# Patient Record
Sex: Female | Born: 1955 | Race: White | Hispanic: No | Smoking: Former smoker
Health system: Southern US, Community
[De-identification: ages and names within clinical notes are randomized; demographics above are authoritative.]

## PROBLEM LIST (undated history)

## (undated) DIAGNOSIS — Z972 Presence of dental prosthetic device (complete) (partial): Secondary | ICD-10-CM

## (undated) DIAGNOSIS — B1710 Acute hepatitis C without hepatic coma: Secondary | ICD-10-CM

## (undated) DIAGNOSIS — L570 Actinic keratosis: Secondary | ICD-10-CM

## (undated) DIAGNOSIS — F329 Major depressive disorder, single episode, unspecified: Secondary | ICD-10-CM

## (undated) DIAGNOSIS — Z8614 Personal history of Methicillin resistant Staphylococcus aureus infection: Secondary | ICD-10-CM

## (undated) DIAGNOSIS — G4700 Insomnia, unspecified: Secondary | ICD-10-CM

## (undated) DIAGNOSIS — F17210 Nicotine dependence, cigarettes, uncomplicated: Secondary | ICD-10-CM

## (undated) DIAGNOSIS — M810 Age-related osteoporosis without current pathological fracture: Secondary | ICD-10-CM

## (undated) DIAGNOSIS — I5189 Other ill-defined heart diseases: Secondary | ICD-10-CM

## (undated) DIAGNOSIS — M1990 Unspecified osteoarthritis, unspecified site: Secondary | ICD-10-CM

## (undated) DIAGNOSIS — Z9889 Other specified postprocedural states: Secondary | ICD-10-CM

## (undated) DIAGNOSIS — G5602 Carpal tunnel syndrome, left upper limb: Secondary | ICD-10-CM

## (undated) DIAGNOSIS — I4510 Unspecified right bundle-branch block: Secondary | ICD-10-CM

## (undated) DIAGNOSIS — R112 Nausea with vomiting, unspecified: Secondary | ICD-10-CM

## (undated) DIAGNOSIS — R413 Other amnesia: Secondary | ICD-10-CM

## (undated) DIAGNOSIS — R251 Tremor, unspecified: Secondary | ICD-10-CM

## (undated) DIAGNOSIS — M1711 Unilateral primary osteoarthritis, right knee: Secondary | ICD-10-CM

## (undated) DIAGNOSIS — E039 Hypothyroidism, unspecified: Secondary | ICD-10-CM

## (undated) DIAGNOSIS — K219 Gastro-esophageal reflux disease without esophagitis: Secondary | ICD-10-CM

## (undated) DIAGNOSIS — F32A Depression, unspecified: Secondary | ICD-10-CM

## (undated) DIAGNOSIS — R0600 Dyspnea, unspecified: Secondary | ICD-10-CM

## (undated) HISTORY — DX: Gastro-esophageal reflux disease without esophagitis: K21.9

## (undated) HISTORY — DX: Hypothyroidism, unspecified: E03.9

## (undated) HISTORY — DX: Insomnia, unspecified: G47.00

## (undated) HISTORY — DX: Depression, unspecified: F32.A

## (undated) HISTORY — DX: Actinic keratosis: L57.0

## (undated) HISTORY — PX: HAND SURGERY: SHX662

## (undated) HISTORY — PX: OTHER SURGICAL HISTORY: SHX169

## (undated) HISTORY — DX: Tremor, unspecified: R25.1

## (undated) HISTORY — PX: TOTAL KNEE ARTHROPLASTY: SHX125

## (undated) HISTORY — DX: Other amnesia: R41.3

## (undated) HISTORY — DX: Age-related osteoporosis without current pathological fracture: M81.0

## (undated) HISTORY — PX: ANKLE SURGERY: SHX546

## (undated) HISTORY — PX: TONSILLECTOMY: SUR1361

## (undated) HISTORY — DX: Major depressive disorder, single episode, unspecified: F32.9

## (undated) HISTORY — DX: Acute hepatitis C without hepatic coma: B17.10

---

## 1983-08-16 DIAGNOSIS — B1920 Unspecified viral hepatitis C without hepatic coma: Secondary | ICD-10-CM

## 1983-08-16 HISTORY — DX: Unspecified viral hepatitis C without hepatic coma: B19.20

## 2004-07-30 ENCOUNTER — Emergency Department: Payer: Self-pay | Admitting: Emergency Medicine

## 2004-08-12 ENCOUNTER — Ambulatory Visit: Payer: Self-pay | Admitting: Gastroenterology

## 2005-01-27 ENCOUNTER — Ambulatory Visit: Payer: Self-pay | Admitting: Unknown Physician Specialty

## 2005-03-02 ENCOUNTER — Encounter: Payer: Self-pay | Admitting: Unknown Physician Specialty

## 2005-03-15 ENCOUNTER — Encounter: Payer: Self-pay | Admitting: Unknown Physician Specialty

## 2005-04-15 ENCOUNTER — Encounter: Payer: Self-pay | Admitting: Unknown Physician Specialty

## 2006-09-15 ENCOUNTER — Emergency Department: Payer: Self-pay | Admitting: Internal Medicine

## 2007-03-19 ENCOUNTER — Ambulatory Visit: Payer: Self-pay | Admitting: Physician Assistant

## 2009-04-07 ENCOUNTER — Ambulatory Visit: Payer: Self-pay | Admitting: Adult Health

## 2010-05-13 ENCOUNTER — Ambulatory Visit: Payer: Self-pay

## 2010-05-26 ENCOUNTER — Ambulatory Visit: Payer: Self-pay

## 2010-07-12 ENCOUNTER — Emergency Department: Payer: Self-pay | Admitting: Emergency Medicine

## 2011-10-16 ENCOUNTER — Emergency Department: Payer: Self-pay | Admitting: Unknown Physician Specialty

## 2011-10-25 ENCOUNTER — Ambulatory Visit: Payer: Self-pay | Admitting: Orthopedic Surgery

## 2011-10-25 LAB — URINALYSIS, COMPLETE
Glucose,UR: NEGATIVE mg/dL (ref 0–75)
Nitrite: NEGATIVE
Protein: NEGATIVE
Specific Gravity: 1.004 (ref 1.003–1.030)

## 2011-10-25 LAB — CBC
MCH: 31.2 pg (ref 26.0–34.0)
MCV: 93 fL (ref 80–100)
Platelet: 222 10*3/uL (ref 150–440)
RBC: 3.75 10*6/uL — ABNORMAL LOW (ref 3.80–5.20)
RDW: 12.5 % (ref 11.5–14.5)
WBC: 7 10*3/uL (ref 3.6–11.0)

## 2011-10-25 LAB — BASIC METABOLIC PANEL
Anion Gap: 7 (ref 7–16)
BUN: 10 mg/dL (ref 7–18)
Calcium, Total: 8.9 mg/dL (ref 8.5–10.1)
Co2: 31 mmol/L (ref 21–32)
EGFR (African American): >60
Potassium: 4.2 mmol/L (ref 3.5–5.1)
Sodium: 139 mmol/L (ref 136–145)

## 2011-10-25 LAB — PROTIME-INR
INR: 0.9
Prothrombin Time: 12.6 secs (ref 11.5–14.7)

## 2011-10-25 LAB — SEDIMENTATION RATE: Erythrocyte Sed Rate: 21 mm/hr (ref 0–30)

## 2011-10-25 LAB — APTT: Activated PTT: 30.7 secs (ref 23.6–35.9)

## 2011-10-26 ENCOUNTER — Ambulatory Visit: Payer: Self-pay | Admitting: Orthopedic Surgery

## 2011-10-28 ENCOUNTER — Observation Stay: Payer: Self-pay | Admitting: Orthopedic Surgery

## 2011-12-18 ENCOUNTER — Emergency Department: Payer: Self-pay | Admitting: Emergency Medicine

## 2012-01-03 ENCOUNTER — Ambulatory Visit: Payer: Self-pay | Admitting: Family Medicine

## 2012-01-10 ENCOUNTER — Encounter: Payer: Self-pay | Admitting: Cardiothoracic Surgery

## 2012-01-10 ENCOUNTER — Encounter: Payer: Self-pay | Admitting: Nurse Practitioner

## 2012-01-28 ENCOUNTER — Emergency Department: Payer: Self-pay | Admitting: Internal Medicine

## 2012-01-28 ENCOUNTER — Emergency Department: Payer: Self-pay | Admitting: Unknown Physician Specialty

## 2012-01-29 LAB — COMPREHENSIVE METABOLIC PANEL
Alkaline Phosphatase: 111 U/L (ref 50–136)
BUN: 16 mg/dL (ref 7–18)
Bilirubin,Total: 0.2 mg/dL (ref 0.2–1.0)
Chloride: 105 mmol/L (ref 98–107)
Co2: 28 mmol/L (ref 21–32)
Creatinine: 0.7 mg/dL (ref 0.60–1.30)
EGFR (African American): >60
Glucose: 101 mg/dL — ABNORMAL HIGH (ref 65–99)
SGPT (ALT): 21 U/L

## 2012-01-29 LAB — CBC WITH DIFFERENTIAL/PLATELET
Basophil #: 0 10*3/uL (ref 0.0–0.1)
Basophil %: 0.3 %
Eosinophil %: 1.9 %
HCT: 35.6 % (ref 35.0–47.0)
HGB: 11.7 g/dL — ABNORMAL LOW (ref 12.0–16.0)
Lymphocyte #: 2.2 10*3/uL (ref 1.0–3.6)
Monocyte #: 0.6 x10 3/mm (ref 0.2–0.9)
Monocyte %: 9 %
Neutrophil %: 57.4 %
RBC: 3.85 10*6/uL (ref 3.80–5.20)
RDW: 13.5 % (ref 11.5–14.5)
WBC: 7 10*3/uL (ref 3.6–11.0)

## 2012-01-31 ENCOUNTER — Ambulatory Visit: Payer: Self-pay | Admitting: Orthopedic Surgery

## 2012-01-31 LAB — URINALYSIS, COMPLETE
Bilirubin,UR: NEGATIVE
Blood: NEGATIVE
Glucose,UR: NEGATIVE mg/dL (ref 0–75)
Leukocyte Esterase: NEGATIVE
Ph: 7 (ref 4.5–8.0)
RBC,UR: <1 /HPF (ref 0–5)
Specific Gravity: 1.019 (ref 1.003–1.030)
Squamous Epithelial: 4

## 2012-01-31 LAB — CBC
HCT: 34.8 % — ABNORMAL LOW (ref 35.0–47.0)
MCH: 30.2 pg (ref 26.0–34.0)
WBC: 4.1 10*3/uL (ref 3.6–11.0)

## 2012-01-31 LAB — BASIC METABOLIC PANEL
Anion Gap: 6 — ABNORMAL LOW (ref 7–16)
Calcium, Total: 9 mg/dL (ref 8.5–10.1)
Chloride: 102 mmol/L (ref 98–107)
Co2: 29 mmol/L (ref 21–32)
EGFR (Non-African Amer.): >60
Glucose: 85 mg/dL (ref 65–99)

## 2012-01-31 LAB — PROTIME-INR: Prothrombin Time: 13 secs (ref 11.5–14.7)

## 2012-02-01 ENCOUNTER — Ambulatory Visit: Payer: Self-pay | Admitting: Orthopedic Surgery

## 2012-02-02 LAB — WOUND CULTURE

## 2012-02-03 LAB — CULTURE, BLOOD (SINGLE)

## 2012-02-17 ENCOUNTER — Inpatient Hospital Stay: Payer: Self-pay | Admitting: Orthopedic Surgery

## 2012-02-17 LAB — CBC WITH DIFFERENTIAL/PLATELET
Basophil #: 0 10*3/uL (ref 0.0–0.1)
Eosinophil #: 0.1 10*3/uL (ref 0.0–0.7)
HCT: 32.1 % — ABNORMAL LOW (ref 35.0–47.0)
Lymphocyte #: 2.8 10*3/uL (ref 1.0–3.6)
MCH: 29.9 pg (ref 26.0–34.0)
MCV: 90 fL (ref 80–100)
Monocyte #: 0.5 x10 3/mm (ref 0.2–0.9)
Monocyte %: 8.1 %
Neutrophil #: 3.1 10*3/uL (ref 1.4–6.5)
Neutrophil %: 47.4 %
Platelet: 302 10*3/uL (ref 150–440)
RDW: 13.7 % (ref 11.5–14.5)
WBC: 6.6 10*3/uL (ref 3.6–11.0)

## 2012-02-17 LAB — SEDIMENTATION RATE: Erythrocyte Sed Rate: 85 mm/hr — ABNORMAL HIGH (ref 0–30)

## 2012-02-17 LAB — BASIC METABOLIC PANEL
BUN: 13 mg/dL (ref 7–18)
Chloride: 106 mmol/L (ref 98–107)
EGFR (African American): >60
EGFR (Non-African Amer.): >60
Glucose: 91 mg/dL (ref 65–99)
Osmolality: 277 (ref 275–301)

## 2012-02-19 LAB — CBC WITH DIFFERENTIAL/PLATELET
Basophil %: 0.6 %
Eosinophil %: 5.1 %
HCT: 33.2 % — ABNORMAL LOW (ref 35.0–47.0)
HGB: 10.9 g/dL — ABNORMAL LOW (ref 12.0–16.0)
Lymphocyte #: 2.3 10*3/uL (ref 1.0–3.6)
MCH: 29.7 pg (ref 26.0–34.0)
MCV: 90 fL (ref 80–100)
Monocyte #: 0.5 x10 3/mm (ref 0.2–0.9)
Monocyte %: 9.7 %
Neutrophil #: 1.9 10*3/uL (ref 1.4–6.5)
Platelet: 271 10*3/uL (ref 150–440)
RBC: 3.67 10*6/uL — ABNORMAL LOW (ref 3.80–5.20)
RDW: 13.6 % (ref 11.5–14.5)
WBC: 4.9 10*3/uL (ref 3.6–11.0)

## 2012-02-19 LAB — VANCOMYCIN, TROUGH: Vancomycin, Trough: 24 ug/mL (ref 10–20)

## 2012-03-15 ENCOUNTER — Other Ambulatory Visit: Payer: Self-pay | Admitting: Orthopedic Surgery

## 2012-03-15 LAB — SEDIMENTATION RATE: Erythrocyte Sed Rate: 41 mm/hr — ABNORMAL HIGH (ref 0–30)

## 2012-03-15 LAB — CBC WITH DIFFERENTIAL/PLATELET
Basophil #: 0 10*3/uL (ref 0.0–0.1)
Eosinophil %: 4.2 %
HGB: 11.7 g/dL — ABNORMAL LOW (ref 12.0–16.0)
Lymphocyte #: 2.1 10*3/uL (ref 1.0–3.6)
MCH: 30.3 pg (ref 26.0–34.0)
Monocyte #: 0.6 x10 3/mm (ref 0.2–0.9)
Monocyte %: 9.3 %
Neutrophil %: 53.6 %
Platelet: 257 10*3/uL (ref 150–440)
RBC: 3.86 10*6/uL (ref 3.80–5.20)
RDW: 14.4 % (ref 11.5–14.5)
WBC: 6.5 10*3/uL (ref 3.6–11.0)

## 2012-05-01 ENCOUNTER — Ambulatory Visit: Payer: Self-pay | Admitting: Orthopedic Surgery

## 2012-05-01 LAB — CBC
HCT: 37.5 % (ref 35.0–47.0)
HGB: 12.7 g/dL (ref 12.0–16.0)
MCH: 30.5 pg (ref 26.0–34.0)
MCHC: 33.8 g/dL (ref 32.0–36.0)
Platelet: 266 10*3/uL (ref 150–440)
WBC: 6.4 10*3/uL (ref 3.6–11.0)

## 2012-05-01 LAB — BASIC METABOLIC PANEL
Anion Gap: 7 (ref 7–16)
BUN: 15 mg/dL (ref 7–18)
Calcium, Total: 8.9 mg/dL (ref 8.5–10.1)
Chloride: 106 mmol/L (ref 98–107)
Glucose: 108 mg/dL — ABNORMAL HIGH (ref 65–99)
Osmolality: 277 (ref 275–301)
Potassium: 4.2 mmol/L (ref 3.5–5.1)
Sodium: 138 mmol/L (ref 136–145)

## 2012-05-01 LAB — APTT: Activated PTT: 32.5 secs (ref 23.6–35.9)

## 2012-05-01 LAB — PROTIME-INR: Prothrombin Time: 13.1 secs (ref 11.5–14.7)

## 2012-05-01 LAB — SEDIMENTATION RATE: Erythrocyte Sed Rate: 36 mm/hr — ABNORMAL HIGH (ref 0–30)

## 2012-05-04 ENCOUNTER — Inpatient Hospital Stay: Payer: Self-pay | Admitting: Orthopedic Surgery

## 2012-05-05 LAB — SEDIMENTATION RATE: Erythrocyte Sed Rate: 31 mm/hr — ABNORMAL HIGH (ref 0–30)

## 2012-05-07 LAB — CREATININE, SERUM
EGFR (African American): >60
EGFR (Non-African Amer.): >60

## 2012-05-08 LAB — CBC WITH DIFFERENTIAL/PLATELET
Basophil %: 0.6 %
Eosinophil #: 0.5 10*3/uL (ref 0.0–0.7)
Eosinophil %: 9.7 %
HCT: 30.9 % — ABNORMAL LOW (ref 35.0–47.0)
HGB: 10.6 g/dL — ABNORMAL LOW (ref 12.0–16.0)
Lymphocyte %: 41 %
MCHC: 34.2 g/dL (ref 32.0–36.0)
MCV: 90 fL (ref 80–100)
Neutrophil #: 2.2 10*3/uL (ref 1.4–6.5)
Neutrophil %: 39.7 %
RBC: 3.45 10*6/uL — ABNORMAL LOW (ref 3.80–5.20)

## 2012-05-08 LAB — WOUND CULTURE

## 2012-05-28 ENCOUNTER — Ambulatory Visit: Payer: Self-pay | Admitting: Specialist

## 2012-06-12 ENCOUNTER — Other Ambulatory Visit: Payer: Self-pay | Admitting: Orthopedic Surgery

## 2012-06-21 ENCOUNTER — Encounter: Payer: Self-pay | Admitting: Nurse Practitioner

## 2012-06-21 ENCOUNTER — Encounter: Payer: Self-pay | Admitting: Cardiothoracic Surgery

## 2012-07-15 ENCOUNTER — Encounter: Payer: Self-pay | Admitting: Nurse Practitioner

## 2012-07-15 ENCOUNTER — Encounter: Payer: Self-pay | Admitting: Cardiothoracic Surgery

## 2012-07-27 ENCOUNTER — Ambulatory Visit: Payer: Self-pay

## 2012-08-15 ENCOUNTER — Encounter: Payer: Self-pay | Admitting: Cardiothoracic Surgery

## 2012-08-15 ENCOUNTER — Encounter: Payer: Self-pay | Admitting: Nurse Practitioner

## 2013-01-09 IMAGING — NM NUCLEAR MEDICINE THREE PHASE BONE SCAN
1 of 2 series · 2 of 14 positions shown, 3 images · non-contrast
Comparison: none

REASON FOR EXAM: part 2
COMMENTS:

[Series 3: ac bone 5.0 b08s · axial · 0.98mm/px · z∈[+202,+572]mm · 2 of 75 slices shown, 3 images]
[im 1/75  soft-tissue]
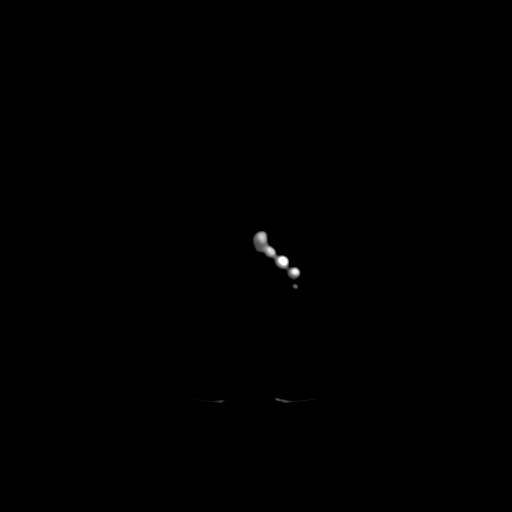
[im 1/75  bone]
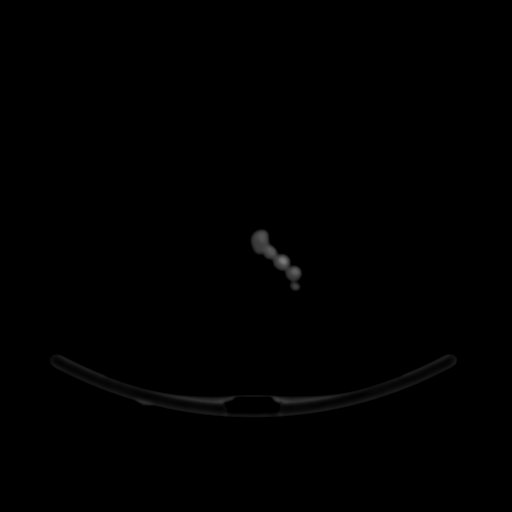
[im 75/75  bone]
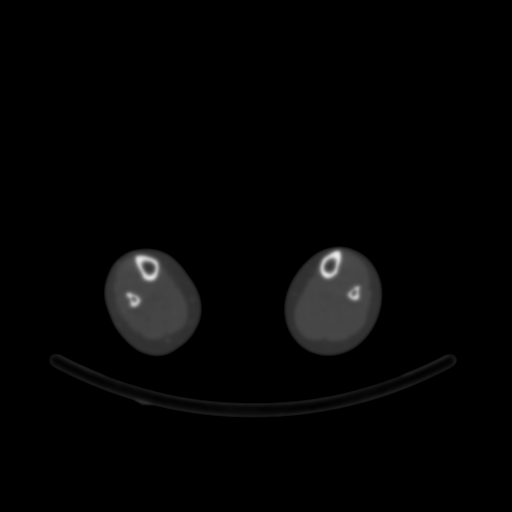

[2 of 14 positions shown; findings below may reference images not displayed]

PROCEDURE:     NM  - NM BONE IMAGING 3 PHASE STUDY  - February 18, 2012  [DATE]

RESULT:     Clinical History: Right ankle infection

Procedure: Following the intravenous administration of 21.607 mCi of Tc 99m
MDP, dynamic flow, blood pool, and three hour delayed images of bilateral
feet were obtained.
FINDINGS: There is increased radiotracer uptake in the soft tissues and
bone during the flow phase within the right ankle. There is increased
osseous radiotracer uptake within the distal right fibula and right tibia
during the flow phase. On the delayed images there is intense radiotracer
uptake focally within the distal fibular diaphysis which is the site of a
known fracture. There is increased rate tracer uptake within the distal
tibia which is the site of known fracture. There is orthopedic hardware
present along the lateral aspect of the distal fibula and the medial
malleolus.
IMPRESSION: There is increased radiotracer uptake within the soft tissues of the right
ankle as can be seen with cellulitis. Correlate with clinical exam.

There is increased radiotracer uptake focally within the distal fibular
diaphysis and within the distal tibia and medial malleolus which are sites
of fractures status post internal fixation. The increased radiotracer uptake
may be secondary to the fracture itself, but superimposed infection cannot
be entirely excluded. If there is further clinical concern a tagged white
blood cell study is recommended.

[REDACTED]

## 2013-07-08 ENCOUNTER — Ambulatory Visit: Payer: Self-pay | Admitting: Family Medicine

## 2013-07-25 ENCOUNTER — Encounter: Payer: Self-pay | Admitting: Podiatry

## 2013-07-29 ENCOUNTER — Ambulatory Visit (INDEPENDENT_AMBULATORY_CARE_PROVIDER_SITE_OTHER): Payer: BC Managed Care – PPO

## 2013-07-29 ENCOUNTER — Encounter: Payer: Self-pay | Admitting: Podiatry

## 2013-07-29 ENCOUNTER — Ambulatory Visit (INDEPENDENT_AMBULATORY_CARE_PROVIDER_SITE_OTHER): Payer: BC Managed Care – PPO | Admitting: Podiatry

## 2013-07-29 VITALS — BP 112/70 | HR 106 | Resp 16 | Ht 66.0 in | Wt 175.0 lb

## 2013-07-29 DIAGNOSIS — M79672 Pain in left foot: Secondary | ICD-10-CM

## 2013-07-29 DIAGNOSIS — M7750 Other enthesopathy of unspecified foot: Secondary | ICD-10-CM

## 2013-07-29 DIAGNOSIS — M79609 Pain in unspecified limb: Secondary | ICD-10-CM

## 2013-07-29 DIAGNOSIS — M109 Gout, unspecified: Secondary | ICD-10-CM

## 2013-07-29 MED ORDER — METHYLPREDNISOLONE (PAK) 4 MG PO TABS: ORAL_TABLET | ORAL | Status: DC

## 2013-07-29 NOTE — Progress Notes (Signed)
   Subjective:    Patient ID: [name] [name], female    DOB: 1956/07/02, 57 y.o.   MRN: [id number]  HPI Comments:  N PAIN/SWELLING L LEFT FOOT -ARCH/ANKLE INTO LEG D 3 WEEKS O SUDDEN C SAME A AFTER SITTING T HAD BLOOD WORK FOR GOUT-NEGATIVE, X-RAYS, CORTISONE INJ-DR MILES   Foot Pain      Review of Systems  Constitutional: Negative.   HENT:       SINUS PROBLEMS  Eyes: Positive for visual disturbance.  Cardiovascular: Negative.   Gastrointestinal: Negative.   Endocrine: Negative.   Genitourinary: Positive for urgency.  Musculoskeletal:       JOINT PAIN  Skin: Negative.   Allergic/Immunologic: Negative.   Neurological: Negative.   Hematological: Negative.   Psychiatric/Behavioral: Negative.        Objective:   Physical Exam: I have reviewed her past medical history medications allergies surgical history social history family history and review of systems. Vital signs are stable she is alert and oriented x3. Pulses are strongly palpable bilateral. Neurologic sensorium is intact per Semmes-Weinstein monofilament. Deep tendon reflexes are brisk and equal bilateral. Muscle strength + over 5 dorsiflexors plantar flexors inverters everters all intrinsic musculature is intact. Orthopedic evaluation demonstrates all joints distal to the ankle a full range of motion without crepitus she has mild pes planus left foot with overlying edema no erythema cellulitis drainage or odor she has pain on palpation with a boggy edema overlying the sinus tarsi and lateral ankle tenderness is also noted here as well as the medial ankle along the posterior tibial tendon. Radiographically I am unable to visualize any fractures at this point. No osteopenia is present and the fracture would not be out of the question. The foot is warm and swollen to touch not consistent with simple fracture particularly this far out. Cutaneous evaluation demonstrates supple well hydrated cutis.        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  Plan: I started her on prednisone today. I put her in a compression anklet and a Cam Walker boot which alleviate her symptomatology immediately.

## 2013-08-01 ENCOUNTER — Telehealth: Payer: Self-pay | Admitting: *Deleted

## 2013-08-01 NOTE — Telephone Encounter (Signed)
Pt called wanting to know if she should stop taking aspirin due to her gout. Per dr. Al Corpus yes stop taking aspirin. L/m on pts v/mail stating this.

## 2013-08-05 ENCOUNTER — Telehealth: Payer: Self-pay | Admitting: *Deleted

## 2013-08-05 NOTE — Telephone Encounter (Signed)
Pt called said her foot was still swollen and was hurting wanted to know if she needed to cont. Wearing the boot. L/m stating to wear boot and to call to make appt if still in pain.

## 2013-08-12 ENCOUNTER — Encounter: Payer: Self-pay | Admitting: Podiatry

## 2013-08-12 ENCOUNTER — Ambulatory Visit (INDEPENDENT_AMBULATORY_CARE_PROVIDER_SITE_OTHER): Payer: BC Managed Care – PPO | Admitting: Podiatry

## 2013-08-12 VITALS — BP 111/73 | HR 108 | Resp 16 | Ht 66.0 in | Wt 170.0 lb

## 2013-08-12 DIAGNOSIS — M7750 Other enthesopathy of unspecified foot: Secondary | ICD-10-CM

## 2013-08-12 DIAGNOSIS — M778 Other enthesopathies, not elsewhere classified: Secondary | ICD-10-CM

## 2013-08-12 NOTE — Progress Notes (Signed)
She presents today states that her foot is doing about the same really hasn't gotten any better. She's been wearing her compression anklet took her anti-inflammatories and has been wearing her Cam Walker. She denies fever chills nausea vomiting muscle aches or pains.  Objective: Vital signs are stable she is alert and oriented x3 no erythema does mild edema no cellulitis drainage or odor she does have pain on palpation of the sinus tarsi and on range of motion of the subtalar joint particularly odor sharp inversion.  Assessment: Subtalar joint capsulitis left foot.  Plan: Continue all conservative therapies. We did inject the subtalar joint today with Kenalog and local anesthetic followup with her in one month at which time if she is no better an MRI will be indicated.

## 2013-09-02 ENCOUNTER — Ambulatory Visit (INDEPENDENT_AMBULATORY_CARE_PROVIDER_SITE_OTHER): Payer: No Typology Code available for payment source | Admitting: Podiatry

## 2013-09-02 ENCOUNTER — Encounter: Payer: Self-pay | Admitting: Podiatry

## 2013-09-02 VITALS — BP 136/89 | HR 84 | Resp 18

## 2013-09-02 DIAGNOSIS — M779 Enthesopathy, unspecified: Principal | ICD-10-CM

## 2013-09-02 DIAGNOSIS — M778 Other enthesopathies, not elsewhere classified: Secondary | ICD-10-CM

## 2013-09-02 DIAGNOSIS — M7750 Other enthesopathy of unspecified foot: Secondary | ICD-10-CM

## 2013-09-02 DIAGNOSIS — M109 Gout, unspecified: Secondary | ICD-10-CM

## 2013-09-02 MED ORDER — METHYLPREDNISOLONE (PAK) 4 MG PO TABS: ORAL_TABLET | ORAL | Status: DC

## 2013-09-02 NOTE — Progress Notes (Signed)
Two days of relief after the cortisone injection than right back to ground zero.  Objective: Vital signs are stable she is alert and oriented x3. Pain on palpation to the extensor digitorum brevis muscle belly which is warm on palpation.  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  Plan: Discussed etiology pathology conservative surgical therapies at this point we're going to have blood work drawn looking for an elevated uric acid and I injected her periarticularly about the fourth fifth metatarsocuboid articulation. And wrote a prescription for a Medrol Dosepak.

## 2013-09-03 ENCOUNTER — Telehealth: Payer: Self-pay | Admitting: *Deleted

## 2013-09-03 LAB — SEDIMENTATION RATE: Sed Rate: 2 mm/hr (ref 0–40)

## 2013-09-03 LAB — URIC ACID: Uric Acid: 2.7 mg/dL (ref 2.5–7.1)

## 2013-09-03 LAB — ANA: Anti Nuclear Antibody(ANA): NEGATIVE

## 2013-09-03 LAB — C-REACTIVE PROTEIN: CRP: 0.7 mg/L (ref 0.0–4.9)

## 2013-09-03 LAB — RHEUMATOID FACTOR: Rhuematoid fact SerPl-aCnc: 9.4 IU/mL (ref 0.0–13.9)

## 2013-09-03 NOTE — Telephone Encounter (Signed)
Left message about her bloodwork being normal to call with any questions

## 2013-09-09 ENCOUNTER — Telehealth: Payer: Self-pay | Admitting: *Deleted

## 2013-09-09 ENCOUNTER — Other Ambulatory Visit: Payer: Self-pay | Admitting: Podiatry

## 2013-09-09 DIAGNOSIS — M109 Gout, unspecified: Secondary | ICD-10-CM

## 2013-09-09 MED ORDER — INDOMETHACIN 50 MG PO CAPS: 50.0000 mg | ORAL_CAPSULE | Freq: Two times a day (BID) | ORAL | Status: DC

## 2013-09-09 NOTE — Telephone Encounter (Signed)
Pt called stating she has gout in both feet/toes. Wants a rx for allopurinol or something.

## 2013-09-09 NOTE — Telephone Encounter (Signed)
Sakara, Lehtinen Female [birth year] [id number] [place] [id number] [phone] (Home)              Message Received: Today     [place]plan[place], DPM Dennie Fetters            I have ordered indocin and is at the pharm. If the pain just started with in the last couple of days then I suggest blood work for uric acid. Last uric acid was low thus not gout. If we can catch it high then we could start allopurinol.      Per dr Milinda Pointer send pt out for uric acid. Indomethacin was sent over to pharmacy

## 2013-09-10 LAB — URIC ACID: URIC ACID: 2.8 mg/dL (ref 2.5–7.1)

## 2013-09-11 ENCOUNTER — Telehealth: Payer: Self-pay | Admitting: *Deleted

## 2013-09-11 NOTE — Telephone Encounter (Signed)
Pt called wanting to know if she could start taking her mobic and aspirin again. Per dr Milinda Pointer yes can start back taking those rx. Also i asked pt to call me back regarding appt with dr Precious Reel to let me know a good day/time to set up her appt.

## 2013-09-11 NOTE — Telephone Encounter (Signed)
Message copied by Laury Axon on Wed Lue 28, 2015 11:39 AM ------      Message from: Tyson Dense T      Created: Tue Kentrell 27, 2015  1:18 PM       Inform [name] that this is obviously not gout with a uric acid reading of 2.8.  I think she needs to see a rheumatologist.  See about sending her to Dr. Maylon Peppers. ------

## 2013-09-11 NOTE — Telephone Encounter (Signed)
Called and left message asking pt to call me back regarding uric acid. i stated labs came back and no gout, dr Milinda Pointer wants to send her to rheumatology to see dr Aletta Edouard. Asked if she would call me back to let me know a good day and time for her so i can set up appt.

## 2013-09-16 ENCOUNTER — Ambulatory Visit: Payer: No Typology Code available for payment source | Admitting: Podiatry

## 2013-09-30 ENCOUNTER — Ambulatory Visit: Payer: No Typology Code available for payment source | Admitting: Podiatry

## 2013-11-12 ENCOUNTER — Emergency Department (HOSPITAL_COMMUNITY)
Admission: EM | Admit: 2013-11-12 | Discharge: 2013-11-12 | Disposition: A | Discharge status: Home / Self Care | Payer: No Typology Code available for payment source | Attending: Emergency Medicine | Admitting: Emergency Medicine

## 2013-11-12 ENCOUNTER — Encounter (HOSPITAL_COMMUNITY): Payer: Self-pay | Admitting: Emergency Medicine

## 2013-11-12 DIAGNOSIS — R5383 Other fatigue: Secondary | ICD-10-CM

## 2013-11-12 DIAGNOSIS — B1920 Unspecified viral hepatitis C without hepatic coma: Secondary | ICD-10-CM | POA: Insufficient documentation

## 2013-11-12 DIAGNOSIS — K219 Gastro-esophageal reflux disease without esophagitis: Secondary | ICD-10-CM | POA: Insufficient documentation

## 2013-11-12 DIAGNOSIS — F329 Major depressive disorder, single episode, unspecified: Secondary | ICD-10-CM | POA: Insufficient documentation

## 2013-11-12 DIAGNOSIS — R5381 Other malaise: Secondary | ICD-10-CM | POA: Insufficient documentation

## 2013-11-12 DIAGNOSIS — Z79899 Other long term (current) drug therapy: Secondary | ICD-10-CM | POA: Insufficient documentation

## 2013-11-12 DIAGNOSIS — R002 Palpitations: Secondary | ICD-10-CM | POA: Insufficient documentation

## 2013-11-12 DIAGNOSIS — R000 Tachycardia, unspecified: Secondary | ICD-10-CM | POA: Insufficient documentation

## 2013-11-12 DIAGNOSIS — F3289 Other specified depressive episodes: Secondary | ICD-10-CM | POA: Insufficient documentation

## 2013-11-12 DIAGNOSIS — Z87891 Personal history of nicotine dependence: Secondary | ICD-10-CM | POA: Insufficient documentation

## 2013-11-12 DIAGNOSIS — R112 Nausea with vomiting, unspecified: Secondary | ICD-10-CM | POA: Insufficient documentation

## 2013-11-12 DIAGNOSIS — R42 Dizziness and giddiness: Secondary | ICD-10-CM | POA: Insufficient documentation

## 2013-11-12 LAB — I-STAT CHEM 8, ED
BUN: 10 mg/dL (ref 6–23)
CALCIUM ION: 1.19 mmol/L (ref 1.12–1.23)
Chloride: 103 mEq/L (ref 96–112)
Creatinine, Ser: 0.9 mg/dL (ref 0.50–1.10)
Glucose, Bld: 105 mg/dL — ABNORMAL HIGH (ref 70–99)
HEMATOCRIT: 41 % (ref 36.0–46.0)
Hemoglobin: 13.9 g/dL (ref 12.0–15.0)
POTASSIUM: 4.4 meq/L (ref 3.7–5.3)
Sodium: 143 mEq/L (ref 137–147)
TCO2: 27 mmol/L (ref 0–100)

## 2013-11-12 MED ORDER — ACETAMINOPHEN 325 MG PO TABS
650.0000 mg | ORAL_TABLET | Freq: Once | ORAL | Status: AC
  Administered 2013-11-12: 650 mg via ORAL
  Filled 2013-11-12: qty 2

## 2013-11-12 NOTE — ED Notes (Signed)
Per [facility] pt lives at home but called 911 from home. Pt called 911 due to "fast heart rate." Pt stated to EMS she never drinks anything but coffee, pt denies any pain. Pt reports feeling nauseated, vomited x1, and dizziness, pt had a HR of 129, BP 134/90, no change with OVS. 20 G LAC, pt received 250 CC NS administered.

## 2013-11-12 NOTE — ED Notes (Signed)
Pt st's she started feeling nauseated while at work.  She then checked her B/P which was 145/83.  Pt st's she felt like her heart was racing but did not have any chest pain.  At this time pt st's she just feels tired and slightly nauseated.  Pt alert and oriented x 3, skin warm and dry color appropriate.  Pt also c/o lower back pain, denies injury.

## 2013-11-13 NOTE — ED Provider Notes (Signed)
I saw and evaluated the patient, reviewed the resident's note and I agree with the findings and plan.   EKG Interpretation   Date/Time:  Tuesday November 12 2013 17:23:31 EDT Ventricular Rate:  103 PR Interval:  143 QRS Duration: 84 QT Interval:  348 QTC Calculation: 455 R Axis:   51 Text Interpretation:  Sinus tachycardia Probable left atrial enlargement  Low voltage, precordial leads Probable anteroseptal infarct, old No old  tracing to compare Confirmed by Seana Underwood  MD, [place]uto [place] ([id number]) on 11/12/2013  7:43:53 PM       Palpitations likely from her stimulant use. No significant abnormality on labs. HR improved here. D/C.  Ephraim Hamburger, MD 11/13/13 1420

## 2013-11-13 NOTE — ED Provider Notes (Signed)
CSN: [id number]     Arrival date & time 11/12/13  1703 History   First MD Initiated Contact with Patient 11/12/13 1707     Chief Complaint  Patient presents with  . Palpitations    Patient is a 58 y.o. female presenting with palpitations.  Palpitations Palpitations quality:  Fast Onset quality:  Sudden Timing:  Constant Progression:  Resolved (resolved spontaneously while in ambulance on way to hospital) Chronicity:  New Context: caffeine (she reports that she drinks at least 1 pot of coffee daily.  she had about 4-5 cups of coffee this morning just before onset of her symptoms.)   Context: not bronchodilators, not exercise, not illicit drugs and not nicotine   Relieved by:  Nothing Exacerbated by: nothing. Ineffective treatments:  None tried Associated symptoms: dizziness ("lightheaded"), malaise/fatigue, nausea and vomiting (X1, non-bloody, non-bilious)   Associated symptoms: no back pain, no chest pain, no cough, no diaphoresis, no leg pain, no lower extremity edema, no near-syncope, no numbness, no orthopnea, no PND, no shortness of breath and no syncope   Nausea:    Severity:  Mild   Progression:  Resolved Risk factors: hx of thyroid disease (she last had her thyroid function tests checked by her PCP 3 months ago and was told they were normal.  she has been taking her levothyroxine as prescribed.) and hyperthyroidism (remotely, now hypothyroid.)   Risk factors: no diabetes mellitus, no heart disease, no hx of atrial fibrillation, no hx of DVT and no hx of PE   Risk factors comment:  She has never had SVT or other arrhythmia.  she has never had palpitations, syncope, CHF, MI, or other cardiac disease.   Past Medical History  Diagnosis Date  . Depression   . Insomnia   . Acute hepatitis C without mention of hepatic coma   . GERD (gastroesophageal reflux disease)    Past Surgical History  Procedure Laterality Date  . Ankle surgery Right   . Bladder tact    . Tonsillectomy     . Hand surgery Right    No family history on file. History  Substance Use Topics  . Smoking status: Former Research scientist (life sciences)  . Smokeless tobacco: Never Used  . Alcohol Use: No   OB History   Grav Para Term Preterm Abortions TAB SAB Ect Mult Living                 Review of Systems  Constitutional: Positive for malaise/fatigue and fatigue. Negative for fever, chills and diaphoresis.  HENT: Negative for congestion and rhinorrhea.   Respiratory: Negative for cough, shortness of breath and wheezing.   Cardiovascular: Positive for palpitations. Negative for chest pain, orthopnea, leg swelling, syncope, PND and near-syncope.  Gastrointestinal: Positive for nausea and vomiting (X1, non-bloody, non-bilious). Negative for abdominal pain and diarrhea.  Genitourinary: Negative for dysuria, urgency, frequency, flank pain, vaginal bleeding, vaginal discharge and difficulty urinating.  Musculoskeletal: Negative for back pain, neck pain and neck stiffness.  Skin: Negative for rash.  Neurological: Positive for dizziness ("lightheaded"). Negative for weakness, numbness and headaches.  All other systems reviewed and are negative.      Allergies  Sonata  Home Medications   Current Outpatient Rx  Name  Route  Sig  Dispense  Refill  . amitriptyline (ELAVIL) 50 MG tablet   Oral   Take 50 mg by mouth. 3 by mouth nightly at bedtime         . buPROPion (WELLBUTRIN SR) 150 MG 12 hr tablet  Oral   Take 150 mg by mouth 2 (two) times daily.          . calcium carbonate (OS-CAL) 600 MG TABS tablet   Oral   Take 600 mg by mouth 2 (two) times daily with a meal.         . docusate sodium (COLACE) 100 MG capsule   Oral   Take 100 mg by mouth 2 (two) times daily. 1 by mouth TID         . ibandronate (BONIVA) 150 MG tablet   Oral   Take 150 mg by mouth every 30 (thirty) days. Take in the morning with a full glass of water, on an empty stomach, and do not take anything else by mouth or lie down  for the next 30 min.         Marland Kitchen levothyroxine (SYNTHROID, LEVOTHROID) 50 MCG tablet   Oral   Take 50 mcg by mouth daily before breakfast.         . Multiple Vitamins-Minerals (MULTIVITAL-M) TABS   Oral   Take by mouth once.         Marland Kitchen omeprazole (PRILOSEC) 20 MG capsule   Oral   Take 20 mg by mouth daily.         Marland Kitchen tretinoin (RETIN-A) 0.05 % cream   Topical   Apply 0.05 % topically at bedtime.         Marland Kitchen venlafaxine (EFFEXOR) 75 MG tablet   Oral   Take 75 mg by mouth 3 (three) times daily with meals.          BP 131/91  Pulse 106  Temp(Src) 98.2 F (36.8 C) (Oral)  Resp 24  SpO2 97% Physical Exam  Nursing note and vitals reviewed. Constitutional: She is oriented to person, place, and time. She appears well-developed and well-nourished. No distress.  Well appearing.  No stigmata of Graves disease.  HENT:  Head: Normocephalic and atraumatic.  Mouth/Throat: Oropharynx is clear and moist.  Eyes: Conjunctivae and EOM are normal. Pupils are equal, round, and reactive to light. No scleral icterus.  No exophthalmos  Neck: Normal range of motion. Neck supple. No thyromegaly present.  Cardiovascular: Regular rhythm, normal heart sounds and intact distal pulses.  Exam reveals no gallop and no friction rub.   No murmur heard. Borderline tachycardia, 90s to low 100s.  Pulmonary/Chest: Effort normal and breath sounds normal. No respiratory distress. She has no wheezes. She has no rales.  Abdominal: Soft. Bowel sounds are normal. She exhibits no distension. There is no tenderness. There is no rebound and no guarding.  Musculoskeletal: She exhibits no edema.  Neurological: She is alert and oriented to person, place, and time. She has normal reflexes. No cranial nerve deficit. She exhibits normal muscle tone. Coordination normal.  Skin: Skin is warm and dry. No rash noted. She is not diaphoretic.    ED Course  Procedures (including critical care time) Labs Review Labs  Reviewed  I-STAT CHEM 8, ED - Abnormal; Notable for the following:    Glucose, Bld 105 (*)    All other components within normal limits   Imaging Review No results found.   EKG Interpretation   Date/Time:  Tuesday November 12 2013 17:23:31 EDT Ventricular Rate:  103 PR Interval:  143 QRS Duration: 84 QT Interval:  348 QTC Calculation: 455 R Axis:   51 Text Interpretation:  Sinus tachycardia Probable left atrial enlargement  Low voltage, precordial leads Probable anteroseptal infarct, old No old  tracing to compare Confirmed by GOLDSTON  MD, [place] [phone]) on 11/12/2013  7:43:53 PM      MDM   58 year old female with no cardiac history presents complaining of palpitations. Started several hours go home. She states that her heart started to race very fast. She felt a little bit nauseated. She had one episode of nonbloody nonbilious emesis. She did not have any chest pain. She denied any syncope. She took her pulse at home and found it to be in the 140s. She called EMS. EMS found her heart rate to be in the 120s with sinus tachycardia.  Patient has never had similar symptoms before.  In the emergency department, she is afebrile, well-appearing, has mild tachycardia to 100 at presentation with improvement to the 90s with no intervention. She had no arrhythmia on cardiac monitoring. She was monitored in the ED for a period of time with no further symptoms. On further history she tells me that she drinks a pot of coffee every day. She had 4 cups of coffee prior to her symptoms today. She denies nicotine use. Denies any illicit drug use or stimulant use other than the caffeine. She's never had a failed. She's never had SVT. She has no history of congestive heart failure. She's never had syncope. She has had no chest pain with this event today. She has a normal cardiopulmonary exam. No peripheral edema. She has a history of thyroid disorder, had her third function test checked 3 months ago and they  were normal.  EKG here demonstrates sinus tachycardia, normal PR, QRS, QTC. No WPW. No bruit. No ischemic changes.  Chem-8 demonstrates normal creatinine, normal electrolytes, normal hemoglobin.  The patient is stable for discharge. I think that her palpitations are most likely related to a very large amount of caffeine consumption. I advised her to minimize caffeine consumption. She is to follow up with her primary care physician to discuss further. She's given emergency Department return precautions.  Final diagnoses:  Palpitations       Wendall Papa, MD 11/13/13 1251

## 2014-01-27 ENCOUNTER — Ambulatory Visit (INDEPENDENT_AMBULATORY_CARE_PROVIDER_SITE_OTHER): Payer: No Typology Code available for payment source | Admitting: Podiatry

## 2014-01-27 ENCOUNTER — Encounter: Payer: Self-pay | Admitting: Podiatry

## 2014-01-27 DIAGNOSIS — M79609 Pain in unspecified limb: Secondary | ICD-10-CM

## 2014-01-27 DIAGNOSIS — M779 Enthesopathy, unspecified: Principal | ICD-10-CM

## 2014-01-27 DIAGNOSIS — L608 Other nail disorders: Secondary | ICD-10-CM

## 2014-01-27 DIAGNOSIS — M778 Other enthesopathies, not elsewhere classified: Secondary | ICD-10-CM

## 2014-01-27 DIAGNOSIS — M7750 Other enthesopathy of unspecified foot: Secondary | ICD-10-CM

## 2014-01-27 DIAGNOSIS — M79672 Pain in left foot: Secondary | ICD-10-CM

## 2014-01-27 NOTE — Progress Notes (Signed)
She presents today for followup of pain to the dorsal lateral aspect of the left foot. She states it primarily bothers her as she walks downstairs. Going upstairs really doesn't bother her that much a general walking she does have pain and she points to the dorsal lateral aspect of the left foot. The last time she was in I injected the left foot which did not alleviate any of her symptoms. She's also complaining of thick nails and nail discoloration.  Objective: Pulses are strongly palpable left foot. She has pain on palpation overlying the fourth fifth metatarsocuboid articulation. Her nails are thick yellow dystrophic.  Assessment: Nail dystrophy care rule out onychomycosis. Chronic intractable pain fifth metatarsal fourth metatarsal articulation left foot.  Plan: Samples of the nails were taken today and were sent for review. We are also going to obtain an MRI of her left foot suspicious of a rupture ligament left fourth fifth met articulation of the cuboid.

## 2014-01-28 ENCOUNTER — Telehealth: Payer: Self-pay | Admitting: *Deleted

## 2014-01-28 NOTE — Telephone Encounter (Signed)
Called and spoke with pt letting her know MRI at [facility] is 6.25.15 arrival time 1:45 for a 2:15 mri. Pt understood.

## 2014-01-30 ENCOUNTER — Encounter: Payer: Self-pay | Admitting: *Deleted

## 2014-01-30 NOTE — Progress Notes (Signed)
Josem Kaufmann #[id number] for mri scheduled 6.25.15 good thru 7.25.15.

## 2014-02-06 ENCOUNTER — Ambulatory Visit: Payer: Self-pay | Admitting: Podiatry

## 2014-02-07 ENCOUNTER — Telehealth: Payer: Self-pay | Admitting: *Deleted

## 2014-02-07 NOTE — Telephone Encounter (Signed)
Called to let pt know dr Milinda Pointer wanted to send her mri out for a re-read. Will call when get results. Pt understood.

## 2014-02-11 ENCOUNTER — Other Ambulatory Visit: Payer: Self-pay | Admitting: *Deleted

## 2014-02-11 MED ORDER — NUVAIL EX SOLN: CUTANEOUS | Status: DC

## 2014-02-11 NOTE — Telephone Encounter (Signed)
Spoke with pt letting her know pathology report was negative for fungus. Per dr Milinda Pointer can remove toenail or try nuvail. Per pt she wants to try nuvail. [facility]

## 2014-02-12 ENCOUNTER — Encounter: Payer: Self-pay | Admitting: Podiatry

## 2014-02-14 ENCOUNTER — Telehealth: Payer: Self-pay | Admitting: *Deleted

## 2014-02-14 NOTE — Telephone Encounter (Signed)
Patient called wanting to know her mri results  i explained to patient that the results were not yet back will call her as soon as they come in

## 2014-02-24 ENCOUNTER — Ambulatory Visit: Payer: No Typology Code available for payment source | Admitting: Podiatry

## 2014-02-24 ENCOUNTER — Telehealth: Payer: Self-pay | Admitting: Podiatry

## 2014-02-24 ENCOUNTER — Encounter: Payer: Self-pay | Admitting: Podiatry

## 2014-02-24 NOTE — Telephone Encounter (Signed)
OPENED IN ERROR

## 2014-02-26 ENCOUNTER — Ambulatory Visit (INDEPENDENT_AMBULATORY_CARE_PROVIDER_SITE_OTHER): Payer: No Typology Code available for payment source | Admitting: Podiatry

## 2014-02-26 ENCOUNTER — Encounter: Payer: Self-pay | Admitting: Podiatry

## 2014-02-26 VITALS — BP 116/73 | HR 86 | Resp 12

## 2014-02-26 DIAGNOSIS — M779 Enthesopathy, unspecified: Principal | ICD-10-CM

## 2014-02-26 DIAGNOSIS — M778 Other enthesopathies, not elsewhere classified: Secondary | ICD-10-CM

## 2014-02-26 DIAGNOSIS — M129 Arthropathy, unspecified: Secondary | ICD-10-CM

## 2014-02-26 DIAGNOSIS — M7750 Other enthesopathy of unspecified foot: Secondary | ICD-10-CM

## 2014-02-26 DIAGNOSIS — M1990 Unspecified osteoarthritis, unspecified site: Secondary | ICD-10-CM

## 2014-02-26 MED ORDER — CELECOXIB 200 MG PO CAPS: 200.0000 mg | ORAL_CAPSULE | Freq: Every day | ORAL | Status: DC

## 2014-02-26 NOTE — Progress Notes (Signed)
She presents today for followup of her MRI left foot. She states that the toes still sore.  Objective: Vital signs are stable she is alert and oriented x3 pulses are palpable bilateral. 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  Assessment: Capsulitis/osteoarthritis left foot.  Plan: Started her on Celebrex 200 mg 1 by mouth daily. She was also scanned for a set of orthotics.

## 2014-03-07 ENCOUNTER — Encounter: Payer: Self-pay | Admitting: *Deleted

## 2014-03-07 NOTE — Progress Notes (Signed)
Sent pt post card letting her know orthotics are here and left voicemail on phone.

## 2014-03-12 ENCOUNTER — Encounter: Payer: Self-pay | Admitting: *Deleted

## 2014-03-12 ENCOUNTER — Ambulatory Visit (INDEPENDENT_AMBULATORY_CARE_PROVIDER_SITE_OTHER): Payer: No Typology Code available for payment source | Admitting: *Deleted

## 2014-03-12 VITALS — BP 111/73 | HR 96 | Resp 16

## 2014-03-12 DIAGNOSIS — M779 Enthesopathy, unspecified: Principal | ICD-10-CM

## 2014-03-12 DIAGNOSIS — M7750 Other enthesopathy of unspecified foot: Secondary | ICD-10-CM

## 2014-03-12 DIAGNOSIS — M778 Other enthesopathies, not elsewhere classified: Secondary | ICD-10-CM

## 2014-03-12 NOTE — Patient Instructions (Signed)

## 2014-03-12 NOTE — Progress Notes (Signed)
Pt presents today to pick up orthotics. Went over wearing instructions with pt.

## 2014-04-14 ENCOUNTER — Ambulatory Visit: Payer: No Typology Code available for payment source | Admitting: Podiatry

## 2014-04-30 ENCOUNTER — Ambulatory Visit (INDEPENDENT_AMBULATORY_CARE_PROVIDER_SITE_OTHER): Payer: No Typology Code available for payment source | Admitting: Podiatry

## 2014-04-30 VITALS — BP 113/72 | HR 88 | Resp 16

## 2014-04-30 DIAGNOSIS — B351 Tinea unguium: Secondary | ICD-10-CM

## 2014-04-30 DIAGNOSIS — M129 Arthropathy, unspecified: Secondary | ICD-10-CM

## 2014-04-30 DIAGNOSIS — M1990 Unspecified osteoarthritis, unspecified site: Secondary | ICD-10-CM

## 2014-04-30 MED ORDER — TAVABOROLE 5 % EX SOLN: CUTANEOUS | Status: DC

## 2014-04-30 NOTE — Progress Notes (Signed)
She presents today for followup of her osteoarthritis of the use of her orthotics she states is doing better with the orthotics and the arthritis has improved that she has good days and bad days still. She also states that she would like to see about getting a topical antifungal to apply to her toenails.  Objective: Vital signs are stable she is alert and oriented x3. No change in physical findings from previous exam. Onychodystrophy and onychomycosis is present.  Assessment: Onychomycosis and osteoarthritis.  Plan: Started her on topical antifungal and she will continue the use of orthotics.

## 2014-10-09 ENCOUNTER — Ambulatory Visit: Payer: Self-pay | Admitting: Psychiatry

## 2014-11-19 ENCOUNTER — Ambulatory Visit
Admit: 2014-11-19 | Disposition: A | Discharge status: Home / Self Care | Payer: Self-pay | Attending: Psychiatry | Admitting: Psychiatry

## 2014-12-02 NOTE — Op Note (Signed)
PATIENT NAME:  [name] [name], [name] [name] MR#:  [id number] DATE OF BIRTH:  05/26/1956  DATE OF PROCEDURE:  05/28/2012  PREOPERATIVE DIAGNOSES:  1. Need for long-term intravenous therapy.  2. Nonfunctional and partially removed left arm PICC line.   POSTOPERATIVE DIAGNOSES:  1. Need for long-term intravenous therapy.  2. Nonfunctional and partially removed left arm PICC line.   PROCEDURE: Removal and replacement through same venous access in the left arm peripherally inserted central venous catheter using fluoroscopic guidance.   SURGEON: Leotis Pain, MD   ANESTHESIA: Local.   ESTIMATED BLOOD LOSS: Minimal.   CONTRAST USED: None.  FLUOROSCOPY TIME: One minute.   INDICATION FOR PROCEDURE: This individual had a PICC line placed previously. This has been pulled well out and is no longer in an acceptable location. She comes in for replacement of this.   DESCRIPTION OF PROCEDURE: The patient was brought to the vascular and interventional radiology suite. The left upper extremity was sterilely prepped and draped as was the existing PICC line. An 0.018 wire was used to rewire the existing PICC line and that PICC line was removed. A new PICC line was placed over the wire, parked in the superior vena cava, and the wire was removed. The catheter withdrew blood well and flushed easily with sterile saline.      This is a single lumen PICC line. It was secured to the skin at 43 cm.   ____________________________ Algernon Huxley, MD jsd:bjt D: 05/28/2012 15:18:44 ET T: 05/28/2012 15:35:08 ET JOB#: [id number]  cc: Algernon Huxley, MD, <Dictator> Algernon Huxley MD ELECTRONICALLY SIGNED 06/04/2012 13:09

## 2014-12-02 NOTE — Consult Note (Signed)
Impression: 59yo WF w/ h/o right ankle fracture, s/p plate and screw placement complicated by loosening and subsequent removal of two screws who was admitted with possible hardware infection.  A prior CT showed a possible fluid collection.  She was treated with a round of doxycycline after her last discharge.  She has continued to have inflammation around the site.  She has had all the hardware removed and the fracture has healed. I would favor treating her as though the hardware was infected to minimize the chance of developing osteomyelitis which may compromise the recently healed fracture. Continue vanco.  Will await the intraop cultures.  Prior culture in 01/2012 grew Methacillin Sensitive Staph aureus, but does not appear to be a surgical specimen. Will plan on having a PICC line placed on Monday.  Will decide on antibiotics depending on the culture results. 5) Will check ESR and CRP in am.  Electronic Signatures: Lacrystal Barbe, Heinz Knuckles (MD)  (Signed on 20-Sep-13 15:21)  Authored  Last Updated: 20-Sep-13 15:21 by Teea Ducey, Heinz Knuckles (MD)
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
## 2014-12-02 NOTE — Consult Note (Signed)
PATIENT NAME:  [name] [name], SONS MR#:  [id number] DATE OF BIRTH:  03-24-1956  DATE OF CONSULTATION:  05/04/2012  REFERRING PHYSICIAN:  Timoteo Gaul, MD  CONSULTING PHYSICIAN:  Heinz Knuckles. Press Casale, MD  REASON FOR CONSULTATION: Possible infected orthopedic hardware.   HISTORY OF PRESENT ILLNESS: The patient is a 59 year old white female with a past history significant for a right ankle fracture, status post plate and screw placement which was complicated by loosening and subsequent removal of two screws, and who has had evidence for local skin infection, who was admitted with redness and swelling of the area over the hardware. The patient was admitted today and was brought to the OR where she underwent removal of the hardware. Cultures of the wound and the bone and soft tissues were sent for culture. She had been having several days of pains and swelling with some redness from around her ankle. She did not have significant purulent drainage. In the past, she has had cultures of the ankle that have grown methicillin sensitive Staphylococcus aureus, although these appeared to be more superficial cultures then surgical cultures. At times when she has had the screws removed, cultures have been negative. She was recently hospitalized in July. At that time there was concern on CAT scan of possible osteomyelitis, however, it was not definitive. 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 She was treated with oral therapy and had some improvement, however the redness returned. She denies any fevers, chills, or sweats. She has been ambulating without significant difficulty. She does state that since the surgery she has been having pain in the right ankle. A sedimentation rate from July 5th was 104, and on August 1st was 11, and on September 17th was 36.0.  C-reactive protein was 11.7 on July 5th, 10.8 on August 1st, and 4.5 on September 17th. Her white count was normal on September  17th.   ALLERGIES: None.   PAST MEDICAL HISTORY: Right ankle fracture status post ORIF complicated by loosening of screws with subsequent removal and infection that was treated with a short course of oral therapy.   FAMILY HISTORY: Noncontributory.   SOCIAL HISTORY: The patient lives by herself. 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 She does not drink. No history of injecting drug use.  FAMILY HISTORY: Positive for hypertension, myocardial infarction and a brain tumor.   REVIEW OF SYSTEMS: GENERAL: No fevers, chills, or sweats. HEENT: No headaches. No sinus congestion. No sore throat. NECK: No stiffness. No swollen glands. RESPIRATORY: No cough, no shortness of breath, no sputum production. CARDIAC: No chest pains or palpitations. GI: No nausea, no vomiting, no abdominal pain. GU: No change in her urine. MUSCULOSKELETAL: She has pain in her right ankle related to her recent surgery. Prior to that, she had been having some redness and swelling but no drainage from the ankle. She was also having pain in the ankle. SKIN: No rashes other than the right ankle. NEUROLOGIC: No focal weakness. PSYCHIATRIC: No complaints. All other systems are negative.   PHYSICAL EXAMINATION:  VITAL SIGNS: T-max 97.9, pulse 98, blood pressure 104/64, 96% on room air.   GENERAL: A 59 year old white female in no acute distress.   HEENT: Normocephalic, atraumatic. Pupils are equal, reactive to light. Extraocular motion appears to be intact. Sclerae, conjunctivae, and lids are without evidence for emboli or petechiae. Oropharynx shows lips and gums appear to be normal.   NECK: Midline trachea. No lymphadenopathy. No thyromegaly.  LUNGS: Clear to auscultation bilaterally with good air movement. No focal consolidation.   HEART: Regular rate and rhythm without murmur, rub, or gallop.   ABDOMEN: Soft, nontender, and nondistended. No hepatosplenomegaly. No hernia is noted.   EXTREMITIES: No evidence for  tenosynovitis.   SKIN: No rashes. The right ankle was wrapped in bandages postoperatively and was not directly observed. There was no edema present above the bandage, and there was no evidence for lymphangitic streaking or redness above the bandage.   SKIN: The patient is without stigmata of endocarditis, specifically no Janeway lesions nor Osler nodes.   NEUROLOGIC: The patient is awake and interactive, moving all four extremities.   PSYCHIATRIC: Mood and affect appeared normal.   LABORATORY, DIAGNOSTIC AND RADIOLOGICAL DATA: BUN 15, creatinine 0.72, bicarbonate 25, anion gap is 7.0. White count of 6.4 with a hemoglobin of 12.7, platelet count of 266, and a sedimentation rate of 36. These were all obtained on September 17th preadmission. C-reactive protein at that time was 4.5. Wound cultures from today showed a Gram stain with small gram-positive cocci. Cultures are currently pending. Bone scan from July 6th showed increased uptake in the soft tissues of the right ankle thought to be due to cellulitis. There is also some uptake in the fibular diatheses and within the distal tibia and medial malleolus where the sites of fracture were. Superimposed infection could not be excluded, however, a CT scan of the right ankle with contrast from July 5th showed soft tissue edema on the lateral aspect of the ankle and foot. There was a 1.7 x 0.7 cm fluid collection. No more recent radiographic imaging is available for review.   IMPRESSION: A 59 year old white female with a history of right ankle fracture, status post plate and screw placement complicated by loosening and subsequent removal of two screws, who is admitted with possible hardware infection.   RECOMMENDATIONS: A prior CT showed possible fluid collection. She was treated with a round of doxycycline after her last discharge. She has continued to have inflammation around the site. She had all the hardware removed and the fracture has healed totally.    1. I would favor treating her as though the hardware was infected to minimize the chance of developing osteomyelitis, which may compromise the recently healed fracture.  2. I would continue vancomycin. I will await the intraoperative cultures. Prior cultures in June 2013 grew methicillin-sensitive Staphylococcus aureus, but this does not appear to be a surgical specimen.  3. I will plan on having a PICC line placed on Monday. I will decide on antibiotics depending on the culture results. I will likely treat for six weeks.  4. I will check a sedimentation rate and C-reactive protein in the morning post removal of the hardware.   This is a moderately complex Infectious Disease consultation. Thank you very much for involving me in Ms. Finnell's care.   ____________________________ Heinz Knuckles. Jawanza Zambito, MD meb:cbb D: 05/04/2012 15:30:51 ET T: 05/04/2012 18:53:31 ET JOB#: [id number] Oseph Imburgia E Afton Lavalle MD ELECTRONICALLY SIGNED 05/08/2012 9:24

## 2014-12-02 NOTE — Op Note (Signed)
PATIENT NAME:  [name] [name], [name] [name] MR#:  [id number] DATE OF BIRTH:  06-05-1956  DATE OF PROCEDURE:  05/04/2012  PREOPERATIVE DIAGNOSIS: Right ankle painful hardware.   POSTOPERATIVE DIAGNOSIS: Painful right ankle hardware with abscess.   OPERATION: Removal of hardware with irrigation and debridement of wound abscess.   SURGEON: Timoteo Gaul, MD   ANESTHESIA: General.   SPECIMENS: Swab of abscess and bone and soft tissue samples x2 to Microbiology.   ESTIMATED BLOOD LOSS: 100 mL.   COMPLICATIONS: None.   INDICATIONS FOR PROCEDURE: The patient is a 59 year old female who had undergone an open reduction internal fixation of a right bimalleolar ankle fracture six months previously. She returned recently to the clinic with increasing swelling and redness in the right ankle and pain over the lateral ankle. X-rays in the office demonstrated the fracture was healed and it was felt the patient would benefit from hardware removal.   I reviewed the risks and benefits of surgery with the patient. She understood the risks include infection, bleeding, nerve or blood vessel injury, fracture, persistent pain, and the need for further surgery including ankle arthrodesis. Medical complications include DVT and pulmonary embolism, myocardial infarction, stroke, pneumonia, respiratory failure, and death.   PROCEDURE NOTE: The patient was brought to the operating room where she was placed supine on the operative table. All bony prominences were adequately padded. She underwent general endotracheal intubation. She was prepped and draped in a sterile fashion.   A time-out was performed to verify the patient's name, date of birth, medical record number, correct site of surgery, and correct procedure to be performed. It was also used to verify that all appropriate instruments and radiographic studies were available in the room. Once all in attendance were in agreement, the case began. Because of a suspicion for  possible infection, antibiotics were held at the onset of the case.   A mini FluoroScan was used to identify the underlying plate and screws. A lateral incision was made with a #15 blade. The original incision both medially and laterally were utilized. As the incision was extended proximally and soft tissue dissection performed, an abscess was encountered with purulent drainage. Swab was taken of this material and sent to the lab for stat Gram stain. The soft tissue was then dissected carefully to avoid injury to the superficial peroneal nerve. The underlying plate and screws were then cleared of overlying soft tissue. The screws were removed by using screwdriver by hand. Once all screws and plate were removed, the wound was copiously irrigated. Small bone samples were taken from the proximal and distalmost screw holes. Curette was used to remove any fibrous tissue from the holes in the bone at the screw sites. All soft tissue film from beneath the plate was also curetted. The fracture was well healed and there was no displacement. The patient then had 6 liters of pulse irrigation impregnated with neomycin in the lateral wound. The abscess lining was debrided.   The attention was then turned medially. New instruments were used to avoid cross-contamination. A medial incision was made using the previous incision site. A single cannulated screw and a 3.5 bicortical screw were identified and removed by hand. A small arthrotomy was made into the anterior joint from the medial side and copiously irrigated again with neomycin impregnated saline. Medial side was also pulse irrigated. The medial side was then closed with 2-0 PDS and skin staples. The lateral side was also closed with 2-0 PDS and the skin approximated with skin  staples and 4-0 nylon. A dry sterile dressing was applied along with an AO splint. The culture swab and bone and soft tissue samples from the lateral wound were sent to Microbiology for stat Gram  stain and culture and sensitivity. The patient was then awakened and brought to the PAC-U in stable condition. I was scrubbed and present for the entire case and all sharp and instrument counts were correct at the conclusion of the case.   I spoke with the patient's ex-husband who had brought her to the hospital and let him know that the case had gone without complication. Based on the findings of the OR, the patient was admitted to the hospital for IV antibiotics and Infectious Disease consultation called from the PAC-U. I personally spoke with Dr. Clayborn Bigness who has agreed to see the patient and make antibiotic recommendations.   ____________________________ Timoteo Gaul, MD klk:drc D: 05/07/2012 17:44:54 ET T: 05/07/2012 18:06:23 ET JOB#: [id number] cc: Timoteo Gaul, MD, <Dictator> Timoteo Gaul MD ELECTRONICALLY SIGNED 05/12/2012 13:12

## 2014-12-02 NOTE — Discharge Summary (Signed)
PATIENT NAME:  [name] [name], [name] [name] MR#:  [id number] DATE OF BIRTH:  28-May-1956  DATE OF ADMISSION:  02/17/2012 DATE OF DISCHARGE:  02/20/2012  HISTORY OF PRESENT ILLNESS: [name] [name] is a 59 year old female who had presented earlier in the day to my office with swelling and erythema over the right ankle. She had recently had two screws which loosened in the ankle which were removed through a small incision. This incision had slight serous drainage from it and surrounding erythema. She had tenderness over this area as well. She was admitted for treatment of postoperative cellulitis.   HOSPITAL COURSE: The patient was admitted on 02/17/2012. She was placed on vancomycin and Zosyn for broad coverage for antibiotics. Infectious Disease was consulted. Dr. Clayborn Bigness had seen the patient. She had a CT scan showing a possible small amount of fluid just superficial to the plate. She also had a bone scan showing soft tissue signal as well as increased uptake in the bimalleolar. There was no obvious evidence for any significant bone loss or osteomyelitis. The patient's was placed on strict bedrest and elevation of the right lower extremity. The patient was compliant with this plan, and she clinically improved over the next several days to the point where there was no further drainage. Swelling had dramatically decreased with compressive wraps, and the erythema had resolved. It was recommended by Dr. Clayborn Bigness that the patient be discharged on doxycycline for 10 days. Given the patient's clinical improvement, she was prepared for discharge.   DISCHARGE INSTRUCTIONS: I gave the patient is strict instructions to continue strict bedrest and elevation at home. She will complete her doxycycline course. She may resume all home medications. She will continue compressive wraps with an Ace wrap and TED hose. She will follow-up me in 10 days for re-evaluation in the clinic. She will call me immediately if she has worsening erythema, edema,  or drainage.  ____________________________ Timoteo Gaul, MD klk:cbb D: 03/02/2012 12:24:37 ET T: 03/02/2012 13:01:18 ET JOB#: [id number]  cc: Timoteo Gaul, MD, <Dictator> Timoteo Gaul MD ELECTRONICALLY SIGNED 03/06/2012 10:04

## 2014-12-07 NOTE — H&P (Signed)
Subjective/Chief Complaint Left wrist pain    History of Present Illness Patient is two days out from her initial surgery on her right ankle.  She had an ORIF of the right bimalleolar fracture.  She went home after surgery and then tripped over a vacuum per the patient's report and landed on an outstretched hand.  She presented to the office today with an injury to the left wrist.  She was seen in the office with her ex-husband who expressed some concerns about her ability to care for herself at home.  He has to take care of his mother and is unable to also care for the patient.  He also expressed concerns about non-compliance with non-weightbearing instructions on the right leg.   Past Med/Surgical Hx:  Denies medical history:   ORIF right ankle:   ALLERGIES:  No Known Allergies:   HOME MEDICATIONS: Medication Instructions Status  ibuprofen 800 mg oral tablet 1 tab(s) orally 3 times a day, As Needed Active  guaifenesin 400 mg oral tablet 2 tab(s) orally once a day Active  oxycodone 5 mg oral tablet 1 to 2  tab(s) orally every4 to 6 hours,for Pain Active  amitriptyline 50 mg oral tablet 3 tab(s) orally once a day (at bedtime) as needed for sleep Active  Fosamax 70 mg oral tablet 1 tab(s) orally once a week on saturday Active  venlafaxine 75 mg oral tablet, extended release 3 tab(s) orally once a day Active   Family and Social History:   Family History Non-Contributory    Place of Living Home   Review of Systems:   Subjective/Chief Complaint left wrist   Physical Exam:   GEN NAD    HEENT hearing intact to voice, pupils are narrowed.  Patient with nasal congestion.    EXTR Swelling of left wrist, mild deformity, NVI.  Right leg in AO splint.    SKIN normal to palpation    NEURO motor/sensory function intact    PSYCH alert, A+O to time, place, person, good insight   Radiology Results: XRay:    03-Mar-13 09:09, Ankle Right Complete   Ankle Right Complete   REASON FOR  EXAM:    fall..pain/swwelling....pt in [place]  COMMENTS:       PROCEDURE: DXR - DXR ANKLE RIGHT COMPLETE  - Oct 16 2011  9:09AM     RESULT:     Fractures of the medial and lateral malleoli are noted. These are   slightly displaced. Associated posterior malleolar fracture cannot be   completely excluded.    IMPRESSION:  Slightly displaced medial and lateral malleoli fractures. A   subtle fracture of the posterior malleolus cannot be completely excluded.      Thank you for this opportunity to contribute to the care of your patient.           Verified By: Osa Craver, M.D., MD     Assessment/Admission Diagnosis Left distal radius fracture    Plan Patient had a distal radius fracture of the left wrist diagnosed by x-ray in my office.  This was closed reduced and casted in my office.  Patient will be unable to bear weight on right leg and left wrist now.  She is unable to care for herself at home.  I have serious concerns about her safety at home and feel she needs a SNF for rehab until she is deemed safe to go home while obeying weight bearing restrictions.  Awaiting SNF placement.  Case management assisting placement.  Electronic Signatures: Thornton Park (MD)  (Signed 15-Mar-13 16:19)  Authored: CHIEF COMPLAINT and HISTORY, PAST MEDICAL/SURGIAL HISTORY, ALLERGIES, HOME MEDICATIONS, FAMILY AND SOCIAL HISTORY, REVIEW OF SYSTEMS, PHYSICAL EXAM, Radiology, ASSESSMENT AND PLAN   Last Updated: 15-Mar-13 16:19 by Thornton Park (MD)

## 2014-12-07 NOTE — H&P (Signed)
Subjective/Chief Complaint Right ankle swelling and erythema    History of Present Illness Patient is a 59 year old female who presents today for evaluation of her right ankle.  She had staples removed from her ankle at her workplace, [employer], yesterday.  She has noted the onset of lower extremity swelling and erythema over the past few days.  She is status post ORIF of right ankle for bimalleolar ankle fracture in early March and had two screws removed on 6/19 for loosening.  Radiographs in the office today show fractures appear healed and the hardware is showing no evidence of failure or loosening.   Past Med/Surgical Hx:  Low back Pain:   Scoliosis:   ORIF right ankle:   ALLERGIES:  No Known Allergies:   HOME MEDICATIONS: Medication Instructions Status  acetaminophen-hydrocodone 325 mg-5 mg oral tablet 1 tab(s) orally 3 times a day, As Needed Active  Bactrim DS 800 mg-160 mg oral tablet 1 tab(s) orally 2 times a day Active  cephalexin 500 mg oral capsule 1 cap(s) orally 4 times a day Active  ibuprofen 800 mg oral tablet 1 tab(s) orally 2 times a day, As Needed Active  amitriptyline 50 mg oral tablet 3 tab(s) orally once a day (at bedtime) as needed for sleep Active  venlafaxine 75 mg oral capsule, extended release 1 cap(s) orally 3 times a day Active  Citracal + D 2 tab(s) orally 2 times a day Active  Glucosamine & Chondroitin with MSM 3 tab(s) orally 2 times a day. Stop for surgery. Active  fiber psyllium 52 Gm 5 cap(s) orally 3 times a day Active  citalopram 20 mg oral tablet 2 tab(s) orally once a day (in the morning) Active  multivitamin with minerals 1 tab(s) orally once a day (in the morning) Active  clonazepam 1 mg oral tablet 1 tab(s) orally 3 times a day, As Needed- for Anxiety, Nervousness  Active  Norco 5 mg-325 mg oral tablet 1-2 tab(s) orally every 4 to 6 hours, As Needed- for Pain  Active  Phenergan 12.5 mg 1 tab(s) orally every 4 to 6 hours, As Needed Active   Fosamax 70 mg oral tablet 1 tab(s) orally once a week on Monday Active  aspirin 325 mg oral tablet 1 tab(s) orally once a day (in the morning) Active   Review of Systems:   Subjective/Chief Complaint Right ankle pain, swelling and erythema   Physical Exam:   GEN no acute distress    EXTR Tenderness to palpation over the lateral malleolus.  No deformity.  + edema    SKIN erythema, with lateral drainage from her incision.    NEURO motor/sensory function intact    PSYCH alert, A+O to time, place, person     Assessment/Admission Diagnosis Right ankle cellulitis    Plan Patient is being admitted for infection workup.  Cellulitis is most likely.  Vanco and zosyn are ordered.  ID consult ordered.  Patient ordered for elevation and compression.  Bedrest except for bathroom to avoid increasing lower extremity edema.  CBC,ESR,CRP, BMP ordered.  CT ordered to evaluate area of fullness just anterior and superior to incision for possible abscess.  Bone scan also ordered to evaluate for osteomyelitis of distal fibula.   Electronic Signatures: Thornton Park (MD)  (Signed 05-Jul-13 15:16)  Authored: CHIEF COMPLAINT and HISTORY, PAST MEDICAL/SURGIAL HISTORY, ALLERGIES, HOME MEDICATIONS, REVIEW OF SYSTEMS, PHYSICAL EXAM, ASSESSMENT AND PLAN   Last Updated: 05-Jul-13 15:16 by Thornton Park (MD)

## 2014-12-07 NOTE — Discharge Summary (Signed)
PATIENT NAME:  [name] [name], [name] [name] MR#:  [id number] DATE OF BIRTH:  May 18, 1956  DATE OF ADMISSION:  10/28/2011 DATE OF DISCHARGE:  11/01/2011   SUMMARY: The patient was admitted on 10/28/2011 after presenting to the orthopedic office with a new distal radius fracture. She had fallen at home after undergoing a right bimalleolar ankle fracture ORIF on 10/26/2011. The patient was placed in a short arm cast in the office and admitted for pain control and concerns over the patient's ability to safely care for herself at home. Throughout the patient's hospitalization, she was followed by the orthopedic surgery service. She had a [facility] medical consult for sinus pain and congestion. She was started empirically on antibiotics. The patient worked with physical therapy during her hospitalization. She was using a platform walker so that she could lean on the left elbow to provide upper body support for her right lower leg. The patient made slow progress with physical therapy while an inpatient. She was finally approved for a stay at skilled nursing facility on 11/01/2011. Given her clinical improvement, she was prepared for discharge to rehab.   DISCHARGE INSTRUCTIONS:  1. The patient will remain nonweightbearing on the right lower extremity and the left wrist. She must use a platform walker for assistance with ambulation. She will continue to receive physical and occupational therapy while at the skilled nursing facility.  2. She will follow-up in approximately 10 days in the orthopedic office for re-evaluation and x-ray of her right ankle and left wrist.  3. The patient has been given narcotic prescriptions in my office. She should have tablets remaining and does not require any additional prescriptions.  4. She should take an enteric-coated aspirin 325 mg daily for DVT prophylaxis given her right ankle injury.   DISCHARGE MEDICATIONS:  1. Amitriptyline 100 mg p.o. at bedtime. 2. Colace 100 mg b.i.d. while on  narcotics.  3. Fluticasone propionate nasal spray two sprays each nostril daily.  4. Guaifenesin 600 mg p.o. q.12 hours for cough. 5. Ibuprofen 800 mg p.o. b.i.d.  6. Oxycodone 5 to 10 mg every 4 to 6 hours p.r.n. for pain.   7. Venlafaxine 75 mg p.o. daily at 9, 12, and 18:00 for depression and anxiety.  8. Fosamax 70 mg q. week.  ____________________________ Timoteo Gaul, MD klk:drc D: 11/01/2011 12:39:14 ET T: 11/01/2011 13:16:25 ET JOB#: [id number]  cc: Timoteo Gaul, MD, <Dictator> Timoteo Gaul MD ELECTRONICALLY SIGNED 11/04/2011 9:19

## 2014-12-07 NOTE — Consult Note (Signed)
PATIENT NAME:  [name] [name], [name] [name] MR#:  [id number] DATE OF BIRTH:  1956-05-07  DATE OF CONSULTATION:  10/28/2011  REFERRING PHYSICIAN: ER physician, Dr. Mack Guise.  CONSULTING PHYSICIAN:  Cherre Huger, MD  PRIMARY CARE PHYSICIAN: Dr. Netty Starring.  REASON FOR CONSULTATION: Possible sinusitis.  HISTORY OF PRESENT ILLNESS: The patient is a 59 year old female with past medical history of depression who was recently treated for a right ankle bimalleolar fracture and was discharged home. At home patient fell on her outstretched hand and has a left distal radius fracture. The patient has been readmitted to the orthopedic service. Medicine was consulted for possible sinusitis symptoms.  The patient reports that her nose feels very congested and clogged up, however, when she tries to blow her nose she is not able to bring any mucus. She does feel that her sinuses are popping.  She has a headache, dry mouth, and occasional postnasal drip. She also feels some itching in her throat and her ears. She denies any fever or difficulty swallowing.   ALLERGIES: No known drug allergies.   PAST MEDICAL HISTORY: Depression.  PAST SURGICAL HISTORY: History of right hand surgery, recent right ankle bimalleolar fracture surgery, left distal radius fracture is in a cast.   SOCIAL HISTORY: Lives alone. Has one child who lives in [place]. Denies any history of smoking, alcohol or drug abuse.   FAMILY HISTORY: Mother had hypertension, died of MI. Father died at the age of 24 of a brain tumor.   CURRENT MEDICATIONS: Tylenol p.r.n. amitriptyline 100 mg at bedtime, Colace 100 mg b.i.d., Mucinex 600 mg b.i.d., ibuprofen 800 mg b.i.d., Zofran p.r.n., oxycodone 5 to 10 mg q.4-6 hours p.r.n., venlafaxine 225 mg daily, and Fosamax 30 mg q. weekly.  REVIEW OF SYSTEMS: CONSTITUTIONAL: Patient denies any fevers or fatigue.   EYES: Denies any blurred or double vision.   ENT: Denies any tinnitus, ear pain. Reports some  postnasal drip and sinus pain.   RESPIRATORY: Denies any cough, wheezing.   CARDIOVASCULAR: Denies any chest pain, palpitations.   GI: Denies any nausea, vomiting, diarrhea, or abdominal pain.   GU: Denies any dysuria or hematuria.  ENDOCRINE:  Denies any polyuria or nocturia.  HEME/LYMPH: Denies any anemia, easy bruisability.   INTEGUMENT: Denies any acne, rash.   MUSCULOSKELETAL: Denies any swelling or gout.   NEUROLOGICAL: Denies any numbness or weakness.   PSYCH: Has history of depression.   PHYSICAL EXAMINATION: VITAL SIGNS: Temperature 98, heart rate 75, respiratory rate 18, blood pressure 148/86, pulse 95% on room air.   GENERAL: The patient is a well-built, well-nourished Caucasian female lying comfortably in bed, not in acute distress.   HEAD: Atraumatic, normocephalic.   EYES: Mild pallor. No icterus. No cyanosis. Pupils equal, round, and reactive to light and accommodation. Extraocular movements intact.   ENT: Dry mucous membranes. No oropharyngeal erythema or thrush. The patient has some irritation in her nasal mucosa. There is tenderness to palpation on her forehead and maxillary sinuses.   NECK: Supple. No masses. No JVD. No thyromegaly. No lymphadenopathy.  CHEST WALL: No tenderness to palpation. Not using accessory muscles of respiration.  No intercostal retractions.  LUNGS: Bilaterally clear to auscultation. No wheezing, rales, or rhonchi.   CARDIOVASCULAR: S1 and S2 regular. No murmur, rubs, or gallops.   ABDOMEN: Soft, nontender, nondistended. No guarding. No rigidity. No organomegaly. Normal bowel sounds.   SKIN: No rashes or lesions.  PERIPHERIES: No pedal edema, 2+ pedal pulses.   MUSCULOSKELETAL: The patient's right lower extremity and  left upper extremity are in dressings. No cyanosis or clubbing.   NEUROLOGIC: Awake, alert and oriented x3.  Nonfocal neurologic evaluation. Cranial nerves are grossly intact.   PSYCH: Normal mood and  affect.  LABORATORY, DIAGNOSTIC AND RADIOLOGICAL DATA: White count 7, hemoglobin 11.7, hematocrit 35, platelets 222,000. Complete metabolic panel normal. Urinalysis showed no evidence of ischemia. ESR 21.  A 59 year old female with past medical history of depression admitted to orthopedic service for left distal radius fracture. Medicine is consulted for possible acute sinusitis.  1. Possible acute sinusitis. Will start patient on empiric antibiotics, place on Sudafed x4 doses and Flonase. 2. Depression. Continue amitriptyline and venlafaxine. 3. Mild anemia. Monitor. 4. Right ankle bimalleolar fracture and left distal radius fracture. Management as per orthopedic service.  Reviewed all medical records. Discussed with the patient the plan of care and management.   TIME SPENT: 50 minutes     ____________________________ Cherre Huger, MD sp:kma D: 10/28/2011 17:30:12 ET T: 10/29/2011 10:37:59 ET JOB#: [id number]  cc: Cherre Huger, MD, <Dictator> Cherre Huger MD ELECTRONICALLY SIGNED 10/31/2011 7:36

## 2014-12-07 NOTE — Consult Note (Signed)
Impression: 59yo WFM w/ h/o right ankle fracture, s/p plate and screw placement complicated by loosening and subsequent removal of two screws who was admitted with possible postoperative infection.[name] [name]  Her CT shows a possible fluid collection around the achilles tendon.   It may be beneficial to drain to obtain cultures.  It appears to be distant to the remaining hardware and would not correspond to her swelling. Agree with broad spectrum antibiotics until culture can be obtained.  It may not be possible to collect a culture in which case we will need to decide about a course of antibiotics empirically. There is some concern about her compliance at home.  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    Electronic Signatures: Alivia Cimino, Heinz Knuckles (MD) (Signed on 05-Jul-13 16:56)  Authored   Last Updated: 05-Jul-13 17:05 by Cordarious Zeek, Heinz Knuckles (MD)

## 2014-12-07 NOTE — Consult Note (Signed)
PATIENT NAME:  [name] [name], [name] [name] MR#:  [id number] DATE OF BIRTH:  [birth year]  DATE OF CONSULTATION:  02/17/2012  REFERRING PHYSICIAN:  Dr. Mack Guise  CONSULTING PHYSICIAN:  Heinz Knuckles. Cresencio Reesor, MD  REASON FOR CONSULTATION: Possible postoperative infection.   HISTORY OF PRESENT ILLNESS: Patient is a 59 year old white female with a past history significant for right ankle fracture who had ORIF with placement of plates and screws on 10/26/2011 whose course was complicated by loosening of screws requiring removal on 06/19 who presents with swelling, redness, and dehiscence of the wound and was admitted today. Patient states that she has been having redness and swelling above the prior wound. She has tenderness both in that area and in the heel. She denies any fevers, chills, or sweats. She has had no nausea or vomiting. She has not been on any antibiotics recently. She had taken out her own staples today rather than go to her orthopedic visit. She was admitted today and has been started on Zosyn. She has had a CT scan performed. Her white count has been normal and a sedimentation rate is elevated.   ALLERGIES: None.   PAST MEDICAL HISTORY: Status post right ankle fracture status post ORIF complicated by loosening of screws and subsequent removal.   FAMILY HISTORY: Noncontributory.   SOCIAL HISTORY: Patient lives by herself. She works in health care.   REVIEW OF SYSTEMS: No fevers, chills, or sweats. No nausea, vomiting. No abdominal pain, no change in her bowels. No change in her urine. She has had pain in the right ankle but no other joints have been bothering her. She had some redness of the skin and failure of the wound to heal but no other rashes.   PHYSICAL EXAMINATION:  VITALS: Temperature 98.1, pulse 92, blood pressure 120/79, 92% on room air.   GENERAL: 59 year old white female in no acute distress.   HEENT: Normocephalic, atraumatic.   CHEST: Clear to auscultation bilaterally. Good air  movement. No focal consolidation.   CARDIAC: Regular rate and rhythm without murmur, rub, or gallop.   ABDOMEN: Soft, nontender, nondistended. No hepatosplenomegaly. No hernia is noted.   EXTREMITIES: Left lower extremity was within normal limits. The right lower extremity had a wound on the lateral aspect. The edges of the wound appeared mature and had some minimal serous drainage. There was some mild erythema surrounding the area but no significant warmth to the area. She had tenderness and swelling mostly proximal to the wound. There was no swelling the heel or Achilles area.   SKIN: No rashes other than those noted above.   NEUROLOGIC: The patient was awake and interactive, moving all four extremities.   PSYCHIATRIC: Mood and affect appeared normal.   LABORATORY, DIAGNOSTIC, AND RADIOLOGICAL DATA: BUN 13, creatinine 0.77, white count 6.6, hemoglobin 10.7, platelet count 302, ANC 3.1, sedimentation rate 85. A CT scan has been performed which demonstrates no acute fracture-dislocation. There was soft tissue edema along the lateral aspect of the ankle and foot. There was a 1.7 x 0.7 fluid collection adjacent to the superior most aspect of the distal fibular side plate concerning for seroma or abscess. There was no subcutaneous emphysema. 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   IMPRESSION AND RECOMMENDATIONS:  64. 59 year old white female with a history of right ankle fracture status post plate and screw placement complicated by loosening and subsequent removal of two screws who was admitted with possible postoperative infection. Her CT shows a possible fluid collection. It appears it may be beneficial  to drain this to obtain cultures.  2. I agree with broad-spectrum antibiotics until the cultures can be obtained. It may not be possible to collect a culture in which case we will need to decide about a course of antibiotics empirically.  3. There is some concern about her compliance at  home. 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   This is a low level infectious disease consult. Thank you very much for involving me in Ms. Gauthreaux's care.   ____________________________ Heinz Knuckles. Ellwyn Ergle, MD meb:cms D: 02/17/2012 17:05:27 ET T: 02/17/2012 17:32:06 ET JOB#: [id number]  cc: Heinz Knuckles. Marti Acebo, MD, <Dictator> Sophiea Ueda E Jahiem Franzoni MD ELECTRONICALLY SIGNED 02/21/2012 12:09

## 2016-08-19 ENCOUNTER — Ambulatory Visit (INDEPENDENT_AMBULATORY_CARE_PROVIDER_SITE_OTHER): Payer: PRIVATE HEALTH INSURANCE | Admitting: Neurology

## 2016-08-19 ENCOUNTER — Encounter: Payer: Self-pay | Admitting: Neurology

## 2016-08-19 VITALS — BP 107/70 | HR 80 | Ht 66.0 in | Wt 167.4 lb

## 2016-08-19 DIAGNOSIS — B1920 Unspecified viral hepatitis C without hepatic coma: Secondary | ICD-10-CM | POA: Insufficient documentation

## 2016-08-19 DIAGNOSIS — G4700 Insomnia, unspecified: Secondary | ICD-10-CM | POA: Insufficient documentation

## 2016-08-19 DIAGNOSIS — R251 Tremor, unspecified: Secondary | ICD-10-CM | POA: Diagnosis not present

## 2016-08-19 DIAGNOSIS — R200 Anesthesia of skin: Secondary | ICD-10-CM

## 2016-08-19 DIAGNOSIS — E039 Hypothyroidism, unspecified: Secondary | ICD-10-CM | POA: Insufficient documentation

## 2016-08-19 DIAGNOSIS — F329 Major depressive disorder, single episode, unspecified: Secondary | ICD-10-CM | POA: Insufficient documentation

## 2016-08-19 DIAGNOSIS — F32A Depression, unspecified: Secondary | ICD-10-CM | POA: Insufficient documentation

## 2016-08-19 NOTE — Patient Instructions (Signed)
Remember to drink plenty of fluid, eat healthy meals and do not skip any meals. Try to eat protein with a every meal and eat a healthy snack such as fruit or nuts in between meals. Try to keep a regular sleep-wake schedule and try to exercise daily, particularly in the form of walking, 20-30 minutes a day, if you can.   As far as diagnostic testing: emg/ncs and lab today  I would like to see you back for emg/ncs, sooner if we need to. Please call us with any interim questions, concerns, problems, updates or refill requests.   Our phone number is [phone]. We also have an after hours call service for urgent matters and there is a physician on-call for urgent questions. For any emergencies you know to call 911 or go to the nearest emergency room

## 2016-08-19 NOTE — Progress Notes (Signed)
[facility]    Provider:  Dr Jaynee Eagles Referring Provider: Marguerita Merles, MD Primary Care Physician:  Marguerita Merles, MD  CC:  Left arm/hand numbness  HPI:  [name] [name] is a 61 y.o. female here as a referral from Dr. Lennox Grumbles for left arm and hand tingling and tremor. Past medical history of hepatitis C, osteoporosis, insomnia, depression, hypothyroid. Symptoms started 6 months ago, without inciting events or trauma, her had would go numb when she was carrying things initially then it progressed to more often without carrying things. The numbness  "comes and goes", she can't identify any triggers now to the numbness but can last 10-15 minutes and numbness in in the whole hand up to the wrist and sometimes radiates into the forearm. No neck pain or radicular symptoms. No weakness. Not worse nocturnally. No trauma. She is right handed. Better if she rests it. She also has a tremor, more with action, started 2 years ago and getting worse, worse with writing or trying to put on makeup. She drinks coffee, 2-3 cups in the morning and diet mountain dew which have caffeine in them. No other focal neurologic deficits, associated symptoms or modifiable factors.   Reviewed notes, labs and imaging from outside physicians, which showed:  Review primary care notes. Patient has numbness and tingling in the left arm and tremor. Hand numbness. She has a history of hepatitis C and osteoporosis. She complained of body aches, left hand numbness for 6 months sometimes radiating into the left arm. Works as a Quarry manager. Also complaining of bilateral hand tremor. Symptoms started 6 months ago. She does take levothyroxin for hypothyroidism. She has depression and she stable on Lexapro and Wellbutrin complaining of worsening memory. MMSE  exam was normal. Appear she is also on phentermine, venlafaxine, citalopram, Wellbutrin, and amitriptyline. Sleep study not recorded. She is a former smoker 1 pack per day.  BUN  10, creatinine 0.9 11/12/2013.  Review of Systems: Patient complains of symptoms per HPI as well as the following symptoms: Weight gain, cough, feeling hot, ringing in ears, joint pain, joint swelling, allergies, numbness, insomnia, depression, decreased energy, disinterest in activities. Pertinent negatives per HPI. All others negative.   Social History   Social History  . Marital status: Divorced    Spouse name: N/A  . Number of children: N/A  . Years of education: N/A   Occupational History  . cna    Social History Main Topics  . Smoking status: Former Research scientist (life sciences)  . Smokeless tobacco: Never Used  . Alcohol use No  . Drug use: No  . Sexual activity: Not on file   Other Topics Concern  . Not on file   Social History Narrative   Lives   Caffeine use:     Family History  Problem Relation Age of Onset  . Neuropathy Neg Hx   . Tremor Neg Hx     Past Medical History:  Diagnosis Date  . Acute hepatitis C without mention of hepatic coma(070.51)   . Depression   . GERD (gastroesophageal reflux disease)   . Insomnia     Past Surgical History:  Procedure Laterality Date  . ANKLE SURGERY Right   . BLADDER TACT    . HAND SURGERY Right   . TONSILLECTOMY      Current Outpatient Prescriptions  Medication Sig Dispense Refill  . amitriptyline (ELAVIL) 50 MG tablet Take 50 mg by mouth. 3 by mouth nightly at bedtime    . buPROPion (BUDEPRION XL)  300 MG 24 hr tablet Take by mouth.    Marland Kitchen buPROPion (WELLBUTRIN SR) 150 MG 12 hr tablet Take 150 mg by mouth 2 (two) times daily.     . calcium carbonate (OS-CAL) 600 MG TABS tablet Take 600 mg by mouth 2 (two) times daily with a meal.    . celecoxib (CELEBREX) 200 MG capsule Take 1 capsule (200 mg total) by mouth daily. 30 capsule 11  . Dermatological Products, Misc. (NUVAIL) SOLN Apply to affected toenail(s) once daily 15 mL 11  . docusate sodium (COLACE) 100 MG capsule Take 100 mg by mouth 2 (two) times daily. 1 by mouth TID    .  levothyroxine (SYNTHROID, LEVOTHROID) 50 MCG tablet Take 50 mcg by mouth daily before breakfast.    . Multiple Vitamins-Minerals (MULTIVITAL-M) TABS Take by mouth once.    Marland Kitchen omeprazole (PRILOSEC) 20 MG capsule Take 20 mg by mouth daily.    Marland Kitchen tretinoin (RETIN-A) 0.05 % cream Apply 0.05 % topically at bedtime.    Marland Kitchen venlafaxine (EFFEXOR) 75 MG tablet Take 75 mg by mouth 3 (three) times daily with meals.    Corrie Dandy (KERYDIN) 5 % SOLN Apply to affected toenail(s) once at bedtime (Patient not taking: Reported on 08/19/2016) 1 Bottle 11   No current facility-administered medications for this visit.     Allergies as of 08/19/2016 - Review Complete 08/19/2016  Allergen Reaction Noted  . Sonata [zaleplon]  07/25/2013    Vitals: BP 107/70 (BP Location: Right Arm, Patient Position: Sitting, Cuff Size: Normal)   Pulse 80   Ht 5\' 6"  (1.676 m)   Wt 167 lb 6.4 oz (75.9 kg)   BMI 27.02 kg/m  Last Weight:  Wt Readings from Last 1 Encounters:  08/19/16 167 lb 6.4 oz (75.9 kg)   Last Height:   Ht Readings from Last 1 Encounters:  08/19/16 5\' 6"  (1.676 m)   Physical exam: Exam: Gen: NAD, conversant, well nourised                     CV: RRR, no MRG. No Carotid Bruits. No peripheral edema, warm, nontender Eyes: Conjunctivae clear without exudates or hemorrhage  Neuro: Detailed Neurologic Exam  Speech:    Speech is normal; fluent and spontaneous with normal comprehension.  Cognition:    The patient is oriented to person, place, and time;     recent and remote memory intact;     language fluent;     normal attention, concentration,     fund of knowledge Cranial Nerves:    The pupils are equal, round, and reactive to light. The fundi are normal and spontaneous venous pulsations are present. Visual fields are full to finger confrontation. Extraocular movements are intact. Trigeminal sensation is intact and the muscles of mastication are normal. The face is symmetric. The palate elevates in  the midline. Hearing intact. Voice is normal. Shoulder shrug is normal. The tongue has normal motion without fasciculations.   Coordination:    Normal finger to nose and heel to shin. Normal rapid alternating movements.   Gait:    Heel-toe and tandem gait are normal.   Motor Observation:    No asymmetry, no atrophy. Postural tremor, high frequency low amplitude Tone:    Normal muscle tone.    Posture:    Posture is normal. normal erect    Strength:    Strength is V/V in the upper and lower limbs.      Sensation: intact to LT  Reflex Exam:  DTR's:    Deep tendon reflexes in the upper and lower extremities are normal bilaterally.   Toes:    The toes are downgoing bilaterally.   Clonus:    Clonus is absent.  +Tinels at the left wrist    Assessment/Plan:  61 year old with episodic left hane numbness, +Tinel's sign at the left wrist. Will order a bilateral upper extremity emg/ncs to evaluate for CTS or other mononeuropathy or polyneuropathy vs radiculopathy. For her postural tremor I recommended stopping all caffeine products(she drinks multiple cups coffee and mountain dew during the day) and will check a tsh, may also be due to medication effects.  Cc:  Marguerita Merles, MD  Sarina Ill, MD  [facility] [address] [place], [place] [address]  Phone [phone] Fax [phone]

## 2016-08-20 LAB — TSH: TSH: 5.07 u[IU]/mL — ABNORMAL HIGH (ref 0.450–4.500)

## 2016-08-22 ENCOUNTER — Telehealth: Payer: Self-pay | Admitting: *Deleted

## 2016-08-22 NOTE — Telephone Encounter (Signed)
-----   Message from Melvenia Beam, MD sent at 08/20/2016  2:53 PM EST ----- TSH unremarkable thanks

## 2016-08-22 NOTE — Telephone Encounter (Signed)
LVM for pt to call about results. Gave [facility] phone number.   *OK to inform pt TSH (thyroid) was unremarkable per Dr Jaynee Eagles.

## 2016-08-23 NOTE — Telephone Encounter (Signed)
Patient called back and has been notified that TSH (thyroid) levels are unremarkable per Dr. Jaynee Eagles

## 2016-08-23 NOTE — Telephone Encounter (Signed)
Noted, thank you

## 2016-09-24 ENCOUNTER — Emergency Department (HOSPITAL_COMMUNITY)
Admission: EM | Admit: 2016-09-24 | Discharge: 2016-09-25 | Disposition: A | Discharge status: Home / Self Care | Payer: PRIVATE HEALTH INSURANCE | Attending: Emergency Medicine | Admitting: Emergency Medicine

## 2016-09-24 ENCOUNTER — Encounter (HOSPITAL_COMMUNITY): Payer: Self-pay

## 2016-09-24 DIAGNOSIS — Z79899 Other long term (current) drug therapy: Secondary | ICD-10-CM | POA: Insufficient documentation

## 2016-09-24 DIAGNOSIS — A084 Viral intestinal infection, unspecified: Secondary | ICD-10-CM | POA: Diagnosis not present

## 2016-09-24 DIAGNOSIS — Z87891 Personal history of nicotine dependence: Secondary | ICD-10-CM | POA: Diagnosis not present

## 2016-09-24 DIAGNOSIS — R112 Nausea with vomiting, unspecified: Secondary | ICD-10-CM | POA: Diagnosis present

## 2016-09-24 DIAGNOSIS — Z7982 Long term (current) use of aspirin: Secondary | ICD-10-CM | POA: Insufficient documentation

## 2016-09-24 LAB — COMPREHENSIVE METABOLIC PANEL
ALBUMIN: 4 g/dL (ref 3.5–5.0)
ALT: 23 U/L (ref 14–54)
AST: 16 U/L (ref 15–41)
Alkaline Phosphatase: 69 U/L (ref 38–126)
Anion gap: 11 (ref 5–15)
BILIRUBIN TOTAL: 0.5 mg/dL (ref 0.3–1.2)
BUN: 12 mg/dL (ref 6–20)
CHLORIDE: 107 mmol/L (ref 101–111)
CO2: 22 mmol/L (ref 22–32)
CREATININE: 0.82 mg/dL (ref 0.44–1.00)
Calcium: 9.6 mg/dL (ref 8.9–10.3)
GFR calc Af Amer: >60 mL/min (ref >60)
GFR calc non Af Amer: >60 mL/min (ref >60)
GLUCOSE: 114 mg/dL — AB (ref 65–99)
POTASSIUM: 3.7 mmol/L (ref 3.5–5.1)
Sodium: 140 mmol/L (ref 135–145)
Total Protein: 6.9 g/dL (ref 6.5–8.1)

## 2016-09-24 LAB — URINALYSIS, ROUTINE W REFLEX MICROSCOPIC
GLUCOSE, UA: NEGATIVE mg/dL
Hgb urine dipstick: NEGATIVE
KETONES UR: NEGATIVE mg/dL
Nitrite: NEGATIVE
PROTEIN: 30 mg/dL — AB
Specific Gravity, Urine: 1.033 — ABNORMAL HIGH (ref 1.005–1.030)
pH: 5 (ref 5.0–8.0)

## 2016-09-24 LAB — CBC
HEMATOCRIT: 42.4 % (ref 36.0–46.0)
Hemoglobin: 14.3 g/dL (ref 12.0–15.0)
MCH: 30.8 pg (ref 26.0–34.0)
MCHC: 33.7 g/dL (ref 30.0–36.0)
MCV: 91.4 fL (ref 78.0–100.0)
PLATELETS: 249 10*3/uL (ref 150–400)
RBC: 4.64 MIL/uL (ref 3.87–5.11)
RDW: 12.5 % (ref 11.5–15.5)
WBC: 8.9 10*3/uL (ref 4.0–10.5)

## 2016-09-24 LAB — LIPASE, BLOOD: LIPASE: 18 U/L (ref 11–51)

## 2016-09-24 MED ORDER — SODIUM CHLORIDE 0.9 % IV BOLUS (SEPSIS)
1000.0000 mL | Freq: Once | INTRAVENOUS | Status: AC
  Administered 2016-09-24: 1000 mL via INTRAVENOUS

## 2016-09-24 MED ORDER — ONDANSETRON 4 MG PO TBDP
ORAL_TABLET | ORAL | Status: AC
  Filled 2016-09-24: qty 1

## 2016-09-24 MED ORDER — BISMUTH SUBSALICYLATE 262 MG/15ML PO SUSP: 30.0000 mL | Freq: Four times a day (QID) | ORAL | 0 refills | Status: DC | PRN

## 2016-09-24 MED ORDER — ONDANSETRON 4 MG PO TBDP
4.0000 mg | ORAL_TABLET | Freq: Once | ORAL | Status: AC | PRN
  Administered 2016-09-24: 4 mg via ORAL

## 2016-09-24 NOTE — ED Provider Notes (Signed)
[facility] DEPT Provider Note   CSN: [id number] Arrival date & time: 09/24/16  1551     History   Chief Complaint Chief Complaint  Patient presents with  . Abdominal Pain  . Nausea  . Emesis    HPI [name] [name] is a 61 y.o. female.  The history is provided by the patient.  Diarrhea   This is a new problem. Episode onset: 4 days ago. The problem occurs 5 to 10 times per day. The problem has not changed since onset.The stool consistency is described as watery. There has been no fever. Associated symptoms include abdominal pain (cramping) and vomiting. She has tried nothing for the symptoms.    Past Medical History:  Diagnosis Date  . Acute hepatitis C without mention of hepatic coma(070.51)   . Depression   . GERD (gastroesophageal reflux disease)   . Insomnia     Patient Active Problem List   Diagnosis Date Noted  . Hepatitis C 08/19/2016  . Depression 08/19/2016  . Insomnia 08/19/2016  . Hypothyroidism 08/19/2016    Past Surgical History:  Procedure Laterality Date  . ANKLE SURGERY Right   . BLADDER TACT    . HAND SURGERY Right   . TONSILLECTOMY      OB History    No data available       Home Medications    Prior to Admission medications   Medication Sig Start Date End Date Taking? Authorizing Provider  amitriptyline (ELAVIL) 50 MG tablet Take 50 mg by mouth at bedtime as needed for sleep.    Yes Historical Provider, MD  aspirin 325 MG EC tablet Take 325 mg by mouth daily.   Yes Historical Provider, MD  buPROPion (WELLBUTRIN SR) 150 MG 12 hr tablet Take 150 mg by mouth 3 (three) times daily.    Yes Historical Provider, MD  calcium carbonate (OS-CAL) 600 MG TABS tablet Take 600 mg by mouth 2 (two) times daily with a meal.   Yes Historical Provider, MD  Cholecalciferol (HM VITAMIN D3) 4000 units CAPS Take 4,000 Units by mouth daily.   Yes Historical Provider, MD  citalopram (CELEXA) 20 MG tablet Take 20 mg by mouth daily. 09/08/16  Yes Historical  Provider, MD  docusate sodium (COLACE) 100 MG capsule Take 100 mg by mouth 3 (three) times daily. 1 by mouth TID   Yes Historical Provider, MD  levothyroxine (SYNTHROID, LEVOTHROID) 50 MCG tablet Take 50 mcg by mouth daily before breakfast.   Yes Historical Provider, MD  lurasidone (LATUDA) 40 MG TABS tablet Take 40 mg by mouth daily at 12 noon.   Yes Historical Provider, MD  meloxicam (MOBIC) 15 MG tablet Take 15 mg by mouth daily. 08/04/16  Yes Historical Provider, MD  Multiple Vitamins-Minerals (MULTIVITAL-M) TABS Take 1 tablet by mouth daily.    Yes Historical Provider, MD  omeprazole (PRILOSEC) 20 MG capsule Take 20 mg by mouth daily.   Yes Historical Provider, MD  tretinoin (RETIN-A) 0.05 % cream Apply 0.05 % topically at bedtime.   Yes Historical Provider, MD  venlafaxine (EFFEXOR) 75 MG tablet Take 75 mg by mouth 3 (three) times daily with meals.   Yes Historical Provider, MD    Family History Family History  Problem Relation Age of Onset  . Neuropathy Neg Hx   . Tremor Neg Hx     Social History Social History  Substance Use Topics  . Smoking status: Former Research scientist (life sciences)  . Smokeless tobacco: Never Used  . Alcohol use No  Allergies   Sonata [zaleplon]   Review of Systems Review of Systems  Gastrointestinal: Positive for abdominal pain (cramping), diarrhea and vomiting.  All other systems reviewed and are negative.    Physical Exam Updated Vital Signs BP 120/83   Pulse 99   Temp 98.5 F (36.9 C) (Oral)   Resp 16   Ht 5\' 5"  (1.651 m)   Wt 159 lb (72.1 kg)   SpO2 96%   BMI 26.46 kg/m   Physical Exam  Constitutional: She is oriented to person, place, and time. She appears well-developed and well-nourished. No distress.  HENT:  Head: Normocephalic.  Nose: Nose normal.  Mouth/Throat: Oropharynx is clear and moist.  Eyes: Conjunctivae are normal.  Neck: Neck supple. No tracheal deviation present.  Cardiovascular: Normal rate, regular rhythm and normal heart  sounds.   Pulmonary/Chest: Effort normal and breath sounds normal. No respiratory distress.  Abdominal: Soft. She exhibits no distension. There is no tenderness. There is no rebound and no guarding.  Neurological: She is alert and oriented to person, place, and time.  Skin: Skin is warm and dry.  Psychiatric: She has a normal mood and affect.     ED Treatments / Results  Labs (all labs ordered are listed, but only abnormal results are displayed) Labs Reviewed  COMPREHENSIVE METABOLIC PANEL - Abnormal; Notable for the following:       Result Value   Glucose, Bld 114 (*)    All other components within normal limits  URINALYSIS, ROUTINE W REFLEX MICROSCOPIC - Abnormal; Notable for the following:    Color, Urine AMBER (*)    APPearance CLOUDY (*)    Specific Gravity, Urine 1.033 (*)    Bilirubin Urine SMALL (*)    Protein, ur 30 (*)    Leukocytes, UA SMALL (*)    Bacteria, UA RARE (*)    Squamous Epithelial / LPF 6-30 (*)    Non Squamous Epithelial 0-5 (*)    All other components within normal limits  URINE CULTURE  LIPASE, BLOOD  CBC    EKG  EKG Interpretation None       Radiology No results found.  Procedures Procedures (including critical care time)  Medications Ordered in ED Medications  ondansetron (ZOFRAN-ODT) 4 MG disintegrating tablet (not administered)  ondansetron (ZOFRAN-ODT) disintegrating tablet 4 mg (4 mg Oral Given 09/24/16 1629)  sodium chloride 0.9 % bolus 1,000 mL (1,000 mLs Intravenous New Bag/Given 09/24/16 2307)     Initial Impression / Assessment and Plan / ED Course  I have reviewed the triage vital signs and the nursing notes.  Pertinent labs & imaging results that were available during my care of the patient were reviewed by me and considered in my medical decision making (see chart for details).     61 y.o. female presents with n/v/d for 4 days. No significant lab abnormalities but feels dehydrated. No respiratory symptoms. No urinary  symptoms. Leukocytes in urine but no signs of UTI clinically. Given IVF and zofran for supportive care. Plan to follow up with PCP as needed and return precautions discussed for worsening or new concerning symptoms.   Final Clinical Impressions(s) / ED Diagnoses   Final diagnoses:  Viral gastroenteritis    New Prescriptions New Prescriptions   BISMUTH SUBSALICYLATE (PEPTO-BISMOL) 262 MG/15ML SUSPENSION    Take 30 mLs by mouth every 6 (six) hours as needed.     Leo Grosser, MD 09/25/16 [phone]

## 2016-09-24 NOTE — ED Triage Notes (Signed)
Pt. Is having an/v/d for 4 days.  She denies any cold symptoms or fevers.  She is having chills .   Intermittent abdominal cramping noted. Pt. Feels weak and fatigued.  No blood noted  Skin is warm and dry.  Pt. Is alert and oriented X4

## 2016-09-26 LAB — URINE CULTURE: Culture: <10000 — AB

## 2016-10-06 ENCOUNTER — Encounter: Payer: PRIVATE HEALTH INSURANCE | Admitting: Neurology

## 2016-11-03 ENCOUNTER — Ambulatory Visit (INDEPENDENT_AMBULATORY_CARE_PROVIDER_SITE_OTHER): Payer: Self-pay | Admitting: Neurology

## 2016-11-03 ENCOUNTER — Ambulatory Visit (INDEPENDENT_AMBULATORY_CARE_PROVIDER_SITE_OTHER): Payer: PRIVATE HEALTH INSURANCE | Admitting: Neurology

## 2016-11-03 ENCOUNTER — Encounter (INDEPENDENT_AMBULATORY_CARE_PROVIDER_SITE_OTHER): Payer: Self-pay

## 2016-11-03 DIAGNOSIS — R200 Anesthesia of skin: Secondary | ICD-10-CM

## 2016-11-03 DIAGNOSIS — G5600 Carpal tunnel syndrome, unspecified upper limb: Secondary | ICD-10-CM

## 2016-11-03 DIAGNOSIS — G5602 Carpal tunnel syndrome, left upper limb: Secondary | ICD-10-CM

## 2016-11-03 DIAGNOSIS — Z0289 Encounter for other administrative examinations: Secondary | ICD-10-CM

## 2016-11-03 NOTE — Progress Notes (Signed)
Full Name: [name] [name] Gender: Female MRN #: [id number] Date of Birth: 1956-01-01    Visit Date: 11/03/2016 09:12 Age: 61 Years 0 Months Old Examining Physician: Sarina Ill, MD  Referring Physician:  Marguerita Merles, MD  History: Left hand numbness and tingling  Summary:  Summary:   Nerve Conduction Studies were performed on the left upper extremity.  The left Median 2nd Digit sensory nerve showed borderline distal peak latency (3.4 ms, N<3.4).   The left median/ulnar (palm) comparison nerve showed prolonged distal peak latency (Median Palm, 2.5 ms, N<2.2) and abnormal peak latency difference (Median Palm-Ulnar Palm, 0.4 ms, N<0.4) with a relative median delay.   All remaining nerves (as indicated in the following tables) were within normal limits.   All muscles (as indicated in the following tables) were within normal limits.      Conclusion: There is mild left Carpal Tunnel Syndrome  Sarina Ill, M.D.  CC: [facility] [place],  [address] Tel: [phone] Fax: [phone]      [facility]    Nerve / Sites Rec. Site Peak Lat Ref.  Amp Ref. Segments Distance Peak Diff Ref.    ms ms V V  cm ms ms  L Median, Ulnar - Transcarpal comparison     Median Palm Wrist 2.5 ?2.2 67 ?35 Median Palm - Wrist 8       Ulnar Palm Wrist 2.1 ?2.2 30 ?12 Ulnar Palm - Wrist 8          Median Palm - Ulnar Palm  0.4 ?0.4  L Median - Orthodromic (Dig II, Mid palm)     Dig II Wrist 3.4 ?3.4 20 ?10 Dig II - Wrist 13    L Ulnar - Orthodromic, (Dig V, Mid palm)     Dig V Wrist 2.9 ?3.1 11 ?5 Dig V - Wrist 11       [facility]    Nerve / Sites Muscle Latency Ref. Amplitude Ref. Rel Amp Segments Distance Velocity Ref. Area    ms ms mV mV %  cm m/s m/s mVms  L Median - APB     Wrist APB 3.4 ?4.4 8.7 ?4.0 100 Wrist - APB 7   33.6     Upper arm APB 7.3  8.3  95.8 Upper arm - Wrist 20 51 ?49 31.9  L Ulnar - ADM     Wrist ADM 2.5 ?3.3 6.5 ?6.0 100  Wrist - ADM 7   23.7     B.Elbow ADM 5.5  6.3  96.9 B.Elbow - Wrist 17 56 ?49 23.5     A.Elbow ADM 7.2  6.2  97.5 A.Elbow - B.Elbow 10 58 ?49 22.9         A.Elbow - Wrist         F  Wave    Nerve F Lat Ref.   ms ms  L Ulnar - ADM 25.9 ?32.0     EMG full       EMG Summary Table    Spontaneous MUAP Recruitment  Muscle IA Fib PSW Fasc Other Amp Dur. Poly Pattern  L. Deltoid Normal None None None _______ Normal Normal Normal Normal  L. Trapezius Normal None None None _______ Normal Normal Normal Normal  L. Pronator teres Normal None None None _______ Normal Normal Normal Normal  L. Flexor digitorum profundus (Ulnar) Normal None None None _______ Normal Normal Normal Normal  L. First dorsal interosseous Normal None  None None _______ Normal Normal Normal Normal  L. Opponens pollicis Normal None None None _______ Normal Normal Normal Normal  L. Cervical paraspinals (low) Normal None None None _______ Normal Normal Normal Normal

## 2016-11-03 NOTE — Addendum Note (Signed)
Addended by: Sarina Ill B on: 11/03/2016 08:32 PM   Modules accepted: Orders

## 2016-11-03 NOTE — Progress Notes (Signed)
See procedure note.

## 2016-11-03 NOTE — Procedures (Signed)
Full Name: [name] [name] Gender: Female MRN #: [id number] Date of Birth: 1956/04/08    Visit Date: 11/03/2016 09:12 Age: 62 Years 0 Months Old Examining Physician: Sarina Ill, MD  Referring Physician:  Marguerita Merles, MD  History: Left hand numbness and tingling  Summary:  Summary:   Nerve Conduction Studies were performed on the left upper extremity.  The left Median 2nd Digit sensory nerve showed borderline distal peak latency (3.4 ms, N<3.4).   The left median/ulnar (palm) comparison nerve showed prolonged distal peak latency (Median Palm, 2.5 ms, N<2.2) and abnormal peak latency difference (Median Palm-Ulnar Palm, 0.4 ms, N<0.4) with a relative median delay.   All remaining nerves (as indicated in the following tables) were within normal limits.   All muscles (as indicated in the following tables) were within normal limits.      Conclusion: There is mild left Carpal Tunnel Syndrome  Sarina Ill, M.D.  CC: [facility] [place], [place] [address] Tel: [phone] Fax: [phone]      [facility]    Nerve / Sites Rec. Site Peak Lat Ref.  Amp Ref. Segments Distance Peak Diff Ref.    ms ms V V  cm ms ms  L Median, Ulnar - Transcarpal comparison     Median Palm Wrist 2.5 ?2.2 67 ?35 Median Palm - Wrist 8       Ulnar Palm Wrist 2.1 ?2.2 30 ?12 Ulnar Palm - Wrist 8          Median Palm - Ulnar Palm  0.4 ?0.4  L Median - Orthodromic (Dig II, Mid palm)     Dig II Wrist 3.4 ?3.4 20 ?10 Dig II - Wrist 13    L Ulnar - Orthodromic, (Dig V, Mid palm)     Dig V Wrist 2.9 ?3.1 11 ?5 Dig V - Wrist 11       [facility]    Nerve / Sites Muscle Latency Ref. Amplitude Ref. Rel Amp Segments Distance Velocity Ref. Area    ms ms mV mV %  cm m/s m/s mVms  L Median - APB     Wrist APB 3.4 ?4.4 8.7 ?4.0 100 Wrist - APB 7   33.6     Upper arm APB 7.3  8.3  95.8 Upper arm - Wrist 20 51 ?49 31.9  L Ulnar - ADM     Wrist ADM 2.5 ?3.3 6.5 ?6.0 100  Wrist - ADM 7   23.7     B.Elbow ADM 5.5  6.3  96.9 B.Elbow - Wrist 17 56 ?49 23.5     A.Elbow ADM 7.2  6.2  97.5 A.Elbow - B.Elbow 10 58 ?49 22.9         A.Elbow - Wrist         F  Wave    Nerve F Lat Ref.   ms ms  L Ulnar - ADM 25.9 ?32.0     EMG full       EMG Summary Table    Spontaneous MUAP Recruitment  Muscle IA Fib PSW Fasc Other Amp Dur. Poly Pattern  L. Deltoid Normal None None None _______ Normal Normal Normal Normal  L. Trapezius Normal None None None _______ Normal Normal Normal Normal  L. Pronator teres Normal None None None _______ Normal Normal Normal Normal  L. Flexor digitorum profundus (Ulnar) Normal None None None _______ Normal Normal Normal Normal  L. First dorsal interosseous Normal None  None None _______ Normal Normal Normal Normal  L. Opponens pollicis Normal None None None _______ Normal Normal Normal Normal  L. Cervical paraspinals (low) Normal None None None _______ Normal Normal Normal Normal

## 2017-04-07 ENCOUNTER — Encounter: Payer: Self-pay | Admitting: *Deleted

## 2017-04-10 ENCOUNTER — Encounter: Payer: Self-pay | Admitting: Neurology

## 2017-04-10 ENCOUNTER — Ambulatory Visit (INDEPENDENT_AMBULATORY_CARE_PROVIDER_SITE_OTHER): Payer: PRIVATE HEALTH INSURANCE | Admitting: Neurology

## 2017-04-10 VITALS — BP 129/81 | HR 95 | Ht 65.0 in | Wt 161.2 lb

## 2017-04-10 DIAGNOSIS — R4701 Aphasia: Secondary | ICD-10-CM | POA: Diagnosis not present

## 2017-04-10 DIAGNOSIS — R29818 Other symptoms and signs involving the nervous system: Secondary | ICD-10-CM

## 2017-04-10 DIAGNOSIS — R413 Other amnesia: Secondary | ICD-10-CM | POA: Diagnosis not present
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
## 2017-04-10 NOTE — Patient Instructions (Signed)
Remember to drink plenty of fluid, eat healthy meals and do not skip any meals. Try to eat protein with a every meal and eat a healthy snack such as fruit or nuts in between meals. Try to keep a regular sleep-wake schedule and try to exercise daily, particularly in the form of walking, 20-30 minutes a day, if you can.   As far as diagnostic testing: MRI brain  I would like to see you back in 3-4 months, sooner if we need to. Please call us with any interim questions, concerns, problems, updates or refill requests.   Our phone number is [phone]. We also have an after hours call service for urgent matters and there is a physician on-call for urgent questions. For any emergencies you know to call 911 or go to the nearest emergency room   Stress and Stress Management Stress is a normal reaction to life events. It is what you feel when life demands more than you are used to or more than you can handle. Some stress can be useful. For example, the stress reaction can help you catch the last bus of the day, study for a test, or meet a deadline at work. But stress that occurs too often or for too long can cause problems. It can affect your emotional health and interfere with relationships and normal daily activities. Too much stress can weaken your immune system and increase your risk for physical illness. If you already have a medical problem, stress can make it worse. What are the causes? All sorts of life events may cause stress. An event that causes stress for one person may not be stressful for another person. Major life events commonly cause stress. These may be positive or negative. Examples include losing your job, moving into a new home, getting married, having a baby, or losing a loved one. Less obvious life events may also cause stress, especially if they occur day after day or in combination. Examples include working long hours, driving in traffic, caring for children, being in debt, or being in  a difficult relationship. What are the signs or symptoms? Stress may cause emotional symptoms including, the following:  Anxiety. This is feeling worried, afraid, on edge, overwhelmed, or out of control.  Anger. This is feeling irritated or impatient.  Depression. This is feeling sad, down, helpless, or guilty.  Difficulty focusing, remembering, or making decisions.  Stress may cause physical symptoms, including the following:  Aches and pains. These may affect your head, neck, back, stomach, or other areas of your body.  Tight muscles or clenched jaw.  Low energy or trouble sleeping.  Stress may cause unhealthy behaviors, including the following:  Eating to feel better (overeating) or skipping meals.  Sleeping too little, too much, or both.  Working too much or putting off tasks (procrastination).  Smoking, drinking alcohol, or using drugs to feel better.  How is this diagnosed? Stress is diagnosed through an assessment by your health care provider. Your health care provider will ask questions about your symptoms and any stressful life events.Your health care provider will also ask about your medical history and may order blood tests or other tests. Certain medical conditions and medicine can cause physical symptoms similar to stress. Mental illness can cause emotional symptoms and unhealthy behaviors similar to stress. Your health care provider may refer you to a mental health professional for further evaluation. How is this treated? Stress management is the recommended treatment for stress.The goals of stress management are reducing  stressful life events and coping with stress in healthy ways. Techniques for reducing stressful life events include the following:  Stress identification. Self-monitor for stress and identify what causes stress for you. These skills may help you to avoid some stressful events.  Time management. Set your priorities, keep a calendar of events, and  learn to say "no." These tools can help you avoid making too many commitments.  Techniques for coping with stress include the following:  Rethinking the problem. Try to think realistically about stressful events rather than ignoring them or overreacting. Try to find the positives in a stressful situation rather than focusing on the negatives.  Exercise. Physical exercise can release both physical and emotional tension. The key is to find a form of exercise you enjoy and do it regularly.  Relaxation techniques. These relax the body and mind. Examples include yoga, meditation, tai chi, biofeedback, deep breathing, progressive muscle relaxation, listening to music, being out in nature, journaling, and other hobbies. Again, the key is to find one or more that you enjoy and can do regularly.  Healthy lifestyle. Eat a balanced diet, get plenty of sleep, and do not smoke. Avoid using alcohol or drugs to relax.  Strong support network. Spend time with family, friends, or other people you enjoy being around.Express your feelings and talk things over with someone you trust.  Counseling or talktherapy with a mental health professional may be helpful if you are having difficulty managing stress on your own. Medicine is typically not recommended for the treatment of stress.Talk to your health care provider if you think you need medicine for symptoms of stress. Follow these instructions at home:  Keep all follow-up visits as directed by your health care provider.  Take all medicines as directed by your health care provider. Contact a health care provider if:  Your symptoms get worse or you start having new symptoms.  You feel overwhelmed by your problems and can no longer manage them on your own. Get help right away if:  You feel like hurting yourself or someone else. This information is not intended to replace advice given to you by your health care provider. Make sure you discuss any questions you  have with your health care provider. Document Released: 01/25/2001 Document Revised: 01/07/2016 Document Reviewed: 03/26/2013 Elsevier Interactive Patient Education  2017 [employer].

## 2017-04-11 ENCOUNTER — Telehealth: Payer: Self-pay | Admitting: *Deleted

## 2017-04-11 LAB — B12 AND FOLATE PANEL
Folate: >20 ng/mL (ref >3.0)
Vitamin B-12: 556 pg/mL (ref 232–1245)

## 2017-04-11 LAB — VITAMIN D 25 HYDROXY (VIT D DEFICIENCY, FRACTURES): Vit D, 25-Hydroxy: 39.6 ng/mL (ref 30.0–100.0)

## 2017-04-11 NOTE — Telephone Encounter (Signed)
-----   Message from Melvenia Beam, MD sent at 04/11/2017  8:53 AM EDT ----- Labs normal

## 2017-04-11 NOTE — Telephone Encounter (Signed)
Called and LVM for pt about normal labs per AA,MD note. Gave [facility] phone number if she has further questions.

## 2017-04-27 ENCOUNTER — Emergency Department
Admission: EM | Admit: 2017-04-27 | Discharge: 2017-04-27 | Disposition: A | Discharge status: Home / Self Care | Payer: PRIVATE HEALTH INSURANCE | Attending: Emergency Medicine | Admitting: Emergency Medicine

## 2017-04-27 ENCOUNTER — Emergency Department: Payer: PRIVATE HEALTH INSURANCE

## 2017-04-27 DIAGNOSIS — W01198A Fall on same level from slipping, tripping and stumbling with subsequent striking against other object, initial encounter: Secondary | ICD-10-CM | POA: Insufficient documentation

## 2017-04-27 DIAGNOSIS — S92515A Nondisplaced fracture of proximal phalanx of left lesser toe(s), initial encounter for closed fracture: Secondary | ICD-10-CM | POA: Insufficient documentation

## 2017-04-27 DIAGNOSIS — Z79899 Other long term (current) drug therapy: Secondary | ICD-10-CM | POA: Diagnosis not present

## 2017-04-27 DIAGNOSIS — Y93K9 Activity, other involving animal care: Secondary | ICD-10-CM | POA: Diagnosis not present

## 2017-04-27 DIAGNOSIS — E039 Hypothyroidism, unspecified: Secondary | ICD-10-CM | POA: Diagnosis not present

## 2017-04-27 DIAGNOSIS — S92415A Nondisplaced fracture of proximal phalanx of left great toe, initial encounter for closed fracture: Secondary | ICD-10-CM | POA: Insufficient documentation

## 2017-04-27 DIAGNOSIS — Y929 Unspecified place or not applicable: Secondary | ICD-10-CM | POA: Diagnosis not present

## 2017-04-27 DIAGNOSIS — Y999 Unspecified external cause status: Secondary | ICD-10-CM | POA: Diagnosis not present

## 2017-04-27 DIAGNOSIS — Z87891 Personal history of nicotine dependence: Secondary | ICD-10-CM | POA: Insufficient documentation

## 2017-04-27 DIAGNOSIS — Z7982 Long term (current) use of aspirin: Secondary | ICD-10-CM | POA: Diagnosis not present

## 2017-04-27 DIAGNOSIS — S99922A Unspecified injury of left foot, initial encounter: Secondary | ICD-10-CM | POA: Diagnosis present

## 2017-04-27 MED ORDER — OXYCODONE-ACETAMINOPHEN 5-325 MG PO TABS: 1.0000 | ORAL_TABLET | Freq: Four times a day (QID) | ORAL | 0 refills | Status: AC | PRN

## 2017-04-27 NOTE — ED Triage Notes (Signed)
Pt in with co left foot pain states fell against a wall and toes bent upward. Went to work and was able to walk, states blisters have developed over the bruising on the toes.

## 2017-04-28 NOTE — ED Provider Notes (Signed)
[facility] Emergency Department Provider Note  ____________________________________________  Time seen: Approximately 12:04 AM  I have reviewed the triage vital signs and the nursing notes.   HISTORY  Chief Complaint Foot Pain    HPI [name] [name] is a 61 y.o. female presenting to the emergency department with left foot pain and hemorrhagic bulla after patient went to feed her cats, slipped and her toes hyperextended against a wall one day ago. She did not hit her head or lose consciousness. Patient denies hematuria or back pain. Patient denies weakness and radiculopathy. She has been ambulating with some pain. Patient currently rates her pain at 7 out of 10 in intensity. Patient states that she has been using an ortho boot. Patient presents to the emergency department as she is concerned for hemorrhagic bulla.   Past Medical History:  Diagnosis Date  . Actinic keratosis   . Acute hepatitis C without mention of hepatic coma(070.51)   . Depression   . GERD (gastroesophageal reflux disease)   . Hypothyroidism   . Insomnia   . Insomnia   . Memory loss   . Osteoporosis   . Tremor     Patient Active Problem List   Diagnosis Date Noted  . Hepatitis C 08/19/2016  . Depression 08/19/2016  . Insomnia 08/19/2016  . Hypothyroidism 08/19/2016    Past Surgical History:  Procedure Laterality Date  . ANKLE SURGERY Right   . BLADDER TACT    . HAND SURGERY Right   . TONSILLECTOMY      Prior to Admission medications   Medication Sig Start Date End Date Taking? Authorizing Provider  amitriptyline (ELAVIL) 50 MG tablet Take 150 mg by mouth at bedtime as needed for sleep.     [provider]  aspirin EC 81 MG tablet Take 81 mg by mouth daily.    [provider]  buPROPion (WELLBUTRIN SR) 150 MG 12 hr tablet Take 150 mg by mouth 3 (three) times daily.     [provider]  calcium carbonate (OS-CAL) 600 MG TABS tablet Take 600 mg by  mouth 2 (two) times daily with a meal.    [provider]  Cholecalciferol (HM VITAMIN D3) 4000 units CAPS Take 4,000 Units by mouth daily.    [provider]  docusate sodium (COLACE) 100 MG capsule Take 100 mg by mouth 3 (three) times daily. 1 by mouth TID    [provider]  levothyroxine (SYNTHROID, LEVOTHROID) 50 MCG tablet Take 50 mcg by mouth daily before breakfast.    [provider]  lurasidone (LATUDA) 40 MG TABS tablet Take 40 mg by mouth daily at 12 noon.    [provider]  Multiple Vitamins-Minerals (MULTIVITAL-M) TABS Take 1 tablet by mouth daily.     [provider]  omeprazole (PRILOSEC) 20 MG capsule Take 20 mg by mouth daily.    [provider]  oxyCODONE-acetaminophen (ROXICET) 5-325 MG tablet Take 1 tablet by mouth every 6 (six) hours as needed for severe pain. 04/27/17 05/02/17  Lannie Fields, PA-C  phentermine 37.5 MG capsule Take 37.5 mg by mouth every morning.    [provider]  tretinoin (RETIN-A) 0.05 % cream Apply 0.05 % topically at bedtime.    [provider]  venlafaxine (EFFEXOR) 75 MG tablet Take 75 mg by mouth. 2 tabs in AM and 2 tabs in afternoon and 1 tab at qhs    [provider]    Allergies Sonata [zaleplon]  Family History  Problem Relation Age of Onset  . Dementia Mother   . Neuropathy Neg Hx   . Tremor Neg Hx     Social History Social History  Substance Use Topics  . Smoking status: Former Smoker    Packs/day: 1.00    Quit date: 07/14/2016  . Smokeless tobacco: Never Used  . Alcohol use No     Review of Systems  Constitutional: No fever/chills Eyes: No visual changes. No discharge ENT: No upper respiratory complaints. Cardiovascular: no chest pain. Respiratory: no cough. No SOB. Gastrointestinal: No abdominal pain.  No nausea, no vomiting.  No diarrhea.  No constipation. Musculoskeletal: Patient has left foot pain.  Skin: Patient has  hemorrhagic bulla Neurological: Negative for headaches, focal weakness or numbness.   ____________________________________________   PHYSICAL EXAM:  VITAL SIGNS: ED Triage Vitals  Enc Vitals Group     BP 04/27/17 2050 (!) 142/76     Pulse Rate 04/27/17 2050 95     Resp 04/27/17 2050 20     Temp 04/27/17 2050 98.5 F (36.9 C)     Temp Source 04/27/17 2050 Oral     SpO2 04/27/17 2050 97 %     Weight 04/27/17 2047 161 lb (73 kg)     Height 04/27/17 2047 5\' 5"  (1.651 m)     Head Circumference --      Peak Flow --      Pain Score 04/27/17 2046 7     Pain Loc --      Pain Edu? --      Excl. in [place]? --      Constitutional: Alert and oriented. Well appearing and in no acute distress. Eyes: Conjunctivae are normal. PERRL. EOMI. Head: Atraumatic. Cardiovascular: Normal rate, regular rhythm. Normal S1 and S2.  Good peripheral circulation. Respiratory: Normal respiratory effort without tachypnea or retractions. Lungs CTAB. Good air entry to the bases with no decreased or absent breath sounds. Musculoskeletal: Patient is able to move all 5 left toes. Neurologic:  Normal speech and language. No gross focal neurologic deficits are appreciated.  Skin: Patient has hemorrhagic bulla visualized over left lower extremity, digits 1 through 3.  ____________________________________________   LABS (all labs ordered are listed, but only abnormal results are displayed)  Labs Reviewed - No data to display ____________________________________________  EKG   ____________________________________________  RADIOLOGY Unk Pinto, personally viewed and evaluated these images (plain radiographs) as part of my medical decision making, as well as reviewing the written report by the radiologist.  Dg Foot Complete Left  Result Date: 04/27/2017 CLINICAL DATA:  Bruising about the toes and lateral aspect of the left foot in the patient inter toes back after stepping in cat food yesterday. Initial  encounter. EXAM: LEFT FOOT - COMPLETE 3+ VIEW COMPARISON:  MRI left foot 02/06/2014. FINDINGS: The patient has an acute nondisplaced fracture through the midshaft of the proximal phalanx of the great toe. There is also a nondisplaced fracture through the base of the proximal phalanx of the third toe on the medial side. This fracture extends to the articular surface. No other fracture is identified. Talonavicular osteoarthritis is noted. IMPRESSION: Acute fractures of the proximal phalanx of the great toe and proximal phalanx of the third toe as described above. Electronically Signed   By: Inge Rise M.D.   On: 04/27/2017 21:29    ____________________________________________    PROCEDURES  Procedure(s) performed:    Procedures    Medications - No data to display  ____________________________________________   INITIAL IMPRESSION / ASSESSMENT AND PLAN / ED COURSE  Pertinent labs & imaging results that were available during my care of the patient were reviewed by me and considered in my medical decision making (see chart for details).  Review of the [employer] CSRS was performed in accordance of the [place] prior to dispensing any controlled drugs.     Assessment and plan Proximal phalanx fractures Hemorrhagic bulla  Patient presents the emergency department with left foot pain. DG left foot reveals proximal phalanx fractures of the left first and third digits. Patient also has associated fracture blisters, hemorrhagic in nature. Dr. Vickki Muff was consulted regarding patient's case. Supportive care was recommended for hemorrhagic bulla and patient was advised to follow-up with Dr. Vickki Muff as an outpatient. Patient was advised to make an appointment as soon as possible. Vital signs are reassuring prior to discharge. Patient was discharged with Roxicet. All patient questions were answered.   ____________________________________________  FINAL CLINICAL IMPRESSION(S) / ED DIAGNOSES  Final  diagnoses:  Closed nondisplaced fracture of proximal phalanx of left great toe, initial encounter  Nondisplaced fracture of proximal phalanx of left lesser toe(s), initial encounter for closed fracture      NEW MEDICATIONS STARTED DURING THIS VISIT:  Discharge Medication List as of 04/27/2017 10:49 PM    START taking these medications   Details  oxyCODONE-acetaminophen (ROXICET) 5-325 MG tablet Take 1 tablet by mouth every 6 (six) hours as needed for severe pain., Starting Thu 04/27/2017, Until Tue 05/02/2017, Print            This chart was dictated using voice recognition software/Dragon. Despite best efforts to proofread, errors can occur which can change the meaning. Any change was purely unintentional.    Lannie Fields, PA-C 04/28/17 Jen Mow    Arta Silence, MD 04/29/17 [phone]

## 2017-05-01 ENCOUNTER — Institutional Professional Consult (permissible substitution): Payer: PRIVATE HEALTH INSURANCE | Admitting: Neurology

## 2017-05-04 ENCOUNTER — Ambulatory Visit
Admission: RE | Admit: 2017-05-04 | Discharge: 2017-05-04 | Disposition: A | Discharge status: Home / Self Care | Payer: No Typology Code available for payment source | Source: Ambulatory Visit | Attending: Neurology | Admitting: Neurology

## 2017-05-04 DIAGNOSIS — R4701 Aphasia: Secondary | ICD-10-CM

## 2017-05-04 DIAGNOSIS — R29818 Other symptoms and signs involving the nervous system: Secondary | ICD-10-CM

## 2017-05-04 DIAGNOSIS — R413 Other amnesia: Secondary | ICD-10-CM

## 2017-05-04 MED ORDER — GADOBENATE DIMEGLUMINE 529 MG/ML IV SOLN
15.0000 mL | Freq: Once | INTRAVENOUS | Status: AC | PRN
  Administered 2017-05-04: 15 mL via INTRAVENOUS

## 2017-05-05 ENCOUNTER — Telehealth: Payer: Self-pay | Admitting: *Deleted

## 2017-05-05 NOTE — Telephone Encounter (Signed)
Unable to LVM informing patient her MRI brain is normal.  Voice mailbox is full.

## 2017-05-05 NOTE — Telephone Encounter (Signed)
Pt returned Rn's call. Msg relayed MRI was normal. Pt understood and was appreciative

## 2017-07-19 ENCOUNTER — Ambulatory Visit: Payer: PRIVATE HEALTH INSURANCE | Admitting: Adult Health

## 2017-10-12 ENCOUNTER — Other Ambulatory Visit: Payer: Self-pay

## 2017-10-12 ENCOUNTER — Emergency Department
Admission: EM | Admit: 2017-10-12 | Discharge: 2017-10-12 | Disposition: A | Discharge status: Home / Self Care | Payer: Worker's Compensation | Attending: Emergency Medicine | Admitting: Emergency Medicine

## 2017-10-12 ENCOUNTER — Encounter: Payer: Self-pay | Admitting: Emergency Medicine

## 2017-10-12 DIAGNOSIS — X500XXA Overexertion from strenuous movement or load, initial encounter: Secondary | ICD-10-CM | POA: Insufficient documentation

## 2017-10-12 DIAGNOSIS — S39012A Strain of muscle, fascia and tendon of lower back, initial encounter: Secondary | ICD-10-CM | POA: Diagnosis present

## 2017-10-12 DIAGNOSIS — Y9289 Other specified places as the place of occurrence of the external cause: Secondary | ICD-10-CM | POA: Insufficient documentation

## 2017-10-12 DIAGNOSIS — E039 Hypothyroidism, unspecified: Secondary | ICD-10-CM | POA: Diagnosis not present

## 2017-10-12 DIAGNOSIS — Z87891 Personal history of nicotine dependence: Secondary | ICD-10-CM | POA: Insufficient documentation

## 2017-10-12 DIAGNOSIS — Y999 Unspecified external cause status: Secondary | ICD-10-CM | POA: Diagnosis not present

## 2017-10-12 DIAGNOSIS — Y93F2 Activity, caregiving, lifting: Secondary | ICD-10-CM | POA: Insufficient documentation

## 2017-10-12 DIAGNOSIS — Z7982 Long term (current) use of aspirin: Secondary | ICD-10-CM | POA: Diagnosis not present

## 2017-10-12 DIAGNOSIS — Z79899 Other long term (current) drug therapy: Secondary | ICD-10-CM | POA: Diagnosis not present

## 2017-10-12 MED ORDER — BACLOFEN 10 MG PO TABS: 10.0000 mg | ORAL_TABLET | Freq: Every day | ORAL | 1 refills | Status: AC

## 2017-10-12 MED ORDER — MELOXICAM 15 MG PO TABS: 15.0000 mg | ORAL_TABLET | Freq: Every day | ORAL | 2 refills | Status: AC

## 2017-10-12 NOTE — Discharge Instructions (Signed)
Follow-up with Dr. Sabra Heck or a orthopedic of workers comp choice if you are not better in 5-7 days.  He will be on light duty for 7 days.  No lifting of greater than 10 pounds.  Use the meloxicam for pain and inflammation.  The baclofen is a muscle relaxer.  Be careful using this medication with your amitriptyline.  I would prefer to use these at different times.  Use ice on the lower back for several days.  You can also use wet heat followed by ice.  If you are worsening please return to the emergency department

## 2017-10-12 NOTE — ED Provider Notes (Signed)
[facility] Emergency Department Provider Note  ____________________________________________   First MD Initiated Contact with Patient 10/12/17 2037     (approximate)  I have reviewed the triage vital signs and the nursing notes.   HISTORY  Chief Complaint Back Pain    HPI [name] [name] is a 62 y.o. female who presents to the emergency department after an injury while at work.  She states she works at [employer].  She was with the patient that started to slide out of the wheelchair.  She tried to help pull her back and the patient is dead weight.  She felt a strain in her lower back at the time and is continued to have pain today.  She denies any numbness or tingling in her lower legs.  She denies any other injury.  Past Medical History:  Diagnosis Date  . Actinic keratosis   . Acute hepatitis C without mention of hepatic coma(070.51)   . Depression   . GERD (gastroesophageal reflux disease)   . Hypothyroidism   . Insomnia   . Insomnia   . Memory loss   . Osteoporosis   . Tremor     Patient Active Problem List   Diagnosis Date Noted  . Hepatitis C 08/19/2016  . Depression 08/19/2016  . Insomnia 08/19/2016  . Hypothyroidism 08/19/2016    Past Surgical History:  Procedure Laterality Date  . ANKLE SURGERY Right   . BLADDER TACT    . HAND SURGERY Right   . TONSILLECTOMY      Prior to Admission medications   Medication Sig Start Date End Date Taking? Authorizing Provider  amitriptyline (ELAVIL) 50 MG tablet Take 150 mg by mouth at bedtime as needed for sleep.     [provider]  aspirin EC 81 MG tablet Take 81 mg by mouth daily.    [provider]  baclofen (LIORESAL) 10 MG tablet Take 1 tablet (10 mg total) by mouth daily. 10/12/17 10/12/18  Caryn Section, Linden Dolin, PA-C  buPROPion (WELLBUTRIN SR) 150 MG 12 hr tablet Take 150 mg by mouth 3 (three) times daily.     [provider]  calcium carbonate (OS-CAL) 600 MG  TABS tablet Take 600 mg by mouth 2 (two) times daily with a meal.    [provider]  Cholecalciferol (HM VITAMIN D3) 4000 units CAPS Take 4,000 Units by mouth daily.    [provider]  docusate sodium (COLACE) 100 MG capsule Take 100 mg by mouth 3 (three) times daily. 1 by mouth TID    [provider]  levothyroxine (SYNTHROID, LEVOTHROID) 50 MCG tablet Take 50 mcg by mouth daily before breakfast.    [provider]  lurasidone (LATUDA) 40 MG TABS tablet Take 40 mg by mouth daily at 12 noon.    [provider]  meloxicam (MOBIC) 15 MG tablet Take 1 tablet (15 mg total) by mouth daily. 10/12/17 10/12/18  Caryn Section Linden Dolin, PA-C  Multiple Vitamins-Minerals (MULTIVITAL-M) TABS Take 1 tablet by mouth daily.     [provider]  omeprazole (PRILOSEC) 20 MG capsule Take 20 mg by mouth daily.    [provider]  phentermine 37.5 MG capsule Take 37.5 mg by mouth every morning.    [provider]  tretinoin (RETIN-A) 0.05 % cream Apply 0.05 % topically at bedtime.    [provider]  venlafaxine (EFFEXOR) 75 MG tablet Take 75 mg by mouth. 2 tabs in AM and 2 tabs in afternoon  and 1 tab at qhs    [provider]    Allergies Sonata [zaleplon]  Family History  Problem Relation Age of Onset  . Dementia Mother   . Neuropathy Neg Hx   . Tremor Neg Hx     Social History Social History   Tobacco Use  . Smoking status: Former Smoker    Packs/day: 1.00    Last attempt to quit: 07/14/2016    Years since quitting: 1.2  . Smokeless tobacco: Never Used  Substance Use Topics  . Alcohol use: No  . Drug use: No    Review of Systems  Constitutional: No fever/chills Eyes: No visual changes. ENT: No sore throat. Respiratory: Denies cough Genitourinary: Negative for dysuria. Musculoskeletal: Positive for back pain. Skin: Negative for rash.    ____________________________________________   PHYSICAL  EXAM:  VITAL SIGNS: ED Triage Vitals  Enc Vitals Group     BP 10/12/17 2027 138/77     Pulse Rate 10/12/17 2027 91     Resp 10/12/17 2027 18     Temp 10/12/17 2027 98.4 F (36.9 C)     Temp Source 10/12/17 2027 Oral     SpO2 10/12/17 2027 100 %     Weight 10/12/17 2028 165 lb (74.8 kg)     Height 10/12/17 2028 5\' 5"  (1.651 m)     Head Circumference --      Peak Flow --      Pain Score 10/12/17 2028 8     Pain Loc --      Pain Edu? --      Excl. in [place]? --     Constitutional: Alert and oriented. Well appearing and in no acute distress. Eyes: Conjunctivae are normal.  Head: Atraumatic. Nose: No congestion/rhinnorhea. Mouth/Throat: Mucous membranes are moist.   Cardiovascular: Normal rate, regular rhythm.  Heart sounds are normal Respiratory: Normal respiratory effort.  No retractions, lungs clear to auscultation GU: deferred Musculoskeletal: FROM all extremities, warm and well perfused, patient's lumbar area is tender in the paravertebral muscles.  She is able to bend forward to 90 degrees without difficulty.  The pain is reproduced with hyperextension.  She can stand on her toes and on her heels without difficulty.  She is neurovascularly intact.  He walks with a steady gait. Neurologic:  Normal speech and language.  Skin:  Skin is warm, dry and intact. No rash noted. Psychiatric: Mood and affect are normal. Speech and behavior are normal.  ____________________________________________   LABS (all labs ordered are listed, but only abnormal results are displayed)  Labs Reviewed - No data to display ____________________________________________   ____________________________________________  RADIOLOGY    ____________________________________________   PROCEDURES  Procedure(s) performed: No  Procedures    ____________________________________________   INITIAL IMPRESSION / ASSESSMENT AND PLAN / ED COURSE  Pertinent labs & imaging results that were available  during my care of the patient were reviewed by me and considered in my medical decision making (see chart for details).  Patient is a 62 year old female who complains of a back strain due to lifting a patient while at work.  On physical exam paravertebral muscles and lumbar spine are tender.  She has good range of motion but pain is reproduced with hyperextension.  Diagnosis is acute lumbar strain.  She was given light duty for 7 days.  She is not to lift anything greater than 10 pounds.  She is to use ice for the next 3 days to the lower back.  Then she  can switch to wet heat followed by ice.  She was given medication to help with muscle strain and pain.  She is to follow-up with orthopedics if she is not better in 5 days.  She was instructed to follow-up either with Dr. Sabra Heck or a workers comp approved orthopedic.  She did not require drug screen per the [employer] paperwork.  She was discharged in stable condition with a note for light duty.     As part of my medical decision making, I reviewed the following data within the [place] notes reviewed and incorporated, Notes from prior ED visits and [place] Controlled Substance Database  ____________________________________________   FINAL CLINICAL IMPRESSION(S) / ED DIAGNOSES  Final diagnoses:  Strain of lumbar region, initial encounter      NEW MEDICATIONS STARTED DURING THIS VISIT:  New Prescriptions   BACLOFEN (LIORESAL) 10 MG TABLET    Take 1 tablet (10 mg total) by mouth daily.   MELOXICAM (MOBIC) 15 MG TABLET    Take 1 tablet (15 mg total) by mouth daily.     Note:  This document was prepared using Dragon voice recognition software and may include unintentional dictation errors.    Versie Starks, PA-C 10/12/17 2053    Nance Pear, MD 10/12/17 2102

## 2017-10-12 NOTE — ED Notes (Signed)

## 2017-10-12 NOTE — ED Triage Notes (Signed)
Pt presents to ED with c/o lower back pain. Onset this evening while at work. 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 Ambulatory to triage with steady gait. No distress noted at this time.

## 2017-10-12 NOTE — ED Notes (Signed)
No UDS or ETOH per WC profile

## 2019-04-11 ENCOUNTER — Emergency Department: Payer: Worker's Compensation

## 2019-04-11 ENCOUNTER — Encounter: Payer: Self-pay | Admitting: Emergency Medicine

## 2019-04-11 ENCOUNTER — Emergency Department
Admission: EM | Admit: 2019-04-11 | Discharge: 2019-04-11 | Disposition: A | Discharge status: Home / Self Care | Payer: Worker's Compensation | Attending: Emergency Medicine | Admitting: Emergency Medicine

## 2019-04-11 DIAGNOSIS — W01198A Fall on same level from slipping, tripping and stumbling with subsequent striking against other object, initial encounter: Secondary | ICD-10-CM | POA: Diagnosis not present

## 2019-04-11 DIAGNOSIS — S0083XA Contusion of other part of head, initial encounter: Secondary | ICD-10-CM | POA: Diagnosis not present

## 2019-04-11 DIAGNOSIS — S82035A Nondisplaced transverse fracture of left patella, initial encounter for closed fracture: Secondary | ICD-10-CM

## 2019-04-11 DIAGNOSIS — S8992XA Unspecified injury of left lower leg, initial encounter: Secondary | ICD-10-CM | POA: Diagnosis present

## 2019-04-11 DIAGNOSIS — Y990 Civilian activity done for income or pay: Secondary | ICD-10-CM | POA: Insufficient documentation

## 2019-04-11 DIAGNOSIS — Y9289 Other specified places as the place of occurrence of the external cause: Secondary | ICD-10-CM | POA: Diagnosis not present

## 2019-04-11 DIAGNOSIS — Z7982 Long term (current) use of aspirin: Secondary | ICD-10-CM | POA: Diagnosis not present

## 2019-04-11 DIAGNOSIS — Y9301 Activity, walking, marching and hiking: Secondary | ICD-10-CM | POA: Insufficient documentation

## 2019-04-11 DIAGNOSIS — W19XXXA Unspecified fall, initial encounter: Secondary | ICD-10-CM

## 2019-04-11 DIAGNOSIS — R51 Headache: Secondary | ICD-10-CM | POA: Diagnosis not present

## 2019-04-11 DIAGNOSIS — Z79899 Other long term (current) drug therapy: Secondary | ICD-10-CM | POA: Diagnosis not present

## 2019-04-11 DIAGNOSIS — E039 Hypothyroidism, unspecified: Secondary | ICD-10-CM | POA: Diagnosis not present

## 2019-04-11 DIAGNOSIS — Z87891 Personal history of nicotine dependence: Secondary | ICD-10-CM | POA: Insufficient documentation

## 2019-04-11 MED ORDER — ACETAMINOPHEN 325 MG PO TABS
650.0000 mg | ORAL_TABLET | Freq: Once | ORAL | Status: AC
  Administered 2019-04-11: 22:00:00 650 mg via ORAL
  Filled 2019-04-11: qty 2

## 2019-04-11 MED ORDER — OXYCODONE-ACETAMINOPHEN 5-325 MG PO TABS: 1.0000 | ORAL_TABLET | Freq: Four times a day (QID) | ORAL | 0 refills | Status: DC | PRN

## 2019-04-11 MED ORDER — IBUPROFEN 600 MG PO TABS
600.0000 mg | ORAL_TABLET | Freq: Once | ORAL | Status: AC
  Administered 2019-04-11: 600 mg via ORAL
  Filled 2019-04-11: qty 1

## 2019-04-11 NOTE — ED Notes (Signed)
Pt employer requests a UDS for workers comp.  Consent obtained, urine specimen collected and released to lab via chain of custody protocol.  Specimen ID no [id number]

## 2019-04-11 NOTE — ED Triage Notes (Signed)
Pt reports she tripped over phone wire in residents room at work, landing on left knee and face/head. Pt denies LOC. Abrasions and bruising to bridge of nose and under left eye.

## 2019-04-11 NOTE — ED Notes (Addendum)
Reports tripped over telephone wire at work and hit face and knee. Presents with bruising to nasal bridge and forehead, c.o of pain to left knee. Denies using blood thinners.

## 2019-04-11 NOTE — ED Notes (Signed)
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 Pt brought over from [facility].

## 2019-04-11 NOTE — ED Provider Notes (Signed)
[facility] Emergency Department Provider Note  ____________________________________________  Time seen: Approximately 9:43 PM  I have reviewed the triage vital signs and the nursing notes.   HISTORY  Chief Complaint Fall    HPI [name] [name] is a 63 y.o. female who presents the emergency department after sustaining a fall at work.  Patient reports that a telephone cord was stretched across room and she did not see it, tripping over same.  Patient fell landing on her left knee and struck her face.  No loss of consciousness.  Patient does endorse a headache at this time but denies any neck pain, chest pain, shortness of breath, nausea or vomiting.  Patient also endorses sharp pain to the anterior aspect of the left knee.  She has very limited range of motion to the left knee due to pain but was able to stand on her knee after injury.  No medications prior to arrival.  No other complaints at this time.         Past Medical History:  Diagnosis Date  . Actinic keratosis   . Acute hepatitis C without mention of hepatic coma(070.51)   . Depression   . GERD (gastroesophageal reflux disease)   . Hypothyroidism   . Insomnia   . Insomnia   . Memory loss   . Osteoporosis   . Tremor     Patient Active Problem List   Diagnosis Date Noted  . Hepatitis C 08/19/2016  . Depression 08/19/2016  . Insomnia 08/19/2016  . Hypothyroidism 08/19/2016    Past Surgical History:  Procedure Laterality Date  . ANKLE SURGERY Right   . BLADDER TACT    . HAND SURGERY Right   . TONSILLECTOMY      Prior to Admission medications   Medication Sig Start Date End Date Taking? Authorizing Provider  amitriptyline (ELAVIL) 50 MG tablet Take 150 mg by mouth at bedtime as needed for sleep.     [provider]  aspirin EC 81 MG tablet Take 81 mg by mouth daily.    [provider]  buPROPion (WELLBUTRIN SR) 150 MG 12 hr tablet Take 150 mg by mouth 3 (three) times  daily.     [provider]  calcium carbonate (OS-CAL) 600 MG TABS tablet Take 600 mg by mouth 2 (two) times daily with a meal.    [provider]  Cholecalciferol (HM VITAMIN D3) 4000 units CAPS Take 4,000 Units by mouth daily.    [provider]  docusate sodium (COLACE) 100 MG capsule Take 100 mg by mouth 3 (three) times daily. 1 by mouth TID    [provider]  levothyroxine (SYNTHROID, LEVOTHROID) 50 MCG tablet Take 50 mcg by mouth daily before breakfast.    [provider]  lurasidone (LATUDA) 40 MG TABS tablet Take 40 mg by mouth daily at 12 noon.    [provider]  Multiple Vitamins-Minerals (MULTIVITAL-M) TABS Take 1 tablet by mouth daily.     [provider]  omeprazole (PRILOSEC) 20 MG capsule Take 20 mg by mouth daily.    [provider]  oxyCODONE-acetaminophen (PERCOCET/ROXICET) 5-325 MG tablet Take 1 tablet by mouth every 6 (six) hours as needed for severe pain. 04/11/19   Wilder Kurowski, Charline Bills, PA-C  phentermine 37.5 MG capsule Take 37.5 mg by mouth every morning.    [provider]  tretinoin (RETIN-A) 0.05 % cream Apply 0.05 % topically at bedtime.    [provider]  venlafaxine [facility])  75 MG tablet Take 75 mg by mouth. 2 tabs in AM and 2 tabs in afternoon and 1 tab at qhs    [provider]    Allergies Sonata [zaleplon]  Family History  Problem Relation Age of Onset  . Dementia Mother   . Neuropathy Neg Hx   . Tremor Neg Hx     Social History Social History   Tobacco Use  . Smoking status: Former Smoker    Packs/day: 1.00    Quit date: 07/14/2016    Years since quitting: 2.7  . Smokeless tobacco: Never Used  Substance Use Topics  . Alcohol use: No  . Drug use: No     Review of Systems  Constitutional: No fever/chills Eyes: No visual changes.  Cardiovascular: no chest pain. Respiratory: no cough. No SOB. Gastrointestinal: No abdominal pain.  No nausea,  no vomiting.   Musculoskeletal: Positive for left knee injury Skin: Negative for rash, abrasions, lacerations, ecchymosis. Neurological: Positive for headache status post fall, denies focal weakness or numbness. 10-point ROS otherwise negative.  ____________________________________________   PHYSICAL EXAM:  VITAL SIGNS: ED Triage Vitals [04/11/19 2003]  Enc Vitals Group     BP 125/69     Pulse Rate 84     Resp 18     Temp 99.1 F (37.3 C)     Temp Source Oral     SpO2 96 %     Weight      Height      Head Circumference      Peak Flow      Pain Score      Pain Loc      Pain Edu?      Excl. in [place]?      Constitutional: Alert and oriented. Well appearing and in no acute distress. Eyes: Conjunctivae are normal. PERRL. EOMI. Head: Bruising to the forehead, nasal bridge.  No open lacerations or abrasions noted.  No other significant visible signs of trauma to the skull or face.  No battle signs, no raccoon eyes, no serosanguineous fluid drainage from the ears or nares. ENT:      Ears:       Nose: No congestion/rhinnorhea.      Mouth/Throat: Mucous membranes are moist.  Neck: No stridor.  No cervical spine tenderness to palpation.  Cardiovascular: Normal rate, regular rhythm. Normal S1 and S2.  Good peripheral circulation. Respiratory: Normal respiratory effort without tachypnea or retractions. Lungs CTAB. Good air entry to the bases with no decreased or absent breath sounds. Musculoskeletal: Full range of motion to all extremities.  Limited range of motion to the left lower extremity about the left knee.  Mild edema of the left knee when compared with right.  No open wounds.  No active or passive range of motion at this time.  Patient is exquisitely tender to palpation over the patella with no other significant areas of tenderness.  No palpable abnormality.  No ballottement.  Examination of the hip and ankle is unremarkable.  Dorsalis pedis pulse intact distally.  Sensation intact  distally. Neurologic:  Normal speech and language. No gross focal neurologic deficits are appreciated.  Cranial nerves II through XII grossly intact at this time.  Negative pronator drift.  Romberg's is not tested given left lower extremity injury. Skin:  Skin is warm, dry and intact. No rash noted. Psychiatric: Mood and affect are normal. Speech and behavior are normal. Patient exhibits appropriate insight and judgement.   ____________________________________________   LABS (all labs  ordered are listed, but only abnormal results are displayed)  Labs Reviewed - No data to display ____________________________________________  EKG   ____________________________________________  RADIOLOGY I personally viewed and evaluated these images as part of my medical decision making, as well as reviewing the written report by the radiologist.  Ct Head Wo Contrast  Result Date: 04/11/2019 CLINICAL DATA:  Head trauma abrasion to the nose EXAM: CT HEAD WITHOUT CONTRAST CT CERVICAL SPINE WITHOUT CONTRAST TECHNIQUE: Multidetector CT imaging of the head and cervical spine was performed following the standard protocol without intravenous contrast. Multiplanar CT image reconstructions of the cervical spine were also generated. COMPARISON:  None FINDINGS: CT HEAD FINDINGS Brain: No evidence of acute infarction, hemorrhage, hydrocephalus, extra-axial collection or mass lesion/mass effect. Vascular: No hyperdense vessels.  Carotid vascular calcification Skull: Normal. Negative for fracture or focal lesion. Sinuses/Orbits: No acute finding. Other: Small forehead hematoma CT CERVICAL SPINE FINDINGS Alignment: Reversal of cervical lordosis. No subluxation. Facet alignment within normal limits Skull base and vertebrae: No acute fracture. No primary bone lesion or focal pathologic process. Soft tissues and spinal canal: No prevertebral fluid or swelling. No visible canal hematoma. Disc levels: Moderate-to-marked  degenerative change C5-C6 and C6-C7 with mild degenerative change C4-C5. Multiple level foraminal narrowing. Upper chest: Right apical scarring. Other: None IMPRESSION: 1. Negative non contrasted CT appearance of the brain 2. Reversal of cervical lordosis with degenerative change. No acute osseous abnormality Electronically Signed   By: Donavan Foil M.D.   On: 04/11/2019 21:11   Ct Cervical Spine Wo Contrast  Result Date: 04/11/2019 CLINICAL DATA:  Head trauma abrasion to the nose EXAM: CT HEAD WITHOUT CONTRAST CT CERVICAL SPINE WITHOUT CONTRAST TECHNIQUE: Multidetector CT imaging of the head and cervical spine was performed following the standard protocol without intravenous contrast. Multiplanar CT image reconstructions of the cervical spine were also generated. COMPARISON:  None FINDINGS: CT HEAD FINDINGS Brain: No evidence of acute infarction, hemorrhage, hydrocephalus, extra-axial collection or mass lesion/mass effect. Vascular: No hyperdense vessels.  Carotid vascular calcification Skull: Normal. Negative for fracture or focal lesion. Sinuses/Orbits: No acute finding. Other: Small forehead hematoma CT CERVICAL SPINE FINDINGS Alignment: Reversal of cervical lordosis. No subluxation. Facet alignment within normal limits Skull base and vertebrae: No acute fracture. No primary bone lesion or focal pathologic process. Soft tissues and spinal canal: No prevertebral fluid or swelling. No visible canal hematoma. Disc levels: Moderate-to-marked degenerative change C5-C6 and C6-C7 with mild degenerative change C4-C5. Multiple level foraminal narrowing. Upper chest: Right apical scarring. Other: None IMPRESSION: 1. Negative non contrasted CT appearance of the brain 2. Reversal of cervical lordosis with degenerative change. No acute osseous abnormality Electronically Signed   By: Donavan Foil M.D.   On: 04/11/2019 21:11   Dg Knee Complete 4 Views Left  Result Date: 04/11/2019 CLINICAL DATA:  Acute LEFT knee pain  following fall. Initial encounter. EXAM: LEFT KNEE - COMPLETE 4+ VIEW COMPARISON:  None. FINDINGS: Nondisplaced transverse fracture of the patella identified. A small knee effusion is present. No subluxation or dislocation. No focal bony lesions are present. IMPRESSION: Nondisplaced patellar fracture. Electronically Signed   By: Margarette Canada M.D.   On: 04/11/2019 21:09    ____________________________________________    PROCEDURES  Procedure(s) performed:    .Splint Application  Date/Time: 04/11/2019 9:48 PM Performed by: Darletta Moll, PA-C Authorized by: Darletta Moll, PA-C   Consent:    Consent obtained:  Verbal   Consent given by:  Patient   Risks discussed:  Pain  and swelling Pre-procedure details:    Sensation:  Normal Procedure details:    Laterality:  Left   Location:  Knee   Knee:  L knee   Splint type:  Knee immobilizer   Supplies:  Prefabricated splint Post-procedure details:    Pain:  Improved   Sensation:  Normal   Patient tolerance of procedure:  Tolerated well, no immediate complications      Medications  acetaminophen (TYLENOL) tablet 650 mg (has no administration in time range)  ibuprofen (ADVIL) tablet 600 mg (has no administration in time range)     ____________________________________________   INITIAL IMPRESSION / ASSESSMENT AND PLAN / ED COURSE  Pertinent labs & imaging results that were available during my care of the patient were reviewed by me and considered in my medical decision making (see chart for details).  Review of the [place] CSRS was performed in accordance of the [place] prior to dispensing any controlled drugs.           Patient's diagnosis is consistent with fall resulting in facial contusion, patellar fracture.  Patient presented to the emergency department after sustaining a fall at work.  Patient did strike her head but did not lose consciousness.  Patient is currently complaining of headache, left knee pain.   Patient is neurologically intact on exam.  Imaging of the head and neck revealed no acute findings.  Patient does have a nondisplaced left patellar fracture.  Her knee is not mobilized here in the emergency department, crutches given to assist with ambulation.  Patient is to follow-up with orthopedics for management of patellar fracture.. Patient will be discharged home with prescriptions for Percocet. Patient is to follow up with orthopedics as described above as needed or otherwise directed. Patient is given ED precautions to return to the ED for any worsening or new symptoms.     ____________________________________________  FINAL CLINICAL IMPRESSION(S) / ED DIAGNOSES  Final diagnoses:  Closed nondisplaced transverse fracture of left patella, initial encounter  Fall, initial encounter  Contusion of face, initial encounter      NEW MEDICATIONS STARTED DURING THIS VISIT:  ED Discharge Orders         Ordered    oxyCODONE-acetaminophen (PERCOCET/ROXICET) 5-325 MG tablet  Every 6 hours PRN     04/11/19 2150              This chart was dictated using voice recognition software/Dragon. Despite best efforts to proofread, errors can occur which can change the meaning. Any change was purely unintentional.    Darletta Moll, PA-C 04/11/19 2151    Carrie Mew, MD 04/11/19 2337

## 2019-04-11 NOTE — ED Notes (Signed)
Knee imobilizer placed to left knee. Crutch walking instructions given. P[atient able to give return demo.

## 2019-06-11 ENCOUNTER — Emergency Department: Payer: Worker's Compensation

## 2019-06-11 ENCOUNTER — Other Ambulatory Visit: Payer: Self-pay

## 2019-06-11 ENCOUNTER — Emergency Department
Admission: EM | Admit: 2019-06-11 | Discharge: 2019-06-11 | Disposition: A | Discharge status: Home / Self Care | Payer: Worker's Compensation | Attending: Emergency Medicine | Admitting: Emergency Medicine

## 2019-06-11 ENCOUNTER — Encounter: Payer: Self-pay | Admitting: *Deleted

## 2019-06-11 DIAGNOSIS — Y9301 Activity, walking, marching and hiking: Secondary | ICD-10-CM | POA: Insufficient documentation

## 2019-06-11 DIAGNOSIS — Y929 Unspecified place or not applicable: Secondary | ICD-10-CM | POA: Insufficient documentation

## 2019-06-11 DIAGNOSIS — E039 Hypothyroidism, unspecified: Secondary | ICD-10-CM | POA: Insufficient documentation

## 2019-06-11 DIAGNOSIS — Z7982 Long term (current) use of aspirin: Secondary | ICD-10-CM | POA: Insufficient documentation

## 2019-06-11 DIAGNOSIS — S52514A Nondisplaced fracture of right radial styloid process, initial encounter for closed fracture: Secondary | ICD-10-CM

## 2019-06-11 DIAGNOSIS — Z87891 Personal history of nicotine dependence: Secondary | ICD-10-CM | POA: Insufficient documentation

## 2019-06-11 DIAGNOSIS — S0012XA Contusion of left eyelid and periocular area, initial encounter: Secondary | ICD-10-CM | POA: Insufficient documentation

## 2019-06-11 DIAGNOSIS — Y990 Civilian activity done for income or pay: Secondary | ICD-10-CM | POA: Diagnosis not present

## 2019-06-11 DIAGNOSIS — S01511A Laceration without foreign body of lip, initial encounter: Secondary | ICD-10-CM | POA: Diagnosis not present

## 2019-06-11 DIAGNOSIS — W101XXA Fall (on)(from) sidewalk curb, initial encounter: Secondary | ICD-10-CM | POA: Insufficient documentation

## 2019-06-11 DIAGNOSIS — S0512XA Contusion of eyeball and orbital tissues, left eye, initial encounter: Secondary | ICD-10-CM

## 2019-06-11 DIAGNOSIS — Z79899 Other long term (current) drug therapy: Secondary | ICD-10-CM | POA: Insufficient documentation

## 2019-06-11 DIAGNOSIS — S0990XA Unspecified injury of head, initial encounter: Secondary | ICD-10-CM | POA: Diagnosis not present

## 2019-06-11 MED ORDER — NAPROXEN 500 MG PO TABS
500.0000 mg | ORAL_TABLET | Freq: Once | ORAL | Status: AC
  Administered 2019-06-11: 500 mg via ORAL
  Filled 2019-06-11: qty 1

## 2019-06-11 MED ORDER — OXYCODONE-ACETAMINOPHEN 5-325 MG PO TABS: 1.0000 | ORAL_TABLET | Freq: Four times a day (QID) | ORAL | 0 refills | Status: DC | PRN

## 2019-06-11 MED ORDER — NAPROXEN 500 MG PO TABS: 500.0000 mg | ORAL_TABLET | Freq: Two times a day (BID) | ORAL | 0 refills | Status: DC

## 2019-06-11 NOTE — ED Provider Notes (Signed)
[facility] Emergency Department Provider Note  ____________________________________________  Time seen: Approximately 11:10 PM  I have reviewed the triage vital signs and the nursing notes.   HISTORY  Chief Complaint Fall and Facial Injury   HPI [name] [name] is a 63 y.o. female who presents to the emergency department for treatment and evaluation after a mechanical, non-syncopal fall while leaving work at [employer]. She tripped on the sidewalk and fell. She struck her face and has pain in her right wrist.  She denies pain in her neck or back.  No alleviating measures attempted prior to arrival.   Past Medical History:  Diagnosis Date  . Actinic keratosis   . Acute hepatitis C without mention of hepatic coma(070.51)   . Depression   . GERD (gastroesophageal reflux disease)   . Hypothyroidism   . Insomnia   . Insomnia   . Memory loss   . Osteoporosis   . Tremor     Patient Active Problem List   Diagnosis Date Noted  . Hepatitis C 08/19/2016  . Depression 08/19/2016  . Insomnia 08/19/2016  . Hypothyroidism 08/19/2016    Past Surgical History:  Procedure Laterality Date  . ANKLE SURGERY Right   . BLADDER TACT    . HAND SURGERY Right   . TONSILLECTOMY      Prior to Admission medications   Medication Sig Start Date End Date Taking? Authorizing Provider  amitriptyline (ELAVIL) 50 MG tablet Take 150 mg by mouth at bedtime as needed for sleep.     [provider]  aspirin EC 81 MG tablet Take 81 mg by mouth daily.    [provider]  buPROPion (WELLBUTRIN SR) 150 MG 12 hr tablet Take 150 mg by mouth 3 (three) times daily.     [provider]  calcium carbonate (OS-CAL) 600 MG TABS tablet Take 600 mg by mouth 2 (two) times daily with a meal.    [provider]  Cholecalciferol (HM VITAMIN D3) 4000 units CAPS Take 4,000 Units by mouth daily.    [provider]  docusate sodium (COLACE) 100 MG capsule  Take 100 mg by mouth 3 (three) times daily. 1 by mouth TID    [provider]  levothyroxine (SYNTHROID, LEVOTHROID) 50 MCG tablet Take 50 mcg by mouth daily before breakfast.    [provider]  lurasidone (LATUDA) 40 MG TABS tablet Take 40 mg by mouth daily at 12 noon.    [provider]  Multiple Vitamins-Minerals (MULTIVITAL-M) TABS Take 1 tablet by mouth daily.     [provider]  naproxen (NAPROSYN) 500 MG tablet Take 1 tablet (500 mg total) by mouth 2 (two) times daily with a meal. 06/11/19   Salia Cangemi B, FNP  omeprazole (PRILOSEC) 20 MG capsule Take 20 mg by mouth daily.    [provider]  oxyCODONE-acetaminophen (PERCOCET/ROXICET) 5-325 MG tablet Take 1 tablet by mouth every 6 (six) hours as needed for severe pain. 06/11/19   Janyla Biscoe, Johnette Abraham B, FNP  phentermine 37.5 MG capsule Take 37.5 mg by mouth every morning.    [provider]  tretinoin (RETIN-A) 0.05 % cream Apply 0.05 % topically at bedtime.    [provider]  venlafaxine (EFFEXOR) 75 MG tablet Take 75 mg by mouth. 2 tabs in AM and 2 tabs in afternoon and 1 tab at qhs    [provider]    Allergies Sonata [zaleplon]  Family History  Problem Relation Age of  Onset  . Dementia Mother   . Neuropathy Neg Hx   . Tremor Neg Hx     Social History Social History   Tobacco Use  . Smoking status: Former Smoker    Packs/day: 1.00    Quit date: 07/14/2016    Years since quitting: 2.9  . Smokeless tobacco: Never Used  Substance Use Topics  . Alcohol use: No  . Drug use: No    Review of Systems  Constitutional: Negative for fever. Respiratory: Negative for cough or shortness of breath.  Musculoskeletal: Negative for myalgias Skin: Positive for lip laceration, facial abrasion. Neurological: Negative for numbness or paresthesias. ____________________________________________   PHYSICAL EXAM:  VITAL SIGNS: ED Triage Vitals  Enc Vitals Group      BP 06/11/19 2126 130/80     Pulse Rate 06/11/19 2126 98     Resp 06/11/19 2126 18     Temp 06/11/19 2126 99.3 F (37.4 C)     Temp Source 06/11/19 2126 Oral     SpO2 06/11/19 2126 96 %     Weight 06/11/19 2114 160 lb (72.6 kg)     Height 06/11/19 2114 5\' 5"  (1.651 m)     Head Circumference --      Peak Flow --      Pain Score 06/11/19 2114 10     Pain Loc --      Pain Edu? --      Excl. in [place]? --      Constitutional: Uncomfortable appearing. Eyes: Conjunctivae are clear without discharge or drainage. No hyphema noted. Nose: No epistaxis. Mouth/Throat: Airway is patent. Superficial lip laceration Neck: No stridor. Unrestricted range of motion observed. Cardiovascular: Capillary refill is <3 seconds.  Respiratory: Respirations are even and unlabored.. Musculoskeletal: Right wrist deformity with 2+radial pulse.Marland Kitchen Neurologic: Awake, alert, and oriented x 4.  Skin:  Skin overlying left lower orbital rim swollen and ecchymotic with superficial abrasion.  ____________________________________________   LABS (all labs ordered are listed, but only abnormal results are displayed)  Labs Reviewed - No data to display ____________________________________________  EKG  Not indicated. ____________________________________________  RADIOLOGY  CT of the head, cervical spine, and maxillofacial bones are all negative for acute findings.  X-ray of the right wrist shows a right styloid fracture and remote fractures at the first metacarpal. ____________________________________________   PROCEDURES  .Splint Application  Date/Time: 06/11/2019 11:21 PM Performed by: Corliss Skains, NT Authorized by: Victorino Dike, FNP   Consent:    Consent obtained:  Verbal   Consent given by:  Patient   Risks discussed:  Pain and swelling Procedure details:    Laterality:  Right   Location:  Wrist   Splint type:  Volar short arm   Supplies:  Elastic bandage, cotton padding and  Ortho-Glass Post-procedure details:    Pain:  Unchanged   Sensation:  Normal   Patient tolerance of procedure:  Tolerated well, no immediate complications   ____________________________________________   INITIAL IMPRESSION / ASSESSMENT AND PLAN / ED COURSE  [name] [name] is a 63 y.o. female presents to the emergency department after sustaining a mechanical, nonsyncopal fall at [employer] this evening.  See HPI for further details.  CT of the head, maxillofacial bones, and cervical spine are all reassuring.  X-ray of the right wrist shows a nondisplaced fracture.  Splint was applied as above.  The patient is to follow-up with orthopedics.  She is driving therefore narcotic pain medication not given here tonight in the emergency  department however she will receive a prescription to be picked up at the pharmacy in the morning.  She will be given wound care and head injury instructions as well.  She is to return to the emergency department for any symptom of concern if unable to schedule an appointment with her primary care provider or the orthopedic specialist.   Medications  naproxen (NAPROSYN) tablet 500 mg (500 mg Oral Given 06/11/19 2240)     Pertinent labs & imaging results that were available during my care of the patient were reviewed by me and considered in my medical decision making (see chart for details).  ____________________________________________   FINAL CLINICAL IMPRESSION(S) / ED DIAGNOSES  Final diagnoses:  Eye contusion, left, initial encounter  Lip laceration, initial encounter  Closed nondisplaced fracture of styloid process of right radius, initial encounter  Minor head injury, initial encounter    ED Discharge Orders         Ordered    oxyCODONE-acetaminophen (PERCOCET/ROXICET) 5-325 MG tablet  Every 6 hours PRN     06/11/19 2257    naproxen (NAPROSYN) 500 MG tablet  2 times daily with meals     06/11/19 2257           Note:  This document was  prepared using Dragon voice recognition software and may include unintentional dictation errors.   Victorino Dike, FNP 06/11/19 2324    Vanessa Clifford, MD 06/12/19 [phone]

## 2019-06-11 NOTE — ED Notes (Signed)
Pt ambulatory from triage in NAD, A&Ox4. Pain 10/10 in right arm and face

## 2019-06-11 NOTE — ED Notes (Signed)
Pt employer requests a uds for workers comp.  Consents obtained and specimen collected and released to lab. Id no [id number]

## 2019-06-11 NOTE — ED Notes (Signed)
Pt attempting to provide urine sample at this time. Beth, NT with patient for WC profile.

## 2019-06-11 NOTE — ED Triage Notes (Signed)
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  Pt has abrasions and swelling to face .  No nose bleed.  Small cut to lower lip.  No loc  No vomiting.  Pt alert  Speech clear.  No blood thinners.  Pt states WC

## 2019-06-27 ENCOUNTER — Other Ambulatory Visit (HOSPITAL_COMMUNITY): Payer: Self-pay | Admitting: Orthopaedic Surgery

## 2019-06-27 ENCOUNTER — Other Ambulatory Visit: Payer: Self-pay | Admitting: Orthopaedic Surgery

## 2019-06-27 DIAGNOSIS — S52531D Colles' fracture of right radius, subsequent encounter for closed fracture with routine healing: Secondary | ICD-10-CM

## 2019-06-28 ENCOUNTER — Ambulatory Visit: Payer: Self-pay | Attending: Orthopaedic Surgery

## 2019-07-04 ENCOUNTER — Other Ambulatory Visit: Payer: Self-pay

## 2019-07-04 ENCOUNTER — Ambulatory Visit
Admission: RE | Admit: 2019-07-04 | Discharge: 2019-07-04 | Disposition: A | Discharge status: Home / Self Care | Payer: Worker's Compensation | Source: Ambulatory Visit | Attending: Orthopaedic Surgery | Admitting: Orthopaedic Surgery

## 2019-07-04 ENCOUNTER — Ambulatory Visit: Payer: Self-pay

## 2019-07-04 DIAGNOSIS — S52531D Colles' fracture of right radius, subsequent encounter for closed fracture with routine healing: Secondary | ICD-10-CM | POA: Insufficient documentation

## 2019-12-20 ENCOUNTER — Other Ambulatory Visit: Payer: Self-pay | Admitting: Family Medicine

## 2019-12-20 DIAGNOSIS — Z1231 Encounter for screening mammogram for malignant neoplasm of breast: Secondary | ICD-10-CM

## 2020-09-02 ENCOUNTER — Other Ambulatory Visit: Payer: Self-pay

## 2020-09-02 ENCOUNTER — Ambulatory Visit
Admission: RE | Admit: 2020-09-02 | Discharge: 2020-09-02 | Disposition: A | Discharge status: Home / Self Care | Payer: Worker's Compensation | Source: Ambulatory Visit | Attending: Orthopedic Surgery | Admitting: Orthopedic Surgery

## 2020-09-02 ENCOUNTER — Inpatient Hospital Stay: Admission: RE | Admit: 2020-09-02 | Payer: Self-pay | Source: Home / Self Care | Admitting: *Deleted

## 2020-09-02 ENCOUNTER — Other Ambulatory Visit: Payer: Self-pay | Admitting: Orthopedic Surgery

## 2020-09-02 DIAGNOSIS — Z96652 Presence of left artificial knee joint: Secondary | ICD-10-CM | POA: Insufficient documentation

## 2020-09-02 DIAGNOSIS — M7989 Other specified soft tissue disorders: Secondary | ICD-10-CM | POA: Insufficient documentation

## 2020-09-02 DIAGNOSIS — R2242 Localized swelling, mass and lump, left lower limb: Secondary | ICD-10-CM | POA: Diagnosis present

## 2020-09-02 DIAGNOSIS — M79662 Pain in left lower leg: Secondary | ICD-10-CM | POA: Diagnosis present

## 2020-09-03 ENCOUNTER — Other Ambulatory Visit: Payer: Self-pay

## 2021-02-26 ENCOUNTER — Other Ambulatory Visit: Payer: Self-pay | Admitting: Family Medicine

## 2021-02-26 DIAGNOSIS — Z1231 Encounter for screening mammogram for malignant neoplasm of breast: Secondary | ICD-10-CM

## 2021-04-15 ENCOUNTER — Inpatient Hospital Stay: Admission: RE | Admit: 2021-04-15 | Payer: Self-pay | Source: Ambulatory Visit

## 2021-04-16 ENCOUNTER — Other Ambulatory Visit: Payer: Self-pay

## 2021-04-16 ENCOUNTER — Ambulatory Visit
Admission: RE | Admit: 2021-04-16 | Discharge: 2021-04-16 | Disposition: A | Discharge status: Home / Self Care | Payer: Medicare Other | Source: Ambulatory Visit | Attending: Family Medicine | Admitting: Family Medicine

## 2021-04-16 DIAGNOSIS — Z1231 Encounter for screening mammogram for malignant neoplasm of breast: Secondary | ICD-10-CM | POA: Insufficient documentation

## 2021-04-20 ENCOUNTER — Encounter: Payer: Self-pay | Admitting: Obstetrics and Gynecology

## 2021-05-14 ENCOUNTER — Other Ambulatory Visit (HOSPITAL_COMMUNITY): Payer: Self-pay | Admitting: Neurology

## 2021-05-14 ENCOUNTER — Other Ambulatory Visit: Payer: Self-pay | Admitting: Neurology

## 2021-05-14 DIAGNOSIS — R251 Tremor, unspecified: Secondary | ICD-10-CM

## 2021-05-26 ENCOUNTER — Other Ambulatory Visit: Payer: Self-pay

## 2021-05-26 ENCOUNTER — Ambulatory Visit
Admission: RE | Admit: 2021-05-26 | Discharge: 2021-05-26 | Disposition: A | Discharge status: Home / Self Care | Payer: Medicare Other | Source: Ambulatory Visit | Attending: Neurology | Admitting: Neurology

## 2021-05-26 DIAGNOSIS — R251 Tremor, unspecified: Secondary | ICD-10-CM | POA: Diagnosis not present

## 2021-05-31 ENCOUNTER — Emergency Department: Payer: Medicare Other

## 2021-05-31 ENCOUNTER — Other Ambulatory Visit: Payer: Self-pay

## 2021-05-31 DIAGNOSIS — E039 Hypothyroidism, unspecified: Secondary | ICD-10-CM | POA: Insufficient documentation

## 2021-05-31 DIAGNOSIS — R251 Tremor, unspecified: Secondary | ICD-10-CM | POA: Diagnosis not present

## 2021-05-31 DIAGNOSIS — Z7982 Long term (current) use of aspirin: Secondary | ICD-10-CM | POA: Diagnosis not present

## 2021-05-31 DIAGNOSIS — R296 Repeated falls: Secondary | ICD-10-CM | POA: Diagnosis not present

## 2021-05-31 DIAGNOSIS — Z79899 Other long term (current) drug therapy: Secondary | ICD-10-CM | POA: Diagnosis not present

## 2021-05-31 DIAGNOSIS — W010XXA Fall on same level from slipping, tripping and stumbling without subsequent striking against object, initial encounter: Secondary | ICD-10-CM | POA: Insufficient documentation

## 2021-05-31 DIAGNOSIS — Z87891 Personal history of nicotine dependence: Secondary | ICD-10-CM | POA: Diagnosis not present

## 2021-05-31 LAB — COMPREHENSIVE METABOLIC PANEL
ALT: 19 U/L (ref 0–44)
AST: 18 U/L (ref 15–41)
Albumin: 4.7 g/dL (ref 3.5–5.0)
Alkaline Phosphatase: 106 U/L (ref 38–126)
Anion gap: 6 (ref 5–15)
BUN: 10 mg/dL (ref 8–23)
CO2: 28 mmol/L (ref 22–32)
Calcium: 9.1 mg/dL (ref 8.9–10.3)
Chloride: 104 mmol/L (ref 98–111)
Creatinine, Ser: 0.9 mg/dL (ref 0.44–1.00)
GFR, Estimated: >60 mL/min (ref >60)
Glucose, Bld: 108 mg/dL — ABNORMAL HIGH (ref 70–99)
Potassium: 3.8 mmol/L (ref 3.5–5.1)
Sodium: 138 mmol/L (ref 135–145)
Total Bilirubin: 0.7 mg/dL (ref 0.3–1.2)
Total Protein: 7.8 g/dL (ref 6.5–8.1)

## 2021-05-31 LAB — CBC
HCT: 37.8 % (ref 36.0–46.0)
Hemoglobin: 12.6 g/dL (ref 12.0–15.0)
MCH: 29.4 pg (ref 26.0–34.0)
MCHC: 33.3 g/dL (ref 30.0–36.0)
MCV: 88.1 fL (ref 80.0–100.0)
Platelets: 285 10*3/uL (ref 150–400)
RBC: 4.29 MIL/uL (ref 3.87–5.11)
RDW: 15 % (ref 11.5–15.5)
WBC: 8.1 10*3/uL (ref 4.0–10.5)
nRBC: 0 % (ref 0.0–0.2)

## 2021-05-31 NOTE — ED Triage Notes (Signed)
Pt states that she has been falling a lot  and has been unsteady on her feet, states that its getting worse, pt was called multiple times earlier to be triaged and was in the bathroom for 2 hours

## 2021-06-01 ENCOUNTER — Emergency Department
Admission: EM | Admit: 2021-06-01 | Discharge: 2021-06-01 | Disposition: A | Discharge status: Home / Self Care | Payer: Medicare Other | Attending: Emergency Medicine | Admitting: Emergency Medicine

## 2021-06-01 DIAGNOSIS — W19XXXA Unspecified fall, initial encounter: Secondary | ICD-10-CM

## 2021-06-01 MED ORDER — ONDANSETRON 4 MG PO TBDP: 4.0000 mg | ORAL_TABLET | Freq: Once | ORAL | Status: DC

## 2021-06-01 MED ORDER — ONDANSETRON 4 MG PO TBDP
ORAL_TABLET | ORAL | Status: AC
  Administered 2021-06-01: 4 mg via ORAL
  Filled 2021-06-01: qty 1

## 2021-06-01 NOTE — ED Notes (Signed)
Pt able to ambulate independently, walker given and instructed on use per EDP. Pt offered placement for possible rehab but pt refused, states "I know what those places are like".

## 2021-06-01 NOTE — ED Provider Notes (Signed)
[facility] Emergency Department Provider Note  ____________________________________________  Time seen: Approximately 2:26 AM  I have reviewed the triage vital signs and the nursing notes.   HISTORY  Chief Complaint Fall   HPI [name] [name] is a 65 y.o. female with a history of depression, hepatitis C, memory loss, tremor who presents for evaluation after a fall.  Patient has had balance issues with occasional tremors and several falls since August 2020.  Recently seen by neurology 3 weeks ago for that with a negative work-up including lab work and MRI.  Neurologist concerned the patient may have Parkinson's.  Patient reports that today she feels very anxious because her tremors were worse.  She ended up losing her balance and fell.  She denies head trauma.  The reports that she fell onto her buttock.  She had her phone in her hand and called 911.  She was able to scoot she to the door to be able to open the door for the ambulance.  She was unable to stand up by herself.  She denies any pain from the fall.  She does report that her imbalance has been feeling progressively worse for several months.  No headache, no chest pain, no shortness of breath, no neck pain or back pain, no lower extremity pain, no abdominal pain, no fever or chills, no vomiting or diarrhea, no dysuria, hematuria or frequency.   Past Medical History:  Diagnosis Date   Actinic keratosis    Acute hepatitis C without mention of hepatic coma(070.51)    Depression    GERD (gastroesophageal reflux disease)    Hypothyroidism    Insomnia    Insomnia    Memory loss    Osteoporosis    Tremor     Patient Active Problem List   Diagnosis Date Noted   Hepatitis C 08/19/2016   Depression 08/19/2016   Insomnia 08/19/2016   Hypothyroidism 08/19/2016    Past Surgical History:  Procedure Laterality Date   ANKLE SURGERY Right    BLADDER TACT     HAND SURGERY Right    TONSILLECTOMY       Prior to Admission medications   Medication Sig Start Date End Date Taking? Authorizing Provider  amitriptyline (ELAVIL) 50 MG tablet Take 150 mg by mouth at bedtime as needed for sleep.     [provider]  aspirin EC 81 MG tablet Take 81 mg by mouth daily.    [provider]  buPROPion (WELLBUTRIN SR) 150 MG 12 hr tablet Take 150 mg by mouth 3 (three) times daily.     [provider]  calcium carbonate (OS-CAL) 600 MG TABS tablet Take 600 mg by mouth 2 (two) times daily with a meal.    [provider]  Cholecalciferol (HM VITAMIN D3) 4000 units CAPS Take 4,000 Units by mouth daily.    [provider]  docusate sodium (COLACE) 100 MG capsule Take 100 mg by mouth 3 (three) times daily. 1 by mouth TID    [provider]  levothyroxine (SYNTHROID, LEVOTHROID) 50 MCG tablet Take 50 mcg by mouth daily before breakfast.    [provider]  lurasidone (LATUDA) 40 MG TABS tablet Take 40 mg by mouth daily at 12 noon.    [provider]  Multiple Vitamins-Minerals (MULTIVITAL-M) TABS Take 1 tablet by mouth daily.     [provider]  naproxen (NAPROSYN) 500 MG tablet Take 1 tablet (500 mg total) by mouth 2 (two) times  daily with a meal. 06/11/19   Triplett, Cari B, FNP  omeprazole (PRILOSEC) 20 MG capsule Take 20 mg by mouth daily.    [provider]  oxyCODONE-acetaminophen (PERCOCET/ROXICET) 5-325 MG tablet Take 1 tablet by mouth every 6 (six) hours as needed for severe pain. 06/11/19   Triplett, Johnette Abraham B, FNP  phentermine 37.5 MG capsule Take 37.5 mg by mouth every morning.    [provider]  tretinoin (RETIN-A) 0.05 % cream Apply 0.05 % topically at bedtime.    [provider]  venlafaxine (EFFEXOR) 75 MG tablet Take 75 mg by mouth. 2 tabs in AM and 2 tabs in afternoon and 1 tab at qhs    [provider]    Allergies Sonata [zaleplon]  Family History  Problem Relation Age of  Onset   Dementia Mother    Breast cancer Sister 32   Neuropathy Neg Hx    Tremor Neg Hx     Social History Social History   Tobacco Use   Smoking status: Former    Packs/day: 1.00    Types: Cigarettes    Quit date: 07/14/2016    Years since quitting: 4.8   Smokeless tobacco: Never  Vaping Use   Vaping Use: Never used  Substance Use Topics   Alcohol use: No   Drug use: No    Review of Systems  Constitutional: Negative for fever. + imbalance Eyes: Negative for visual changes. ENT: Negative for facial injury or neck injury Cardiovascular: Negative for chest injury. Respiratory: Negative for shortness of breath. Negative for chest wall injury. Gastrointestinal: Negative for abdominal pain or injury. Genitourinary: Negative for dysuria. Musculoskeletal: Negative for back injury, negative for arm or leg pain. Skin: Negative for laceration/abrasions. Neurological: Negative for head injury. + tremor   ____________________________________________   PHYSICAL EXAM:  VITAL SIGNS: ED Triage Vitals  Enc Vitals Group     BP 05/31/21 1958 (!) 150/81     Pulse Rate 05/31/21 1958 88     Resp 05/31/21 1958 16     Temp 05/31/21 1958 98.4 F (36.9 C)     Temp Source 05/31/21 1958 Oral     SpO2 05/31/21 1958 96 %     Weight 05/31/21 1959 162 lb (73.5 kg)     Height 05/31/21 1959 5\' 5"  (1.651 m)     Head Circumference --      Peak Flow --      Pain Score 05/31/21 1959 0     Pain Loc --      Pain Edu? --      Excl. in [place]? --     Full spinal precautions maintained throughout the trauma exam. Constitutional: Alert and oriented. No acute distress. Does not appear intoxicated. HEENT Head: Normocephalic and atraumatic. Face: No facial bony tenderness. Stable midface Ears: No hemotympanum bilaterally. No Battle sign Eyes: No eye injury. PERRL. No raccoon eyes Nose: Nontender. No epistaxis. No rhinorrhea Mouth/Throat: Mucous membranes are moist. No oropharyngeal blood. No  dental injury. Airway patent without stridor. Normal voice. Neck: no C-collar. No midline c-spine tenderness.  Cardiovascular: Normal rate, regular rhythm. Normal and symmetric distal pulses are present in all extremities. Pulmonary/Chest: Chest wall is stable and nontender to palpation/compression. Normal respiratory effort. Breath sounds are normal. No crepitus.  Abdominal: Soft, nontender, non distended. Musculoskeletal: Nontender with normal full range of motion in all extremities. No deformities. No thoracic or lumbar midline spinal tenderness. Pelvis is stable. Skin: Skin is warm, dry and intact. No abrasions  or contutions. Psychiatric: Speech and behavior are appropriate. Neurological: Normal speech and language. Moves all extremities to command. No gross focal neurologic deficits are appreciated. Able to ambulate independently with a wide shuffling gait  Glascow Coma Score: 4 - Opens eyes on own 6 - Follows simple motor commands 5 - Alert and oriented GCS: 15   ____________________________________________   LABS (all labs ordered are listed, but only abnormal results are displayed)  Labs Reviewed  COMPREHENSIVE METABOLIC PANEL - Abnormal; Notable for the following components:      Result Value   Glucose, Bld 108 (*)    All other components within normal limits  CBC  URINALYSIS, COMPLETE (UACMP) WITH MICROSCOPIC   ____________________________________________  EKG  ED ECG REPORT I, Rudene Re, the attending physician, personally viewed and interpreted this ECG.  Sinus rhythm with a right bundle branch block, no ST elevations or depressions ____________________________________________  RADIOLOGY  I have personally reviewed the images performed during this visit and I agree with the Radiologist's read.   Interpretation by Radiologist:  CT HEAD WO CONTRAST (5MM)  Result Date: 06/01/2021 CLINICAL DATA:  Multiple recent falls EXAM: CT HEAD WITHOUT CONTRAST  TECHNIQUE: Contiguous axial images were obtained from the base of the skull through the vertex without intravenous contrast. COMPARISON:  MRI from 05/16/2021 FINDINGS: Brain: No evidence of acute infarction, hemorrhage, hydrocephalus, extra-axial collection or mass lesion/mass effect. Mild atrophic changes are noted stable from the prior exam. The known mild ischemic changes on prior MRI are not as well appreciated on today's CT. Vascular: No hyperdense vessel or unexpected calcification. Skull: Normal. Negative for fracture or focal lesion. Sinuses/Orbits: No acute finding. Other: None. IMPRESSION: Mild atrophic changes without acute abnormality. Electronically Signed   By: Inez Catalina M.D.   On: 06/01/2021 00:34     ____________________________________________   PROCEDURES  Procedure(s) performed: None Procedures Critical Care performed:  None ____________________________________________   INITIAL IMPRESSION / ASSESSMENT AND PLAN / ED COURSE  65 y.o. female with a history of depression, hepatitis C, memory loss, tremor who presents for evaluation after a fall.  Patient with a history of tremor, balance issues, and recurrent falls.  Had a fall today with no injuries.  No head trauma, no neck pain or back pain.  On exam she is neurologically intact, she does have a mild tremor of the left upper extremity and walks with a wide shuffling gait.  I did review the note from Dr. Brigitte Pulse from neurology from 3 weeks ago and exam seems unchanged.  At this time the concern is that patient may have Parkinson's disease.  She has not been started any medications yet.  Dr. Manuella Ghazi was planning to refer patient to PT but that has not happened yet.  Patient does live alone and does not have a walker.  I will provide her with a walker to help with ambulation and to prevent falls.  We discussed possible placement in an assisted living facility but patient declines.  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  Head CT here was unremarkable, I did review the MRI done 6 days ago which was also unremarkable.  Also reviewed all the blood work done by the neurologist with did not show any other etiologies for patient's symptoms.  Her blood work here is within baseline with no acute findings.  At this time since  patient does not wish to be placed in assisted facility will discharge home with a walker and recommended close follow-up with neurology for possible medical intervention for Parkinson's disease.  Discussed my standard return precautions with her.       ____________________________________________  Please note:  Patient was evaluated in Emergency Department today for the symptoms described in the history of present illness. Patient was evaluated in the context of the global COVID-19 pandemic, which necessitated consideration that the patient might be at risk for infection with the SARS-CoV-2 virus that causes COVID-19. Institutional protocols and algorithms that pertain to the evaluation of patients at risk for COVID-19 are in a state of rapid change based on information released by regulatory bodies including the CDC and federal and state organizations. These policies and algorithms were followed during the patient's care in the ED.  Some ED evaluations and interventions may be delayed as a result of limited staffing during the pandemic.   ____________________________________________   FINAL CLINICAL IMPRESSION(S) / ED DIAGNOSES   Final diagnoses:  Fall, initial encounter      NEW MEDICATIONS STARTED DURING THIS VISIT:  ED Discharge Orders     None        Note:  This document was prepared using Dragon voice recognition software and may include unintentional dictation errors.    Rudene Re, MD 06/01/21 [phone]

## 2021-06-01 NOTE — Discharge Instructions (Addendum)

## 2021-06-01 NOTE — ED Notes (Signed)
Patient discharged to home per MD order. Patient in stable condition, and deemed medically cleared by ED provider for discharge. Discharge instructions reviewed with patient/family using "Teach Back"; verbalized understanding of medication education and administration, and information about follow-up care. Denies further concerns. ° °

## 2021-10-06 ENCOUNTER — Encounter: Payer: Self-pay | Admitting: Ophthalmology

## 2021-10-11 NOTE — Discharge Instructions (Signed)

## 2021-10-13 ENCOUNTER — Ambulatory Visit
Admission: RE | Admit: 2021-10-13 | Discharge: 2021-10-13 | Disposition: A | Discharge status: Home / Self Care | Payer: Medicare Other | Attending: Ophthalmology | Admitting: Ophthalmology

## 2021-10-13 ENCOUNTER — Encounter
Admission: RE | Disposition: A | Discharge status: Home / Self Care | Payer: Self-pay | Source: Home / Self Care | Attending: Ophthalmology

## 2021-10-13 ENCOUNTER — Ambulatory Visit: Payer: Medicare Other | Admitting: Anesthesiology

## 2021-10-13 ENCOUNTER — Other Ambulatory Visit: Payer: Self-pay

## 2021-10-13 DIAGNOSIS — K219 Gastro-esophageal reflux disease without esophagitis: Secondary | ICD-10-CM | POA: Diagnosis not present

## 2021-10-13 DIAGNOSIS — Z87891 Personal history of nicotine dependence: Secondary | ICD-10-CM | POA: Diagnosis not present

## 2021-10-13 DIAGNOSIS — E039 Hypothyroidism, unspecified: Secondary | ICD-10-CM | POA: Diagnosis not present

## 2021-10-13 DIAGNOSIS — F32A Depression, unspecified: Secondary | ICD-10-CM | POA: Insufficient documentation

## 2021-10-13 DIAGNOSIS — H2512 Age-related nuclear cataract, left eye: Secondary | ICD-10-CM | POA: Insufficient documentation

## 2021-10-13 HISTORY — PX: CATARACT EXTRACTION W/PHACO: SHX586

## 2021-10-13 HISTORY — DX: Presence of dental prosthetic device (complete) (partial): Z97.2

## 2021-10-13 HISTORY — DX: Carpal tunnel syndrome, left upper limb: G56.02

## 2021-10-13 HISTORY — DX: Unspecified osteoarthritis, unspecified site: M19.90

## 2021-10-13 SURGERY — PHACOEMULSIFICATION, CATARACT, WITH IOL INSERTION
Anesthesia: Monitor Anesthesia Care | Site: Eye | Laterality: Left

## 2021-10-13 MED ORDER — SIGHTPATH DOSE#1 BSS IO SOLN
INTRAOCULAR | Status: DC | PRN
  Administered 2021-10-13: 64 mL via OPHTHALMIC

## 2021-10-13 MED ORDER — SIGHTPATH DOSE#1 BSS IO SOLN
INTRAOCULAR | Status: DC | PRN
  Administered 2021-10-13: 1 mL via INTRAMUSCULAR

## 2021-10-13 MED ORDER — MIDAZOLAM HCL 2 MG/2ML IJ SOLN
INTRAMUSCULAR | Status: DC | PRN
Start: 2021-10-13 — End: 2021-10-13
  Administered 2021-10-13 (×2): 1 mg via INTRAVENOUS

## 2021-10-13 MED ORDER — FENTANYL CITRATE (PF) 100 MCG/2ML IJ SOLN
INTRAMUSCULAR | Status: DC | PRN
  Administered 2021-10-13: 50 ug via INTRAVENOUS

## 2021-10-13 MED ORDER — CEFUROXIME OPHTHALMIC INJECTION 1 MG/0.1 ML
INJECTION | OPHTHALMIC | Status: DC | PRN
  Administered 2021-10-13: 1 mg via OPHTHALMIC

## 2021-10-13 MED ORDER — LACTATED RINGERS IV SOLN: INTRAVENOUS | Status: DC

## 2021-10-13 MED ORDER — TETRACAINE HCL 0.5 % OP SOLN
1.0000 [drp] | OPHTHALMIC | Status: DC | PRN
  Administered 2021-10-13 (×3): 1 [drp] via OPHTHALMIC

## 2021-10-13 MED ORDER — SIGHTPATH DOSE#1 NA HYALUR & NA CHOND-NA HYALUR IO KIT
PACK | INTRAOCULAR | Status: DC | PRN
  Administered 2021-10-13: 1 via OPHTHALMIC

## 2021-10-13 MED ORDER — ARMC OPHTHALMIC DILATING DROPS
1.0000 "application " | OPHTHALMIC | Status: DC | PRN
  Administered 2021-10-13 (×3): 1 via OPHTHALMIC

## 2021-10-13 MED ORDER — BRIMONIDINE TARTRATE-TIMOLOL 0.2-0.5 % OP SOLN
OPHTHALMIC | Status: DC | PRN
  Administered 2021-10-13: 1 [drp] via OPHTHALMIC

## 2021-10-13 MED ORDER — SIGHTPATH DOSE#1 BSS IO SOLN
INTRAOCULAR | Status: DC | PRN
  Administered 2021-10-13: 15 mL

## 2021-10-13 SURGICAL SUPPLY — 13 items
CANNULA ANT/CHMB 27G (MISCELLANEOUS) IMPLANT
CANNULA ANT/CHMB 27GA (MISCELLANEOUS) ×2 IMPLANT
CATARACT SUITE SIGHTPATH (MISCELLANEOUS) ×2 IMPLANT
FEE CATARACT SUITE SIGHTPATH (MISCELLANEOUS) ×1 IMPLANT
GLOVE SRG 8 PF TXTR STRL LF DI (GLOVE) ×1 IMPLANT
GLOVE SURG ENC TEXT LTX SZ7.5 (GLOVE) ×2 IMPLANT
GLOVE SURG UNDER POLY LF SZ8 (GLOVE) ×2
LENS IOL TECNIS EYHANCE 20.5 (Intraocular Lens) ×1 IMPLANT
NDL FILTER BLUNT 18X1 1/2 (NEEDLE) ×1 IMPLANT
NEEDLE FILTER BLUNT 18X 1/2SAF (NEEDLE) ×1
NEEDLE FILTER BLUNT 18X1 1/2 (NEEDLE) ×1 IMPLANT
SYR 3ML LL SCALE MARK (SYRINGE) ×2 IMPLANT
WATER STERILE IRR 250ML POUR (IV SOLUTION) ×2 IMPLANT
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
## 2021-10-13 NOTE — Anesthesia Preprocedure Evaluation (Signed)
Anesthesia Evaluation  ?Patient identified by MRN, date of birth, ID band ?Patient awake ? ? ? ?Reviewed: ?Allergy & Precautions, NPO status , Patient's Chart, lab work & pertinent test results, reviewed documented beta blocker date and time  ? ?History of Anesthesia Complications ?Negative for: history of anesthetic complications ? ?Airway ?Mallampati: III ? ?TM Distance: >3 FB ?Neck ROM: Limited ? ?Mouth opening: Limited Mouth Opening ? Dental ? ?(+) Upper Dentures, Partial Lower ?  ?Pulmonary ?former smoker,  ?  ?breath sounds clear to auscultation ? ? ? ? ? ? Cardiovascular ?(-) angina(-) DOE + dysrhythmias (RBBB)  ?Rhythm:Regular Rate:Normal ? ? ?  ?Neuro/Psych ?PSYCHIATRIC DISORDERS Depression  ?Tremor ? Neuromuscular disease (Carpal tunnel)   ? GI/Hepatic ?GERD  ,(+) Hepatitis -, C  ?Endo/Other  ?Hypothyroidism  ? Renal/GU ?  ? ?  ?Musculoskeletal ? ?(+) Arthritis ,  ? Abdominal ?  ?Peds ? Hematology ?  ?Anesthesia Other Findings ? ? Reproductive/Obstetrics ? ?  ? ? ? ? ? ? ? ? ? ? ? ? ? ?  ?  ? ? ? ? ? ? ? ? ?Anesthesia Physical ?Anesthesia Plan ? ?ASA: 3 ? ?Anesthesia Plan: MAC  ? ?Post-op Pain Management:   ? ?Induction: Intravenous ? ?PONV Risk Score and Plan: 2 and TIVA, Midazolam and Treatment may vary due to age or medical condition ? ?Airway Management Planned: Nasal Cannula ? ?Additional Equipment:  ? ?Intra-op Plan:  ? ?Post-operative Plan:  ? ?Informed Consent: I have reviewed the patients History and Physical, chart, labs and discussed the procedure including the risks, benefits and alternatives for the proposed anesthesia with the patient or authorized representative who has indicated his/her understanding and acceptance.  ? ? ? ? ? ?Plan Discussed with: CRNA and Anesthesiologist ? ?Anesthesia Plan Comments:   ? ? ? ? ? ? ?Anesthesia Quick Evaluation ? ?

## 2021-10-13 NOTE — Anesthesia Postprocedure Evaluation (Signed)
Anesthesia Post Note ? ?Patient: [name] [name] ? ?Procedure(s) Performed: CATARACT EXTRACTION PHACO AND INTRAOCULAR LENS PLACEMENT (IOC) LEFT (Left: Eye) ? ? ?  ?Patient location during evaluation: PACU ?Anesthesia Type: MAC ?Level of consciousness: awake and alert ?Pain management: pain level controlled ?Vital Signs Assessment: post-procedure vital signs reviewed and stable ?Respiratory status: spontaneous breathing, nonlabored ventilation, respiratory function stable and patient connected to nasal cannula oxygen ?Cardiovascular status: stable and blood pressure returned to baseline ?Postop Assessment: no apparent nausea or vomiting ?Anesthetic complications: no ? ? ?No notable events documented. ? ?Clatie Kessen A  Khilynn Borntreger ? ? ? ? ? ?

## 2021-10-13 NOTE — Transfer of Care (Signed)
Immediate Anesthesia Transfer of Care Note ? ?Patient: [name] [name] ? ?Procedure(s) Performed: CATARACT EXTRACTION PHACO AND INTRAOCULAR LENS PLACEMENT (IOC) LEFT (Left: Eye) ? ?Patient Location: PACU ? ?Anesthesia Type: MAC ? ?Level of Consciousness: awake, alert  and patient cooperative ? ?Airway and Oxygen Therapy: Patient Spontanous Breathing and Patient connected to supplemental oxygen ? ?Post-op Assessment: Post-op Vital signs reviewed, Patient's Cardiovascular Status Stable, Respiratory Function Stable, Patent Airway and No signs of Nausea or vomiting ? ?Post-op Vital Signs: Reviewed and stable ? ?Complications: No notable events documented. ? ?

## 2021-10-13 NOTE — Op Note (Signed)
OPERATIVE NOTE ? ?[name] [name] ?[id number] ?10/13/2021 ? ? ?PREOPERATIVE DIAGNOSIS:  Nuclear sclerotic cataract left eye. H25.12 ?  ?POSTOPERATIVE DIAGNOSIS:    Nuclear sclerotic cataract left eye.   ?  ?PROCEDURE:  Phacoemusification with posterior chamber intraocular lens placement of the left eye  ?Ultrasound time: Procedure(s) with comments: ?CATARACT EXTRACTION PHACO AND INTRAOCULAR LENS PLACEMENT (IOC) LEFT (Left) - 3.97 ?00:49.0 ? ?LENS:   ?Implant Name Type Inv. Item Serial No. Manufacturer Lot No. LRB No. Used Action  ?LENS IOL TECNIS EYHANCE 20.5 - [id number] Intraocular Lens LENS IOL TECNIS EYHANCE 20.5 [id number] [employer]  Left 1 Implanted  ?   ? ?SURGEON:  Wyonia Hough, MD ?  ?ANESTHESIA:  Topical with tetracaine drops and 2% Xylocaine jelly, augmented with 1% preservative-free intracameral lidocaine. ? ?  ?COMPLICATIONS:  None. ?  ?DESCRIPTION OF PROCEDURE:  The patient was identified in the holding room and transported to the operating room and placed in the supine position under the operating microscope.  The left eye was identified as the operative eye and it was prepped and draped in the usual sterile ophthalmic fashion. ?  ?A 1 millimeter clear-corneal paracentesis was made at the 1:30 position.  0.5 ml of preservative-free 1% lidocaine was injected into the anterior chamber. ? The anterior chamber was filled with Viscoat viscoelastic.  A 2.4 millimeter keratome was used to make a near-clear corneal incision at the 10:30 position.  .  A curvilinear capsulorrhexis was made with a cystotome and capsulorrhexis forceps.  Balanced salt solution was used to hydrodissect and hydrodelineate the nucleus. ?  ?Phacoemulsification was then used in stop and chop fashion to remove the lens nucleus and epinucleus.  The remaining cortex was then removed using the irrigation and aspiration handpiece. Provisc was then placed into the capsular bag to distend it for lens placement.  A lens was then injected  into the capsular bag.  The remaining viscoelastic was aspirated. ?  ?Wounds were hydrated with balanced salt solution.  The anterior chamber was inflated to a physiologic pressure with balanced salt solution.  No wound leaks were noted. Cefuroxime 0.1 ml of a 10mg /ml solution was injected into the anterior chamber for a dose of 1 mg of intracameral antibiotic at the completion of the case. ?  Timolol and Brimonidine drops were applied to the eye.  The patient was taken to the recovery room in stable condition without complications of anesthesia or surgery. ? ?[name] ?10/13/2021, 12:19 PM ? ?

## 2021-10-14 ENCOUNTER — Encounter: Payer: Self-pay | Admitting: Ophthalmology

## 2021-10-21 ENCOUNTER — Encounter: Payer: Self-pay | Admitting: Ophthalmology

## 2021-10-25 NOTE — Discharge Instructions (Signed)

## 2021-10-27 ENCOUNTER — Ambulatory Visit: Payer: Medicare Other | Admitting: Anesthesiology

## 2021-10-27 ENCOUNTER — Other Ambulatory Visit: Payer: Self-pay

## 2021-10-27 ENCOUNTER — Encounter: Payer: Self-pay | Admitting: Ophthalmology

## 2021-10-27 ENCOUNTER — Ambulatory Visit
Admission: RE | Admit: 2021-10-27 | Discharge: 2021-10-27 | Disposition: A | Discharge status: Home / Self Care | Payer: Medicare Other | Attending: Ophthalmology | Admitting: Ophthalmology

## 2021-10-27 ENCOUNTER — Encounter
Admission: RE | Disposition: A | Discharge status: Home / Self Care | Payer: Self-pay | Source: Home / Self Care | Attending: Ophthalmology

## 2021-10-27 DIAGNOSIS — Z87891 Personal history of nicotine dependence: Secondary | ICD-10-CM | POA: Diagnosis not present

## 2021-10-27 DIAGNOSIS — F32A Depression, unspecified: Secondary | ICD-10-CM | POA: Insufficient documentation

## 2021-10-27 DIAGNOSIS — G5600 Carpal tunnel syndrome, unspecified upper limb: Secondary | ICD-10-CM | POA: Insufficient documentation

## 2021-10-27 DIAGNOSIS — K739 Chronic hepatitis, unspecified: Secondary | ICD-10-CM | POA: Insufficient documentation

## 2021-10-27 DIAGNOSIS — H2511 Age-related nuclear cataract, right eye: Secondary | ICD-10-CM | POA: Diagnosis not present

## 2021-10-27 DIAGNOSIS — E039 Hypothyroidism, unspecified: Secondary | ICD-10-CM | POA: Diagnosis not present

## 2021-10-27 DIAGNOSIS — K219 Gastro-esophageal reflux disease without esophagitis: Secondary | ICD-10-CM | POA: Diagnosis not present

## 2021-10-27 DIAGNOSIS — R251 Tremor, unspecified: Secondary | ICD-10-CM | POA: Insufficient documentation

## 2021-10-27 HISTORY — PX: CATARACT EXTRACTION W/PHACO: SHX586

## 2021-10-27 SURGERY — PHACOEMULSIFICATION, CATARACT, WITH IOL INSERTION
Anesthesia: Monitor Anesthesia Care | Site: Eye | Laterality: Right

## 2021-10-27 MED ORDER — BRIMONIDINE TARTRATE-TIMOLOL 0.2-0.5 % OP SOLN
OPHTHALMIC | Status: DC | PRN
  Administered 2021-10-27: 1 [drp] via OPHTHALMIC

## 2021-10-27 MED ORDER — ACETAMINOPHEN 500 MG PO TABS: 1000.0000 mg | ORAL_TABLET | Freq: Once | ORAL | Status: DC | PRN

## 2021-10-27 MED ORDER — LACTATED RINGERS IV SOLN: INTRAVENOUS | Status: DC

## 2021-10-27 MED ORDER — ACETAMINOPHEN 160 MG/5ML PO SOLN: 975.0000 mg | Freq: Once | ORAL | Status: DC | PRN

## 2021-10-27 MED ORDER — EPINEPHRINE PF 1 MG/ML IJ SOLN
INTRAMUSCULAR | Status: DC | PRN
  Administered 2021-10-27: 88 mL via OPHTHALMIC

## 2021-10-27 MED ORDER — MIDAZOLAM HCL 2 MG/2ML IJ SOLN
INTRAMUSCULAR | Status: DC | PRN
Start: 2021-10-27 — End: 2021-10-27
  Administered 2021-10-27: 2 mg via INTRAVENOUS

## 2021-10-27 MED ORDER — ONDANSETRON HCL 4 MG/2ML IJ SOLN: 4.0000 mg | Freq: Once | INTRAMUSCULAR | Status: DC | PRN

## 2021-10-27 MED ORDER — ARMC OPHTHALMIC DILATING DROPS
1.0000 | OPHTHALMIC | Status: DC | PRN
Start: 2021-10-27 — End: 2021-10-27
  Administered 2021-10-27 (×2): 1 via OPHTHALMIC

## 2021-10-27 MED ORDER — SIGHTPATH DOSE#1 BSS IO SOLN
INTRAOCULAR | Status: DC | PRN
  Administered 2021-10-27: 15 mL via INTRAOCULAR

## 2021-10-27 MED ORDER — TETRACAINE HCL 0.5 % OP SOLN
1.0000 [drp] | OPHTHALMIC | Status: DC | PRN
  Administered 2021-10-27 (×4): 1 [drp] via OPHTHALMIC

## 2021-10-27 MED ORDER — FENTANYL CITRATE (PF) 100 MCG/2ML IJ SOLN
INTRAMUSCULAR | Status: DC | PRN
  Administered 2021-10-27: 50 ug via INTRAVENOUS

## 2021-10-27 MED ORDER — SIGHTPATH DOSE#1 BSS IO SOLN
INTRAOCULAR | Status: DC | PRN
  Administered 2021-10-27: 2 mL

## 2021-10-27 MED ORDER — CEFUROXIME OPHTHALMIC INJECTION 1 MG/0.1 ML
INJECTION | OPHTHALMIC | Status: DC | PRN
Start: 2021-10-27 — End: 2021-10-27
  Administered 2021-10-27: 0.1 mL via INTRACAMERAL

## 2021-10-27 MED ORDER — SIGHTPATH DOSE#1 NA HYALUR & NA CHOND-NA HYALUR IO KIT
PACK | INTRAOCULAR | Status: DC | PRN
  Administered 2021-10-27: 1 via OPHTHALMIC

## 2021-10-27 SURGICAL SUPPLY — 13 items
CANNULA ANT/CHMB 27G (MISCELLANEOUS) IMPLANT
CANNULA ANT/CHMB 27GA (MISCELLANEOUS) IMPLANT
CATARACT SUITE SIGHTPATH (MISCELLANEOUS) ×2 IMPLANT
FEE CATARACT SUITE SIGHTPATH (MISCELLANEOUS) ×1 IMPLANT
GLOVE SRG 8 PF TXTR STRL LF DI (GLOVE) ×1 IMPLANT
GLOVE SURG ENC TEXT LTX SZ7.5 (GLOVE) ×2 IMPLANT
GLOVE SURG UNDER POLY LF SZ8 (GLOVE) ×2
LENS IOL TECNIS EYHANCE 21.5 (Intraocular Lens) ×1 IMPLANT
NDL FILTER BLUNT 18X1 1/2 (NEEDLE) ×1 IMPLANT
NEEDLE FILTER BLUNT 18X 1/2SAF (NEEDLE) ×1
NEEDLE FILTER BLUNT 18X1 1/2 (NEEDLE) ×1 IMPLANT
SYR 3ML LL SCALE MARK (SYRINGE) ×2 IMPLANT
WATER STERILE IRR 250ML POUR (IV SOLUTION) ×2 IMPLANT

## 2021-10-27 NOTE — Anesthesia Postprocedure Evaluation (Signed)
Anesthesia Post Note ? ?Patient: [name] [name] ? ?Procedure(s) Performed: CATARACT EXTRACTION PHACO AND INTRAOCULAR LENS PLACEMENT (IOC) RIGHT 4.50 00:58.9 (Right: Eye) ? ? ?  ?Patient location during evaluation: PACU ?Anesthesia Type: MAC ?Level of consciousness: awake and alert ?Pain management: pain level controlled ?Vital Signs Assessment: post-procedure vital signs reviewed and stable ?Respiratory status: spontaneous breathing, nonlabored ventilation and respiratory function stable ?Cardiovascular status: stable and blood pressure returned to baseline ?Postop Assessment: no apparent nausea or vomiting ?Anesthetic complications: no ? ? ?No notable events documented. ? ?April Manson ? ? ? ? ? ?

## 2021-10-27 NOTE — Anesthesia Preprocedure Evaluation (Signed)
Anesthesia Evaluation  ?Patient identified by MRN, date of birth, ID band ?Patient awake ? ? ? ?Reviewed: ?Allergy & Precautions, NPO status , Patient's Chart, lab work & pertinent test results, reviewed documented beta blocker date and time  ? ?History of Anesthesia Complications ?Negative for: history of anesthetic complications ? ?Airway ?Mallampati: III ? ?TM Distance: >3 FB ?Neck ROM: Limited ? ?Mouth opening: Limited Mouth Opening ? Dental ? ?(+) Upper Dentures, Partial Lower ?  ?Pulmonary ?former smoker,  ?  ?breath sounds clear to auscultation ? ? ? ? ? ? Cardiovascular ?(-) angina(-) DOE + dysrhythmias (RBBB)  ?Rhythm:Regular Rate:Normal ? ? ?  ?Neuro/Psych ?PSYCHIATRIC DISORDERS Depression  ?Tremor ? Neuromuscular disease (Carpal tunnel)   ? GI/Hepatic ?GERD  ,(+) Hepatitis -, C  ?Endo/Other  ?Hypothyroidism  ? Renal/GU ?  ? ?  ?Musculoskeletal ? ?(+) Arthritis ,  ? Abdominal ?  ?Peds ? Hematology ?  ?Anesthesia Other Findings ? ? Reproductive/Obstetrics ? ?  ? ? ? ? ? ? ? ? ? ? ? ? ? ?  ?  ? ? ? ? ? ? ? ? ?Anesthesia Physical ?Anesthesia Plan ? ?ASA: 3 ? ?Anesthesia Plan: MAC  ? ?Post-op Pain Management:   ? ?Induction: Intravenous ? ?PONV Risk Score and Plan: 2 and TIVA, Midazolam and Treatment may vary due to age or medical condition ? ?Airway Management Planned: Nasal Cannula ? ?Additional Equipment:  ? ?Intra-op Plan:  ? ?Post-operative Plan:  ? ?Informed Consent: I have reviewed the patients History and Physical, chart, labs and discussed the procedure including the risks, benefits and alternatives for the proposed anesthesia with the patient or authorized representative who has indicated his/her understanding and acceptance.  ? ? ? ? ? ?Plan Discussed with: CRNA and Anesthesiologist ? ?Anesthesia Plan Comments:   ? ? ? ? ? ? ?Anesthesia Quick Evaluation ? ?

## 2021-10-27 NOTE — Op Note (Signed)
LOCATION:  [place] ?  ?PREOPERATIVE DIAGNOSIS:    Nuclear sclerotic cataract right eye. H25.11 ?  ?POSTOPERATIVE DIAGNOSIS:  Nuclear sclerotic cataract right eye.   ?  ?PROCEDURE:  Phacoemusification with posterior chamber intraocular lens placement of the right eye  ? ?ULTRASOUND TIME: Procedure(s): ?CATARACT EXTRACTION PHACO AND INTRAOCULAR LENS PLACEMENT (IOC) RIGHT 4.50 00:58.9 (Right) ? ?LENS:   ?Implant Name Type Inv. Item Serial No. Manufacturer Lot No. LRB No. Used Action  ?LENS IOL TECNIS EYHANCE 21.5 - [id number] Intraocular Lens LENS IOL TECNIS EYHANCE 21.5 [id number] [employer]  Right 1 Implanted  ?   ?  ?  ?SURGEON:  Wyonia Hough, MD ?  ?ANESTHESIA:  Topical with tetracaine drops and 2% Xylocaine jelly, augmented with 1% preservative-free intracameral lidocaine. ? ?  ?COMPLICATIONS:  None. ?  ?DESCRIPTION OF PROCEDURE:  The patient was identified in the holding room and transported to the operating room and placed in the supine position under the operating microscope.  The right eye was identified as the operative eye and it was prepped and draped in the usual sterile ophthalmic fashion. ?  ?A 1 millimeter clear-corneal paracentesis was made at the 12:00 position.  0.5 ml of preservative-free 1% lidocaine was injected into the anterior chamber. ?The anterior chamber was filled with Viscoat viscoelastic.  A 2.4 millimeter keratome was used to make a near-clear corneal incision at the 9:00 position.  A curvilinear capsulorrhexis was made with a cystotome and capsulorrhexis forceps.  Balanced salt solution was used to hydrodissect and hydrodelineate the nucleus. ?  ?Phacoemulsification was then used in stop and chop fashion to remove the lens nucleus and epinucleus.  The remaining cortex was then removed using the irrigation and aspiration handpiece. Provisc was then placed into the capsular bag to distend it for lens placement.  A lens was then injected into the capsular bag.  The  remaining viscoelastic was aspirated. ?  ?Wounds were hydrated with balanced salt solution.  The anterior chamber was inflated to a physiologic pressure with balanced salt solution.  No wound leaks were noted.   Timolol and Brimonidine drops were applied to the eye.  The patient was taken to the recovery room in stable condition without complications of anesthesia or surgery. ? ? ?[name] [name] ?10/27/2021, 9:41 AM ? ?
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
## 2021-10-27 NOTE — Transfer of Care (Signed)
Immediate Anesthesia Transfer of Care Note ? ?Patient: [name] [name] ? ?Procedure(s) Performed: CATARACT EXTRACTION PHACO AND INTRAOCULAR LENS PLACEMENT (IOC) RIGHT 4.50 00:58.9 (Right: Eye) ? ?Patient Location: PACU ? ?Anesthesia Type: MAC ? ?Level of Consciousness: awake, alert  and patient cooperative ? ?Airway and Oxygen Therapy: Patient Spontanous Breathing and Patient connected to supplemental oxygen ? ?Post-op Assessment: Post-op Vital signs reviewed, Patient's Cardiovascular Status Stable, Respiratory Function Stable, Patent Airway and No signs of Nausea or vomiting ? ?Post-op Vital Signs: Reviewed and stable ? ?Complications: No notable events documented. ? ?

## 2021-10-28 ENCOUNTER — Encounter: Payer: Self-pay | Admitting: Ophthalmology

## 2021-11-30 ENCOUNTER — Ambulatory Visit: Payer: Medicare Other

## 2021-12-07 ENCOUNTER — Ambulatory Visit: Payer: Medicare Other | Attending: Neurology | Admitting: Physical Therapy

## 2021-12-07 ENCOUNTER — Encounter: Payer: Self-pay | Admitting: Physical Therapy

## 2021-12-07 DIAGNOSIS — R2689 Other abnormalities of gait and mobility: Secondary | ICD-10-CM | POA: Insufficient documentation

## 2021-12-07 DIAGNOSIS — R2681 Unsteadiness on feet: Secondary | ICD-10-CM | POA: Diagnosis present

## 2021-12-08 NOTE — Therapy (Signed)
[facility] [facility] ?[place], [place], [address] ?Phone: [phone]   Fax:  [phone] ? ?Physical Therapy Evaluation ? ?Patient Details  ?Name: [name] [name] ?MRN: [id number] ?Date of Birth: June 27, 1956 ?Referring Provider (PT): Dr. Manuella Ghazi ? ? ?Encounter Date: 12/07/2021 ? ? PT End of Session - 12/08/21 1344   ? ? Visit Number 1   ? Number of Visits 17   ? Date for PT Re-Evaluation 02/02/22   ? Authorization Type Medicare   ? Authorization Time Period start of care 12/07/21   ? PT Start Time 1146   ? PT Stop Time 1228   ? PT Time Calculation (min) 42 min   ? Equipment Utilized During Treatment Gait belt   ? Activity Tolerance Patient tolerated treatment well;No increased pain   ? Behavior During Therapy [facility] for tasks assessed/performed   ? ?  ?  ? ?  ? ? ?Past Medical History:  ?Diagnosis Date  ? Actinic keratosis   ? Acute hepatitis C without mention of hepatic coma(070.51)   ? 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 Carpal tunnel syndrome of left wrist   ? Depression   ? GERD (gastroesophageal reflux disease)   ? Hypothyroidism   ? Insomnia   ? Insomnia   ? Memory loss   ? Osteoporosis   ? Tremor   ? Wears dentures   ? full upper, partial lower  ? ? ?Past Surgical History:  ?Procedure Laterality Date  ? ANKLE SURGERY Right   ? BLADDER TACT    ? CATARACT EXTRACTION W/PHACO Left 10/13/2021  ? Procedure: CATARACT EXTRACTION PHACO AND INTRAOCULAR LENS PLACEMENT ([place]) LEFT;  Surgeon: Leandrew Koyanagi, MD;  Location: [place];  Service: Ophthalmology;  Laterality: Left;  3.97 ?00:49.0  ? CATARACT EXTRACTION W/PHACO Right 10/27/2021  ? Procedure: CATARACT EXTRACTION PHACO AND INTRAOCULAR LENS PLACEMENT ([place]) RIGHT 4.50 00:58.9;  Surgeon: Leandrew Koyanagi, MD;  Location: [place];  Service: Ophthalmology;  Laterality: Right;  ? HAND SURGERY Right   ? TONSILLECTOMY    ? ? ?There were no vitals filed for this visit. ? ? ? Subjective Assessment -  12/08/21 2123   ? ? Subjective "I have been falling a lot and I am really unsteady."   ? 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 She reports having trouble with balance and tremors. She reports falling often. She presents to therapy without AD. She has 3 walkers (apartment is too tiny and so she furniture walks). 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 Neurologist did prescribe Carvidopa/levadopa, did have side effects of nausea/vomitting. She has stopped taking medication,  but is hoping to see Neurologist again to see if she can get another medication to help control side effects. 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 Limitations Standing;Walking   ? How long can you sit comfortably? NA   ? How long can you stand comfortably? 10 min- legs start to ache   ? How long can you walk comfortably? about 500 feet, occasionally LE pain   ? Diagnostic tests MRI/CT scan negative for Parkinson's disease;   ?  Patient Stated Goals "Stop falling, be able to walk farther, improve balance."   ? Currently in Pain? No/denies   ? Multiple Pain Sites No   ? ?  ?  ? ?  ? ? ? ? ? [facility] PT Assessment - 12/08/21 0001   ? ?  ? Assessment  ? Medical Diagnosis Imbalance/Parkinsonionism   ? Referring Provider (PT) Dr. Manuella Ghazi   ? Onset Date/Surgical Date --   several months ago  ? Hand Dominance Right   ? Next MD Visit 12/13/21   ? Prior Therapy has had PT for other issues, but none for this condition   ?  ? Precautions  ? Precautions Fall   ? Required Braces or Orthoses --   none  ?  ? Restrictions  ? Weight Bearing Restrictions No    ?  ? Balance Screen  ? Has the patient fallen in the past 6 months Yes   ? How many times? 4+   ? Has the patient had a decrease in activity level because of a fear of falling?  Yes   ? Is the patient reluctant to leave their home because of a fear of falling?  No   ?  ? Home Environment  ? Living Environment Private residence   ? Living Arrangements Alone   ? Available Help at Discharge Friend(s)   ? Type of Home Apartment   ? Home Access Stairs to enter   ? Entrance Stairs-Number of Steps 3-5   ? Entrance Stairs-Rails Right;Left;Can reach both   able to negotiate reciprocally  ? Home Layout One level   ? [place] - 2 wheels;Bedside commode;Grab bars - toilet;Grab bars - tub/shower   ? Additional Comments has tub/shower   ?  ? Prior Function  ? Level of Independence Independent with basic ADLs   ? Vocation Retired   ? Leisure garden, play with grandkids (3yo, 2yo,and [address] the [address])   ?  ? Cognition  ? Overall Cognitive Status Within Functional Limits for tasks assessed   ? Memory --   does report difficulty with memory/forgets where she puts things  ?  ? Observation/Other Assessments  ? Focus on Therapeutic Outcomes (FOTO)  57%   ?  ? Sensation  ? Light Touch Appears Intact   ?  ? Coordination  ? Gross Motor Movements are Fluid and Coordinated Yes   ? Fine Motor Movements are Fluid and Coordinated Yes   ? Finger Nose Finger Test accurate bilaterally   ? Heel Shin Test accurate bilaterally   ?  ? Posture/Postural Control  ? Posture Comments WFL   ?  ? AROM  ? Overall AROM Comments BUE and BLE are WFL   ?  ? Strength  ? Overall Strength Comments BUE and BLE are WFL   ?  ? Transfers  ? Comments able to transfer without pushing on arm rests but does require wide base of support and reports increased difficulty   ?  ? Standardized Balance Assessment  ? Standardized Balance Assessment Five Times Sit to Stand;10 meter walk test   ? Five times sit to stand comments  13.76 sec with arms across chest (>15 sec  indicates increased risk for falls)   ? 10 Meter Walk 1.13 m/s without AD, home ambulator   ?  ? High Level Balance  ? High Level Balance Comments SLS: R: 5 sec, L: <1 sec, tandem stance: 10 sec with intermittent rail assist; no imbalance standing with eyes  closed   ?  ? Functional Gait  Assessment  ? Gait assessed  Yes   ? Gait Level Surface Walks 20 ft in less than 5.5 sec, no assistive devices, good speed, no evidence for imbalance, normal gait pattern, deviates no more than 6 in outside of the 12 in walkway width.   ? Change in Gait Speed Able to smoothly change walking speed without loss of balance or gait deviation. Deviate no more than 6 in outside of the 12 in walkway width.   ? Gait with Horizontal Head Turns Performs head turns smoothly with slight change in gait velocity (eg, minor disruption to smooth gait path), deviates 6-10 in outside 12 in walkway width, or uses an assistive device.   ? Gait with Vertical Head Turns Performs task with slight change in gait velocity (eg, minor disruption to smooth gait path), deviates 6 - 10 in outside 12 in walkway width or uses assistive device   ? Gait and Pivot Turn Pivot turns safely within 3 sec and stops quickly with no loss of balance.   ? Step Over Obstacle Is able to step over one shoe box (4.5 in total height) without changing gait speed. No evidence of imbalance.   ? Gait with Narrow Base of Support Ambulates less than 4 steps heel to toe or cannot perform without assistance.   ? Gait with Eyes Closed Walks 20 ft, slow speed, abnormal gait pattern, evidence for imbalance, deviates 10-15 in outside 12 in walkway width. Requires more than 9 sec to ambulate 20 ft.   ? Ambulating Backwards Walks 20 ft, uses assistive device, slower speed, mild gait deviations, deviates 6-10 in outside 12 in walkway width.   ? Steps Alternating feet, must use rail.   ? Total Score 20   ? FGA comment: <22/30 indicates increased risk for falls   ? ?  ?  ? ?   ? ? ? ? ? ? ? ? ? ? ? ? ? ?Objective measurements completed on examination: See above findings.  ? ? ? ? ? ? ? ? ? ? ? ? ? ? PT Education - 12/08/21 1344   ? ? Education Details plan of care   ? Person(s) Educated Patient   ? Me

## 2021-12-09 ENCOUNTER — Ambulatory Visit: Payer: Medicare Other

## 2021-12-13 ENCOUNTER — Other Ambulatory Visit: Payer: Self-pay

## 2021-12-13 ENCOUNTER — Emergency Department
Admission: EM | Admit: 2021-12-13 | Discharge: 2021-12-13 | Disposition: A | Discharge status: Home / Self Care | Payer: Medicare Other | Attending: Emergency Medicine | Admitting: Emergency Medicine

## 2021-12-13 DIAGNOSIS — R110 Nausea: Secondary | ICD-10-CM | POA: Diagnosis not present

## 2021-12-13 LAB — COMPREHENSIVE METABOLIC PANEL
ALT: <5 U/L (ref 0–44)
AST: 17 U/L (ref 15–41)
Albumin: 4.6 g/dL (ref 3.5–5.0)
Alkaline Phosphatase: 77 U/L (ref 38–126)
Anion gap: 9 (ref 5–15)
BUN: 11 mg/dL (ref 8–23)
CO2: 25 mmol/L (ref 22–32)
Calcium: 9.2 mg/dL (ref 8.9–10.3)
Chloride: 103 mmol/L (ref 98–111)
Creatinine, Ser: 1.02 mg/dL — ABNORMAL HIGH (ref 0.44–1.00)
GFR, Estimated: >60 mL/min (ref >60)
Glucose, Bld: 123 mg/dL — ABNORMAL HIGH (ref 70–99)
Potassium: 3.9 mmol/L (ref 3.5–5.1)
Sodium: 137 mmol/L (ref 135–145)
Total Bilirubin: 0.7 mg/dL (ref 0.3–1.2)
Total Protein: 7.7 g/dL (ref 6.5–8.1)

## 2021-12-13 LAB — LIPASE, BLOOD: Lipase: 28 U/L (ref 11–51)

## 2021-12-13 LAB — CBC
HCT: 41.3 % (ref 36.0–46.0)
Hemoglobin: 13.1 g/dL (ref 12.0–15.0)
MCH: 28.7 pg (ref 26.0–34.0)
MCHC: 31.7 g/dL (ref 30.0–36.0)
MCV: 90.6 fL (ref 80.0–100.0)
Platelets: 251 10*3/uL (ref 150–400)
RBC: 4.56 MIL/uL (ref 3.87–5.11)
RDW: 14.6 % (ref 11.5–15.5)
WBC: 6.7 10*3/uL (ref 4.0–10.5)
nRBC: 0 % (ref 0.0–0.2)

## 2021-12-13 MED ORDER — PROMETHAZINE HCL 12.5 MG PO TABS: 12.5000 mg | ORAL_TABLET | Freq: Four times a day (QID) | ORAL | 0 refills | Status: DC | PRN

## 2021-12-13 MED ORDER — SODIUM CHLORIDE 0.9 % IV SOLN
12.5000 mg | Freq: Once | INTRAVENOUS | Status: AC
  Administered 2021-12-13: 12.5 mg via INTRAVENOUS
  Filled 2021-12-13: qty 12.5

## 2021-12-13 MED ORDER — SODIUM CHLORIDE 0.9 % IV BOLUS
500.0000 mL | Freq: Once | INTRAVENOUS | Status: AC
Start: 2021-12-13 — End: 2021-12-13
  Administered 2021-12-13: 500 mL via INTRAVENOUS

## 2021-12-13 NOTE — ED Provider Notes (Signed)
? ?[facility] ?Provider Note ? ? ? Event Date/Time  ? First MD Initiated Contact with Patient 12/13/21 1329   ?  (approximate) ? ? ?History  ? ?Nausea ? ? ?HPI ? ?KENISE BARRACO is a 66 y.o. female with a history of tremors, anxiety, insomnia who presents with complaints of nausea.  Patient reports that the nausea is a side effect of the medication she is taking.  She states typically improves with Phenergan.  She had Zofran via EMS but still feels nauseated.  Denies abdominal pain.  No chest pain. ?  ? ? ?Physical Exam  ? ?Triage Vital Signs: ?ED Triage Vitals  ?Enc Vitals Group  ?   BP 12/13/21 1247 (!) 157/86  ?   Pulse Rate 12/13/21 1247 89  ?   Resp 12/13/21 1247 16  ?   Temp 12/13/21 1247 98.3 ?F (36.8 ?C)  ?   Temp Source 12/13/21 1247 Oral  ?   SpO2 12/13/21 1247 99 %  ?   Weight 12/13/21 1248 71.2 kg (157 lb)  ?   Height 12/13/21 1248 1.651 m ('5\' 5"'$ )  ?   Head Circumference --   ?   Peak Flow --   ?   Pain Score 12/13/21 1247 0  ?   Pain Loc --   ?   Pain Edu? --   ?   Excl. in [place]? --   ? ? ?Most recent vital signs: ?Vitals:  ? 12/13/21 1247  ?BP: (!) 157/86  ?Pulse: 89  ?Resp: 16  ?Temp: 98.3 ?F (36.8 ?C)  ?SpO2: 99%  ? ? ? ?General: Awake, no distress.  ?CV:  Good peripheral perfusion.  ?Resp:  Normal effort.  ?Abd:  No distention.  Soft, nontender, reassuring exam ?Other:   ? ? ?ED Results / Procedures / Treatments  ? ?Labs ?(all labs ordered are listed, but only abnormal results are displayed) ?Labs Reviewed  ?COMPREHENSIVE METABOLIC PANEL - Abnormal; Notable for the following components:  ?    Result Value  ? Glucose, Bld 123 (*)   ? Creatinine, Ser 1.02 (*)   ? All other components within normal limits  ?LIPASE, BLOOD  ?CBC  ?URINALYSIS, ROUTINE W REFLEX MICROSCOPIC  ? ? ? ?EKG ? ? ? ? ?RADIOLOGY ? ? ? ? ?PROCEDURES: ? ?Critical Care performed:  ? ?Procedures ? ? ?MEDICATIONS ORDERED IN ED: ?Medications  ?promethazine (PHENERGAN) 12.5 mg in sodium chloride 0.9 % 50 mL IVPB (12.5  mg Intravenous New Bag/Given 12/13/21 1430)  ?sodium chloride 0.9 % bolus 500 mL (500 mLs Intravenous New Bag/Given 12/13/21 1428)  ? ? ? ?IMPRESSION / MDM / ASSESSMENT AND PLAN / ED COURSE  ?I reviewed the triage vital signs and the nursing notes. ? ? ?Patient well-appearing and in no acute distress, complains of mild nausea related to medication use, she reports she had this made times before.  Lab work today is quite reassuring, CBC CMP are unremarkable, glucose is normal. ? ?Abdominal exam is benign, will treat with IV fluids, IV Phenergan and reevaluate. ? ?----------------------------------------- ?3:27 PM on 12/13/2021 ?----------------------------------------- ?Patient reports her nausea is relieved, she is prepped for discharge at this time with outpatient follow-up. ? ? ? ? ?  ? ? ?FINAL CLINICAL IMPRESSION(S) / ED DIAGNOSES  ? ?Final diagnoses:  ?Nausea  ? ? ? ?Rx / DC Orders  ? ?ED Discharge Orders   ? ?      Ordered  ?  promethazine (PHENERGAN) 12.5 MG tablet  Every 6 hours PRN       ? 12/13/21 1525  ? ?  ?  ? ?  ? ? ? ?Note:  This document was prepared using Dragon voice recognition software and may include unintentional dictation errors. ?  ?Lavonia Drafts, MD ?12/13/21 1527 ? ?

## 2021-12-13 NOTE — ED Triage Notes (Signed)
See first nurse note, denies any abd pain, states she has stuck her finger down her throat hoping if she vomiting it would relieve her nausea. Pt has a white substance on her lips when asked states it is mylanta she took earlier.  ?

## 2021-12-13 NOTE — ED Triage Notes (Signed)
First Nurse Note:  ?Pt via EMS from home. Pt c/o nausea, no vomiting since this AM. Pt is A&Ox4 and NAD ? ?12 lead unremarkable  ?EMS gave 4 of Zofran  ?20 G L AC  ? ?87 HR  ?98%  ?117/78 BP  ?99.0 orally  ?155 CBG ? ?

## 2021-12-16 ENCOUNTER — Ambulatory Visit: Payer: Medicare Other | Attending: Neurology | Admitting: Physical Therapy

## 2021-12-16 ENCOUNTER — Encounter: Payer: Self-pay | Admitting: Physical Therapy

## 2021-12-16 DIAGNOSIS — R278 Other lack of coordination: Secondary | ICD-10-CM | POA: Diagnosis present

## 2021-12-16 DIAGNOSIS — R2689 Other abnormalities of gait and mobility: Secondary | ICD-10-CM | POA: Diagnosis present

## 2021-12-16 DIAGNOSIS — R2681 Unsteadiness on feet: Secondary | ICD-10-CM | POA: Insufficient documentation

## 2021-12-16 NOTE — Patient Instructions (Signed)
Access Code: [email or web address] ?URL: [email or web address] ?Date: 12/16/2021 ?Prepared by: Rivka Barbara ? ?Exercises ?- Heel Toe Raises with Counter Support  - 1 x daily - 5 x weekly - 2 sets - 15 reps ?- Standing March with Counter Support  - 1 x daily - 5 x weekly - 2 sets - 10 reps ?- Lunge with Counter Support  - 1 x daily - 5 x weekly - 2 sets - 10 reps ?- Side Stepping with Counter Support  - 1 x daily - 5 x weekly - 2 sets - 10 reps ?

## 2021-12-16 NOTE — Therapy (Signed)
[facility] ?[facility] ?[place], [place], [address] ?Phone: [phone]   Fax:  [phone] ? ?Physical Therapy Treatment ? ?Patient Details  ?Name: [name] [name] ?MRN: [id number] ?Date of Birth: Dec 28, 1955 ?Referring Provider (PT): Dr. Manuella Ghazi ? ? ?Encounter Date: 12/16/2021 ? ? PT End of Session - 12/16/21 0913   ? ? Visit Number 2   ? Number of Visits 17   ? Date for PT Re-Evaluation 02/02/22   ? Authorization Type Medicare   ? Authorization Time Period start of care 12/07/21   ? PT Start Time [phone]   ? PT Stop Time 0929   ? PT Time Calculation (min) 42 min   ? Equipment Utilized During Treatment Gait belt   ? Activity Tolerance Patient tolerated treatment well;No increased pain   ? Behavior During Therapy [facility] for tasks assessed/performed   ? ?  ?  ? ?  ? ? ?Past Medical History:  ?Diagnosis Date  ? Actinic keratosis   ? Acute hepatitis C without mention of hepatic coma(070.51)   ? 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 Carpal tunnel syndrome of left wrist   ? Depression   ? GERD (gastroesophageal reflux disease)   ? Hypothyroidism   ? Insomnia   ? Insomnia   ? Memory loss   ? Osteoporosis   ? Tremor   ? Wears dentures   ? full upper, partial lower  ? ? ?Past Surgical History:  ?Procedure Laterality Date  ? ANKLE SURGERY Right   ? BLADDER TACT    ? CATARACT EXTRACTION W/PHACO Left 10/13/2021  ? Procedure: CATARACT EXTRACTION PHACO AND INTRAOCULAR LENS PLACEMENT ([place]) LEFT;  Surgeon: Leandrew Koyanagi, MD;  Location: [place];  Service: Ophthalmology;  Laterality: Left;  3.97 ?00:49.0  ? CATARACT EXTRACTION W/PHACO Right 10/27/2021  ? Procedure: CATARACT EXTRACTION PHACO AND INTRAOCULAR LENS PLACEMENT ([place]) RIGHT 4.50 00:58.9;  Surgeon: Leandrew Koyanagi, MD;  Location: [place];  Service: Ophthalmology;  Laterality: Right;  ? HAND SURGERY Right   ? TONSILLECTOMY    ? ? ?There were no vitals filed for this visit. ? ? Subjective Assessment - 12/16/21  0849   ? ? Subjective Pt reports one incidence of LOB since last session. Pt reports she was shaking all over when she had to go to the ED when she had nausea and was dry heaving. Pt reports she had to wait a very long time in the ED.   ? 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 She reports having trouble with balance and tremors. She reports falling often. She presents to therapy without AD. She has 3 walkers (apartment is too tiny and so she furniture walks). 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 Neurologist did prescribe Carvidopa/levadopa, did have side effects of nausea/vomitting. She has stopped taking medication,  but is hoping to see Neurologist again to see if she can get another medication to help control side effects. 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 Limitations Standing;Walking   ? How long can you sit comfortably? NA   ? How long can you stand comfortably?  10 min- legs start to ache   ? How long can you walk comfortably? about 500 feet, occasionally LE pain   ? Diagnostic tests MRI/CT scan negative for Parkinson's disease;   ? Patient Stated Goals "Stop falling, be able to walk farther, improve balance."   ? Currently in Pain? No/denies   ? Pain Score 0-No pain   Some shoulder/ back pain earlier today.  ? ?  ?  ? ?  ? ? ? ? ? ? ? ? ? ?Exercise/Activity Sets/Reps/Time/ Resistance Assistance Charge type Comments  ?Side lunges with UE support  X 10 ea side  CGA, UE  NMR Difficulty with coordination and sequencing with lunge portion of exercise   ?Airex side step up and step downs X 10  with UE support  ?X 10 without UE support  CGA NMR  3RPE (easy) with UE support, 5 RPE and unsteadines without UE support   ?HEP   Therex  Access Code: [email or web address] ?URL: [email or web address] ?Date: 12/16/2021 ?Prepared by: Rivka Barbara ? ?Exercises ?- Heel Toe Raises with Counter Support  - 1 x daily - 5 x weekly - 2 sets - 15 reps ?- Standing March with Counter Support  - 1 x daily - 5 x weekly - 2 sets - 10 reps ?- Lunge with Counter Support  - 1 x daily - 5 x weekly - 2 sets - 10 reps ?- Side Stepping with Counter Support  - 1 x daily - 5 x weekly - 2 sets - 10 reps  ?      ?      ?      ?      ?      ?      ?      ?      ?Treatment Provided this session  ? ?Pt educated throughout session about proper posture and technique with exercises. Improved exercise technique, movement at target joints, use of target muscles after min to mod verbal, visual, tactile cues. ?Note: Portions of this document were prepared using Dragon voice recognition software and although reviewed may contain unintentional dictation errors in syntax, grammar, or spelling. ? ? ? ? ? ? ? ? ? ? ? ? ? ? ? ? ? ? ? ? ? PT Education - 12/16/21 0912   ? ? Education Details HEP   ? Person(s) Educated Patient   ? Methods Explanation   ? Comprehension Returned demonstration;Verbalized understanding   ? ?  ?  ? ?  ? ? ? PT Short Term Goals - 12/08/21 2117   ? ?  ? PT SHORT TERM GOAL #1  ? Title Patient will be adherent with HEP at least 3x a week to improve balance/strength and mobility;   ? Baseline 4/25: doesn't have a HEP   ? Time 4   ? Period Weeks   ? Status New   ? Target Date 01/05/22   ?  ? PT SHORT TERM GOAL #2  ? Title Patient will be able to transfer sit<>Stand without pushing on arm rest with normal base of support, reporting minimal difficulty to improve transfer ability with ADLs.   ? Baseline 4/25: requires wide base of support and reports moderate difficulty;   ? Time 4   ? Period Weeks   ? Status New   ? Target Date  01/05/22   ? ?  ?  ? ?  ? ? ? ? PT Long Term Goals - 12/08/21 2119   ? ?  ?  PT LONG TERM GOAL #1  ? Title Patient will improve 10 meter walk test to >1.2 m/s without AD to return to community ambulator gait speed and reduce fall risk.   ? Baseline 4/25: 1.13 m/s   ? Time 8   ? Period Weeks   ? Status New   ? Target Date 02/02/22   ?  ? PT LONG TERM GOAL #2  ? Title Patient will improve functional gait assessment to >22/30 to indicate low risk for falls;   ? Baseline 4/25: 20/30   ? Time 8   ? Period Weeks   ? Status New   ? Target Date 02/02/22   ?  ? PT LONG TERM GOAL #3  ? Title Patient will improve FOTO score by 5% to indicate improved functional mobility with ADLs.   ? Baseline 4/25: 57%   ? Time 8   ? Period Weeks   ? Status New   ? Target Date 02/02/22   ?  ? PT LONG TERM GOAL #4  ? Title Patient will be able to hold SLS for >5 sec each LE to indicate improved functional strength for negotiating obstacles and reduce fall risk.   ? Baseline 4/25: R: 5 sec, L 1 sec   ? Time 8   ? Period Weeks   ? Status New   ? Target Date 02/02/22   ? ?  ?  ? ?  ? ? ? ? ? ? ? ? Plan - 12/16/21 0913   ? ? Clinical Impression Statement Patient presents to physical therapy session with excellent motivation for completion of activities.  Patient introduced to several dynamic lower extremity balance exercises provided with handout for completion at home and all questions answered regarding these.  Patient had most difficulty with lateral movements involving lunging and shifting weight onto each lower extremity.  Seen patient had coordination difficulties with these activities.  We will continue to work on these activities in future sessions in order to improve patient's balance, prevent falls, and improve her quality of life.   ? Personal Factors and Comorbidities Comorbidity 3+   ? Comorbidities PMH: arthritis in hands/fingers, GERD, insomnia(sleeps 6 hours a night), Depression (Neurologist wants her to see MD to have medications  re-evaluated); tremors, Hypothyroidism; LLE knee replacement (3 years ago)   ? Examination-Activity Limitations Locomotion Level;Stairs;Stand   ? Examination-Participation Restrictions Cleaning;Community Act

## 2021-12-21 ENCOUNTER — Encounter: Payer: Self-pay | Admitting: Physical Therapy

## 2021-12-21 ENCOUNTER — Ambulatory Visit: Payer: Medicare Other | Admitting: Physical Therapy

## 2021-12-21 DIAGNOSIS — R2681 Unsteadiness on feet: Secondary | ICD-10-CM | POA: Diagnosis not present

## 2021-12-21 DIAGNOSIS — R2689 Other abnormalities of gait and mobility: Secondary | ICD-10-CM

## 2021-12-21 NOTE — Therapy (Signed)
[place] [facility] ?[facility] ?[place], [place], [address] ?Phone: [phone]   Fax:  [phone] ? ?Physical Therapy Treatment ? ?Patient Details  ?Name: [name] [name] ?MRN: [id number] ?Date of Birth: [birth year] ?Referring Provider (PT): Dr. Manuella Ghazi ? ? ?Encounter Date: 12/21/2021 ? ? PT End of Session - 12/21/21 0906   ? ? Visit Number 3   ? Number of Visits 17   ? Date for PT Re-Evaluation 02/02/22   ? Authorization Type Medicare   ? Authorization Time Period start of care 12/07/21   ? PT Start Time [phone]   ? PT Stop Time 0930   ? PT Time Calculation (min) 36 min   ? Equipment Utilized During Treatment Gait belt   ? Activity Tolerance Patient tolerated treatment well;No increased pain   ? Behavior During Therapy [facility] for tasks assessed/performed   ? ?  ?  ? ?  ? ? ?Past Medical History:  ?Diagnosis Date  ? Actinic keratosis   ? Acute hepatitis C without mention of hepatic coma(070.51)   ? 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 Carpal tunnel syndrome of left wrist   ? Depression   ? GERD (gastroesophageal reflux disease)   ? Hypothyroidism   ? Insomnia   ? Insomnia   ? Memory loss   ? Osteoporosis   ? Tremor   ? Wears dentures   ? full upper, partial lower  ? ? ?Past Surgical History:  ?Procedure Laterality Date  ? ANKLE SURGERY Right   ? BLADDER TACT    ? CATARACT EXTRACTION W/PHACO Left 10/13/2021  ? Procedure: CATARACT EXTRACTION PHACO AND INTRAOCULAR LENS PLACEMENT ([place]) LEFT;  Surgeon: Leandrew Koyanagi, MD;  Location: [facility] [facility];  Service: Ophthalmology;  Laterality: Left;  3.97 ?00:49.0  ? CATARACT EXTRACTION W/PHACO Right 10/27/2021  ? Procedure: CATARACT EXTRACTION PHACO AND INTRAOCULAR LENS PLACEMENT ([place]) RIGHT 4.50 00:58.9;  Surgeon: Leandrew Koyanagi, MD;  Location: [facility];  Service: Ophthalmology;  Laterality: Right;  ? HAND SURGERY Right   ? TONSILLECTOMY    ? ? ?There were no vitals filed for this visit. ? ? Subjective Assessment - 12/21/21  0856   ? ? Subjective Pt reports difficulty with anxiety over the weekend secondary to fear of falling. Pt reports she would like to use pick to break up the ground ( sledgehammer type movement). Pt reports no pain at this time. Pt reports she " could have done HEP more" and reports difficulty remembering to complete them.   ? 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 She reports having trouble with balance and tremors. She reports falling often. She presents to therapy without AD. She has 3 walkers (apartment is too tiny and so she furniture walks). 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 Neurologist did prescribe Carvidopa/levadopa, did have side effects of nausea/vomitting. She has stopped taking medication,  but is hoping to see Neurologist again to see if she can get another medication to help control side effects. 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 Limitations Standing;Walking   ? How long can you sit comfortably? NA   ?  How long can you stand comfortably? 10 min- legs start to ache   ? How long can you walk comfortably? about 500 feet, occasionally LE pain   ? Diagnostic tests MRI/CT scan negative for Parkinson's disease;   ? Patient Stated Goals "Stop falling, be able to walk farther, improve balance."   ? ?  ?  ? ?  ? ? ? ? ?Exercise/Activity Sets/Reps/Time/ Resistance Assistance Charge type Comments  ?Ladder drills  ?Sidestepping  ?Sidestepping with airex pads at end of each ladder  ?Normal stepping ( 1 foot each) with airex pads at end .  X 4 ea  Cga  There ACT  Cues for slow and  deliberate movement.  Difficulty with transitions to and from Airex pad with sidestepping but less difficulty noted with normal walking stepping on and off Airex pad.  ?1 foot on step plus airex and 1 foot on floor making similar movement with PVC to // bars to simulate  using pick axe for gardening X 15 ea LE on pad/ floor  CGA There ACT  Cues for slow and deliberate rate movements and for correct sequencing for optimal movement mechanics. Improved with practice   ?Sidestepping on airex beam and using PVC pipe to strike // bar   X 2 laps  CGA  There ACT Good balance noted on unsteady surface with this with no upper extremity support.  Patient does report her activity will be more difficult due to weight of had a pick ax but did feel like her comfort with this was improving with cues for slow and deliberate movements.  ?      ?      ?      ?      ?      ?      ?      ?      ?Treatment Provided this session  ?Pt session was cut a little short today due to pt arriving a few minutes late to scheduled appointment time ?Pt educated throughout session about proper posture and technique with exercises. Improved exercise technique, movement at target joints, use of target muscles after min to mod verbal, visual, tactile cues. ?Note: Portions of this document were prepared using Dragon voice recognition software and although reviewed may contain unintentional dictation errors in syntax, grammar, or spelling. ? ? ? ? ? ? ? ? ? ? ? ? ? ? ? ? ? ? ? ? ? ? ? ? ? ? ? ? PT Short Term Goals - 12/08/21 2117   ? ?  ? PT SHORT TERM GOAL #1  ? Title Patient will be adherent with HEP at least 3x a week to improve balance/strength and mobility;   ? Baseline 4/25: doesn't have a HEP   ? Time 4   ? Period Weeks   ? Status New   ? Target Date 01/05/22   ?  ? PT SHORT TERM GOAL #2  ? Title Patient will be able to transfer sit<>Stand without pushing on arm rest with normal base of support, reporting minimal difficulty to improve transfer  ability with ADLs.   ? Baseline 4/25: requires wide base of support and reports moderate difficulty;   ? Time 4   ? Period Weeks   ? Status New   ? Target Date 01/05/22   ? ?  ?  ? ?  ? ? ? ? PT Long Term Goals - 12/08/21 2119   ? ?  ? PT  LONG TERM GOAL #1  ? Title Patient will improve 10 meter walk test to >1.2 m/s without AD to return to community ambulator gait speed and reduce fall risk.   ? Baseline 4/25: 1.13 m/s   ? Time 8   ? Period Weeks   ? Status New   ? Target Date 02/02/22   ?  ? PT LONG TERM GOAL #2  ? Title Patient will improve functional gait assessment to >22/30 to indicate low risk for falls;   ? Baseline 4/25: 20/30   ? Time 8   ? Period Weeks   ? Status New   ? Target Date 02/02/22   ?  ? PT LONG TERM GOAL #3  ? Title Patient will improve FOTO score by 5% to indicate improved functional mobility with ADLs.   ? Baseline 4/25: 57%   ? Time 8   ? Period Weeks   ? Status New   ? Target Date 02/02/22   ?  ? PT LONG TERM GOAL #4  ? Title Patient will be able to hold SLS for >5 sec each LE to indicate improved functional strength for negotiating obstacles and reduce fall risk.   ? Baseline 4/25: R: 5 sec, L 1 sec   ? Time 8   ? Period Weeks   ? Status New   ? Target Date 02/02/22   ? ?  ?  ? ?  ? ? ? ? ? ? ? ? Plan - 12/21/21 0907   ? ? Clinical Impression Statement Pt presents with good motivation for completion of PT activities.  Physical therapist is on interventions in order to improve patient's ability to perform yard work activities on uneven and unlevel terrain.  Patient responded well to these interventions but did require cues for slow and deliberate movement.  Patient also instructed in pacing as well as rest breaks with completing these activities in order to ensure her safety.  Patient also instructed in ways to improve her tolerance with exercise program by incorporating into her daily routine to establish as a habit.  Patient will continue to benefit from skilled physical therapy  intervention in order to improve her lower extremity strength, coordination, balance, and improve her overall function.   ? Personal Factors and Comorbidities Comorbidity 3+   ? Comorbidities PMH: arthritis in hands/fingers,

## 2021-12-23 ENCOUNTER — Encounter: Payer: Self-pay | Admitting: Physical Therapy

## 2021-12-23 ENCOUNTER — Ambulatory Visit: Payer: Medicare Other | Admitting: Physical Therapy

## 2021-12-23 DIAGNOSIS — R2681 Unsteadiness on feet: Secondary | ICD-10-CM

## 2021-12-23 DIAGNOSIS — R2689 Other abnormalities of gait and mobility: Secondary | ICD-10-CM

## 2021-12-23 NOTE — Therapy (Signed)
?[facility] ?[place], [place], [address] ?Phone: [phone]   Fax:  [phone] ? ?Physical Therapy Treatment ? ?Patient Details  ?Name: [name] [name] ?MRN: [id number] ?Date of Birth: 02-Jul-1956 ?Referring Provider (PT): Dr. Manuella Ghazi ? ? ?Encounter Date: 12/23/2021 ? ? PT End of Session - 12/23/21 1153   ? ? Visit Number 4   ? Number of Visits 17   ? Date for PT Re-Evaluation 02/02/22   ? Authorization Type Medicare   ? Authorization Time Period start of care 12/07/21   ? PT Start Time 1149   ? PT Stop Time 1228   ? PT Time Calculation (min) 39 min   ? Equipment Utilized During Treatment Gait belt   ? Activity Tolerance Patient tolerated treatment well;No increased pain   ? Behavior During Therapy [facility] for tasks assessed/performed   ? ?  ?  ? ?  ? ? ?Past Medical History:  ?Diagnosis Date  ? Actinic keratosis   ? Acute hepatitis C without mention of hepatic coma(070.51)   ? 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 Carpal tunnel syndrome of left wrist   ? Depression   ? GERD (gastroesophageal reflux disease)   ? Hypothyroidism   ? Insomnia   ? Insomnia   ? Memory loss   ? Osteoporosis   ? Tremor   ? Wears dentures   ? full upper, partial lower  ? ? ?Past Surgical History:  ?Procedure Laterality Date  ? ANKLE SURGERY Right   ? BLADDER TACT    ? CATARACT EXTRACTION W/PHACO Left 10/13/2021  ? Procedure: CATARACT EXTRACTION PHACO AND INTRAOCULAR LENS PLACEMENT ([place]) LEFT;  Surgeon: Leandrew Koyanagi, MD;  Location: [facility];  Service: Ophthalmology;  Laterality: Left;  3.97 ?00:49.0  ? CATARACT EXTRACTION W/PHACO Right 10/27/2021  ? Procedure: CATARACT EXTRACTION PHACO AND INTRAOCULAR LENS PLACEMENT ([place]) RIGHT 4.50 00:58.9;  Surgeon: Leandrew Koyanagi, MD;  Location: [facility];  Service: Ophthalmology;  Laterality: Right;  ? HAND SURGERY Right   ? TONSILLECTOMY    ? ? ?There were no vitals filed for this visit. ? ? Subjective Assessment - 12/23/21  1152   ? ? Subjective Pt reports visit with MD went well. Reports she decreased medicine due to nausea and will progress up to previously prescribed amount over time.   ? 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 She reports having trouble with balance and tremors. She reports falling often. She presents to therapy without AD. She has 3 walkers (apartment is too tiny and so she furniture walks). 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 Neurologist did prescribe Carvidopa/levadopa, did have side effects of nausea/vomitting. She has stopped taking medication,  but is hoping to see Neurologist again to see if she can get another medication to help control side effects. 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 Limitations Standing;Walking   ? How long can you sit comfortably? NA   ? How long can you stand comfortably? 10 min- legs start to ache   ? How long can you walk comfortably? about 500 feet, occasionally LE pain   ?  Diagnostic tests MRI/CT scan negative for Parkinson's disease;   ? Patient Stated Goals "Stop falling, be able to walk farther, improve balance."   ? ?  ?  ? ?  ? ? ? ? ?Exercise/Activity Sets/Reps/Time/ Resistance Assistance Charge type Comments- Unless otherwise stated, CGA was provided and gait belt donned in order to ensure pt safety  ?Ladder drills  ?Sidestepping with airex pads at end of each ladder  ?Normal stepping ( 1 foot each) with airex pads at end .  X 4 ea  Cga  NMR  Cues for slow and deliberate movement.  Difficulty with transitions to and from Airex pad with sidestepping but  less difficulty noted with normal walking stepping on and off Airex pad.  ?On floor tapping hedgehogs on step on command of PT  X 1 minute CGA  NMR  Rated easy progressed to following   ?On airex tapping hedgehogs (4) 2 x 1 minute   NMR  Increased difficulty with this activity.   ?Cable columns side stepping  7.5# laterally 3 laps ea  CGA  NMR Increased difficulty stepping to right but improved with practice   ?On airex stepping laterally to step plus hedgehog  2 x 10 to ea side   NMR Imbalance noted on first few attempts, improvement with practice   ?Tandem walk with turn around on airex pad ( distance of // bars)  X 6 laps   NMR  To work on dynamic balance with NBOS.   ?      ?      ?      ?      ?      ?Treatment Provided this session  ?Pt session was cut a little short today due to pt arriving a few minutes late to scheduled appointment time ?Pt educated throughout session about proper posture and technique with exercises. Improved exercise technique, movement at target joints, use of target muscles after min to mod verbal, visual, tactile cues. ?Note: Portions of this document were prepared using Dragon voice recognition software and although reviewed may contain unintentional dictation errors in syntax, grammar, or spelling. ? ? ? ? ? ? ? ? ? ? ? ? ? ? ? ? ? ? ? ? ? ? ? ? ? ? ? ? ? ? ? ? PT Education - 12/23/21 1153   ? ? Education Details Exercise form and technique   ? Person(s) Educated Patient   ? Methods Explanation   ? Comprehension Verbalized understanding   ? ?  ?  ? ?  ? ? ? PT Short Term Goals - 12/08/21 2117   ? ?  ? PT SHORT TERM GOAL #1  ? Title Patient will be adherent with HEP at least 3x a week to improve balance/strength and mobility;   ? Baseline 4/25: doesn't have a HEP   ? Time 4   ? Period Weeks   ? Status New   ? Target Date 01/05/22   ?  ? PT SHORT TERM GOAL #2  ? Title Patient will be able to transfer sit<>Stand without pushing on arm rest with normal base of support, reporting minimal  difficulty to improve transfer ability with ADLs.   ? Baseline 4/25: requires wide base of support and reports moderate difficulty;   ? Time 4   ? Period Weeks   ? Status New   ? Target Date 01/05/22   ? ?  ?  ? ?  ? ? ? ? PT  Long Term Goals - 12/08/21 2119   ? ?  ? PT LONG TERM GOAL #1  ? Title Patient will improve 10 meter walk test to >1.2 m/s without AD to return to community ambulator gait speed and reduce fall risk.   ? Baseline 4/25: 1.13 m/s   ? Time 8   ? Period Weeks   ? Status New   ? Target Date 02/02/22   ?  ? PT LONG TERM GOAL #2  ? Title Patient will improve functional gait assessment to >22/30 to indicate low risk for falls;   ? Baseline 4/25: 20/30   ? Time 8   ? Period Weeks   ? Status New   ? Target Date 02/02/22   ?  ? PT LONG TERM GOAL #3  ? Title Patient will improve FOTO score by 5% to indicate improved functional mobility with ADLs.   ? Baseline 4/25: 57%   ? Time 8   ? Period Weeks   ? Status New   ? Target Date 02/02/22   ?  ? PT LONG TERM GOAL #4  ? Title Patient will be able to hold SLS for >5 sec each LE to indicate improved functional strength for negotiating obstacles and reduce fall risk.   ? Baseline 4/25: R: 5 sec, L 1 sec   ? Time 8   ? Period Weeks   ? Status New   ? Target Date 02/02/22   ? ?  ?  ? ?  ? ? ? ? ? ? ? ? Plan - 12/23/21 1154   ? ? Clinical Impression Statement Patient presents with excellent motivation for completion of physical therapy activities.  Physical therapist continued with dynamic balance interventions incorporating coordination as well as lateral stepping activities.  Patient demonstrates significant improvement in coordination and balance with practice but still has difficulty with narrow base of support with dynamic balance activities.  Patient did report improvement in her function ability daily activities at home following previous physical therapy session and reports improved compliance with home exercise program following tips from physical therapist  last session.  Patient will continue to benefit from skilled physical therapy intervention in order to improve her lower extremity strength, coordination, balance, and improve her overall function.

## 2021-12-27 ENCOUNTER — Ambulatory Visit (INDEPENDENT_AMBULATORY_CARE_PROVIDER_SITE_OTHER): Payer: Medicare Other | Admitting: Dermatology

## 2021-12-27 DIAGNOSIS — D485 Neoplasm of uncertain behavior of skin: Secondary | ICD-10-CM

## 2021-12-27 DIAGNOSIS — Z1283 Encounter for screening for malignant neoplasm of skin: Secondary | ICD-10-CM

## 2021-12-27 DIAGNOSIS — L814 Other melanin hyperpigmentation: Secondary | ICD-10-CM | POA: Diagnosis not present

## 2021-12-27 DIAGNOSIS — L570 Actinic keratosis: Secondary | ICD-10-CM | POA: Diagnosis not present

## 2021-12-27 DIAGNOSIS — C4492 Squamous cell carcinoma of skin, unspecified: Secondary | ICD-10-CM

## 2021-12-27 DIAGNOSIS — C44729 Squamous cell carcinoma of skin of left lower limb, including hip: Secondary | ICD-10-CM

## 2021-12-27 DIAGNOSIS — D1800 Hemangioma unspecified site: Secondary | ICD-10-CM

## 2021-12-27 DIAGNOSIS — L821 Other seborrheic keratosis: Secondary | ICD-10-CM | POA: Diagnosis not present

## 2021-12-27 DIAGNOSIS — L578 Other skin changes due to chronic exposure to nonionizing radiation: Secondary | ICD-10-CM

## 2021-12-27 DIAGNOSIS — L820 Inflamed seborrheic keratosis: Secondary | ICD-10-CM | POA: Diagnosis not present

## 2021-12-27 DIAGNOSIS — D229 Melanocytic nevi, unspecified: Secondary | ICD-10-CM | POA: Diagnosis not present

## 2021-12-27 HISTORY — DX: Squamous cell carcinoma of skin, unspecified: C44.92

## 2021-12-27 NOTE — Patient Instructions (Signed)
Cryotherapy Aftercare  Wash gently with soap and water everyday.   Apply Vaseline and Band-Aid daily until healed.    Wound Care Instructions  Cleanse wound gently with soap and water once a day then pat dry with clean gauze. Apply a thing coat of Petrolatum (petroleum jelly, "Vaseline") over the wound (unless you have an allergy to this). We recommend that you use a new, sterile tube of Vaseline. Do not pick or remove scabs. Do not remove the yellow or white "healing tissue" from the base of the wound.  Cover the wound with fresh, clean, nonstick gauze and secure with paper tape. You may use Band-Aids in place of gauze and tape if the would is small enough, but would recommend trimming much of the tape off as there is often too much. Sometimes Band-Aids can irritate the skin.  You should call the office for your biopsy report after 1 week if you have not already been contacted.  If you experience any problems, such as abnormal amounts of bleeding, swelling, significant bruising, significant pain, or evidence of infection, please call the office immediately.  FOR ADULT SURGERY PATIENTS: If you need something for pain relief you may take 1 extra strength Tylenol (acetaminophen) AND 2 Ibuprofen (200mg each) together every 4 hours as needed for pain. (do not take these if you are allergic to them or if you have a reason you should not take them.) Typically, you may only need pain medication for 1 to 3 days.        If You Need Anything After Your Visit  If you have any questions or concerns for your doctor, please call our main line at [phone] and press option 4 to reach your doctor's medical assistant. If no one answers, please leave a voicemail as directed and we will return your call as soon as possible. Messages left after 4 pm will be answered the following business day.   You may also send us a message via MyChart. We typically respond to MyChart messages within 1-2 business  days.  For prescription refills, please ask your pharmacy to contact our office. Our fax number is [phone].  If you have an urgent issue when the clinic is closed that cannot wait until the next business day, you can page your doctor at the number below.    Please note that while we do our best to be available for urgent issues outside of office hours, we are not available 24/7.   If you have an urgent issue and are unable to reach us, you may choose to seek medical care at your doctor's office, retail clinic, urgent care center, or emergency room.  If you have a medical emergency, please immediately call 911 or go to the emergency department.  Pager Numbers  - Dr. Kowalski: [phone]  - Dr. Moye: [phone]  - Dr. Stewart: [phone]  In the event of inclement weather, please call our main line at [phone] for an update on the status of any delays or closures.  Dermatology Medication Tips: Please keep the boxes that topical medications come in in order to help keep track of the instructions about where and how to use these. Pharmacies typically print the medication instructions only on the boxes and not directly on the medication tubes.   If your medication is too expensive, please contact our office at [phone] option 4 or send us a message through MyChart.   We are unable to tell what your co-pay for medications will be   in advance as this is different depending on your insurance coverage. However, we may be able to find a substitute medication at lower cost or fill out paperwork to get insurance to cover a needed medication.   If a prior authorization is required to get your medication covered by your insurance company, please allow us 1-2 business days to complete this process.  Drug prices often vary depending on where the prescription is filled and some pharmacies may offer cheaper prices.  The website www.goodrx.com contains coupons for medications through  different pharmacies. The prices here do not account for what the cost may be with help from insurance (it may be cheaper with your insurance), but the website can give you the price if you did not use any insurance.  - You can print the associated coupon and take it with your prescription to the pharmacy.  - You may also stop by our office during regular business hours and pick up a GoodRx coupon card.  - If you need your prescription sent electronically to a different pharmacy, notify our office through [facility] MyChart or by phone at [phone] option 4.     Si Usted Necesita Algo Despus de Su Visita  Tambin puede enviarnos un mensaje a travs de MyChart. Por lo general respondemos a los mensajes de MyChart en el transcurso de 1 a 2 das hbiles.  Para renovar recetas, por favor pida a su farmacia que se ponga en contacto con nuestra oficina. Nuestro nmero de fax es el [phone].  Si tiene un asunto urgente cuando la clnica est cerrada y que no puede esperar hasta el siguiente da hbil, puede llamar/localizar a su doctor(a) al nmero que aparece a continuacin.   Por favor, tenga en cuenta que aunque hacemos todo lo posible para estar disponibles para asuntos urgentes fuera del horario de oficina, no estamos disponibles las 24 horas del da, los 7 das de la semana.   Si tiene un problema urgente y no puede comunicarse con nosotros, puede optar por buscar atencin mdica  en el consultorio de su doctor(a), en una clnica privada, en un centro de atencin urgente o en una sala de emergencias.  Si tiene una emergencia mdica, por favor llame inmediatamente al 911 o vaya a la sala de emergencias.  Nmeros de bper  - Dr. Kowalski: [phone]  - Dra. Moye: [phone]  - Dra. Stewart: [phone]  En caso de inclemencias del tiempo, por favor llame a nuestra lnea principal al [phone] para una actualizacin sobre el estado de cualquier retraso o cierre.  Consejos  para la medicacin en dermatologa: Por favor, guarde las cajas en las que vienen los medicamentos de uso tpico para ayudarle a seguir las instrucciones sobre dnde y cmo usarlos. Las farmacias generalmente imprimen las instrucciones del medicamento slo en las cajas y no directamente en los tubos del medicamento.   Si su medicamento es muy caro, por favor, pngase en contacto con nuestra oficina llamando al [phone] y presione la opcin 4 o envenos un mensaje a travs de MyChart.   No podemos decirle cul ser su copago por los medicamentos por adelantado ya que esto es diferente dependiendo de la cobertura de su seguro. Sin embargo, es posible que podamos encontrar un medicamento sustituto a menor costo o llenar un formulario para que el seguro cubra el medicamento que se considera necesario.   Si se requiere una autorizacin previa para que su compaa de seguros cubra su medicamento, por favor permtanos de 1 a   2 das hbiles para completar este proceso.  Los precios de los medicamentos varan con frecuencia dependiendo del lugar de dnde se surte la receta y alguna farmacias pueden ofrecer precios ms baratos.  El sitio web www.goodrx.com tiene cupones para medicamentos de diferentes farmacias. Los precios aqu no tienen en cuenta lo que podra costar con la ayuda del seguro (puede ser ms barato con su seguro), pero el sitio web puede darle el precio si no utiliz ningn seguro.  - Puede imprimir el cupn correspondiente y llevarlo con su receta a la farmacia.  - Tambin puede pasar por nuestra oficina durante el horario de atencin regular y recoger una tarjeta de cupones de GoodRx.  - Si necesita que su receta se enve electrnicamente a una farmacia diferente, informe a nuestra oficina a travs de MyChart de [place] o por telfono llamando al [phone] y presione la opcin 4.  

## 2021-12-27 NOTE — Progress Notes (Signed)
New Patient Visit  Subjective  [name] [name] is a 66 y.o. female who presents for the following: Other (New patient - Scaly spots of arms, hands face, legs and back -The patient presents for Total-Body Skin Exam (TBSE) for skin cancer screening and mole check.  The patient has spots, moles and lesions to be evaluated, some may be new or changing and the patient has concerns that these could be cancer./).  The following portions of the chart were reviewed this encounter and updated as appropriate:   Tobacco  Allergies  Meds  Problems  Med Hx  Surg Hx  Fam Hx     Review of Systems:  No other skin or systemic complaints except as noted in HPI or Assessment and Plan.  Objective  Well appearing patient in no apparent distress; mood and affect are within normal limits.  A full examination was performed including scalp, head, eyes, ears, nose, lips, neck, chest, axillae, abdomen, back, buttocks, bilateral upper extremities, bilateral lower extremities, hands, feet, fingers, toes, fingernails, and toenails. All findings within normal limits unless otherwise noted below.  Left pretibial 1.5 cm hyperkeratotic papule  Head - Anterior (Face) (7) Erythematous thin papules/macules with gritty scale.   Legs x 2 , left chest x 1, left arm x 1 (4) Erythematous stuck-on, waxy papule or plaque   Assessment & Plan   Lentigines - Scattered tan macules - Due to sun exposure - Benign-appearing, observe - Recommend daily broad spectrum sunscreen SPF 30+ to sun-exposed areas, reapply every 2 hours as needed. - Call for any changes  Seborrheic Keratoses - Stuck-on, waxy, tan-brown papules and/or plaques  - Benign-appearing - Discussed benign etiology and prognosis. - Observe - Call for any changes  Melanocytic Nevi - Tan-brown and/or pink-flesh-colored symmetric macules and papules - Benign appearing on exam today - Observation - Call clinic for new or changing moles - Recommend daily  use of broad spectrum spf 30+ sunscreen to sun-exposed areas.   Hemangiomas - Red papules - Discussed benign nature - Observe - Call for any changes  Actinic Damage - Chronic condition, secondary to cumulative UV/sun exposure - diffuse scaly erythematous macules with underlying dyspigmentation - Recommend daily broad spectrum sunscreen SPF 30+ to sun-exposed areas, reapply every 2 hours as needed.  - Staying in the shade or wearing long sleeves, sun glasses (UVA+UVB protection) and wide brim hats (4-inch brim around the entire circumference of the hat) are also recommended for sun protection.  - Call for new or changing lesions.  Skin cancer screening performed today.  Neoplasm of uncertain behavior of skin Left pretibial  Epidermal / dermal shaving  Lesion diameter (cm):  1.5 Informed consent: discussed and consent obtained   Timeout: patient name, date of birth, surgical site, and procedure verified   Procedure prep:  Patient was prepped and draped in usual sterile fashion Prep type:  Isopropyl alcohol Anesthesia: the lesion was anesthetized in a standard fashion   Anesthetic:  1% lidocaine w/ epinephrine 1-100,000 buffered w/ 8.4% NaHCO3 Instrument used: flexible razor blade   Hemostasis achieved with: pressure, aluminum chloride and electrodesiccation   Outcome: patient tolerated procedure well   Post-procedure details: sterile dressing applied and wound care instructions given   Dressing type: bandage and petrolatum    Destruction of lesion Complexity: extensive   Destruction method: electrodesiccation and curettage   Informed consent: discussed and consent obtained   Timeout:  patient name, date of birth, surgical site, and procedure verified Procedure prep:  Patient  was prepped and draped in usual sterile fashion Prep type:  Isopropyl alcohol Anesthesia: the lesion was anesthetized in a standard fashion   Anesthetic:  1% lidocaine w/ epinephrine 1-100,000 buffered w/  8.4% NaHCO3 Curettage performed in three different directions: Yes   Electrodesiccation performed over the curetted area: Yes   Lesion length (cm):  1.5 Lesion width (cm):  1.5 Margin per side (cm):  0.3 Final wound size (cm):  2.1 Hemostasis achieved with:  pressure and aluminum chloride Outcome: patient tolerated procedure well with no complications   Post-procedure details: sterile dressing applied and wound care instructions given   Dressing type: bandage and petrolatum    Specimen 1 - Surgical pathology Differential Diagnosis: SCC vs other Check Margins: No EDC today  AK (actinic keratosis) (7) Head - Anterior (Face)  Destruction of lesion - Head - Anterior (Face) Complexity: simple   Destruction method: cryotherapy   Informed consent: discussed and consent obtained   Timeout:  patient name, date of birth, surgical site, and procedure verified Lesion destroyed using liquid nitrogen: Yes   Region frozen until ice ball extended beyond lesion: Yes   Outcome: patient tolerated procedure well with no complications   Post-procedure details: wound care instructions given    Inflamed seborrheic keratosis (4) Legs x 2 , left chest x 1, left arm x 1  Destruction of lesion - Legs x 2 , left chest x 1, left arm x 1 Complexity: simple   Destruction method: cryotherapy   Informed consent: discussed and consent obtained   Timeout:  patient name, date of birth, surgical site, and procedure verified Lesion destroyed using liquid nitrogen: Yes   Region frozen until ice ball extended beyond lesion: Yes   Outcome: patient tolerated procedure well with no complications   Post-procedure details: wound care instructions given    Skin cancer screening  SCC (squamous cell carcinoma), leg, left   Return in about 6 months (around 06/29/2022).  Documentation: I have reviewed the above documentation for accuracy and completeness, and I agree with the above.  Sarina Ser, MD  I,  Ashok Cordia, CMA, am acting as scribe for Sarina Ser, MD . Documentation: I have reviewed the above documentation for accuracy and completeness, and I agree with the above.  Sarina Ser, MD

## 2021-12-29 ENCOUNTER — Ambulatory Visit: Payer: Medicare Other

## 2021-12-29 ENCOUNTER — Telehealth: Payer: Self-pay

## 2021-12-29 DIAGNOSIS — R278 Other lack of coordination: Secondary | ICD-10-CM

## 2021-12-29 DIAGNOSIS — R2681 Unsteadiness on feet: Secondary | ICD-10-CM | POA: Diagnosis not present

## 2021-12-29 NOTE — Telephone Encounter (Signed)
Advised patient of results/hd  

## 2021-12-29 NOTE — Telephone Encounter (Signed)
-----   Message from Ralene Bathe, MD sent at 12/28/2021  4:52 PM EDT ----- ?Diagnosis ?Skin (M), left pretibial ?SQUAMOUS CELL CARCINOMA, KERATOACANTHOMA TYPE ? ?Cancer - SCC ?Already treated ?Recheck next visit ?

## 2021-12-29 NOTE — Therapy (Signed)
[place] MAIN [facility] SERVICES [address] [place], [place], [address] Phone: [phone]   Fax:  [phone]  Physical Therapy Treatment  Patient Details  Name: CHARMEL PRONOVOST MRN: [id number] Date of Birth: 07-04-1956 Referring Provider (PT): Dr. Manuella Ghazi   Encounter Date: 12/29/2021   PT End of Session - 12/30/21 0818     Visit Number 5    Number of Visits 17    Date for PT Re-Evaluation 02/02/22    Authorization Type Medicare    Authorization Time Period start of care 12/07/21    PT Start Time 1024    PT Stop Time 1100    PT Time Calculation (min) 36 min    Equipment Utilized During Treatment Gait belt    Activity Tolerance Patient tolerated treatment well;No increased pain    Behavior During Therapy WFL for tasks assessed/performed             Past Medical History:  Diagnosis Date   Actinic keratosis    Acute hepatitis C without mention of hepatic coma(070.51)    Arthritis    fingers   Carpal tunnel syndrome of left wrist    Depression    GERD (gastroesophageal reflux disease)    Hypothyroidism    Insomnia    Insomnia    Memory loss    Osteoporosis    Squamous cell carcinoma of skin 12/27/2021   Left pretibial - EDC   Tremor    Wears dentures    full upper, partial lower    Past Surgical History:  Procedure Laterality Date   ANKLE SURGERY Right    BLADDER TACT     CATARACT EXTRACTION W/PHACO Left 10/13/2021   Procedure: CATARACT EXTRACTION PHACO AND INTRAOCULAR LENS PLACEMENT ([place]) LEFT;  Surgeon: Leandrew Koyanagi, MD;  Location: [place];  Service: Ophthalmology;  Laterality: Left;  3.97 00:49.0   CATARACT EXTRACTION W/PHACO Right 10/27/2021   Procedure: CATARACT EXTRACTION PHACO AND INTRAOCULAR LENS PLACEMENT ([place]) RIGHT 4.50 00:58.9;  Surgeon: Leandrew Koyanagi, MD;  Location: [place];  Service: Ophthalmology;  Laterality: Right;   HAND SURGERY Right    TONSILLECTOMY      There were  no vitals filed for this visit.   Subjective Assessment - 12/29/21 1024     Subjective Pt has fallen 2x since last session. Pt reports no injury. No other stumbles. Pt reports no aches, pains.    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 She reports having trouble with balance and tremors. She reports falling often. She presents to therapy without AD. She has 3 walkers (apartment is too tiny and so she furniture walks). 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 Neurologist did prescribe Carvidopa/levadopa, did have side effects of nausea/vomitting. She has stopped taking medication,  but is hoping to see Neurologist again to see if she can get another medication to help control side effects. 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 NA    How long can you stand comfortably? 10 min- legs start to ache    How long can  you walk comfortably? about 500 feet, occasionally LE pain    Diagnostic tests MRI/CT scan negative for Parkinson's disease;    Patient Stated Goals "Stop falling, be able to walk farther, improve balance."    Currently in Pain? No/denies             INTERVENTIONS       Exercise/Activity Sets/Reps/Time/ Resistance Assistance Charge type Comments- Unless otherwise stated, CGA was provided and gait belt donned in order to ensure pt safety  On airex marching  2x20 alt LE CGA NMR Able to perform hands free, cuing for decreased speed/improved eccentric control  On airex alt. Step onto 6" step 2x20 alt LE CGA NMR    Lateral stepping onto and off of airex X multiple reps B CGA NMR No unsteadiness  Forward/backward stepping onto and off airex X multiple reps CGA NMR No unsteadiness  Airex WBOS and NBOS with EC 2x30 sec WBOS 2x50 sec EC CGA-min a NMR Pt LOB to R, requires UE support and min assist to correct and prevent fall.  Ambulating over large red mat FWD/BCKWD/ side stepping, turning X several minutes CGA NMR No LOB, few instances of mild unsteadiness, improves with reps  Korebalance: Tux balance 2 rounds CGA NMR Begins with BUE support to UUE support, attempts no UE support, however, too challenging. Performed for anticipatory postural control, weight-shifting  Korebalance: pop dot 2 rounds CGA NMR Begins with BUE support to UUE support, attempts no UE support. Pt states, "I didn't really feel like I had full control" Performed for anticipatory postural control, weight-shifting  Over 10 meeters:Tandem walk with turn around on airex pad ( distance of // bars)  X 10 laps  CGA-min a NMR Exhibits step strategy, challenging  Ambulating with vertical and horizontal head turns over 10 meters 2x for each CGA NMR Firm surface and over compliant surface  Ambulating with diagonal head turns over 10 meters 2x for each CGA NMR Firm surface    Pt educated throughout session about proper posture and technique with exercises. Improved exercise technique, movement at target joints, use of target muscles after min to mod verbal, visual, tactile cues.        PT Education - 12/29/21 1437     Education Details exercise technique, body mechanics    Person(s) Educated Patient    Methods Explanation;Demonstration;Verbal cues;Tactile cues    Comprehension Verbalized understanding;Returned demonstration;Verbal cues required;Need further instruction              PT Short Term Goals - 12/08/21 2117       PT SHORT TERM GOAL #1   Title Patient will be adherent with HEP at least 3x a week to improve  balance/strength and mobility;    Baseline 4/25: doesn't have a HEP    Time 4    Period Weeks    Status New    Target Date 01/05/22      PT SHORT TERM GOAL #2   Title Patient will be able to transfer sit<>Stand without pushing on arm rest with normal base of support, reporting minimal difficulty to improve transfer ability with ADLs.    Baseline 4/25: requires wide base of support and reports moderate difficulty;    Time 4    Period Weeks    Status New    Target Date 01/05/22               PT Long Term Goals - 12/08/21 2119       PT LONG TERM  GOAL #1   Title Patient will improve 10 meter walk test to >1.2 m/s without AD to return to community ambulator gait speed and reduce fall risk.    Baseline 4/25: 1.13 m/s    Time 8    Period Weeks    Status New    Target Date 02/02/22      PT LONG TERM GOAL #2   Title Patient will improve functional gait assessment to >22/30 to indicate low risk for falls;    Baseline 4/25: 20/30    Time 8    Period Weeks    Status New    Target Date 02/02/22      PT LONG TERM GOAL #3   Title Patient will improve FOTO score by 5% to indicate improved functional mobility with ADLs.    Baseline 4/25: 57%    Time 8    Period Weeks    Status New    Target Date 02/02/22      PT LONG TERM GOAL #4   Title Patient will be able to hold SLS for >5 sec each LE to indicate improved functional strength for negotiating obstacles and reduce fall risk.    Baseline 4/25: R: 5 sec, L 1 sec    Time 8    Period Weeks    Status New    Target Date 02/02/22                   Plan - 12/30/21 0819     Clinical Impression Statement Session limited secondary to pt late arrival, however, pt motivated to participate in PT. Pt requires CGA-min a throughout, where majority of interventions she performed with only mild unsteadiness, and was able to self-correct balance. Pt most challenged with Korebalance anticipatory postural control and weightshifting  interventions. She had difficulty calibrating correct amplitude of weight-shifts, and demonstrated decreased accuracy of meeting targets unless performing intervention at decreased speed. The pt will benefit from further skilled PT to improve LE strength, cooridnation and balance.    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, GERD, insomnia(sleeps 6 hours a night), Depression (Neurologist wants her to see MD to have medications re-evaluated); tremors, Hypothyroidism; LLE knee replacement (3 years ago)    Examination-Activity Limitations Locomotion Level;Stairs;Stand    Examination-Participation Restrictions Cleaning;Community Activity;Shop;Volunteer;Yard Work    Stability/Clinical Decision Making Evolving/Moderate complexity    Rehab Potential Good    PT Frequency 2x / week    PT Duration 8 weeks    PT Treatment/Interventions Cryotherapy;Electrical Stimulation;Moist Heat;Gait training;Stair training;Functional mobility training;Therapeutic activities;Therapeutic exercise;Balance training;Neuromuscular re-education;Patient/family education;Energy conservation    PT Next Visit Plan initiate HEP    PT Home Exercise Plan will address next session;    Consulted and Agree with Plan of Care Patient             Patient will benefit from skilled therapeutic intervention in order to improve the following deficits and impairments:  Abnormal gait, Decreased balance, Decreased endurance, Decreased mobility, Difficulty walking, Decreased activity tolerance  Visit Diagnosis: Unsteadiness on feet  Other lack of coordination     Problem List Patient Active Problem List   Diagnosis Date Noted   Hepatitis C 08/19/2016   Depression 08/19/2016   Insomnia 08/19/2016   Hypothyroidism 08/19/2016    Zollie Pee, PT 12/30/2021, 8:34 AM  [place] [address] [place], [place],  [address] Phone: [phone]   Fax:  [phone]  Name: YANAI HOBSON MRN: [id number] Date of Birth: November 09, 1955

## 2022-01-04 ENCOUNTER — Ambulatory Visit: Payer: Medicare Other | Admitting: Physical Therapy

## 2022-01-06 ENCOUNTER — Ambulatory Visit: Payer: Medicare Other | Admitting: Physical Therapy

## 2022-01-06 ENCOUNTER — Encounter: Payer: Self-pay | Admitting: Physical Therapy

## 2022-01-06 DIAGNOSIS — R278 Other lack of coordination: Secondary | ICD-10-CM

## 2022-01-06 DIAGNOSIS — R2689 Other abnormalities of gait and mobility: Secondary | ICD-10-CM

## 2022-01-06 DIAGNOSIS — R2681 Unsteadiness on feet: Secondary | ICD-10-CM

## 2022-01-06 NOTE — Therapy (Signed)
[place] MAIN [facility] SERVICES [address] [place], [place], [address] Phone: [phone]   Fax:  [phone]  Physical Therapy Treatment  Patient Details  Name: [name] [name] MRN: [id number] Date of Birth: 17-Sep-1955 Referring Provider (PT): Dr. Manuella Ghazi   Encounter Date: 01/06/2022   PT End of Session - 01/06/22 0936     Visit Number 6    Number of Visits 17    Date for PT Re-Evaluation 02/02/22    Authorization Type Medicare    Authorization Time Period start of care 12/07/21    PT Start Time 0932    PT Stop Time 1015    PT Time Calculation (min) 43 min    Equipment Utilized During Treatment Gait belt    Activity Tolerance Patient tolerated treatment well;No increased pain    Behavior During Therapy WFL for tasks assessed/performed             Past Medical History:  Diagnosis Date   Actinic keratosis    Acute hepatitis C without mention of hepatic coma(070.51)    Arthritis    fingers   Carpal tunnel syndrome of left wrist    Depression    GERD (gastroesophageal reflux disease)    Hypothyroidism    Insomnia    Insomnia    Memory loss    Osteoporosis    Squamous cell carcinoma of skin 12/27/2021   Left pretibial - EDC   Tremor    Wears dentures    full upper, partial lower    Past Surgical History:  Procedure Laterality Date   ANKLE SURGERY Right    BLADDER TACT     CATARACT EXTRACTION W/PHACO Left 10/13/2021   Procedure: CATARACT EXTRACTION PHACO AND INTRAOCULAR LENS PLACEMENT ([place]) LEFT;  Surgeon: Leandrew Koyanagi, MD;  Location: [place];  Service: Ophthalmology;  Laterality: Left;  3.97 00:49.0   CATARACT EXTRACTION W/PHACO Right 10/27/2021   Procedure: CATARACT EXTRACTION PHACO AND INTRAOCULAR LENS PLACEMENT ([place]) RIGHT 4.50 00:58.9;  Surgeon: Leandrew Koyanagi, MD;  Location: [place];  Service: Ophthalmology;  Laterality: Right;   HAND SURGERY Right    TONSILLECTOMY      There were  no vitals filed for this visit.   Subjective Assessment - 01/06/22 0934     Subjective Pt reports doing pretty good. She has been planting tomatoes. She reports she has been able to steady herself while in the garden. She denies any pain; She reports her exercises at home are going well.    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 She reports having trouble with balance and tremors. She reports falling often. She presents to therapy without AD. She has 3 walkers (apartment is too tiny and so she furniture walks). 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 Neurologist did prescribe Carvidopa/levadopa, did have side effects of nausea/vomitting. She has stopped taking medication,  but is hoping to see Neurologist again to see if she can get another medication to help control side effects. 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 NA    How  long can you stand comfortably? 10 min- legs start to ache    How long can you walk comfortably? about 500 feet, occasionally LE pain    Diagnostic tests MRI/CT scan negative for Parkinson's disease;    Patient Stated Goals "Stop falling, be able to walk farther, improve balance."    Currently in Pain? No/denies    Pain Score 0-No pain    Multiple Pain Sites No               Patient instructed in advanced balance exercise  Standing beside support bar:   Standing on airex foam: -Standing feet together eyes open x60 sec, minimal sway, eyes closed 15 sec with increased sway but able to  keep balance, CGA for safety;  -alternate toe taps to 4 inch step with 0 rail assist x15 reps bilaterally; -Standing one foot on airex, one foot on 8 inch step (2nd step)  Unsupported standing 30 sec hold x1 rep each foot on step  BUE ball pass side/side x5 reps each foot on step -Heel/toe raises x15 reps with rail assist for balance -mini squat unsupported x10 reps with cues for reaching forward for better stance control; -modified tandem stance: head turns side/side x5 reps each foot in front; Patient required min VCs for balance stability, including to increase trunk control for less loss of balance with smaller base of support  Standing on 1/2 foam: (round side up) -Heel raises x15 reps -toe raises x15 reps Required intermittent rail assist for safety control; required cues to isolate ankle ROM for strengthening Standing on 1/2 foam (flat side up) -tandem stance with 1-0 rail assist 10 sec hold x2 reps each foot in front   Pt requires CGA to min A for safety and cues to improve erect posture and increase weight shift for better stance control   Weaving around cones #5 x4 laps with close supervision for safety; required min VCs to increase step length and increase turn for better cone negotiation  Progressed to backward weaving around cones #5 x2-3 laps with cues for sequencing and positioning. Initially patient tried side stepping around cones but was able to progress to turning and backward walking with CGA for safety;   Side stepping over cones #5 x1 lap each direction with close supervision for safety and min VCs to increase step length and increase hip flexion for better foot clearance    Gait outside in hallway: Forward walking with head turns , side/side x80 feet x2 laps Forward/backward walking with ball toss and catch x80 feet Forward/backward walking  with head turns side/side x80 feet Side stepping with ball toss to/from therapist x80 feet x1 lap each direction;  Patient  required mod VCs to increase step length and improve gait speed especially with dual task such as toss/catch ball. Required cues to improve gaze stabilization with head turns to reduce dizziness and reduce veering side/side.    Tolerated session well. She does report moderate challenge with most exercise. Denies any increase in pain or soreness.                      PT Education - 01/06/22 0935     Education Details exercise technique/positioning;    Person(s) Educated Patient    Methods Explanation;Verbal cues    Comprehension Verbalized understanding;Returned demonstration;Verbal cues required;Need further instruction              PT Short Term Goals - 12/08/21 2117  PT SHORT TERM GOAL #1   Title Patient will be adherent with HEP at least 3x a week to improve balance/strength and mobility;    Baseline 4/25: doesn't have a HEP    Time 4    Period Weeks    Status New    Target Date 01/05/22      PT SHORT TERM GOAL #2   Title Patient will be able to transfer sit<>Stand without pushing on arm rest with normal base of support, reporting minimal difficulty to improve transfer ability with ADLs.    Baseline 4/25: requires wide base of support and reports moderate difficulty;    Time 4    Period Weeks    Status New    Target Date 01/05/22               PT Long Term Goals - 12/08/21 2119       PT LONG TERM GOAL #1   Title Patient will improve 10 meter walk test to >1.2 m/s without AD to return to community ambulator gait speed and reduce fall risk.    Baseline 4/25: 1.13 m/s    Time 8    Period Weeks    Status New    Target Date 02/02/22      PT LONG TERM GOAL #2   Title Patient will improve functional gait assessment to >22/30 to indicate low risk for falls;    Baseline 4/25: 20/30    Time 8    Period Weeks    Status New    Target Date 02/02/22      PT LONG TERM GOAL #3   Title Patient will improve FOTO score by 5% to indicate  improved functional mobility with ADLs.    Baseline 4/25: 57%    Time 8    Period Weeks    Status New    Target Date 02/02/22      PT LONG TERM GOAL #4   Title Patient will be able to hold SLS for >5 sec each LE to indicate improved functional strength for negotiating obstacles and reduce fall risk.    Baseline 4/25: R: 5 sec, L 1 sec    Time 8    Period Weeks    Status New    Target Date 02/02/22                   Plan - 01/06/22 0932     Clinical Impression Statement Patient motivated and participated well within session. She was instructed in advanced balance tasks. patient is challenged with airex pad with decreased ankle stability especially with narrow base of support. She was able to progress with less rail assist but does require CGA to min A for safety. Pt was challenged with dynamic gait with various walking tasks. 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, GERD, insomnia(sleeps 6 hours a night), Depression (Neurologist wants her to see MD to have medications re-evaluated); tremors, Hypothyroidism; LLE knee replacement (3 years ago)    Examination-Activity Limitations Locomotion Level;Stairs;Stand    Examination-Participation Restrictions Cleaning;Community Activity;Shop;Volunteer;Yard Work    Stability/Clinical Decision Making Evolving/Moderate complexity    Rehab Potential Good    PT Frequency 2x / week    PT Duration 8 weeks    PT Treatment/Interventions Cryotherapy;Dealer  Stimulation;Moist Heat;Gait training;Stair training;Functional mobility training;Therapeutic activities;Therapeutic exercise;Balance training;Neuromuscular re-education;Patient/family education;Energy conservation    PT Next  Visit Plan initiate HEP    PT Home Exercise Plan will address next session;    Consulted and Agree with Plan of Care Patient             Patient will benefit from skilled therapeutic intervention in order to improve the following deficits and impairments:  Abnormal gait, Decreased balance, Decreased endurance, Decreased mobility, Difficulty walking, Decreased activity tolerance  Visit Diagnosis: Unsteadiness on feet  Other lack of coordination  Other abnormalities of gait and mobility     Problem List Patient Active Problem List   Diagnosis Date Noted   Hepatitis C 08/19/2016   Depression 08/19/2016   Insomnia 08/19/2016   Hypothyroidism 08/19/2016    Ruqaya Strauss, PT, DPT 01/06/2022, 10:13 AM  [place], [place], [address] Phone: [phone]   Fax:  [phone]  Name: DALEY MOORADIAN MRN: [id number] Date of Birth: Jan 01, 1956

## 2022-01-07 ENCOUNTER — Encounter: Payer: Self-pay | Admitting: Dermatology

## 2022-01-12 ENCOUNTER — Ambulatory Visit: Payer: Medicare Other

## 2022-01-13 ENCOUNTER — Ambulatory Visit: Payer: Medicare Other | Attending: Neurology | Admitting: Physical Therapy

## 2022-01-13 DIAGNOSIS — R2689 Other abnormalities of gait and mobility: Secondary | ICD-10-CM | POA: Insufficient documentation

## 2022-01-13 DIAGNOSIS — R2681 Unsteadiness on feet: Secondary | ICD-10-CM | POA: Insufficient documentation

## 2022-01-13 DIAGNOSIS — M6281 Muscle weakness (generalized): Secondary | ICD-10-CM | POA: Insufficient documentation

## 2022-01-13 DIAGNOSIS — R278 Other lack of coordination: Secondary | ICD-10-CM | POA: Insufficient documentation

## 2022-01-13 NOTE — Therapy (Signed)
[place] [facility] SERVICES [address] [place], [place], [address] Phone: [phone]   Fax:  [phone]  Physical Therapy Treatment  Patient Details  Name: [name] [name] MRN: [id number] Date of Birth: 1956/06/20 Referring Provider (PT): Dr. Manuella Ghazi   Encounter Date: 01/13/2022   PT End of Session - 01/13/22 0930     Visit Number 7    Number of Visits 17    Date for PT Re-Evaluation 02/02/22    Authorization Type Medicare    Authorization Time Period start of care 12/07/21    Progress Note Due on Visit 10    PT Start Time 0845    PT Stop Time 0930    PT Time Calculation (min) 45 min    Equipment Utilized During Treatment Gait belt    Activity Tolerance Patient tolerated treatment well;No increased pain    Behavior During Therapy WFL for tasks assessed/performed             Past Medical History:  Diagnosis Date   Actinic keratosis    Acute hepatitis C without mention of hepatic coma(070.51)    Arthritis    fingers   Carpal tunnel syndrome of left wrist    Depression    GERD (gastroesophageal reflux disease)    Hypothyroidism    Insomnia    Insomnia    Memory loss    Osteoporosis    Squamous cell carcinoma of skin 12/27/2021   Left pretibial - [facility]   Tremor    Wears dentures    full upper, partial lower    Past Surgical History:  Procedure Laterality Date   ANKLE SURGERY Right    BLADDER TACT     CATARACT EXTRACTION W/PHACO Left 10/13/2021   Procedure: CATARACT EXTRACTION PHACO AND INTRAOCULAR LENS PLACEMENT ([place]) LEFT;  Surgeon: Leandrew Koyanagi, MD;  Location: [place];  Service: Ophthalmology;  Laterality: Left;  3.97 00:49.0   CATARACT EXTRACTION W/PHACO Right 10/27/2021   Procedure: CATARACT EXTRACTION PHACO AND INTRAOCULAR LENS PLACEMENT ([place]) RIGHT 4.50 00:58.9;  Surgeon: Leandrew Koyanagi, MD;  Location: [place];  Service: Ophthalmology;  Laterality: Right;   HAND SURGERY Right     TONSILLECTOMY      There were no vitals filed for this visit.   Subjective Assessment - 01/13/22 0847     Subjective Pt reports doing pretty okay but her garden keeps getting dug up by some kind of creature. Pt reports no falls or LOB since last visit.    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 She reports having trouble with balance and tremors. She reports falling often. She presents to therapy without AD. She has 3 walkers (apartment is too tiny and so she furniture walks). 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 Neurologist did prescribe Carvidopa/levadopa, did have side effects of nausea/vomitting. She has stopped taking medication,  but is hoping to see Neurologist again to see if she can get another medication to help control side effects. 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 NA    How long  can you stand comfortably? 10 min- legs start to ache    How long can you walk comfortably? about 500 feet, occasionally LE pain    Diagnostic tests MRI/CT scan negative for Parkinson's disease;    Patient Stated Goals "Stop falling, be able to walk farther, improve balance."    Currently in Pain? No/denies             Exercise/Activity Sets/Reps/Time/ Resistance Assistance Charge type Comments- Unless otherwise stated, CGA was provided and gait belt donned in order to ensure pt safety  Airex then balance beam then airex side stepping  2 x 2 laps  Cga  NMR  Cues for slow and deliberate movement.  Difficulty with  transitions to and from Airex pad with sidestepping but less difficulty noted with normal walking stepping on and off Airex pad.  Sidestepping from airex to over 3 x 1/2 bolster to airex X 4 laps   NMR Cues for large weight shift and side step   On airex tapping hedgehogs (4) 2 x 1 minute   NMR  Increased difficulty with this activity.   Cable columns side stepping  7.5# each direction 3 laps ea  CGA  NMR Increased difficulty stepping to right but improved with practice         Tandem walk with turn around on airex pad ( distance of // bars)  X 6 laps   NMR  Cannot complete tandem walk consistently without UE support. Good transitions on airex pad.   Star tapping to hedgehogs standing on airex  X 5 hedgehogs on star in floor with airex located on 1/2 of star  CGA NMR Cues for foot placement and control.                           Treatment Provided this session   Rationale for Evaluation and Treatment Rehabilitation  Pt educated throughout session about proper posture and technique with exercises. Improved exercise technique, movement at target joints, use of target muscles after min to mod verbal, visual, tactile cues.  Note: Portions of this document were prepared using Dragon voice recognition software and although reviewed may contain unintentional dictation errors in syntax, grammar, or spelling.                               PT Education - 01/13/22 1056     Education Details exercise technique    Person(s) Educated Patient    Methods Explanation    Comprehension Verbalized understanding              PT Short Term Goals - 12/08/21 2117       PT SHORT TERM GOAL #1   Title Patient will be adherent with HEP at least 3x a week to improve balance/strength and mobility;    Baseline 4/25: doesn't have a HEP    Time 4    Period Weeks    Status New    Target Date 01/05/22      PT SHORT TERM GOAL #2   Title Patient will be able to transfer sit<>Stand  without pushing on arm rest with normal base of support, reporting minimal difficulty to improve transfer ability with ADLs.    Baseline 4/25: requires wide base of support and reports moderate difficulty;    Time 4    Period Weeks    Status New    Target Date 01/05/22  PT Long Term Goals - 12/08/21 2119       PT LONG TERM GOAL #1   Title Patient will improve 10 meter walk test to >1.2 m/s without AD to return to community ambulator gait speed and reduce fall risk.    Baseline 4/25: 1.13 m/s    Time 8    Period Weeks    Status New    Target Date 02/02/22      PT LONG TERM GOAL #2   Title Patient will improve functional gait assessment to >22/30 to indicate low risk for falls;    Baseline 4/25: 20/30    Time 8    Period Weeks    Status New    Target Date 02/02/22      PT LONG TERM GOAL #3   Title Patient will improve FOTO score by 5% to indicate improved functional mobility with ADLs.    Baseline 4/25: 57%    Time 8    Period Weeks    Status New    Target Date 02/02/22      PT LONG TERM GOAL #4   Title Patient will be able to hold SLS for >5 sec each LE to indicate improved functional strength for negotiating obstacles and reduce fall risk.    Baseline 4/25: R: 5 sec, L 1 sec    Time 8    Period Weeks    Status New    Target Date 02/02/22                   Plan - 01/13/22 1056     Clinical Impression Statement Patient motivated and will dissipate as well with all physical therapy activities this session.  Patient was progressed in advance balance and coordination tasks at and demonstrate some difficulty with sidestepping particularly with right lower extremity as primary support lower extremity with all activities patient did improve with practice and did not demonstrate any loss of balance she was not able to recover either with upper extremity support or with stepping strategies.  Patient will continue to benefit from skilled physical  therapy intervention in order to improve her strength, balance, mobility, and quality of life.    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, GERD, insomnia(sleeps 6 hours a night), Depression (Neurologist wants her to see MD to have medications re-evaluated); tremors, Hypothyroidism; LLE knee replacement (3 years ago)    Examination-Activity Limitations Locomotion Level;Stairs;Stand    Examination-Participation Restrictions Cleaning;Community Activity;Shop;Volunteer;Yard Work    Stability/Clinical Decision Making Evolving/Moderate complexity    Rehab Potential Good    PT Frequency 2x / week    PT Duration 8 weeks    PT Treatment/Interventions Cryotherapy;Electrical Stimulation;Moist Heat;Gait training;Stair training;Functional mobility training;Therapeutic activities;Therapeutic exercise;Balance training;Neuromuscular re-education;Patient/family education;Energy conservation    PT Next Visit Plan initiate HEP    PT Home Exercise Plan will address next session;    Consulted and Agree with Plan of Care Patient             Patient will benefit from skilled therapeutic intervention in order to improve the following deficits and impairments:  Abnormal gait, Decreased balance, Decreased endurance, Decreased mobility, Difficulty walking, Decreased activity tolerance  Visit Diagnosis: Unsteadiness on feet  Other lack of coordination  Other abnormalities of gait and mobility     Problem List Patient Active Problem List   Diagnosis Date Noted   Hepatitis C 08/19/2016   Depression 08/19/2016   Insomnia 08/19/2016   Hypothyroidism  08/19/2016    Particia Lather, PT 01/13/2022, 10:58 AM  [place] [facility] SERVICES [address] [place], [place], [address] Phone: [phone]   Fax:  [phone]  Name: JANVI AMMAR MRN: [id number] Date of Birth: 04/06/1956

## 2022-01-17 ENCOUNTER — Ambulatory Visit: Payer: Medicare Other | Admitting: Physical Therapy

## 2022-01-17 ENCOUNTER — Telehealth: Payer: Self-pay

## 2022-01-17 NOTE — Telephone Encounter (Signed)
Pt did not show for scheduled PT appointment. Pt did not contact clinic. Author called pt at listed number, left a HIPAA friendly VM.    10:46 AM, 01/17/22 Etta Grandchild, PT, DPT Physical Therapist - [place] [phone]

## 2022-01-19 ENCOUNTER — Ambulatory Visit: Payer: Medicare Other

## 2022-01-19 ENCOUNTER — Telehealth: Payer: Self-pay

## 2022-01-19 NOTE — Telephone Encounter (Signed)
PT contacted pt over secure phone line as pt missed today's appointment. Reminded pt of next appointment date/time. Pt reported she is going to come by clinic later today to update her schedule. Ricard Dillon PT, DPT

## 2022-01-24 ENCOUNTER — Ambulatory Visit: Payer: Medicare Other | Admitting: Physical Therapy

## 2022-01-24 ENCOUNTER — Encounter: Payer: Self-pay | Admitting: Physical Therapy

## 2022-01-24 DIAGNOSIS — R278 Other lack of coordination: Secondary | ICD-10-CM

## 2022-01-24 DIAGNOSIS — R2681 Unsteadiness on feet: Secondary | ICD-10-CM

## 2022-01-24 DIAGNOSIS — R2689 Other abnormalities of gait and mobility: Secondary | ICD-10-CM

## 2022-01-24 NOTE — Therapy (Signed)
[place] [facility] SERVICES [address] [place], [place], [address] Phone: [phone]   Fax:  [phone]  Physical Therapy Treatment  Patient Details  Name: [name] [name] MRN: [id number] Date of Birth: [birth year] Referring Provider (PT): Dr. Manuella Ghazi   Encounter Date: 01/24/2022   PT End of Session - 01/24/22 1030     Visit Number 8    Number of Visits 17    Date for PT Re-Evaluation 02/02/22    Authorization Type Medicare    Authorization Time Period start of care 12/07/21    Progress Note Due on Visit 10    PT Start Time 1019    PT Stop Time 1059    PT Time Calculation (min) 40 min    Equipment Utilized During Treatment Gait belt    Activity Tolerance Patient tolerated treatment well;No increased pain    Behavior During Therapy WFL for tasks assessed/performed             Past Medical History:  Diagnosis Date   Actinic keratosis    Acute hepatitis C without mention of hepatic coma(070.51)    Arthritis    fingers   Carpal tunnel syndrome of left wrist    Depression    GERD (gastroesophageal reflux disease)    Hypothyroidism    Insomnia    Insomnia    Memory loss    Osteoporosis    Squamous cell carcinoma of skin 12/27/2021   Left pretibial - [facility]   Tremor    Wears dentures    full upper, partial lower    Past Surgical History:  Procedure Laterality Date   ANKLE SURGERY Right    BLADDER TACT     CATARACT EXTRACTION W/PHACO Left 10/13/2021   Procedure: CATARACT EXTRACTION PHACO AND INTRAOCULAR LENS PLACEMENT ([place]) LEFT;  Surgeon: Leandrew Koyanagi, MD;  Location: [place];  Service: Ophthalmology;  Laterality: Left;  3.97 00:49.0   CATARACT EXTRACTION W/PHACO Right 10/27/2021   Procedure: CATARACT EXTRACTION PHACO AND INTRAOCULAR LENS PLACEMENT ([place]) RIGHT 4.50 00:58.9;  Surgeon: Leandrew Koyanagi, MD;  Location: [place];  Service: Ophthalmology;  Laterality: Right;   HAND SURGERY Right     TONSILLECTOMY      There were no vitals filed for this visit.   Subjective Assessment - 01/24/22 1023     Subjective Pt reports blance is continuing to ipmrove but still feels some weakness in her arms and legs limiting her ability to complete household chores.    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 She reports having trouble with balance and tremors. She reports falling often. She presents to therapy without AD. She has 3 walkers (apartment is too tiny and so she furniture walks). 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 Neurologist did prescribe Carvidopa/levadopa, did have side effects of nausea/vomitting. She has stopped taking medication,  but is hoping to see Neurologist again to see if she can get another medication to help control side effects. 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 NA    How long can you  stand comfortably? 10 min- legs start to ache    How long can you walk comfortably? about 500 feet, occasionally LE pain    Diagnostic tests MRI/CT scan negative for Parkinson's disease;    Patient Stated Goals "Stop falling, be able to walk farther, improve balance."    Currently in Pain? No/denies    Pain Score 0-No pain                Exercise/Activity Sets/Reps/Time/ Resistance Assistance Charge type Comments- Unless otherwise stated, CGA was provided and gait belt donned in order to ensure pt safety  SLS practice  X 30 second trials x 2 on ea side  Cga  NMR  Unable to maintain on either side  longer than 5 seconds at a time.         STS on airex pad with ball   2 x 10 with 3KG weighted ball  SBA  NMR  Increased difficulty with this activity.   Cable columnsforward, side and retro stepping  12.5 # each direction 3 laps ea  CGA  NMR Increased difficulty stepping to right but overall medium difficulty   Leg press  2*10 @ 55# X 8@ 70#  Therex  To improve LE strength                                       Treatment Provided this session   Rationale for Evaluation and Treatment Rehabilitation  Pt educated throughout session about proper posture and technique with exercises. Improved exercise technique, movement at target joints, use of target muscles after min to mod verbal, visual, tactile cues.  Note: Portions of this document were prepared using Dragon voice recognition software and although reviewed may contain unintentional dictation errors in syntax, grammar, or spelling.                         PT Education - 01/24/22 1126     Education Details exercise technique    Person(s) Educated Patient    Methods Explanation    Comprehension Verbalized understanding              PT Short Term Goals - 12/08/21 2117       PT SHORT TERM GOAL #1   Title Patient will be adherent with HEP at least 3x a week to improve balance/strength and mobility;    Baseline 4/25: doesn't have a HEP    Time 4    Period Weeks    Status New    Target Date 01/05/22      PT SHORT TERM GOAL #2   Title Patient will be able to transfer sit<>Stand without pushing on arm rest with normal base of support, reporting minimal difficulty to improve transfer ability with ADLs.    Baseline 4/25: requires wide base of support and reports moderate difficulty;    Time 4    Period Weeks    Status New    Target Date 01/05/22               PT Long Term Goals - 12/08/21 2119       PT LONG TERM GOAL #1   Title Patient will improve 10 meter walk test to >1.2 m/s without AD to  return to community ambulator gait speed and reduce fall risk.    Baseline 4/25: 1.13 m/s    Time 8  Period Weeks    Status New    Target Date 02/02/22      PT LONG TERM GOAL #2   Title Patient will improve functional gait assessment to >22/30 to indicate low risk for falls;    Baseline 4/25: 20/30    Time 8    Period Weeks    Status New    Target Date 02/02/22      PT LONG TERM GOAL #3   Title Patient will improve FOTO score by 5% to indicate improved functional mobility with ADLs.    Baseline 4/25: 57%    Time 8    Period Weeks    Status New    Target Date 02/02/22      PT LONG TERM GOAL #4   Title Patient will be able to hold SLS for >5 sec each LE to indicate improved functional strength for negotiating obstacles and reduce fall risk.    Baseline 4/25: R: 5 sec, L 1 sec    Time 8    Period Weeks    Status New    Target Date 02/02/22                   Plan - 01/24/22 1030     Clinical Impression Statement Patient motivated and participates well within physical therapy session this date.  Patient continues to progress with balance interventions but still has difficulty with single-leg balance bilaterally.  Patient reports her balance and function continues to improve but she still has some issues with strength as far as lifting objects for her activities around her home.  Patient was started with advanced lower extremity strengthening in addition to advance lower extremity strength and balance activities with good response this session.  Patient will continue to benefit from skilled physical therapy in order to improve her lower extremity strength, endurance, balance, and quality of life.    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, GERD, insomnia(sleeps 6 hours a night), Depression (Neurologist wants her to see MD to have medications re-evaluated); tremors, Hypothyroidism; LLE knee replacement (3 years ago)     Examination-Activity Limitations Locomotion Level;Stairs;Stand    Examination-Participation Restrictions Cleaning;Community Activity;Shop;Volunteer;Yard Work    Stability/Clinical Decision Making Evolving/Moderate complexity    Rehab Potential Good    PT Frequency 2x / week    PT Duration 8 weeks    PT Treatment/Interventions Cryotherapy;Electrical Stimulation;Moist Heat;Gait training;Stair training;Functional mobility training;Therapeutic activities;Therapeutic exercise;Balance training;Neuromuscular re-education;Patient/family education;Energy conservation    PT Next Visit Plan initiate HEP    PT Home Exercise Plan will address next session;    Consulted and Agree with Plan of Care Patient             Patient will benefit from skilled therapeutic intervention in order to improve the following deficits and impairments:  Abnormal gait, Decreased balance, Decreased endurance, Decreased mobility, Difficulty walking, Decreased activity tolerance  Visit Diagnosis: Unsteadiness on feet  Other lack of coordination  Other abnormalities of gait and mobility     Problem List Patient Active Problem List   Diagnosis Date Noted   Hepatitis C 08/19/2016   Depression 08/19/2016   Insomnia 08/19/2016   Hypothyroidism 08/19/2016    Particia Lather, PT 01/24/2022, 11:27 AM  [place] [address] [place], [place], [address] Phone: [phone]   Fax:  [phone]  Name: LATIKA KRONICK MRN: [id number] Date of Birth: Apr 25, 1956

## 2022-01-26 ENCOUNTER — Ambulatory Visit: Payer: Medicare Other

## 2022-01-26 DIAGNOSIS — M6281 Muscle weakness (generalized): Secondary | ICD-10-CM

## 2022-01-26 DIAGNOSIS — R2681 Unsteadiness on feet: Secondary | ICD-10-CM | POA: Diagnosis not present

## 2022-01-26 NOTE — Therapy (Signed)
[place] MAIN [facility] SERVICES [address] [place], [place], [address] Phone: [phone]   Fax:  [phone]  Physical Therapy Treatment  Patient Details  Name: [name] [name] MRN: [id number] Date of Birth: 05-Aug-1956 Referring Provider (PT): Dr. Manuella Ghazi   Encounter Date: 01/26/2022   PT End of Session - 01/26/22 1501     Visit Number 9    Number of Visits 17    Date for PT Re-Evaluation 02/02/22    Authorization Type Medicare    Authorization Time Period start of care 12/07/21    Progress Note Due on Visit 10    PT Start Time 1102    PT Stop Time 1145    PT Time Calculation (min) 43 min    Equipment Utilized During Treatment Gait belt    Activity Tolerance Patient tolerated treatment well;No increased pain    Behavior During Therapy WFL for tasks assessed/performed             Past Medical History:  Diagnosis Date   Actinic keratosis    Acute hepatitis C without mention of hepatic coma(070.51)    Arthritis    fingers   Carpal tunnel syndrome of left wrist    Depression    GERD (gastroesophageal reflux disease)    Hypothyroidism    Insomnia    Insomnia    Memory loss    Osteoporosis    Squamous cell carcinoma of skin 12/27/2021   Left pretibial - EDC   Tremor    Wears dentures    full upper, partial lower    Past Surgical History:  Procedure Laterality Date   ANKLE SURGERY Right    BLADDER TACT     CATARACT EXTRACTION W/PHACO Left 10/13/2021   Procedure: CATARACT EXTRACTION PHACO AND INTRAOCULAR LENS PLACEMENT ([place]) LEFT;  Surgeon: Leandrew Koyanagi, MD;  Location: [place];  Service: Ophthalmology;  Laterality: Left;  3.97 00:49.0   CATARACT EXTRACTION W/PHACO Right 10/27/2021   Procedure: CATARACT EXTRACTION PHACO AND INTRAOCULAR LENS PLACEMENT ([place]) RIGHT 4.50 00:58.9;  Surgeon: Leandrew Koyanagi, MD;  Location: [place];  Service: Ophthalmology;  Laterality: Right;   HAND SURGERY Right     TONSILLECTOMY      There were no vitals filed for this visit.   Subjective Assessment - 01/26/22 1101     Subjective Pt reports no pain currently, no stumbles/falls. No other changes reported.    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 She reports having trouble with balance and tremors. She reports falling often. She presents to therapy without AD. She has 3 walkers (apartment is too tiny and so she furniture walks). 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 Neurologist did prescribe Carvidopa/levadopa, did have side effects of nausea/vomitting. She has stopped taking medication,  but is hoping to see Neurologist again to see if she can get another medication to help control side effects. 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 NA    How long can you stand comfortably? 10 min- legs start to ache    How long  can you walk comfortably? about 500 feet, occasionally LE pain    Diagnostic tests MRI/CT scan negative for Parkinson's disease;    Patient Stated Goals "Stop falling, be able to walk farther, improve balance."    Currently in Pain? No/denies             INTERVENTIONS     Exercise/Activity Sets/Reps/Time/ Resistance Assistance Charge type Comments- Unless otherwise stated, CGA was provided and gait belt donned in order to ensure pt safety  SLB progression one foot airex, one foot 6" step 2x30 sec each LE CGA NMR Rates moderate  SLB progression one foot airex, one foot 6" step Progressed to performing with  head turns (vertical and horizontal) 10x for each head turn, performed each LE CGA NMR Pt rates challenging  Slow marching on airex as SLB progression 2x20 alt LE CGA NMR Difficulty with eccentric control, however, improves with reps  SLS practice  4X 30 each LE Cga  NMR  Sustains 25 sec RLE, 6 sec LLE before requiring UE support to regain balance   Cone taps: 3 cones in front of pt, PT calling out sequence to tap  x multipe reps each LE  CGA  NMR  challenging, knocks over cones several times  STS on airex pad with ball   2 x 10 with 3KG weighted ball  CGA NMR  Pt reports interventions is hard  Cable machine resisted gait fwd/bckwd and lateral stepping 12.5 # each direction 5 laps ea  CGA  Therex Pt with slight decrease in balance with retro-stepping  Leg press  1*12 @ 55# 2X 12@ 70#   Therex  Pt rates as medium  Treatment Provided this session    Rationale for Evaluation and Treatment Rehabilitation   Pt educated throughout session about proper posture and technique with exercises. Improved exercise technique, movement at target joints, use of target muscles after min to mod verbal, visual, tactile cues.        PT Education - 01/26/22 1501     Education Details exercise technique, body mechanics    Person(s) Educated Patient    Methods Explanation;Demonstration;Verbal cues    Comprehension Verbalized understanding;Returned demonstration;Need further instruction              PT Short Term Goals - 12/08/21 2117       PT SHORT TERM GOAL #1   Title Patient will be adherent with HEP at least 3x a week to improve balance/strength and mobility;    Baseline 4/25: doesn't have a HEP    Time 4    Period Weeks    Status New    Target Date 01/05/22      PT SHORT TERM GOAL #2   Title Patient will be able to transfer sit<>Stand without pushing on arm rest with normal base of support, reporting minimal difficulty to improve transfer ability with ADLs.    Baseline 4/25: requires wide  base of support and reports moderate difficulty;    Time 4    Period Weeks    Status New    Target Date 01/05/22               PT Long Term Goals - 12/08/21 2119       PT LONG TERM GOAL #1   Title Patient will improve 10 meter walk test to >1.2 m/s without AD to return to community ambulator gait speed and reduce fall risk.    Baseline 4/25: 1.13 m/s    Time 8  Period Weeks    Status New    Target Date 02/02/22      PT LONG TERM GOAL #2   Title Patient will improve functional gait assessment to >22/30 to indicate low risk for falls;    Baseline 4/25: 20/30    Time 8    Period Weeks    Status New    Target Date 02/02/22      PT LONG TERM GOAL #3   Title Patient will improve FOTO score by 5% to indicate improved functional mobility with ADLs.    Baseline 4/25: 57%    Time 8    Period Weeks    Status New    Target Date 02/02/22      PT LONG TERM GOAL #4   Title Patient will be able to hold SLS for >5 sec each LE to indicate improved functional strength for negotiating obstacles and reduce fall risk.    Baseline 4/25: R: 5 sec, L 1 sec    Time 8    Period Weeks    Status New    Target Date 02/02/22                   Plan - 01/26/22 1530     Clinical Impression Statement Pt highly motivated to participate in session. Pt shows progress by sustaining SLB on RLE for 25 sec and 6 sec on LLE. Pt also able to increase strengthening interventions in volume and resistance used. While pt shows progress, she is still challenged with dynamic SLB tasks and with using head turns while standing on compliant surfaces. The pt will benefit from further skilled PT to improve strength, endurance, balance and mobility.    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, GERD, insomnia(sleeps 6 hours a night), Depression (Neurologist wants her to see MD to have medications re-evaluated); tremors, Hypothyroidism; LLE knee  replacement (3 years ago)    Examination-Activity Limitations Locomotion Level;Stairs;Stand    Examination-Participation Restrictions Cleaning;Community Activity;Shop;Volunteer;Yard Work    Stability/Clinical Decision Making Evolving/Moderate complexity    Rehab Potential Good    PT Frequency 2x / week    PT Duration 8 weeks    PT Treatment/Interventions Cryotherapy;Electrical Stimulation;Moist Heat;Gait training;Stair training;Functional mobility training;Therapeutic activities;Therapeutic exercise;Balance training;Neuromuscular re-education;Patient/family education;Energy conservation    PT Next Visit Plan no updates    PT Home Exercise Plan balance, strength    Consulted and Agree with Plan of Care Patient             Patient will benefit from skilled therapeutic intervention in order to improve the following deficits and impairments:  Abnormal gait, Decreased balance, Decreased endurance, Decreased mobility, Difficulty walking, Decreased activity tolerance  Visit Diagnosis: Unsteadiness on feet  Muscle weakness (generalized)     Problem List Patient Active Problem List   Diagnosis Date Noted   Hepatitis C 08/19/2016   Depression 08/19/2016   Insomnia 08/19/2016   Hypothyroidism 08/19/2016    Zollie Pee, PT 01/26/2022, 5:09 PM  [place] [address] [place], [place], [address] Phone: [phone]   Fax:  [phone]  Name: [name] [name] MRN: [id number] Date of Birth: 10-25-1955

## 2022-01-31 ENCOUNTER — Ambulatory Visit: Payer: Medicare Other | Admitting: Physical Therapy

## 2022-02-02 ENCOUNTER — Encounter: Payer: Self-pay | Admitting: Physical Therapy

## 2022-02-02 ENCOUNTER — Ambulatory Visit: Payer: Medicare Other | Admitting: Physical Therapy

## 2022-02-02 DIAGNOSIS — M6281 Muscle weakness (generalized): Secondary | ICD-10-CM

## 2022-02-02 DIAGNOSIS — R2689 Other abnormalities of gait and mobility: Secondary | ICD-10-CM

## 2022-02-02 DIAGNOSIS — R2681 Unsteadiness on feet: Secondary | ICD-10-CM

## 2022-02-02 DIAGNOSIS — R278 Other lack of coordination: Secondary | ICD-10-CM

## 2022-02-02 NOTE — Therapy (Addendum)
OUTPATIENT PHYSICAL THERAPY TREATMENT NOTE Physical Therapy Progress Note   Dates of reporting period  12/07/21   to   02/02/22    Patient Name: [name] [name] MRN: [id number] DOB:Jan 30, 1956, 66 y.o., female Today's Date: 02/02/2022  PCP: Delight Stare MD REFERRING PROVIDER: Jennings Books, MD   PT End of Session - 02/02/22 0851     Visit Number 10    Number of Visits 25    Date for PT Re-Evaluation 03/02/22    Authorization Type Medicare/ Progress note 02/02/22/recert 4/49-6/75    Authorization Time Period start of care 12/07/21    Progress Note Due on Visit 10    PT Start Time 0850    PT Stop Time 0930    PT Time Calculation (min) 40 min    Equipment Utilized During Treatment Gait belt    Activity Tolerance Patient tolerated treatment well;No increased pain    Behavior During Therapy WFL for tasks assessed/performed             Past Medical History:  Diagnosis Date   Actinic keratosis    Acute hepatitis C without mention of hepatic coma(070.51)    Arthritis    fingers   Carpal tunnel syndrome of left wrist    Depression    GERD (gastroesophageal reflux disease)    Hypothyroidism    Insomnia    Insomnia    Memory loss    Osteoporosis    Squamous cell carcinoma of skin 12/27/2021   Left pretibial - EDC   Tremor    Wears dentures    full upper, partial lower   Past Surgical History:  Procedure Laterality Date   ANKLE SURGERY Right    BLADDER TACT     CATARACT EXTRACTION W/PHACO Left 10/13/2021   Procedure: CATARACT EXTRACTION PHACO AND INTRAOCULAR LENS PLACEMENT ([place]) LEFT;  Surgeon: Leandrew Koyanagi, MD;  Location: [facility];  Service: Ophthalmology;  Laterality: Left;  3.97 00:49.0   CATARACT EXTRACTION W/PHACO Right 10/27/2021   Procedure: CATARACT EXTRACTION PHACO AND INTRAOCULAR LENS PLACEMENT ([place]) RIGHT 4.50 00:58.9;  Surgeon: Leandrew Koyanagi, MD;  Location: [facility];  Service: Ophthalmology;  Laterality: Right;   HAND SURGERY  Right    TONSILLECTOMY     Patient Active Problem List   Diagnosis Date Noted   Hepatitis C 08/19/2016   Depression 08/19/2016   Insomnia 08/19/2016   Hypothyroidism 08/19/2016    REFERRING DIAG: Imbalance  THERAPY DIAG:  Unsteadiness on feet  Muscle weakness (generalized)  Other lack of coordination  Other abnormalities of gait and mobility  Rationale for Evaluation and Treatment Rehabilitation  PERTINENT HISTORY: 66 yo Female 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 She reports having trouble with balance and tremors. She reports falling often. She presents to therapy without AD. She has 3 walkers (apartment is too tiny and so she furniture walks). 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 Neurologist did prescribe Carvidopa/levadopa, did have side effects of nausea/vomitting. She has stopped taking medication,  but is hoping to see Neurologist again to see if she can get another medication to help control side effects. 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 She reports her balance has been pretty good. She denies any new falls. Reports adherence to HEP 3x in the last week;   PAIN:  Are you having pain? No     TODAY'S TREATMENT:  02/02/22 Instructed patient in LE stretches as warm up:  Seated: Figure 4 piriformis stretch 20 sec hold x1 rep each LE Hamstring stretch 20 sec hold x1 rep each LE Required min VCS for proper positioning/exercise technique;  Standing: alternate march x10 reps Hip  circles x5 reps- decreased abduction/ER noted but denies any pain;    PT assessed goals: She is able to transfer sit<>Stand without pushing on chair with good positioning and control;   [facility] PT Assessment - 02/02/22 0001       Observation/Other Assessments   Focus on Therapeutic Outcomes (FOTO)  56%, slight decrease from 57% on 12/08/21      Standardized Balance Assessment   10 Meter Walk 1.5 m/s without AD, improved from 1.13 m/s on 12/08/21; community ambulator, low risk for falls      High Level Balance   High Level Balance Comments SLS: R: 15+ sec, LLE: 2-3 sec inconsistent      Functional Gait  Assessment   Gait Level Surface Walks 20 ft in less than 5.5 sec, no assistive devices, good speed, no evidence for imbalance, normal gait pattern, deviates no more than 6 in outside of the 12 in walkway width.    Change in Gait Speed Able to smoothly change walking speed without loss of balance or gait deviation. Deviate no more than 6 in outside of the 12 in walkway width.    Gait with Horizontal Head Turns Performs head turns smoothly with no change in gait. Deviates no more than 6 in outside 12 in walkway width    Gait with Vertical Head Turns Performs head turns with no change in gait. Deviates no more than 6 in outside 12 in walkway width.    Gait and Pivot Turn Pivot turns safely within 3 sec and stops quickly with no loss of balance.    Step Over Obstacle Is able to step over 2 stacked shoe boxes taped together (9 in total height) without changing gait speed. No evidence of imbalance.    Gait with Narrow Base of Support Ambulates 4-7 steps.    Gait with Eyes Closed Walks 20 ft, uses assistive device, slower speed, mild gait deviations, deviates 6-10 in outside 12 in walkway width. Ambulates 20 ft in less than 9 sec but greater than 7 sec.    Ambulating Backwards Walks 20 ft, uses assistive device, slower speed, mild gait deviations, deviates 6-10 in outside 12 in walkway width.    Steps  Alternating feet, no rail.    Total Score 26    FGA comment: >22/30 indicates low risk for falls, improved from 20/30 on 12/08/21             Patient's condition has the potential to improve in response to therapy. Maximum improvement is yet to be obtained. The anticipated improvement is attainable and reasonable in a generally predictable time.  Patient reports adherence with HEP but admits she needs to do more SLS practice at home. She is still not confident in her ability to care for grandkids.   PATIENT EDUCATION: Education details: progress towards goals Person educated: Patient Education method: Explanation Education comprehension: verbalized understanding, returned demonstration, and needs further education   HOME EXERCISE  PROGRAM: Continue as previously given;    PT Short Term Goals - 12/08/21 2117       PT SHORT TERM GOAL #1   Title Patient will be adherent with HEP at least 3x a week to improve balance/strength and mobility;    Baseline 4/25: doesn't have a HEP 6/21: doing 3x a week   Time 4    Period Weeks    Status Achieved;    Target Date 01/05/22      PT SHORT TERM GOAL #2   Title Patient will be able to transfer sit<>Stand without pushing on arm rest with normal base of support, reporting minimal difficulty to improve transfer ability with ADLs.    Baseline 4/25: requires wide base of support and reports moderate difficulty; 6/21: independent, good positioning and control   Time 4    Period Weeks    Status Achieved;    Target Date 01/05/22              PT Long Term Goals - 02/02/22 0922       PT LONG TERM GOAL #1   Title Patient will improve 10 meter walk test to >1.2 m/s without AD to return to community ambulator gait speed and reduce fall risk.    Baseline 4/25: 1.13 m/s, 6/21: 1.5 m/s    Time 8    Period Weeks    Status Achieved    Target Date 02/02/22      PT LONG TERM GOAL #2   Title Patient will improve functional gait assessment to  >22/30 to indicate low risk for falls;    Baseline 4/25: 20/30, 6/21: 26/30    Time 8    Period Weeks    Status Achieved    Target Date 02/02/22      PT LONG TERM GOAL #3   Title Patient will improve FOTO score by 5% to indicate improved functional mobility with ADLs.    Baseline 4/25: 57%, 6/21: 56%    Time 4   Period Weeks    Status Not Met    Target Date 03/02/22     PT LONG TERM GOAL #4   Title Patient will be able to hold SLS for >5 sec each LE to indicate improved functional strength for negotiating obstacles and reduce fall risk.    Baseline 4/25: R: 5 sec, L 1 sec, 6/21:6/21: R: 15+ sec, L: 2-3 sec inconsistent    Time 4   Period Weeks    Status Partially Met    Target Date 03/02/22     PT LONG TERM GOAL #5   Title Patient will be able to ascend/descend 12 steps while holding 20#, independently to safely carry grandkids up/down steps at home.    Baseline 6/21: able to negotiate independent without carrying weight    Time 4    Period Weeks    Status New    Target Date 03/02/22      Additional Long Term Goals   Additional Long Term Goals Yes      PT LONG TERM GOAL #6   Title Pt will be independent in getting down and up from the floor in able to safely care for grandkids    Baseline 6/21: reports moderate difficulty    Time 4    Period Weeks    Status New    Target Date 03/02/22               Plan - 02/02/22 0858     Clinical  Impression Statement Patient motivated and participated well within session. She was instructed in LE stretches as she reports feeling more stiff lately. Patient instructed in outcome measures to address progress towards goals. She does exhibit improvement in gait speed and balance as evidenced by outcome measures. Patient is still having difficulty with SLS. She is wanting to be able to care for grandkids and will need to be able to carry them up/down steps. 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, GERD, insomnia(sleeps 6 hours a night), Depression (Neurologist wants her to see MD to have medications re-evaluated); tremors, Hypothyroidism; LLE knee replacement (3 years ago)    Examination-Activity Limitations Locomotion Level;Stairs;Stand    Examination-Participation Restrictions Cleaning;Community Activity;Shop;Volunteer;Yard Work    Stability/Clinical Decision Making Evolving/Moderate complexity    Rehab Potential Good    PT Frequency 2x / week    PT Duration 4 weeks    PT Treatment/Interventions Cryotherapy;Electrical Stimulation;Moist Heat;Gait training;Stair training;Functional mobility training;Therapeutic activities;Therapeutic exercise;Balance training;Neuromuscular re-education;Patient/family education;Energy conservation    PT Next Visit Plan no updates    PT Home Exercise Plan balance, strength    Consulted and Agree with Plan of Care Patient                    Remmie Bembenek, PT, DPT 02/02/2022, 9:27 AM

## 2022-02-07 ENCOUNTER — Ambulatory Visit: Payer: Medicare Other | Admitting: Physical Therapy

## 2022-02-07 ENCOUNTER — Encounter: Payer: Self-pay | Admitting: Physical Therapy

## 2022-02-07 DIAGNOSIS — R2681 Unsteadiness on feet: Secondary | ICD-10-CM

## 2022-02-07 DIAGNOSIS — R2689 Other abnormalities of gait and mobility: Secondary | ICD-10-CM

## 2022-02-07 DIAGNOSIS — M6281 Muscle weakness (generalized): Secondary | ICD-10-CM

## 2022-02-07 DIAGNOSIS — R278 Other lack of coordination: Secondary | ICD-10-CM

## 2022-02-09 ENCOUNTER — Ambulatory Visit: Payer: Medicare Other | Admitting: Physical Therapy

## 2022-02-09 ENCOUNTER — Encounter: Payer: Self-pay | Admitting: Physical Therapy

## 2022-02-09 DIAGNOSIS — R2681 Unsteadiness on feet: Secondary | ICD-10-CM | POA: Diagnosis not present

## 2022-02-09 DIAGNOSIS — M6281 Muscle weakness (generalized): Secondary | ICD-10-CM

## 2022-02-09 DIAGNOSIS — R278 Other lack of coordination: Secondary | ICD-10-CM

## 2022-02-09 DIAGNOSIS — R2689 Other abnormalities of gait and mobility: Secondary | ICD-10-CM

## 2022-02-09 NOTE — Therapy (Signed)
OUTPATIENT PHYSICAL THERAPY TREATMENT NOTE    Patient Name: [name] [name] MRN: [id number] DOB:26-Jun-1956, 66 y.o., female Today's Date: 02/09/2022  PCP: Delight Stare MD REFERRING PROVIDER: Jennings Books, MD   PT End of Session - 02/09/22 1016     Visit Number 12    Number of Visits 25    Date for PT Re-Evaluation 03/02/22    Authorization Type Medicare/ Progress note 02/02/22/recert 8/31-5/17    Authorization Time Period start of care 12/07/21    Progress Note Due on Visit 10    PT Start Time 1017    PT Stop Time 1044    PT Time Calculation (min) 27 min    Equipment Utilized During Treatment Gait belt    Activity Tolerance Patient tolerated treatment well;No increased pain    Behavior During Therapy WFL for tasks assessed/performed             Past Medical History:  Diagnosis Date   Actinic keratosis    Acute hepatitis C without mention of hepatic coma(070.51)    Arthritis    fingers   Carpal tunnel syndrome of left wrist    Depression    GERD (gastroesophageal reflux disease)    Hypothyroidism    Insomnia    Insomnia    Memory loss    Osteoporosis    Squamous cell carcinoma of skin 12/27/2021   Left pretibial - ED[facility]   Tremor    Wears dentures    full upper, partial lower   Past Surgical History:  Procedure Laterality Date   ANKLE SURGERY Right    BLADDER TACT     CATARACT EXTRACTION W/PHACO Left 10/13/2021   Procedure: CATARACT EXTRACTION PHACO AND INTRAOCULAR LENS PLACEMENT ([place]) LEFT;  Surgeon: Leandrew Koyanagi, MD;  Location: [facility];  Service: Ophthalmology;  Laterality: Left;  3.97 00:49.0   CATARACT EXTRACTION W/PHACO Right 10/27/2021   Procedure: CATARACT EXTRACTION PHACO AND INTRAOCULAR LENS PLACEMENT ([place]) RIGHT 4.50 00:58.9;  Surgeon: Leandrew Koyanagi, MD;  Location: [facility];  Service: Ophthalmology;  Laterality: Right;   HAND SURGERY Right    TONSILLECTOMY     Patient Active Problem List   Diagnosis Date Noted    Hepatitis C 08/19/2016   Depression 08/19/2016   Insomnia 08/19/2016   Hypothyroidism 08/19/2016    REFERRING DIAG: Imbalance  THERAPY DIAG:  Unsteadiness on feet  Muscle weakness (generalized)  Other lack of coordination  Other abnormalities of gait and mobility  Rationale for Evaluation and Treatment Rehabilitation  PERTINENT HISTORY: 66 yo Female 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 She reports having trouble with balance and tremors. She reports falling often. She presents to therapy without AD. She has 3 walkers (apartment is too tiny and so she furniture walks). 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 Neurologist did prescribe Carvidopa/levadopa, did have side effects of nausea/vomitting. She has stopped taking medication,  but is hoping to see Neurologist again to see if she can get another medication to help control side effects. Does report numbness/tingling in LUE hand, not in the feet; PMH: arthritis in hands/fingers, GERD, insomnia(sleeps 6 hours a night), Depression (Neurologist wants her to see MD to have medications re-evaluated); tremors, Hypothyroidism; LLE knee replacement (3 years ago)   PRECAUTIONS: Fall Risk  SUBJECTIVE: Pt reports doing well. No new falls since last week. She reports new exercises are going  well. No soreness.   PAIN:  Are you having pain? No     TODAY'S TREATMENT:  Warm up on Nustep BUE/BLE level 2-4 interval training with cues to keep steps per minute >80 to challenge cardiovascular conditioning x4 min   Instructed patient in LE/UE strengthening: Forward step ups on 6 inch step holding 10# in RUE   X10 reps each LE with LUE rail assist;  Standing on firm surface: Squat down holding orange weighted ball (5000 gram/11#) and lifting ball to chest x10 reps;  Walking around gym holding  weighted ball x180 feet  Standing BUE low row green tband x15 reps with min VCs for proper positioning;  BUE shoulder extension green tband x15 reps, cues to relax shoulder for better positioning;  Pt required min VCS for proper exercise technique for optimal strengthening;   Mini squat with bicep curl, green tband x10 reps;   NMR:  Standing on airex pad: Standing one foot on airex, one foot on 6 inch step  Unsupported standing x30 sec  Progressed to cone pass side/side x10 reps, min instability;  Pt reports feeling hot and requested to end session early. Reinforced HEP;  PATIENT EDUCATION: Education details: LE strengthening/balance Person educated: Patient Education method: Explanation Education comprehension: verbalized understanding, returned demonstration, and needs further education   HOME EXERCISE PROGRAM: Continue as previously given;  Access Code: [email or web address] URL: [email or web address] Date: 02/07/2022 Prepared by: Blanche East  Exercises - Standing Hip Extension with Resistance at Ankles and Counter Support  - 1 x daily - 4 x weekly - 2 sets - 10 reps - Standing Hip Abduction with Resistance at Ankles and Counter Support  - 1 x daily - 4 x weekly - 2 sets - 10 reps - Standing Hip Flexion with Resistance at Ankles and Counter Support  - 1 x daily - 4 x weekly - 2 sets - 10 reps - Side Stepping with Resistance at Ankles and Counter Support  - 1 x daily - 4 x weekly - 2 sets - 5 reps - Standing Bilateral Low Shoulder Row with Anchored Resistance  - 1 x daily - 4 x weekly - 2 sets - 15 reps - Shoulder Extension with Resistance - Palms Forward  - 1 x daily - 4 x weekly - 2 sets - 10 reps - Standing Bicep Curls with Resistance  - 1 x daily - 4 x weekly - 2 sets - 15 reps - Lunge with Counter Support  - 1 x daily - 4 x weekly - 1 sets - 10 reps   PT Short Term Goals - 12/08/21 2117       PT SHORT TERM GOAL #1   Title Patient will be adherent with HEP  at least 3x a week to improve balance/strength and mobility;    Baseline 4/25: doesn't have a HEP 6/21: doing 3x a week   Time 4    Period Weeks    Status Achieved;    Target Date 01/05/22      PT SHORT TERM GOAL #2   Title Patient will be able to transfer sit<>Stand without pushing on arm rest with normal base of support, reporting minimal difficulty to improve transfer ability with ADLs.    Baseline 4/25: requires wide base of support and reports moderate difficulty; 6/21: independent, good positioning and control   Time 4    Period Weeks    Status Achieved;    Target Date 01/05/22  PT Long Term Goals - 02/02/22 8841       PT LONG TERM GOAL #1   Title Patient will improve 10 meter walk test to >1.2 m/s without AD to return to community ambulator gait speed and reduce fall risk.    Baseline 4/25: 1.13 m/s, 6/21: 1.5 m/s    Time 8    Period Weeks    Status Achieved    Target Date 02/02/22      PT LONG TERM GOAL #2   Title Patient will improve functional gait assessment to >22/30 to indicate low risk for falls;    Baseline 4/25: 20/30, 6/21: 26/30    Time 8    Period Weeks    Status Achieved    Target Date 02/02/22      PT LONG TERM GOAL #3   Title Patient will improve FOTO score by 5% to indicate improved functional mobility with ADLs.    Baseline 4/25: 57%, 6/21: 56%    Time 4   Period Weeks    Status Not Met    Target Date 03/02/22     PT LONG TERM GOAL #4   Title Patient will be able to hold SLS for >5 sec each LE to indicate improved functional strength for negotiating obstacles and reduce fall risk.    Baseline 4/25: R: 5 sec, L 1 sec, 6/21:6/21: R: 15+ sec, L: 2-3 sec inconsistent    Time 4   Period Weeks    Status Partially Met    Target Date 03/02/22     PT LONG TERM GOAL #5   Title Patient will be able to ascend/descend 12 steps while holding 20#, independently to safely carry grandkids up/down steps at home.    Baseline 6/21: able to  negotiate independent without carrying weight    Time 4    Period Weeks    Status New    Target Date 03/02/22      Additional Long Term Goals   Additional Long Term Goals Yes      PT LONG TERM GOAL #6   Title Pt will be independent in getting down and up from the floor in able to safely care for grandkids    Baseline 6/21: reports moderate difficulty    Time 4    Period Weeks    Status New    Target Date 03/02/22               Plan -    Clinical Impression Statement Patient motivated and participated well within session. She was instructed in advanced LE/UE strengthening. Focused on UE strengthening for carrying weight as patient wishes to be able to pick up and carry grandkids. She does require min VCs for correct exercise technique/positioning.  Instructed patient in advanced balance exercise. She does have difficulty when standing on compliant surfaces requiring CGA-min A for safety when unsupported. She would benefit from additional skilled PT intervention to improve strength, balance and mobility;  Pt did get hot during session and requested to end session early.    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, GERD, insomnia(sleeps 6 hours a night), Depression (Neurologist wants her to see MD to have medications re-evaluated); tremors, Hypothyroidism; LLE knee replacement (3 years ago)    Examination-Activity Limitations Locomotion Level;Stairs;Stand    Examination-Participation Restrictions Cleaning;Community Activity;Shop;Volunteer;Yard Work    Stability/Clinical Decision Making Evolving/Moderate complexity    Rehab Potential Good    PT Frequency 2x / week  PT Duration 4 weeks    PT Treatment/Interventions Cryotherapy;Electrical Stimulation;Moist Heat;Gait training;Stair training;Functional mobility training;Therapeutic activities;Therapeutic exercise;Balance training;Neuromuscular re-education;Patient/family  education;Energy conservation    PT Next Visit Plan no updates    PT Home Exercise Plan balance, strength    Consulted and Agree with Plan of Care Patient                    Winthrop Shannahan, PT, DPT 02/09/2022, 10:56 AM

## 2022-02-14 ENCOUNTER — Ambulatory Visit: Payer: Medicare Other | Attending: Neurology

## 2022-02-14 DIAGNOSIS — R2689 Other abnormalities of gait and mobility: Secondary | ICD-10-CM | POA: Diagnosis present

## 2022-02-14 DIAGNOSIS — R2681 Unsteadiness on feet: Secondary | ICD-10-CM

## 2022-02-14 DIAGNOSIS — R278 Other lack of coordination: Secondary | ICD-10-CM | POA: Diagnosis present

## 2022-02-14 DIAGNOSIS — M6281 Muscle weakness (generalized): Secondary | ICD-10-CM | POA: Diagnosis present

## 2022-02-14 NOTE — Therapy (Signed)
OUTPATIENT PHYSICAL THERAPY TREATMENT NOTE    Patient Name: [name] [name] MRN: [id number] DOB:1956/04/01, 66 y.o., female Today's Date: 02/14/2022  PCP: Delight Stare MD REFERRING PROVIDER: Jennings Books, MD   PT End of Session - 02/14/22 1048     Visit Number 13    Number of Visits 25    Date for PT Re-Evaluation 03/02/22    Authorization Type Medicare/ Progress note 02/02/22/recert 7/89-3/81    Authorization Time Period start of care 12/07/21    Progress Note Due on Visit 10    PT Start Time 0930    PT Stop Time 1006    PT Time Calculation (min) 36 min    Equipment Utilized During Treatment Gait belt    Activity Tolerance Patient tolerated treatment well;No increased pain    Behavior During Therapy WFL for tasks assessed/performed              Past Medical History:  Diagnosis Date   Actinic keratosis    Acute hepatitis C without mention of hepatic coma(070.51)    Arthritis    fingers   Carpal tunnel syndrome of left wrist    Depression    GERD (gastroesophageal reflux disease)    Hypothyroidism    Insomnia    Insomnia    Memory loss    Osteoporosis    Squamous cell carcinoma of skin 12/27/2021   Left pretibial - [facility]   Tremor    Wears dentures    full upper, partial lower   Past Surgical History:  Procedure Laterality Date   ANKLE SURGERY Right    BLADDER TACT     CATARACT EXTRACTION W/PHACO Left 10/13/2021   Procedure: CATARACT EXTRACTION PHACO AND INTRAOCULAR LENS PLACEMENT ([place]) LEFT;  Surgeon: Leandrew Koyanagi, MD;  Location: [facility];  Service: Ophthalmology;  Laterality: Left;  3.97 00:49.0   CATARACT EXTRACTION W/PHACO Right 10/27/2021   Procedure: CATARACT EXTRACTION PHACO AND INTRAOCULAR LENS PLACEMENT ([place]) RIGHT 4.50 00:58.9;  Surgeon: Leandrew Koyanagi, MD;  Location: [facility];  Service: Ophthalmology;  Laterality: Right;   HAND SURGERY Right    TONSILLECTOMY     Patient Active Problem List   Diagnosis Date Noted    Hepatitis C 08/19/2016   Depression 08/19/2016   Insomnia 08/19/2016   Hypothyroidism 08/19/2016    REFERRING DIAG: Imbalance  THERAPY DIAG:  Muscle weakness (generalized)  Unsteadiness on feet  Rationale for Evaluation and Treatment Rehabilitation  PERTINENT HISTORY: 66 yo Female 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 She reports having trouble with balance and tremors. She reports falling often. She presents to therapy without AD. She has 3 walkers (apartment is too tiny and so she furniture walks). 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 Neurologist did prescribe Carvidopa/levadopa, did have side effects of nausea/vomitting. She has stopped taking medication,  but is hoping to see Neurologist again to see if she can get another medication to help control side effects. Does report numbness/tingling in LUE hand, not in the feet; PMH: arthritis in hands/fingers, GERD, insomnia(sleeps 6 hours a night), Depression (Neurologist wants her to see MD to have medications re-evaluated); tremors, Hypothyroidism; LLE knee replacement (3 years ago)   PRECAUTIONS: Fall Risk  SUBJECTIVE: Pt denies LOB/falls. Pt reports no aches/pains. Pt reports doing her HEP "not often enough," but is substituting this with working in her yard.   PAIN:  Are you having pain? No    TODAY'S TREATMENT:  Gait belt donned and CGA provided throughout unless otherwise specified   Therex: Standing on firm surface: Squat down holding orange weighted ball (5000 gram/11#) and lifting ball to chest x10 reps near balance bar-  Progressed to performing 10 STS/squats from airex pad without weight, and then 10x with 5000 gram ball   Leg press 40# 12x, 55# 20x, 75# 15x. Rated easy-medium   Matrix Cable machine rows:  7.5# 15x, 12.5# 15x, 17.5# 2x15  Matrix Cable machine  shoulder ext. 7.5# 10x, 12.5# 2x15   NMR:  One foot on airex, one foot on 6" step 30 sec each LE, progressed to performing wit horizontal head turns each LE position  FWD/BCKWD and LTL stepping over orange hurdle x multiple reps for each. Greatest difficulty with retro-step.  SLB 20-30 sec each LE. Toe-touch and intermittent UE support throughout to regain balance.   Ended early per pt request due to doctor's appointment following PT appointment    PATIENT EDUCATION: Education details: LE strengthening/balance Person educated: Patient Education method: Explanation Education comprehension: verbalized understanding, returned demonstration, and needs further education   HOME EXERCISE PROGRAM: no updates Continue as previously given;  Access Code: [email or web address] URL: [email or web address] Date: 02/07/2022 Prepared by: Blanche East  Exercises - Standing Hip Extension with Resistance at Ankles and Counter Support  - 1 x daily - 4 x weekly - 2 sets - 10 reps - Standing Hip Abduction with Resistance at Ankles and Counter Support  - 1 x daily - 4 x weekly - 2 sets - 10 reps - Standing Hip Flexion with Resistance at Ankles and Counter Support  - 1 x daily - 4 x weekly - 2 sets - 10 reps - Side Stepping with Resistance at Ankles and Counter Support  - 1 x daily - 4 x weekly - 2 sets - 5 reps - Standing Bilateral Low Shoulder Row with Anchored Resistance  - 1 x daily - 4 x weekly - 2 sets - 15 reps - Shoulder Extension with Resistance - Palms Forward  - 1 x daily - 4 x weekly - 2 sets - 10 reps - Standing Bicep Curls with Resistance  - 1 x daily - 4 x weekly - 2 sets - 15 reps - Lunge with Counter Support  - 1 x daily - 4 x weekly - 1 sets - 10 reps   PT Short Term Goals - 12/08/21 2117       PT SHORT TERM GOAL #1   Title Patient will be adherent with HEP at least 3x a week to improve balance/strength and mobility;    Baseline 4/25: doesn't have a HEP 6/21: doing 3x a week    Time 4    Period Weeks    Status Achieved;    Target Date 01/05/22      PT SHORT TERM GOAL #2   Title Patient will be able to transfer sit<>Stand without pushing on arm rest with normal base of support, reporting minimal difficulty to improve transfer ability with ADLs.    Baseline 4/25: requires wide base of support and reports moderate difficulty; 6/21: independent, good positioning and control   Time 4    Period Weeks    Status Achieved;    Target Date 01/05/22              PT Long Term Goals - 02/02/22 0922       PT LONG TERM GOAL #1  Title Patient will improve 10 meter walk test to >1.2 m/s without AD to return to community ambulator gait speed and reduce fall risk.    Baseline 4/25: 1.13 m/s, 6/21: 1.5 m/s    Time 8    Period Weeks    Status Achieved    Target Date 02/02/22      PT LONG TERM GOAL #2   Title Patient will improve functional gait assessment to >22/30 to indicate low risk for falls;    Baseline 4/25: 20/30, 6/21: 26/30    Time 8    Period Weeks    Status Achieved    Target Date 02/02/22      PT LONG TERM GOAL #3   Title Patient will improve FOTO score by 5% to indicate improved functional mobility with ADLs.    Baseline 4/25: 57%, 6/21: 56%    Time 4   Period Weeks    Status Not Met    Target Date 03/02/22     PT LONG TERM GOAL #4   Title Patient will be able to hold SLS for >5 sec each LE to indicate improved functional strength for negotiating obstacles and reduce fall risk.    Baseline 4/25: R: 5 sec, L 1 sec, 6/21:6/21: R: 15+ sec, L: 2-3 sec inconsistent    Time 4   Period Weeks    Status Partially Met    Target Date 03/02/22     PT LONG TERM GOAL #5   Title Patient will be able to ascend/descend 12 steps while holding 20#, independently to safely carry grandkids up/down steps at home.    Baseline 6/21: able to negotiate independent without carrying weight    Time 4    Period Weeks    Status New    Target Date 03/02/22       Additional Long Term Goals   Additional Long Term Goals Yes      PT LONG TERM GOAL #6   Title Pt will be independent in getting down and up from the floor in able to safely care for grandkids    Baseline 6/21: reports moderate difficulty    Time 4    Period Weeks    Status New    Target Date 03/02/22               Plan -    Clinical Impression Statement Patient highly motivated to participate in session. Continued instruction in advanced LE/UE strengthening. Pt able to advance strengthening with use of leg press and matrix cable machine.  Regarding balance pt had greatest difficulty with retro-stepping over hurdle, requiring use of step, ankle and hip strategy throughout. She would benefit from additional skilled PT intervention to improve strength, balance and mobility;  Pt requested session end early due to other Dr appointment following session.   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, GERD, insomnia(sleeps 6 hours a night), Depression (Neurologist wants her to see MD to have medications re-evaluated); tremors, Hypothyroidism; LLE knee replacement (3 years ago)    Examination-Activity Limitations Locomotion Level;Stairs;Stand    Examination-Participation Restrictions Cleaning;Community Activity;Shop;Volunteer;Yard Work    Stability/Clinical Decision Making Evolving/Moderate complexity    Rehab Potential Good    PT Frequency 2x / week    PT Duration 4 weeks    PT Treatment/Interventions Cryotherapy;Electrical Stimulation;Moist Heat;Gait training;Stair training;Functional mobility training;Therapeutic activities;Therapeutic exercise;Balance training;Neuromuscular re-education;Patient/family education;Energy conservation    PT Next Visit Plan no updates    PT Home Exercise  Plan balance, strength    Consulted and Agree with Plan of Care Patient                    Zollie Pee, PT, DPT 02/14/2022, 11:23 AM

## 2022-02-16 ENCOUNTER — Ambulatory Visit: Payer: Medicare Other

## 2022-02-16 DIAGNOSIS — R2681 Unsteadiness on feet: Secondary | ICD-10-CM

## 2022-02-16 DIAGNOSIS — M6281 Muscle weakness (generalized): Secondary | ICD-10-CM | POA: Diagnosis not present

## 2022-02-16 NOTE — Therapy (Signed)
OUTPATIENT PHYSICAL THERAPY TREATMENT NOTE    Patient Name: [name] [name] MRN: [id number] DOB:1956-07-06, 66 y.o., female Today's Date: 02/16/2022  PCP: Delight Stare MD REFERRING PROVIDER: Jennings Books, MD   PT End of Session - 02/16/22 1428     Visit Number 14    Number of Visits 25    Date for PT Re-Evaluation 03/02/22    Authorization Type Medicare/ Progress note 02/02/22/recert 8/14-4/81    Authorization Time Period start of care 12/07/21    Progress Note Due on Visit 10    PT Start Time 1432    PT Stop Time 1512    PT Time Calculation (min) 40 min    Equipment Utilized During Treatment Gait belt    Activity Tolerance Patient tolerated treatment well;No increased pain    Behavior During Therapy WFL for tasks assessed/performed               Past Medical History:  Diagnosis Date   Actinic keratosis    Acute hepatitis C without mention of hepatic coma(070.51)    Arthritis    fingers   Carpal tunnel syndrome of left wrist    Depression    GERD (gastroesophageal reflux disease)    Hypothyroidism    Insomnia    Insomnia    Memory loss    Osteoporosis    Squamous cell carcinoma of skin 12/27/2021   Left pretibial - EDC   Tremor    Wears dentures    full upper, partial lower   Past Surgical History:  Procedure Laterality Date   ANKLE SURGERY Right    BLADDER TACT     CATARACT EXTRACTION W/PHACO Left 10/13/2021   Procedure: CATARACT EXTRACTION PHACO AND INTRAOCULAR LENS PLACEMENT ([place]) LEFT;  Surgeon: Leandrew Koyanagi, MD;  Location: [facility];  Service: Ophthalmology;  Laterality: Left;  3.97 00:49.0   CATARACT EXTRACTION W/PHACO Right 10/27/2021   Procedure: CATARACT EXTRACTION PHACO AND INTRAOCULAR LENS PLACEMENT ([place]) RIGHT 4.50 00:58.9;  Surgeon: Leandrew Koyanagi, MD;  Location: [facility];  Service: Ophthalmology;  Laterality: Right;   HAND SURGERY Right    TONSILLECTOMY     Patient Active Problem List   Diagnosis Date Noted    Hepatitis C 08/19/2016   Depression 08/19/2016   Insomnia 08/19/2016   Hypothyroidism 08/19/2016    REFERRING DIAG: Imbalance  THERAPY DIAG:  Muscle weakness (generalized)  Unsteadiness on feet  Rationale for Evaluation and Treatment Rehabilitation  PERTINENT HISTORY: 66 yo Female 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 She reports having trouble with balance and tremors. She reports falling often. She presents to therapy without AD. She has 3 walkers (apartment is too tiny and so she furniture walks). 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 Neurologist did prescribe Carvidopa/levadopa, did have side effects of nausea/vomitting. She has stopped taking medication,  but is hoping to see Neurologist again to see if she can get another medication to help control side effects. Does report numbness/tingling in LUE hand, not in the feet; PMH: arthritis in hands/fingers, GERD, insomnia(sleeps 6 hours a night), Depression (Neurologist wants her to see MD to have medications re-evaluated); tremors, Hypothyroidism; LLE knee replacement (3 years ago)   PRECAUTIONS: Fall Risk  SUBJECTIVE: Pt reports no pain currently. Pt reports no recent stumbles/falls. Pt was able to easily lift a watermelon after last appointment, felt stronger. Pt was able  to walk backwards "dragging a hose" in her yard. She did not feel unsteady doing this and reports no stumbles/LOB.  PAIN: Are you having pain? No  TODAY'S TREATMENT: 02/16/2022     Therex:  Standing lunge 10x each LE. Rates hard, fatiguing.    Leg press 40# 15x, 55# 15x, 75# 2x10. Rated easy-medium   Matrix Cable machine rows:  12.5# 15x, 17.5# 15x, 22.5# 15x. Pt rates last set medium +  Standing hip ext. With 4# weights donned 10x each LE 15x each LE. Pt rates easy   Standing hip abduction with 4#  weights donned 2x10 each LE   NMR: Gait belt donned and CGA provided throughout unless otherwise specified  One foot on airex, one foot on 6" step 30 sec each LE  Progressed to performing with EC 30-40 sec each LE  Standing NBOS EC on airex 3x60 sec   Marching on airex 20x alt LE   Pt then performed isolated RLE march in order to progress LLE 15x  SLB 2x30 sec each LE. Intermittent toe-touch and UE support throughout   Over 10 meter track  Ambulation with EC CGA 2x  One more rep completed over 5 meters. Only slight R path deviation. No LOB   PATIENT EDUCATION: Education details: Pt educated throughout session about proper posture and technique with exercises. Improved exercise technique, movement at target joints, use of target muscles after min to mod verbal, visual, tactile cues.  Person educated: Patient Education method: Explanation, Demonstration, and Verbal cues Education comprehension: verbalized understanding, returned demonstration, and needs further education   HOME EXERCISE PROGRAM: no updates as of 02/16/2022 Continue as previously given;  Access Code: [email or web address] URL: [email or web address] Date: 02/07/2022 Prepared by: Blanche East  Exercises - Standing Hip Extension with Resistance at Ankles and Counter Support  - 1 x daily - 4 x weekly - 2 sets - 10 reps - Standing Hip Abduction with Resistance at Ankles and Counter Support  - 1 x daily - 4 x weekly - 2 sets - 10 reps - Standing Hip Flexion with Resistance at Ankles and Counter Support  - 1 x daily - 4 x weekly - 2 sets - 10 reps - Side Stepping with Resistance at Ankles and Counter Support  - 1 x daily - 4 x weekly - 2 sets - 5 reps - Standing Bilateral Low Shoulder Row with Anchored Resistance  - 1 x daily - 4 x weekly - 2 sets - 15 reps - Shoulder Extension with Resistance - Palms Forward  - 1 x daily - 4 x weekly - 2 sets - 10 reps - Standing Bicep Curls with Resistance  - 1 x daily - 4 x  weekly - 2 sets - 15 reps - Lunge with Counter Support  - 1 x daily - 4 x weekly - 1 sets - 10 reps   PT Short Term Goals - 12/08/21 2117       PT SHORT TERM GOAL #1   Title Patient will be adherent with HEP at least 3x a week to improve balance/strength and mobility;    Baseline 4/25: doesn't have a HEP 6/21: doing 3x a week   Time 4    Period Weeks    Status Achieved;    Target Date 01/05/22      PT SHORT TERM GOAL #2   Title Patient will be able to transfer sit<>Stand without pushing on arm rest with normal base of support, reporting minimal difficulty to improve transfer ability  with ADLs.    Baseline 4/25: requires wide base of support and reports moderate difficulty; 6/21: independent, good positioning and control   Time 4    Period Weeks    Status Achieved;    Target Date 01/05/22              PT Long Term Goals - 02/02/22 0922       PT LONG TERM GOAL #1   Title Patient will improve 10 meter walk test to >1.2 m/s without AD to return to community ambulator gait speed and reduce fall risk.    Baseline 4/25: 1.13 m/s, 6/21: 1.5 m/s    Time 8    Period Weeks    Status Achieved    Target Date 02/02/22      PT LONG TERM GOAL #2   Title Patient will improve functional gait assessment to >22/30 to indicate low risk for falls;    Baseline 4/25: 20/30, 6/21: 26/30    Time 8    Period Weeks    Status Achieved    Target Date 02/02/22      PT LONG TERM GOAL #3   Title Patient will improve FOTO score by 5% to indicate improved functional mobility with ADLs.    Baseline 4/25: 57%, 6/21: 56%    Time 4   Period Weeks    Status Not Met    Target Date 03/02/22     PT LONG TERM GOAL #4   Title Patient will be able to hold SLS for >5 sec each LE to indicate improved functional strength for negotiating obstacles and reduce fall risk.    Baseline 4/25: R: 5 sec, L 1 sec, 6/21:6/21: R: 15+ sec, L: 2-3 sec inconsistent    Time 4   Period Weeks    Status Partially Met     Target Date 03/02/22     PT LONG TERM GOAL #5   Title Patient will be able to ascend/descend 12 steps while holding 20#, independently to safely carry grandkids up/down steps at home.    Baseline 6/21: able to negotiate independent without carrying weight    Time 4    Period Weeks    Status New    Target Date 03/02/22      Additional Long Term Goals   Additional Long Term Goals Yes      PT LONG TERM GOAL #6   Title Pt will be independent in getting down and up from the floor in able to safely care for grandkids    Baseline 6/21: reports moderate difficulty    Time 4    Period Weeks    Status New    Target Date 03/02/22               Plan -    Clinical Impression Statement Patient continues to advance level or resistance and complexity/challenge of strengthening and balance interventions. She reports no pain with all interventions. Rating of difficulty varied throughout from easy-hard. Pt greatest difficulty with SLB on this date. She would benefit from additional skilled PT intervention to improve strength, balance and mobility.    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, GERD, insomnia(sleeps 6 hours a night), Depression (Neurologist wants her to see MD to have medications re-evaluated); tremors, Hypothyroidism; LLE knee replacement (3 years ago)    Examination-Activity Limitations Locomotion Level;Stairs;Stand    Examination-Participation Restrictions Cleaning;Community Activity;Shop;Volunteer;Yard Work    [employer] Evolving/Moderate complexity  Rehab Potential Good    PT Frequency 2x / week    PT Duration 4 weeks    PT Treatment/Interventions Cryotherapy;Electrical Stimulation;Moist Heat;Gait training;Stair training;Functional mobility training;Therapeutic activities;Therapeutic exercise;Balance training;Neuromuscular re-education;Patient/family education;Energy conservation    PT Next  Visit Plan no updates    PT Home Exercise Plan balance, strength    Consulted and Agree with Plan of Care Patient                    Zollie Pee, PT, DPT 02/16/2022, 3:52 PM

## 2022-02-21 ENCOUNTER — Ambulatory Visit: Payer: Medicare Other

## 2022-02-21 DIAGNOSIS — R2681 Unsteadiness on feet: Secondary | ICD-10-CM

## 2022-02-21 DIAGNOSIS — M6281 Muscle weakness (generalized): Secondary | ICD-10-CM | POA: Diagnosis not present

## 2022-02-21 DIAGNOSIS — R2689 Other abnormalities of gait and mobility: Secondary | ICD-10-CM

## 2022-02-21 DIAGNOSIS — R278 Other lack of coordination: Secondary | ICD-10-CM

## 2022-02-21 NOTE — Therapy (Signed)
OUTPATIENT PHYSICAL THERAPY TREATMENT NOTE    Patient Name: [name] [name] MRN: [id number] DOB:08/30/1955, 66 y.o., female Today's Date: 02/21/2022  PCP: Delight Stare MD REFERRING PROVIDER: Jennings Books, MD   PT End of Session - 02/21/22 0930     Visit Number 15    Number of Visits 25    Date for PT Re-Evaluation 03/02/22    Authorization Type Medicare/ Progress note 02/02/22/recert 5/78-4/69    Authorization Time Period start of care 12/07/21    Progress Note Due on Visit 10    PT Start Time 0930    PT Stop Time 1013    PT Time Calculation (min) 43 min    Equipment Utilized During Treatment Gait belt    Activity Tolerance Patient tolerated treatment well    Behavior During Therapy WFL for tasks assessed/performed                Past Medical History:  Diagnosis Date   Actinic keratosis    Acute hepatitis C without mention of hepatic coma(070.51)    Arthritis    fingers   Carpal tunnel syndrome of left wrist    Depression    GERD (gastroesophageal reflux disease)    Hypothyroidism    Insomnia    Insomnia    Memory loss    Osteoporosis    Squamous cell carcinoma of skin 12/27/2021   Left pretibial - ED[facility]   Tremor    Wears dentures    full upper, partial lower   Past Surgical History:  Procedure Laterality Date   ANKLE SURGERY Right    BLADDER TACT     CATARACT EXTRACTION W/PHACO Left 10/13/2021   Procedure: CATARACT EXTRACTION PHACO AND INTRAOCULAR LENS PLACEMENT ([place]) LEFT;  Surgeon: Leandrew Koyanagi, MD;  Location: [facility];  Service: Ophthalmology;  Laterality: Left;  3.97 00:49.0   CATARACT EXTRACTION W/PHACO Right 10/27/2021   Procedure: CATARACT EXTRACTION PHACO AND INTRAOCULAR LENS PLACEMENT ([place]) RIGHT 4.50 00:58.9;  Surgeon: Leandrew Koyanagi, MD;  Location: 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 She reports having trouble with balance and tremors. She reports falling often. She presents to therapy without AD. She has 3 walkers (apartment is too tiny and so she furniture walks). 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 Neurologist did prescribe Carvidopa/levadopa, did have side effects of nausea/vomitting. She has stopped taking medication,  but is hoping to see Neurologist again to see if she can get another medication to help control side effects. Does report numbness/tingling in LUE hand, not in the feet; PMH: arthritis in hands/fingers, GERD, insomnia(sleeps 6 hours a night), Depression (Neurologist wants her to see MD to have medications re-evaluated); tremors, Hypothyroidism; LLE knee replacement (3 years ago)   PRECAUTIONS: Fall Risk  SUBJECTIVE: Pt reports "been good" since last session, denies aches/pains. Reports no stumbles or falls. Pt  reports having difficulties with SLB.  PAIN: Are you having pain? No   TODAY'S TREATMENT:  02/21/2022  Standing hip ext: 2 x 15, 5# AW donned  Standing hip abd: 2 x 15, 5# AW donned, rates as hard  Leg press: 55# 20x, 70# 15x & 10x, pt rates med-hard   Matrix Cable machine rows:  17.5# 15x, 22.5# 2 x 15. Pt rates as challenging   Gait belt donned and CGA provided throughout unless otherwise specified  On airex: - NBOS, EC: 2 x 60 sec  - Tandem stance: 2 x 60 sec each way, noted minor ankle  instability - Slow march: 2 x 30 reps  Static surface: - SLB: 2 x 30 sec each leg  Over 10 meter track: - Tandem gait: 2x,  - Ambulation with EC for dynamic balance: 2x, minor R path deviation noted   PATIENT EDUCATION: Education details: Pt educated throughout session about proper posture and technique with exercises. Improved exercise technique, movement at target joints, use of target muscles after min to mod verbal, visual, tactile cues. Person educated: Patient Education method: Explanation, Demonstration, and Verbal cues Education comprehension: verbalized understanding, returned demonstration, and needs further education   HOME EXERCISE PROGRAM:  02/21/2022: Continue as previously given  Access Code: [email or web address] URL: [email or web address] Date: 02/07/2022 Prepared by: Blanche East  Exercises - Standing Hip Extension with Resistance at Ankles and Counter Support  - 1 x daily - 4 x weekly - 2 sets - 10 reps - Standing Hip Abduction with Resistance at Ankles and Counter Support  - 1 x daily - 4 x weekly - 2 sets - 10 reps - Standing Hip Flexion with Resistance at Ankles and Counter Support  - 1 x daily - 4 x weekly - 2 sets - 10 reps - Side Stepping with Resistance at Ankles and Counter Support  - 1 x daily - 4 x weekly - 2 sets - 5 reps - Standing Bilateral Low Shoulder Row with Anchored Resistance  - 1 x daily - 4 x weekly - 2 sets - 15 reps - Shoulder Extension with Resistance - Palms Forward  - 1 x daily - 4 x weekly - 2 sets - 10 reps - Standing Bicep Curls with Resistance  - 1 x daily - 4 x weekly - 2 sets - 15 reps - Lunge with Counter Support  - 1 x daily - 4 x weekly - 1 sets - 10 reps   PT Short Term Goals - 12/08/21 2117       PT SHORT TERM GOAL #1   Title Patient will be adherent with HEP at least 3x a week to improve balance/strength and mobility;    Baseline 4/25: doesn't have a HEP 6/21: doing 3x a week   Time 4    Period Weeks    Status  Achieved;    Target Date 01/05/22      PT SHORT TERM GOAL #2   Title Patient will be able to transfer sit<>Stand without pushing on arm rest with normal base of support, reporting minimal difficulty to improve transfer ability with ADLs.    Baseline 4/25: requires wide base of support and reports moderate difficulty; 6/21: independent, good positioning and control   Time 4    Period Weeks    Status Achieved;    Target Date 01/05/22              PT Long Term Goals - 02/02/22 0867       PT  LONG TERM GOAL #1   Title Patient will improve 10 meter walk test to >1.2 m/s without AD to return to community ambulator gait speed and reduce fall risk.    Baseline 4/25: 1.13 m/s, 6/21: 1.5 m/s    Time 8    Period Weeks    Status Achieved    Target Date 02/02/22      PT LONG TERM GOAL #2   Title Patient will improve functional gait assessment to >22/30 to indicate low risk for falls;    Baseline 4/25: 20/30, 6/21: 26/30    Time 8    Period Weeks    Status Achieved    Target Date 02/02/22      PT LONG TERM GOAL #3   Title Patient will improve FOTO score by 5% to indicate improved functional mobility with ADLs.    Baseline 4/25: 57%, 6/21: 56%    Time 4   Period Weeks    Status Not Met    Target Date 03/02/22     PT LONG TERM GOAL #4   Title Patient will be able to hold SLS for >5 sec each LE to indicate improved functional strength for negotiating obstacles and reduce fall risk.    Baseline 4/25: R: 5 sec, L 1 sec, 6/21:6/21: R: 15+ sec, L: 2-3 sec inconsistent    Time 4   Period Weeks    Status Partially Met    Target Date 03/02/22     PT LONG TERM GOAL #5   Title Patient will be able to ascend/descend 12 steps while holding 20#, independently to safely carry grandkids up/down steps at home.    Baseline 6/21: able to negotiate independent without carrying weight    Time 4    Period Weeks    Status New    Target Date 03/02/22      Additional Long Term Goals   Additional  Long Term Goals Yes      PT LONG TERM GOAL #6   Title Pt will be independent in getting down and up from the floor in able to safely care for grandkids    Baseline 6/21: reports moderate difficulty    Time 4    Period Weeks    Status New    Target Date 03/02/22               Plan -    Clinical Impression Statement Continued focus on advancement of strengthening and balance interventions. Pt reports increase difficulty with BLE with SLB today compared to last session. Pt tolerated advancement of resistance with strengthening exercises well with no reports of pain. Pt continued to demonstrate minor path deviations to the R with EC ambulation. The pt would benefit from further skilled PT to improve strength, balance, and mobility.   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, GERD, insomnia(sleeps 6 hours a night), Depression (Neurologist wants her to see MD to have medications re-evaluated); tremors, Hypothyroidism; LLE knee replacement (3 years ago)    Examination-Activity Limitations Locomotion Level;Stairs;Stand    Examination-Participation Restrictions Cleaning;Community Activity;Shop;Volunteer;Yard Work    Stability/Clinical Decision Making Evolving/Moderate complexity    Rehab Potential Good    PT Frequency 2x / week    PT Duration 4 weeks    PT Treatment/Interventions Cryotherapy;Electrical Stimulation;Moist Heat;Gait training;Stair training;Functional mobility training;Therapeutic activities;Therapeutic exercise;Balance training;Neuromuscular re-education;Patient/family education;Energy conservation    PT Next Visit Plan Progressing strength and balance as appropriate       Consulted  and Agree with Plan of Care Patient                 Izola Price, SPT  This entire session was performed under direct supervision and direction of a licensed therapist. I have personally read, edited and approve of the note as  written. Ricard Dillon PT, DPT   Zollie Pee, PT 02/21/2022, 1:09 PM

## 2022-02-23 ENCOUNTER — Ambulatory Visit: Payer: Medicare Other

## 2022-02-23 DIAGNOSIS — R278 Other lack of coordination: Secondary | ICD-10-CM

## 2022-02-23 DIAGNOSIS — M6281 Muscle weakness (generalized): Secondary | ICD-10-CM | POA: Diagnosis not present

## 2022-02-23 DIAGNOSIS — R2689 Other abnormalities of gait and mobility: Secondary | ICD-10-CM

## 2022-02-23 DIAGNOSIS — R2681 Unsteadiness on feet: Secondary | ICD-10-CM

## 2022-02-23 NOTE — Therapy (Signed)
OUTPATIENT PHYSICAL THERAPY TREATMENT NOTE    Patient Name: [name] [name] MRN: [id number] DOB:[birth year], 66 y.o., female Today's Date: 02/23/2022  PCP: Delight Stare MD REFERRING PROVIDER: Jennings Books, MD   PT End of Session - 02/23/22 0922     Visit Number 16    Number of Visits 25    Date for PT Re-Evaluation 03/02/22    Authorization Type Medicare/ Progress note 02/02/22/recert 2/19-7/58    Authorization Time Period start of care 12/07/21    Progress Note Due on Visit 20    PT Start Time 0930    PT Stop Time 1015    PT Time Calculation (min) 45 min    Equipment Utilized During Treatment Gait belt    Activity Tolerance Patient tolerated treatment well    Behavior During Therapy WFL for tasks assessed/performed                 Past Medical History:  Diagnosis Date   Actinic keratosis    Acute hepatitis C without mention of hepatic coma(070.51)    Arthritis    fingers   Carpal tunnel syndrome of left wrist    Depression    GERD (gastroesophageal reflux disease)    Hypothyroidism    Insomnia    Insomnia    Memory loss    Osteoporosis    Squamous cell carcinoma of skin 12/27/2021   Left pretibial - EDC   Tremor    Wears dentures    full upper, partial lower   Past Surgical History:  Procedure Laterality Date   ANKLE SURGERY Right    BLADDER TACT     CATARACT EXTRACTION W/PHACO Left 10/13/2021   Procedure: CATARACT EXTRACTION PHACO AND INTRAOCULAR LENS PLACEMENT ([place]) LEFT;  Surgeon: Leandrew Koyanagi, MD;  Location: [facility];  Service: Ophthalmology;  Laterality: Left;  3.97 00:49.0   CATARACT EXTRACTION W/PHACO Right 10/27/2021   Procedure: CATARACT EXTRACTION PHACO AND INTRAOCULAR LENS PLACEMENT ([place]) RIGHT 4.50 00:58.9;  Surgeon: Leandrew Koyanagi, MD;  Location: [facility];  Service: Ophthalmology;  Laterality: Right;   HAND SURGERY Right    TONSILLECTOMY     Patient Active Problem List   Diagnosis Date Noted   Hepatitis C  08/19/2016   Depression 08/19/2016   Insomnia 08/19/2016   Hypothyroidism 08/19/2016    REFERRING DIAG: Imbalance  THERAPY DIAG:  Muscle weakness (generalized)  Unsteadiness on feet  Other lack of coordination  Other abnormalities of gait and mobility  Rationale for Evaluation and Treatment Rehabilitation  PERTINENT HISTORY: 66 yo Female 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 She reports having trouble with balance and tremors. She reports falling often. She presents to therapy without AD. She has 3 walkers (apartment is too tiny and so she furniture walks). 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 Neurologist did prescribe Carvidopa/levadopa, did have side effects of nausea/vomitting. She has stopped taking medication,  but is hoping to see Neurologist again to see if she can get another medication to help control side effects. Does report numbness/tingling in LUE hand, not in the feet; PMH: arthritis in hands/fingers, GERD, insomnia(sleeps 6 hours a night), Depression (Neurologist wants her to see MD to have medications re-evaluated); tremors, Hypothyroidism; LLE knee replacement (3 years ago)   PRECAUTIONS: Fall Risk  SUBJECTIVE: Pt reports been "good" since last visit, denies aches/pains. Reports no stumbles or falls.  Pt reports on her feet a lot yesterday working in her garden and her back is in a little discomfort.  PAIN: Are you having pain? No    TODAY'S TREATMENT:  02/23/2022 - Gait belt donned and CGA provided throughout unless otherwise specified  STS on airex: 2 x 12, pt reports slight pain in B knees, subsides with rest  FWD lunge with large amplitude reach: 10x each LE, increased difficulty with LLE compared to R noted  LTL lunge with  large amplitude reach with ipsilateral UE: 10x each LE, pt reports increased  difficulty with LLE  Stepping over half foam: FWD/BKWD & LTL, 20x each way, mo major LOB noted, VC for large steps  Tandem stance on airex: 2 x 60 sec each way. Improved stability noted compared to last session, however, still difficult for pt  Seated hip abduction: 12x with 3 sec hold, GTB, rates as easy  Progressed to BTB, 12x with hold  SLB on static surface: 2 x 45 sec each leg, increased difficulty with LLE compared to R noted improved with time  On Matrix machine: - Rows: 20x @ 17.5# for warm up, 15x @ 22.5#, 10x @ 27.5#    PATIENT EDUCATION: Education details: Pt educated throughout session about proper posture and technique with exercises. Improved exercise technique, movement at target joints, use of target muscles after min to mod verbal, visual, tactile cues. Person educated: Patient Education method: Explanation, Demonstration, and Verbal cues Education comprehension: verbalized understanding, returned demonstration, and needs further education   HOME EXERCISE PROGRAM:  02/23/2022: Continue as previously given  Access Code: [email or web address] URL: [email or web address] Date: 02/07/2022 Prepared by: Blanche East  Exercises - Standing Hip Extension with Resistance at Ankles and Counter Support  - 1 x daily - 4 x weekly - 2 sets - 10 reps - Standing Hip Abduction with Resistance at Ankles and Counter Support  - 1 x daily - 4 x weekly - 2 sets - 10 reps - Standing Hip Flexion with Resistance at Ankles and Counter Support  - 1 x daily - 4 x weekly - 2 sets - 10 reps - Side Stepping with Resistance at Ankles and Counter Support  - 1 x daily - 4 x weekly - 2 sets - 5 reps - Standing Bilateral Low Shoulder Row with Anchored Resistance  - 1 x daily - 4 x weekly - 2 sets - 15 reps - Shoulder Extension with Resistance - Palms Forward  - 1 x daily - 4 x weekly - 2 sets - 10 reps - Standing Bicep Curls with Resistance  - 1 x daily - 4 x weekly - 2 sets - 15 reps - Lunge  with Counter Support  - 1 x daily - 4 x weekly - 1 sets - 10 reps   PT Short Term Goals - 12/08/21 2117       PT SHORT TERM GOAL #1   Title Patient will be adherent with HEP at least 3x a week to improve balance/strength and mobility;    Baseline 4/25: doesn't have a HEP 6/21: doing 3x a week   Time 4    Period Weeks    Status Achieved;    Target Date 01/05/22      PT SHORT TERM GOAL #2   Title Patient will be able to transfer sit<>Stand without pushing on arm rest with normal base of support, reporting minimal difficulty to improve transfer ability with ADLs.    Baseline 4/25: requires wide base of support and  reports moderate difficulty; 6/21: independent, good positioning and control   Time 4    Period Weeks    Status Achieved;    Target Date 01/05/22              PT Long Term Goals - 02/02/22 0922       PT LONG TERM GOAL #1   Title Patient will improve 10 meter walk test to >1.2 m/s without AD to return to community ambulator gait speed and reduce fall risk.    Baseline 4/25: 1.13 m/s, 6/21: 1.5 m/s    Time 8    Period Weeks    Status Achieved    Target Date 02/02/22      PT LONG TERM GOAL #2   Title Patient will improve functional gait assessment to >22/30 to indicate low risk for falls;    Baseline 4/25: 20/30, 6/21: 26/30    Time 8    Period Weeks    Status Achieved    Target Date 02/02/22      PT LONG TERM GOAL #3   Title Patient will improve FOTO score by 5% to indicate improved functional mobility with ADLs.    Baseline 4/25: 57%, 6/21: 56%    Time 4   Period Weeks    Status Not Met    Target Date 03/02/22     PT LONG TERM GOAL #4   Title Patient will be able to hold SLS for >5 sec each LE to indicate improved functional strength for negotiating obstacles and reduce fall risk.    Baseline 4/25: R: 5 sec, L 1 sec, 6/21:6/21: R: 15+ sec, L: 2-3 sec inconsistent    Time 4   Period Weeks    Status Partially Met    Target Date 03/02/22     PT LONG TERM  GOAL #5   Title Patient will be able to ascend/descend 12 steps while holding 20#, independently to safely carry grandkids up/down steps at home.    Baseline 6/21: able to negotiate independent without carrying weight    Time 4    Period Weeks    Status New    Target Date 03/02/22      Additional Long Term Goals   Additional Long Term Goals Yes      PT LONG TERM GOAL #6   Title Pt will be independent in getting down and up from the floor in able to safely care for grandkids    Baseline 6/21: reports moderate difficulty    Time 4    Period Weeks    Status New    Target Date 03/02/22               Plan -    Clinical Impression Statement Focus of today's session was on dynamic balance. Increased instability in LLE noted with balance interventions, however, has improved compared to last session. Pt tolerated increase in resistance with rows well and maintained proper form. The pt would benefit from further skilled PT to improve strength, balance, and mobility.   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, GERD, insomnia(sleeps 6 hours a night), Depression (Neurologist wants her to see MD to have medications re-evaluated); tremors, Hypothyroidism; LLE knee replacement (3 years ago)    Examination-Activity Limitations Locomotion Level;Stairs;Stand    Examination-Participation Restrictions Cleaning;Community Activity;Shop;Volunteer;Yard Work    Stability/Clinical Decision Making Evolving/Moderate complexity    Rehab Potential Good    PT Frequency 2x / week  PT Duration 4 weeks    PT Treatment/Interventions Cryotherapy;Electrical Stimulation;Moist Heat;Gait training;Stair training;Functional mobility training;Therapeutic activities;Therapeutic exercise;Balance training;Neuromuscular re-education;Patient/family education;Energy conservation    PT Next Visit Plan Continue progressing strength and balance as appropriate        Consulted and Agree with Plan of Care Patient                 Izola Price, SPT  This entire session was performed under direct supervision and direction of a licensed therapist. I have personally read, edited and approve of the note as written. Ricard Dillon PT, DPT   Zollie Pee, PT 02/23/2022, 11:01 AM

## 2022-02-28 ENCOUNTER — Ambulatory Visit: Payer: Medicare Other | Admitting: Physical Therapy

## 2022-02-28 DIAGNOSIS — R2681 Unsteadiness on feet: Secondary | ICD-10-CM

## 2022-02-28 DIAGNOSIS — R2689 Other abnormalities of gait and mobility: Secondary | ICD-10-CM

## 2022-02-28 DIAGNOSIS — M6281 Muscle weakness (generalized): Secondary | ICD-10-CM

## 2022-02-28 DIAGNOSIS — R278 Other lack of coordination: Secondary | ICD-10-CM

## 2022-02-28 NOTE — Therapy (Signed)
OUTPATIENT PHYSICAL THERAPY TREATMENT NOTE    Patient Name: [name] [name] MRN: [id number] DOB:Jan 03, 1956, 66 y.o., female Today's Date: 02/28/2022  PCP: Delight Stare MD REFERRING PROVIDER: Jennings Books, MD       Past Medical History:  Diagnosis Date   Actinic keratosis    Acute hepatitis C without mention of hepatic coma(070.51)    Arthritis    fingers   Carpal tunnel syndrome of left wrist    Depression    GERD (gastroesophageal reflux disease)    Hypothyroidism    Insomnia    Insomnia    Memory loss    Osteoporosis    Squamous cell carcinoma of skin 12/27/2021   Left pretibial - ED[facility]   Tremor    Wears dentures    full upper, partial lower   Past Surgical History:  Procedure Laterality Date   ANKLE SURGERY Right    BLADDER TACT     CATARACT EXTRACTION W/PHACO Left 10/13/2021   Procedure: CATARACT EXTRACTION PHACO AND INTRAOCULAR LENS PLACEMENT ([place]) LEFT;  Surgeon: Leandrew Koyanagi, MD;  Location: [facility];  Service: Ophthalmology;  Laterality: Left;  3.97 00:49.0   CATARACT EXTRACTION W/PHACO Right 10/27/2021   Procedure: CATARACT EXTRACTION PHACO AND INTRAOCULAR LENS PLACEMENT ([place]) RIGHT 4.50 00:58.9;  Surgeon: Leandrew Koyanagi, MD;  Location: [facility];  Service: Ophthalmology;  Laterality: Right;   HAND SURGERY Right    TONSILLECTOMY     Patient Active Problem List   Diagnosis Date Noted   Hepatitis C 08/19/2016   Depression 08/19/2016   Insomnia 08/19/2016   Hypothyroidism 08/19/2016    REFERRING DIAG: Imbalance  THERAPY DIAG:  No diagnosis found.  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 She reports having trouble with balance and tremors. She reports falling often. She presents to therapy without AD. She has 3 walkers (apartment is too tiny and so she furniture walks). 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 Neurologist did prescribe Carvidopa/levadopa, did have side effects of nausea/vomitting. She has stopped taking medication,  but is hoping to see Neurologist again to see if she can get another medication to help control side effects. Does report numbness/tingling in LUE hand, not in the feet; PMH: arthritis in hands/fingers, GERD, insomnia(sleeps 6 hours a night), Depression (Neurologist wants her to see MD to have medications re-evaluated); tremors, Hypothyroidism; LLE knee replacement (3 years ago)   PRECAUTIONS: Fall Risk  SUBJECTIVE: Pt reports tripping over a tomato stake over the weekend resulting in a fall but sustained no injuries as a result.   PAIN: Are you having pain? No  TODAY'S TREATMENT: 02/28/22      Therex:  Standing lunge 10x each LE. Rates hard, fatiguing.    Leg press 40# 15x, 55# 15x, 70# 2x12 85# x 10 . Rated easy-medium  Standing hip ext. With 4# weights donned 10x each LE 15x each LE. Pt rates easy   Standing hip abduction with 4# weights donned 2x10 each LE  On Matrix machine: - Rows: 15x @ 17.5# for warm up, 12x @ 22.5#, 10x @ 27.5#   NMR: Gait belt donned and CGA provided throughout unless otherwise specified  One foot on airex, one foot on 6" step 30 sec each LE  Progressed to performing with EC 45 sec each LE  Standing  NBOS EC on airex 3x60 sec   Marching on airex 20x alt LE w/ 4# AW      PATIENT EDUCATION: Education details: Pt educated throughout session about proper posture and technique with exercises. Improved exercise technique, movement at target joints, use of target muscles after min to mod verbal, visual, tactile cues.  Person educated: Patient Education method: Explanation, Demonstration, and Verbal cues Education comprehension: verbalized understanding, returned demonstration, and needs further  education   HOME EXERCISE PROGRAM: no updates as of 02/16/2022 Continue as previously given;  Access Code: [email or web address] URL: [email or web address] Date: 02/07/2022 Prepared by: Blanche East  Exercises - Standing Hip Extension with Resistance at Ankles and Counter Support  - 1 x daily - 4 x weekly - 2 sets - 10 reps - Standing Hip Abduction with Resistance at Ankles and Counter Support  - 1 x daily - 4 x weekly - 2 sets - 10 reps - Standing Hip Flexion with Resistance at Ankles and Counter Support  - 1 x daily - 4 x weekly - 2 sets - 10 reps - Side Stepping with Resistance at Ankles and Counter Support  - 1 x daily - 4 x weekly - 2 sets - 5 reps - Standing Bilateral Low Shoulder Row with Anchored Resistance  - 1 x daily - 4 x weekly - 2 sets - 15 reps - Shoulder Extension with Resistance - Palms Forward  - 1 x daily - 4 x weekly - 2 sets - 10 reps - Standing Bicep Curls with Resistance  - 1 x daily - 4 x weekly - 2 sets - 15 reps - Lunge with Counter Support  - 1 x daily - 4 x weekly - 1 sets - 10 reps   PT Short Term Goals - 12/08/21 2117       PT SHORT TERM GOAL #1   Title Patient will be adherent with HEP at least 3x a week to improve balance/strength and mobility;    Baseline 4/25: doesn't have a HEP 6/21: doing 3x a week   Time 4    Period Weeks    Status Achieved;    Target Date 01/05/22      PT SHORT TERM GOAL #2   Title Patient will be able to transfer sit<>Stand without pushing on arm rest with normal base of support, reporting minimal difficulty to improve transfer ability with ADLs.    Baseline 4/25: requires wide base of support and reports moderate difficulty; 6/21: independent, good positioning and control   Time 4    Period Weeks    Status Achieved;    Target Date 01/05/22              PT Long Term Goals - 02/02/22 0922       PT LONG TERM GOAL #1   Title Patient will improve 10 meter walk test to >1.2 m/s without AD to return to community  ambulator gait speed and reduce fall risk.    Baseline 4/25: 1.13 m/s, 6/21: 1.5 m/s    Time 8    Period Weeks    Status Achieved    Target Date 02/02/22      PT LONG TERM GOAL #2   Title Patient will improve functional gait assessment to >22/30 to indicate low risk for falls;    Baseline 4/25: 20/30, 6/21: 26/30    Time 8    Period Weeks    Status Achieved    Target Date 02/02/22      PT  LONG TERM GOAL #3   Title Patient will improve FOTO score by 5% to indicate improved functional mobility with ADLs.    Baseline 4/25: 57%, 6/21: 56%    Time 4   Period Weeks    Status Not Met    Target Date 03/02/22     PT LONG TERM GOAL #4   Title Patient will be able to hold SLS for >5 sec each LE to indicate improved functional strength for negotiating obstacles and reduce fall risk.    Baseline 4/25: R: 5 sec, L 1 sec, 6/21:6/21: R: 15+ sec, L: 2-3 sec inconsistent    Time 4   Period Weeks    Status Partially Met    Target Date 03/02/22     PT LONG TERM GOAL #5   Title Patient will be able to ascend/descend 12 steps while holding 20#, independently to safely carry grandkids up/down steps at home.    Baseline 6/21: able to negotiate independent without carrying weight    Time 4    Period Weeks    Status New    Target Date 03/02/22      Additional Long Term Goals   Additional Long Term Goals Yes      PT LONG TERM GOAL #6   Title Pt will be independent in getting down and up from the floor in able to safely care for grandkids    Baseline 6/21: reports moderate difficulty    Time 4    Period Weeks    Status New    Target Date 03/02/22               Plan -    Clinical Impression Statement Continued with current plan of care as laid out in evaluation and recent prior sessions. Pt remains motivated to advance progress toward goals in order to maximize independence and safety at home. Pt requires high level assistance and cuing for completion of exercises in order to provide  adequate level of stimulation and perturbation. Author allows pt as much opportunity as possible to perform independent righting strategies, only stepping in when pt is unable to prevent falling to floor. Pt also continues with UE strengthening this sessin and reports significant improvement in this area AEB improved ability for picking up and carrying objects like watermelon.   Pt continues to demonstrate progress toward goals AEB progression of some interventions this date either in volume or intensity.     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, GERD, insomnia(sleeps 6 hours a night), Depression (Neurologist wants her to see MD to have medications re-evaluated); tremors, Hypothyroidism; LLE knee replacement (3 years ago)    Examination-Activity Limitations Locomotion Level;Stairs;Stand    Examination-Participation Restrictions Cleaning;Community Activity;Shop;Volunteer;Yard Work    Stability/Clinical Decision Making Evolving/Moderate complexity    Rehab Potential Good    PT Frequency 2x / week    PT Duration 4 weeks    PT Treatment/Interventions Cryotherapy;Electrical Stimulation;Moist Heat;Gait training;Stair training;Functional mobility training;Therapeutic activities;Therapeutic exercise;Balance training;Neuromuscular re-education;Patient/family education;Energy conservation    PT Next Visit Plan no updates    PT Home Exercise Plan balance, strength    Consulted and Agree with Plan of Care Patient                    Particia Lather, PT, DPT 02/28/2022, 8:05 AM

## 2022-03-02 ENCOUNTER — Ambulatory Visit: Payer: Medicare Other

## 2022-03-02 DIAGNOSIS — R278 Other lack of coordination: Secondary | ICD-10-CM

## 2022-03-02 DIAGNOSIS — M6281 Muscle weakness (generalized): Secondary | ICD-10-CM | POA: Diagnosis not present

## 2022-03-02 DIAGNOSIS — R2681 Unsteadiness on feet: Secondary | ICD-10-CM

## 2022-03-02 DIAGNOSIS — R2689 Other abnormalities of gait and mobility: Secondary | ICD-10-CM

## 2022-03-02 NOTE — Therapy (Signed)
OUTPATIENT PHYSICAL THERAPY TREATMENT NOTE/DISCHARGE SUMMARY    Patient Name: [name] [name] MRN: [id number] DOB:1956-04-23, 66 y.o., female Today's Date: 03/02/2022  PCP: Delight Stare MD REFERRING PROVIDER: Jennings Books, MD   PT End of Session - 03/02/22 0932     Visit Number 18    Number of Visits 25    Date for PT Re-Evaluation 03/02/22    Authorization Type Medicare/ Progress note 02/02/22/recert 4/09-8/11    Authorization Time Period start of care 12/07/21    Progress Note Due on Visit 20    PT Start Time 0935    PT Stop Time 1000    PT Time Calculation (min) 25 min    Equipment Utilized During Treatment Gait belt    Activity Tolerance Patient tolerated treatment well    Behavior During Therapy WFL for tasks assessed/performed                Past Medical History:  Diagnosis Date   Actinic keratosis    Acute hepatitis C without mention of hepatic coma(070.51)    Arthritis    fingers   Carpal tunnel syndrome of left wrist    Depression    GERD (gastroesophageal reflux disease)    Hypothyroidism    Insomnia    Insomnia    Memory loss    Osteoporosis    Squamous cell carcinoma of skin 12/27/2021   Left pretibial - EDC   Tremor    Wears dentures    full upper, partial lower   Past Surgical History:  Procedure Laterality Date   ANKLE SURGERY Right    BLADDER TACT     CATARACT EXTRACTION W/PHACO Left 10/13/2021   Procedure: CATARACT EXTRACTION PHACO AND INTRAOCULAR LENS PLACEMENT ([place]) LEFT;  Surgeon: Leandrew Koyanagi, MD;  Location: [facility];  Service: Ophthalmology;  Laterality: Left;  3.97 00:49.0   CATARACT EXTRACTION W/PHACO Right 10/27/2021   Procedure: CATARACT EXTRACTION PHACO AND INTRAOCULAR LENS PLACEMENT ([place]) RIGHT 4.50 00:58.9;  Surgeon: Leandrew Koyanagi, MD;  Location: [facility];  Service: Ophthalmology;  Laterality: Right;   HAND SURGERY Right    TONSILLECTOMY     Patient Active Problem List   Diagnosis Date Noted    Hepatitis C 08/19/2016   Depression 08/19/2016   Insomnia 08/19/2016   Hypothyroidism 08/19/2016    REFERRING DIAG: Imbalance  THERAPY DIAG:  Muscle weakness (generalized)  Unsteadiness on feet  Other lack of coordination  Other abnormalities of gait and mobility  Rationale for Evaluation and Treatment Rehabilitation  PERTINENT HISTORY: 66 yo Female 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 She reports having trouble with balance and tremors. She reports falling often. She presents to therapy without AD. She has 3 walkers (apartment is too tiny and so she furniture walks). 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 Neurologist did prescribe Carvidopa/levadopa, did have side effects of nausea/vomitting. She has stopped taking medication,  but is hoping to see Neurologist again to see if she can get another medication to help control side effects. Does report numbness/tingling in LUE hand, not in the feet; PMH: arthritis in hands/fingers, GERD, insomnia(sleeps 6 hours a night), Depression (Neurologist wants her to see MD to have medications re-evaluated); tremors, Hypothyroidism; LLE knee replacement (3 years ago)   PRECAUTIONS: Fall Risk  SUBJECTIVE: Pt reports today "may be, may be not" her last day depending on her  progress with her goals. Pt reports she's been good. Denies any stumbles/falls since last visit. She has a neurologist appt this week, where she is hoping to be tested for Parkinsonism.   PAIN: Are you having pain? No  TODAY'S TREATMENT: 03/02/22   Reassessed goals today for discharge, see goal section below for further details.   SLB: R 20 sec, L 10 sec   FOTO: 56%  Stairs: ascend/descend 4 steps, no weight 1x, 4# weights in each hand 1x, holding 10# AW 2x, holding 20# 1x Cues given to descend stairs with step to  pattern when carrying weight for safety  Rising from floor: 2x, independently, pt rates as med-hard    PATIENT EDUCATION: Education details: goals, HEP, POC/discharge Person educated: Patient Education method: Explanation, Demonstration, and Verbal cues Education comprehension: verbalized understanding and returned demonstration   HOME EXERCISE PROGRAM: 03/02/22: Instructed to continue as previously given;  Access Code: [email or web address] URL: [email or web address] Date: 02/07/2022 Prepared by: Blanche East  Exercises - Standing Hip Extension with Resistance at Ankles and Counter Support  - 1 x daily - 4 x weekly - 2 sets - 10 reps - Standing Hip Abduction with Resistance at Ankles and Counter Support  - 1 x daily - 4 x weekly - 2 sets - 10 reps - Standing Hip Flexion with Resistance at Ankles and Counter Support  - 1 x daily - 4 x weekly - 2 sets - 10 reps - Side Stepping with Resistance at Ankles and Counter Support  - 1 x daily - 4 x weekly - 2 sets - 5 reps - Standing Bilateral Low Shoulder Row with Anchored Resistance  - 1 x daily - 4 x weekly - 2 sets - 15 reps - Shoulder Extension with Resistance - Palms Forward  - 1 x daily - 4 x weekly - 2 sets - 10 reps - Standing Bicep Curls with Resistance  - 1 x daily - 4 x weekly - 2 sets - 15 reps - Lunge with Counter Support  - 1 x daily - 4 x weekly - 1 sets - 10 reps   PT Short Term Goals - 12/08/21 2117       PT SHORT TERM GOAL #1   Title Patient will be adherent with HEP at least 3x a week to improve balance/strength and mobility;    Baseline 4/25: doesn't have a HEP 6/21: doing 3x a week   Time 4    Period Weeks    Status Achieved;    Target Date 01/05/22      PT SHORT TERM GOAL #2   Title Patient will be able to transfer sit<>Stand without pushing on arm rest with normal base of support, reporting minimal difficulty to improve transfer ability with ADLs.    Baseline 4/25: requires wide base of support and  reports moderate difficulty; 6/21: independent, good positioning and control   Time 4    Period Weeks    Status Achieved;    Target Date 01/05/22              PT Long Term Goals - 02/02/22 0922       PT LONG TERM GOAL #1   Title Patient will improve 10 meter walk test to >1.2 m/s without AD to return to community ambulator gait speed and reduce fall risk.    Baseline 4/25: 1.13 m/s, 6/21: 1.5 m/s    Time 8    Period Weeks    Status Achieved    Target Date  02/02/22      PT LONG TERM GOAL #2   Title Patient will improve functional gait assessment to >22/30 to indicate low risk for falls;    Baseline 4/25: 20/30, 6/21: 26/30    Time 8    Period Weeks    Status Achieved    Target Date 02/02/22      PT LONG TERM GOAL #3   Title Patient will improve FOTO score by 5% to indicate improved functional mobility with ADLs.    Baseline 4/25: 57%, 6/21: 56%; 7/19: 56%   Time 4   Period Weeks    Status Not Met    Target Date 03/02/22     PT LONG TERM GOAL #4   Title Patient will be able to hold SLS for >5 sec each LE to indicate improved functional strength for negotiating obstacles and reduce fall risk.    Baseline 4/25: R: 5 sec, L 1 sec, 6/21:6/21: R: 15+ sec, L: 2-3 sec inconsistent; 7/19: R 20 sec, L 10 sec   Time 4   Period Weeks    Status Achieved   Target Date 03/02/22     PT LONG TERM GOAL #5   Title Patient will be able to ascend/descend 12 steps while holding 20#, independently to safely carry grandkids up/down steps at home.    Baseline 6/21: able to negotiate independent without carrying weight; 7/19: 20# independently with step to when descending steps   Time 4    Period Weeks    Status Achieved   Target Date 03/02/22      Additional Long Term Goals   Additional Long Term Goals Yes      PT LONG TERM GOAL #6   Title Pt will be independent in getting down and up from the floor in able to safely care for grandkids    Baseline 6/21: reports moderate difficulty;  7/19: able to complete indep 2x   Time 4    Period Weeks    Status Achieved   Target Date 03/02/22               Plan -    Clinical Impression Statement The patient was seen to day for goal reassessment and discharge. She has met all of her goals, except FOTO where she has remained consistent. Patient is able to safely rise/lower to the floor and negotiate stairs while holding weight indicating improved QoL and independence with ADLs such as carrying her grandchildren. The patient did not feel the need to continue today's session following goal reassessment. She states that she is ready and comfortable being discharged after today and is happy with current HEP. The patient does not require any further skilled PT at this time.    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, GERD, insomnia(sleeps 6 hours a night), Depression (Neurologist wants her to see MD to have medications re-evaluated); tremors, Hypothyroidism; LLE knee replacement (3 years ago)    Examination-Activity Limitations Locomotion Level;Stairs;Stand    Examination-Participation Restrictions Cleaning;Community Activity;Shop;Volunteer;Yard Work    Stability/Clinical Decision Making Evolving/Moderate complexity    Rehab Potential Good    PT Frequency 2x / week    PT Duration 4 weeks    PT Treatment/Interventions Cryotherapy;Electrical Stimulation;Moist Heat;Gait training;Stair training;Functional mobility training;Therapeutic activities;Therapeutic exercise;Balance training;Neuromuscular re-education;Patient/family education;Energy conservation    PT Next Visit Plan N/A       Consulted and Agree with Plan of Care Patient  Izola Price, SPT This entire session was performed under direct supervision and direction of a licensed therapist. I have personally read, edited and approve of the note as written. Ricard Dillon PT, DPT  Zollie Pee,  PT 03/02/2022, 10:30 AM

## 2022-03-14 ENCOUNTER — Ambulatory Visit (INDEPENDENT_AMBULATORY_CARE_PROVIDER_SITE_OTHER): Payer: Medicare Other | Admitting: Dermatology

## 2022-03-14 DIAGNOSIS — S81802A Unspecified open wound, left lower leg, initial encounter: Secondary | ICD-10-CM | POA: Diagnosis not present

## 2022-03-14 DIAGNOSIS — L723 Sebaceous cyst: Secondary | ICD-10-CM

## 2022-03-14 DIAGNOSIS — L570 Actinic keratosis: Secondary | ICD-10-CM | POA: Diagnosis not present

## 2022-03-14 DIAGNOSIS — D492 Neoplasm of unspecified behavior of bone, soft tissue, and skin: Secondary | ICD-10-CM

## 2022-03-14 DIAGNOSIS — Z85828 Personal history of other malignant neoplasm of skin: Secondary | ICD-10-CM

## 2022-03-14 DIAGNOSIS — L304 Erythema intertrigo: Secondary | ICD-10-CM | POA: Diagnosis not present

## 2022-03-14 DIAGNOSIS — L988 Other specified disorders of the skin and subcutaneous tissue: Secondary | ICD-10-CM

## 2022-03-14 DIAGNOSIS — L820 Inflamed seborrheic keratosis: Secondary | ICD-10-CM

## 2022-03-14 DIAGNOSIS — T1490XD Injury, unspecified, subsequent encounter: Secondary | ICD-10-CM

## 2022-03-14 MED ORDER — KETOCONAZOLE 2 % EX CREA: 1.0000 | TOPICAL_CREAM | Freq: Every day | CUTANEOUS | 3 refills | Status: AC

## 2022-03-14 MED ORDER — MUPIROCIN 2 % EX OINT: 1.0000 | TOPICAL_OINTMENT | Freq: Every day | CUTANEOUS | 0 refills | Status: DC

## 2022-03-14 NOTE — Progress Notes (Signed)
Follow-Up Visit   Subjective  [name] [name] is a 66 y.o. female who presents for the following: boil with hx of draining (R inframammary, 2.5 months, hx of draining in past) and hx of SCC (L pretibial, bx and edc 12/27/21, pt said tender prn).  The patient has spots, moles and lesions to be evaluated, some may be new or changing and the patient has concerns that these could be cancer.  The following portions of the chart were reviewed this encounter and updated as appropriate:       Review of Systems:  No other skin or systemic complaints except as noted in HPI or Assessment and Plan.  Objective  Well appearing patient in no apparent distress; mood and affect are within normal limits.  A focused examination was performed including face, chest, bil lower legs. Relevant physical exam findings are noted in the Assessment and Plan.  R inferior breast 9.9m pearly pink pap central crusting     intermammary chest Erythema with maceration intermammary with associated waxy flesh papules  Left Lower Leg - Anterior Healing treatment site with crusted eschar  face Rhytides and volume loss.   L pretibia x 2 (2) Hyperkeratotic pink paps    Assessment & Plan  Neoplasm of skin R inferior breast  Skin / nail biopsy Type of biopsy: tangential   Informed consent: discussed and consent obtained   Anesthesia: the lesion was anesthetized in a standard fashion   Anesthesia comment:  Area prepped with alcohol Anesthetic:  1% lidocaine w/ epinephrine 1-100,000 buffered w/ 8.4% NaHCO3 Instrument used: flexible razor blade   Hemostasis achieved with: pressure, aluminum chloride and electrodesiccation   Outcome: patient tolerated procedure well   Post-procedure details: wound care instructions given   Post-procedure details comment:  Ointment and small bandage applied  Specimen 1 - Surgical pathology Differential Diagnosis: D48.5 Inflamed Cyst r/o BCC  Check Margins: No 9.055m pearly pink pap central crusting  Rec EDC if BCC  Intertrigo intermammary chest  With ISKs, Chronic and persistent condition with duration or expected duration over one year. Condition is bothersome/symptomatic for patient. Currently flared.   Intertrigo is a chronic recurrent rash that occurs in skin fold areas that may be associated with friction; heat; moisture; yeast; fungus; and bacteria.  It is exacerbated by increased movement / activity; sweating; and higher atmospheric temperature.  Start Ketoconazole 2% cr qhs to aa rash until clear, then prn flares  ketoconazole (NIZORAL) 2 % cream - intermammary chest Apply 1 Application topically at bedtime. Qhs to rash between breast until clear, then prn flares  Healing wound Left Lower Leg - Anterior  Hx of SCC, healing as expected  Start Mupirocin oint qd to wound and cover with bandage to soften scab  Clear. Observe for recurrence. Call clinic for new or changing lesions.  Recommend regular skin exams, daily broad-spectrum spf 30+ sunscreen use, and photoprotection.     mupirocin ointment (BACTROBAN) 2 % - Left Lower Leg - Anterior Apply 1 Application topically daily. Qd to wound on left lower leg until resolved  Elastosis of skin face  Discussed fillers, botox and BBL  Discussed the treatment option of BBL/laser.  Typically we recommend 1-3 treatment sessions about 5-8 weeks apart for best results.  The patient's condition may require "maintenance treatments" in the future.  The fee for BBL / laser treatments is $350 per treatment session for the whole face.  A fee can be quoted for other parts of the body. Insurance  typically does not pay for BBL/laser treatments and therefore the fee is an out-of-pocket cost.   Hypertrophic actinic keratosis (2) L pretibia x 2  Destruction of lesion - L pretibia x 2  Destruction method: cryotherapy   Informed consent: discussed and consent obtained   Lesion destroyed using liquid  nitrogen: Yes   Region frozen until ice ball extended beyond lesion: Yes   Outcome: patient tolerated procedure well with no complications   Post-procedure details: wound care instructions given   Additional details:  Prior to procedure, discussed risks of blister formation, small wound, skin dyspigmentation, or rare scar following cryotherapy. Recommend Vaseline ointment to treated areas while healing.    Return in about 8 weeks (around 05/09/2022) for recheck Hypertrophic AKs, and bx site.  I, Othelia Pulling, RMA, am acting as scribe for Brendolyn Patty, MD .  Documentation: I have reviewed the above documentation for accuracy and completeness, and I agree with the above.  Brendolyn Patty MD

## 2022-03-14 NOTE — Patient Instructions (Addendum)
Wound Care Instructions  Cleanse wound gently with soap and water once a day then pat dry with clean gauze. Apply a thing coat of Petrolatum (petroleum jelly, "Vaseline") over the wound (unless you have an allergy to this). We recommend that you use a new, sterile tube of Vaseline. Do not pick or remove scabs. Do not remove the yellow or white "healing tissue" from the base of the wound.  Cover the wound with fresh, clean, nonstick gauze and secure with paper tape. You may use Band-Aids in place of gauze and tape if the would is small enough, but would recommend trimming much of the tape off as there is often too much. Sometimes Band-Aids can irritate the skin.  You should call the office for your biopsy report after 1 week if you have not already been contacted.  If you experience any problems, such as abnormal amounts of bleeding, swelling, significant bruising, significant pain, or evidence of infection, please call the office immediately.  FOR ADULT SURGERY PATIENTS: If you need something for pain relief you may take 1 extra strength Tylenol (acetaminophen) AND 2 Ibuprofen ('200mg'$  each) together every 4 hours as needed for pain. (do not take these if you are allergic to them or if you have a reason you should not take them.) Typically, you may only need pain medication for 1 to 3 days.    Cryotherapy Aftercare  Wash gently with soap and water everyday.   Apply Vaseline and Band-Aid daily until healed.     Due to recent changes in healthcare laws, you may see results of your pathology and/or laboratory studies on MyChart before the doctors have had a chance to review them. We understand that in some cases there may be results that are confusing or concerning to you. Please understand that not all results are received at the same time and often the doctors may need to interpret multiple results in order to provide you with the best plan of care or course of treatment. Therefore, we ask that you  please give [place] 2 business days to thoroughly review all your results before contacting the office for clarification. Should we see a critical lab result, you will be contacted sooner.   If You Need Anything After Your Visit  If you have any questions or concerns for your doctor, please call our main line at [phone] and press option 4 to reach your doctor's medical assistant. If no one answers, please leave a voicemail as directed and we will return your call as soon as possible. Messages left after 4 pm will be answered the following business day.   You may also send [place] a message via [facility]. We typically respond to MyChart messages within 1-2 business days.  For prescription refills, please ask your pharmacy to contact our office. Our fax number is [phone].  If you have an urgent issue when the clinic is closed that cannot wait until the next business day, you can page your doctor at the number below.    Please note that while we do our best to be available for urgent issues outside of office hours, we are not available 24/7.   If you have an urgent issue and are unable to reach [place], you may choose to seek medical care at your doctor's office, retail clinic, urgent care center, or emergency room.  If you have a medical emergency, please immediately call 911 or go to the emergency department.  Pager Numbers  - Dr. Nehemiah Massed: [phone]  -  Dr. Moye: [phone]  - Dr. Stewart: [phone]  In the event of inclement weather, please call our main line at [phone] for an update on the status of any delays or closures.  Dermatology Medication Tips: Please keep the boxes that topical medications come in in order to help keep track of the instructions about where and how to use these. Pharmacies typically print the medication instructions only on the boxes and not directly on the medication tubes.   If your medication is too expensive, please contact our office at  [phone] option 4 or send us a message through MyChart.   We are unable to tell what your co-pay for medications will be in advance as this is different depending on your insurance coverage. However, we may be able to find a substitute medication at lower cost or fill out paperwork to get insurance to cover a needed medication.   If a prior authorization is required to get your medication covered by your insurance company, please allow us 1-2 business days to complete this process.  Drug prices often vary depending on where the prescription is filled and some pharmacies may offer cheaper prices.  The website www.goodrx.com contains coupons for medications through different pharmacies. The prices here do not account for what the cost may be with help from insurance (it may be cheaper with your insurance), but the website can give you the price if you did not use any insurance.  - You can print the associated coupon and take it with your prescription to the pharmacy.  - You may also stop by our office during regular business hours and pick up a GoodRx coupon card.  - If you need your prescription sent electronically to a different pharmacy, notify our office through [facility] MyChart or by phone at [phone] option 4.     Si Usted Necesita Algo Despus de Su Visita  Tambin puede enviarnos un mensaje a travs de MyChart. Por lo general respondemos a los mensajes de MyChart en el transcurso de 1 a 2 das hbiles.  Para renovar recetas, por favor pida a su farmacia que se ponga en contacto con nuestra oficina. Nuestro nmero de fax es el [phone].  Si tiene un asunto urgente cuando la clnica est cerrada y que no puede esperar hasta el siguiente da hbil, puede llamar/localizar a su doctor(a) al nmero que aparece a continuacin.   Por favor, tenga en cuenta que aunque hacemos todo lo posible para estar disponibles para asuntos urgentes fuera del horario de oficina, no estamos  disponibles las 24 horas del da, los 7 das de la semana.   Si tiene un problema urgente y no puede comunicarse con nosotros, puede optar por buscar atencin mdica  en el consultorio de su doctor(a), en una clnica privada, en un centro de atencin urgente o en una sala de emergencias.  Si tiene una emergencia mdica, por favor llame inmediatamente al 911 o vaya a la sala de emergencias.  Nmeros de bper  - Dr. Kowalski: [phone]  - Dra. Moye: [phone]  - Dra. Stewart: [phone]  En caso de inclemencias del tiempo, por favor llame a nuestra lnea principal al [phone] para una actualizacin sobre el estado de cualquier retraso o cierre.  Consejos para la medicacin en dermatologa: Por favor, guarde las cajas en las que vienen los medicamentos de uso tpico para ayudarle a seguir las instrucciones sobre dnde y cmo usarlos. Las farmacias generalmente imprimen las instrucciones del medicamento slo en las cajas y   no directamente en los tubos del medicamento.   Si su medicamento es muy caro, por favor, pngase en contacto con nuestra oficina llamando al [phone] y presione la opcin 4 o envenos un mensaje a travs de MyChart.   No podemos decirle cul ser su copago por los medicamentos por adelantado ya que esto es diferente dependiendo de la cobertura de su seguro. Sin embargo, es posible que podamos encontrar un medicamento sustituto a menor costo o llenar un formulario para que el seguro cubra el medicamento que se considera necesario.   Si se requiere una autorizacin previa para que su compaa de seguros cubra su medicamento, por favor permtanos de 1 a 2 das hbiles para completar este proceso.  Los precios de los medicamentos varan con frecuencia dependiendo del lugar de dnde se surte la receta y alguna farmacias pueden ofrecer precios ms baratos.  El sitio web www.goodrx.com tiene cupones para medicamentos de diferentes farmacias. Los precios aqu no  tienen en cuenta lo que podra costar con la ayuda del seguro (puede ser ms barato con su seguro), pero el sitio web puede darle el precio si no utiliz ningn seguro.  - Puede imprimir el cupn correspondiente y llevarlo con su receta a la farmacia.  - Tambin puede pasar por nuestra oficina durante el horario de atencin regular y recoger una tarjeta de cupones de GoodRx.  - Si necesita que su receta se enve electrnicamente a una farmacia diferente, informe a nuestra oficina a travs de MyChart de [facility] o por telfono llamando al [phone] y presione la opcin 4.  

## 2022-03-21 ENCOUNTER — Telehealth: Payer: Self-pay

## 2022-03-21 NOTE — Telephone Encounter (Signed)
Left message advising patient of biopsy results. She will call with any questions or concerns.

## 2022-03-21 NOTE — Telephone Encounter (Signed)
-----   Message from Brendolyn Patty, MD sent at 03/21/2022  9:44 AM EDT ----- Skin , right inferior breast VERRUCOUS CYST, INFLAMED, TRAUMATIZED  Benign, inflamed cyst with wart, If recurs, consider EDC.  - please call patient

## 2022-05-09 ENCOUNTER — Ambulatory Visit (INDEPENDENT_AMBULATORY_CARE_PROVIDER_SITE_OTHER): Payer: Medicare Other | Admitting: Dermatology

## 2022-05-09 DIAGNOSIS — L578 Other skin changes due to chronic exposure to nonionizing radiation: Secondary | ICD-10-CM | POA: Diagnosis not present

## 2022-05-09 DIAGNOSIS — L820 Inflamed seborrheic keratosis: Secondary | ICD-10-CM | POA: Diagnosis not present

## 2022-05-09 DIAGNOSIS — L570 Actinic keratosis: Secondary | ICD-10-CM

## 2022-05-09 DIAGNOSIS — L814 Other melanin hyperpigmentation: Secondary | ICD-10-CM | POA: Diagnosis not present

## 2022-05-09 DIAGNOSIS — Z872 Personal history of diseases of the skin and subcutaneous tissue: Secondary | ICD-10-CM | POA: Diagnosis not present

## 2022-05-09 DIAGNOSIS — L821 Other seborrheic keratosis: Secondary | ICD-10-CM

## 2022-05-09 NOTE — Patient Instructions (Addendum)
Recommend starting moisturizer with exfoliant (Urea, Salicylic acid, or Lactic acid) one to two times daily to help smooth rough and bumpy skin.  OTC options include Cetaphil Rough and Bumpy lotion (Urea), Eucerin Roughness Relief lotion or spot treatment cream (Urea), CeraVe SA lotion/cream for Rough and Bumpy skin (Sal Acid), Gold Bond Rough and Bumpy cream (Sal Acid), and AmLactin 12% lotion/cream (Lactic Acid).  If applying in morning, also apply sunscreen to sun-exposed areas, since these exfoliating moisturizers can increase sensitivity to sun.  Cryotherapy Aftercare  Wash gently with soap and water everyday.   Apply Vaseline and Band-Aid daily until healed.   Due to recent changes in healthcare laws, you may see results of your pathology and/or laboratory studies on MyChart before the doctors have had a chance to review them. We understand that in some cases there may be results that are confusing or concerning to you. Please understand that not all results are received at the same time and often the doctors may need to interpret multiple results in order to provide you with the best plan of care or course of treatment. Therefore, we ask that you please give [place] 2 business days to thoroughly review all your results before contacting the office for clarification. Should we see a critical lab result, you will be contacted sooner.   If You Need Anything After Your Visit  If you have any questions or concerns for your doctor, please call our main line at [phone] and press option 4 to reach your doctor's medical assistant. If no one answers, please leave a voicemail as directed and we will return your call as soon as possible. Messages left after 4 pm will be answered the following business day.   You may also send [place] a message via [place]. We typically respond to MyChart messages within 1-2 business days.  For prescription refills, please ask your pharmacy to contact our office. Our fax number  is [phone].  If you have an urgent issue when the clinic is closed that cannot wait until the next business day, you can page your doctor at the number below.    Please note that while we do our best to be available for urgent issues outside of office hours, we are not available 24/7.   If you have an urgent issue and are unable to reach [place], you may choose to seek medical care at your doctor's office, retail clinic, urgent care center, or emergency room.  If you have a medical emergency, please immediately call 911 or go to the emergency department.  Pager Numbers  - Dr. Nehemiah Massed: [phone]  - Dr. Laurence Ferrari: [phone]  - Dr. Nicole Kindred: [phone]  In the event of inclement weather, please call our main line at [phone] for an update on the status of any delays or closures.  Dermatology Medication Tips: Please keep the boxes that topical medications come in in order to help keep track of the instructions about where and how to use these. Pharmacies typically print the medication instructions only on the boxes and not directly on the medication tubes.   If your medication is too expensive, please contact our office at [phone] option 4 or send [place] a message through [place].   We are unable to tell what your co-pay for medications will be in advance as this is different depending on your insurance coverage. However, we may be able to find a substitute medication at lower cost or fill out paperwork to get insurance to cover a needed  needed medication.   If a prior authorization is required to get your medication covered by your insurance company, please allow us 1-2 business days to complete this process.  Drug prices often vary depending on where the prescription is filled and some pharmacies may offer cheaper prices.  The website www.goodrx.com contains coupons for medications through different pharmacies. The prices here do not account for what the cost may be with help from  insurance (it may be cheaper with your insurance), but the website can give you the price if you did not use any insurance.  - You can print the associated coupon and take it with your prescription to the pharmacy.  - You may also stop by our office during regular business hours and pick up a GoodRx coupon card.  - If you need your prescription sent electronically to a different pharmacy, notify our office through [place] MyChart or by phone at [phone] option 4.     Si Usted Necesita Algo Despus de Su Visita  Tambin puede enviarnos un mensaje a travs de MyChart. Por lo general respondemos a los mensajes de MyChart en el transcurso de 1 a 2 das hbiles.  Para renovar recetas, por favor pida a su farmacia que se ponga en contacto con nuestra oficina. Nuestro nmero de fax es el [phone].  Si tiene un asunto urgente cuando la clnica est cerrada y que no puede esperar hasta el siguiente da hbil, puede llamar/localizar a su doctor(a) al nmero que aparece a continuacin.   Por favor, tenga en cuenta que aunque hacemos todo lo posible para estar disponibles para asuntos urgentes fuera del horario de oficina, no estamos disponibles las 24 horas del da, los 7 das de la semana.   Si tiene un problema urgente y no puede comunicarse con nosotros, puede optar por buscar atencin mdica  en el consultorio de su doctor(a), en una clnica privada, en un centro de atencin urgente o en una sala de emergencias.  Si tiene una emergencia mdica, por favor llame inmediatamente al 911 o vaya a la sala de emergencias.  Nmeros de bper  - Dr. Kowalski: [phone]  - Dra. Moye: [phone]  - Dra. Stewart: [phone]  En caso de inclemencias del tiempo, por favor llame a nuestra lnea principal al [phone] para una actualizacin sobre el estado de cualquier retraso o cierre.  Consejos para la medicacin en dermatologa: Por favor, guarde las cajas en las que vienen los  medicamentos de uso tpico para ayudarle a seguir las instrucciones sobre dnde y cmo usarlos. Las farmacias generalmente imprimen las instrucciones del medicamento slo en las cajas y no directamente en los tubos del medicamento.   Si su medicamento es muy caro, por favor, pngase en contacto con nuestra oficina llamando al [phone] y presione la opcin 4 o envenos un mensaje a travs de MyChart.   No podemos decirle cul ser su copago por los medicamentos por adelantado ya que esto es diferente dependiendo de la cobertura de su seguro. Sin embargo, es posible que podamos encontrar un medicamento sustituto a menor costo o llenar un formulario para que el seguro cubra el medicamento que se considera necesario.   Si se requiere una autorizacin previa para que su compaa de seguros cubra su medicamento, por favor permtanos de 1 a 2 das hbiles para completar este proceso.  Los precios de los medicamentos varan con frecuencia dependiendo del lugar de dnde se surte la receta y alguna farmacias pueden ofrecer precios ms baratos.    sitio web www.goodrx.com tiene cupones para medicamentos de Airline pilot. Los precios aqu no tienen en cuenta lo que podra costar con la ayuda del seguro (puede ser ms barato con su seguro), pero el sitio web puede darle el precio si no utiliz Research scientist (physical sciences).  - Puede imprimir el cupn correspondiente y llevarlo con su receta a la farmacia.  - Tambin puede pasar por nuestra oficina durante el horario de atencin regular y Charity fundraiser una tarjeta de cupones de GoodRx.  - Si necesita que su receta se enve electrnicamente a una farmacia diferente, informe a nuestra oficina a travs de MyChart de [place] o por telfono llamando al [phone] y presione la opcin 4.

## 2022-05-09 NOTE — Progress Notes (Signed)
Follow-Up Visit   Subjective  [name] [name] is a 66 y.o. female who presents for the following: Follow-up (Patient here today for 6 week AK and BX follow up. AK's treated at last visit with LN2 at left pretibia. BX done at right inferior breast showed verrucous cyst. ). Both are clear.  Patient with scaly spots that sometimes itch at arms and legs.  The following portions of the chart were reviewed this encounter and updated as appropriate:       Review of Systems:  No other skin or systemic complaints except as noted in HPI or Assessment and Plan.  Objective  Well appearing patient in no apparent distress; mood and affect are within normal limits.  A focused examination was performed including arms, legs, breast. Relevant physical exam findings are noted in the Assessment and Plan. R inferior breast bx site is clear.  right lower leg x 7, left lower leg x 6, right forearm x 1 (14) Erythematous thin papules/macules with gritty scale.   right upper calf x 1 Erythematous stuck-on, waxy papule    Assessment & Plan  AK (actinic keratosis) (14) right lower leg x 7, left lower leg x 6, right forearm x 1  Actinic keratoses are precancerous spots that appear secondary to cumulative UV radiation exposure/sun exposure over time. They are chronic with expected duration over 1 year. A portion of actinic keratoses will progress to squamous cell carcinoma of the skin. It is not possible to reliably predict which spots will progress to skin cancer and so treatment is recommended to prevent development of skin cancer.  Recommend daily broad spectrum sunscreen SPF 30+ to sun-exposed areas, reapply every 2 hours as needed.  Recommend staying in the shade or wearing long sleeves, sun glasses (UVA+UVB protection) and wide brim hats (4-inch brim around the entire circumference of the hat). Call for new or changing lesions.  Hypertrophic Vs ISK  Destruction of lesion - right lower leg x 7, left  lower leg x 6, right forearm x 1  Destruction method: cryotherapy   Informed consent: discussed and consent obtained   Lesion destroyed using liquid nitrogen: Yes   Region frozen until ice ball extended beyond lesion: Yes   Outcome: patient tolerated procedure well with no complications   Post-procedure details: wound care instructions given   Additional details:  Prior to procedure, discussed risks of blister formation, small wound, skin dyspigmentation, or rare scar following cryotherapy. Recommend Vaseline ointment to treated areas while healing.   Inflamed seborrheic keratosis right upper calf x 1  Symptomatic, irritating, patient would like treated.   Destruction of lesion - right upper calf x 1  Destruction method: cryotherapy   Informed consent: discussed and consent obtained   Lesion destroyed using liquid nitrogen: Yes   Region frozen until ice ball extended beyond lesion: Yes   Outcome: patient tolerated procedure well with no complications   Post-procedure details: wound care instructions given   Additional details:  Prior to procedure, discussed risks of blister formation, small wound, skin dyspigmentation, or rare scar following cryotherapy. Recommend Vaseline ointment to treated areas while healing.    History of Squamous Cell Carcinoma of the Skin - No evidence of recurrence today at left pretibia - Recommend regular full body skin exams - Recommend daily broad spectrum sunscreen SPF 30+ to sun-exposed areas, reapply every 2 hours as needed.  - Call if any new or changing lesions are noted between office visits  History of PreCancerous Actinic Keratosis  -  site(s) of PreCancerous Actinic Keratosis clear today. - these may recur and new lesions may form requiring treatment to prevent transformation into skin cancer - observe for new or changing spots and contact [place] for appointment if occur - photoprotection with sun protective clothing; sunglasses  and broad spectrum sunscreen with SPF of at least 30 + and frequent self skin exams recommended - yearly exams by a dermatologist recommended for persons with history of PreCancerous Actinic Keratoses  Seborrheic Keratoses - Stuck-on, waxy, tan-brown papules and/or plaques  - Benign-appearing - Discussed benign etiology and prognosis. - Observe - Call for any changes  Lentigines - Scattered tan macules - Due to sun exposure - Benign-appearing, observe - Recommend daily broad spectrum sunscreen SPF 30+ to sun-exposed areas, reapply every 2 hours as needed. - Call for any changes  Actinic Damage - chronic, secondary to cumulative UV radiation exposure/sun exposure over time - diffuse scaly erythematous macules with underlying dyspigmentation - Recommend daily broad spectrum sunscreen SPF 30+ to sun-exposed areas, reapply every 2 hours as needed.  - Recommend staying in the shade or wearing long sleeves, sun glasses (UVA+UVB protection) and wide brim hats (4-inch brim around the entire circumference of the hat). - Call for new or changing lesions.  Return in about 6 months (around 11/07/2022) for TBSE.  Graciella Belton, RMA, am acting as scribe for Brendolyn Patty, MD .  Documentation: I have reviewed the above documentation for accuracy and completeness, and I agree with the above.  Brendolyn Patty MD

## 2022-05-18 ENCOUNTER — Other Ambulatory Visit: Payer: Self-pay | Admitting: Internal Medicine

## 2022-05-18 DIAGNOSIS — Z1231 Encounter for screening mammogram for malignant neoplasm of breast: Secondary | ICD-10-CM

## 2022-05-27 ENCOUNTER — Ambulatory Visit
Admission: RE | Admit: 2022-05-27 | Discharge: 2022-05-27 | Disposition: A | Discharge status: Home / Self Care | Payer: Medicare Other | Source: Ambulatory Visit | Attending: Internal Medicine | Admitting: Internal Medicine

## 2022-05-27 DIAGNOSIS — Z1231 Encounter for screening mammogram for malignant neoplasm of breast: Secondary | ICD-10-CM

## 2022-06-01 ENCOUNTER — Other Ambulatory Visit: Payer: Self-pay | Admitting: Internal Medicine

## 2022-06-01 DIAGNOSIS — M810 Age-related osteoporosis without current pathological fracture: Secondary | ICD-10-CM

## 2022-06-03 ENCOUNTER — Ambulatory Visit
Admission: RE | Admit: 2022-06-03 | Discharge: 2022-06-03 | Disposition: A | Discharge status: Home / Self Care | Payer: Medicare Other | Source: Ambulatory Visit | Attending: Internal Medicine | Admitting: Internal Medicine

## 2022-06-03 DIAGNOSIS — M810 Age-related osteoporosis without current pathological fracture: Secondary | ICD-10-CM | POA: Insufficient documentation

## 2022-06-29 ENCOUNTER — Ambulatory Visit: Payer: Medicare Other | Admitting: Dermatology

## 2022-09-12 NOTE — Therapy (Deleted)
OUTPATIENT PHYSICAL THERAPY NEURO EVALUATION   Patient Name: [name] [name] MRN: [id number] DOB:1955/08/21, 67 y.o., female Today's Date: 09/12/2022   PCP: Waylan Rocher, MD  REFERRING PROVIDER: Vladimir Crofts, MD   END OF SESSION:   Past Medical History:  Diagnosis Date   Actinic keratosis    Acute hepatitis C without mention of hepatic coma(070.51)    Arthritis    fingers   Carpal tunnel syndrome of left wrist    Depression    GERD (gastroesophageal reflux disease)    Hypothyroidism    Insomnia    Insomnia    Memory loss    Osteoporosis    Squamous cell carcinoma of skin 12/27/2021   Left pretibial - EDC   Tremor    Wears dentures    full upper, partial lower   Past Surgical History:  Procedure Laterality Date   ANKLE SURGERY Right    BLADDER TACT     CATARACT EXTRACTION W/PHACO Left 10/13/2021   Procedure: CATARACT EXTRACTION PHACO AND INTRAOCULAR LENS PLACEMENT ([place]) LEFT;  Surgeon: Leandrew Koyanagi, MD;  Location: [facility];  Service: Ophthalmology;  Laterality: Left;  3.97 00:49.0   CATARACT EXTRACTION W/PHACO Right 10/27/2021   Procedure: CATARACT EXTRACTION PHACO AND INTRAOCULAR LENS PLACEMENT ([place]) RIGHT 4.50 00:58.9;  Surgeon: Leandrew Koyanagi, MD;  Location: [facility];  Service: Ophthalmology;  Laterality: Right;   HAND SURGERY Right    TONSILLECTOMY     Patient Active Problem List   Diagnosis Date Noted   Hepatitis C 08/19/2016   Depression 08/19/2016   Insomnia 08/19/2016   Hypothyroidism 08/19/2016    ONSET DATE: ***  REFERRING DIAG: R26.9 (ICD-10-CM) - Abnormality of gait and mobility   THERAPY DIAG:  No diagnosis found.  Rationale for Evaluation and Treatment: Rehabilitation  SUBJECTIVE:                                                                                                                                                                                             SUBJECTIVE STATEMENT: Changes  since last time?  More falls?  Taking PD meds now?  Ide effects?   Pt accompanied by: {accompnied:27141}  PERTINENT HISTORY: ***  PAIN:  Are you having pain? {OPRCPAIN:27236}  PRECAUTIONS: {Therapy precautions:24002}  WEIGHT BEARING RESTRICTIONS: {Yes ***/No:24003}  FALLS: Has patient fallen in last 6 months? {fallsyesno:27318}  LIVING ENVIRONMENT: Lives with: {OPRC lives with:25569::"lives with their family"} Lives in: {Lives in:25570} Stairs: {opstairs:27293} Has following equipment at home: {Assistive devices:23999}  PLOF: {PLOF:24004}  PATIENT GOALS: ***  OBJECTIVE:   DIAGNOSTIC FINDINGS: ***  COGNITION: Overall cognitive status: {cognition:24006}  SENSATION: {sensation:27233}  COORDINATION: ***  EDEMA:  {edema:24020}  MUSCLE TONE: {LE tone:25568}  MUSCLE LENGTH: Hamstrings: Right *** deg; Left *** deg Marcello Moores test: Right *** deg; Left *** deg  DTRs:  {DTR SITE:24025}  POSTURE: {posture:25561}  LOWER EXTREMITY ROM:     {AROM/PROM:27142}  Right Eval Left Eval  Hip flexion    Hip extension    Hip abduction    Hip adduction    Hip internal rotation    Hip external rotation    Knee flexion    Knee extension    Ankle dorsiflexion    Ankle plantarflexion    Ankle inversion    Ankle eversion     (Blank rows = not tested)  LOWER EXTREMITY MMT:    MMT Right Eval Left Eval  Hip flexion    Hip extension    Hip abduction    Hip adduction    Hip internal rotation    Hip external rotation    Knee flexion    Knee extension    Ankle dorsiflexion    Ankle plantarflexion    Ankle inversion    Ankle eversion    (Blank rows = not tested)  BED MOBILITY:  {Bed mobility:24027}  TRANSFERS: Assistive device utilized: {Assistive devices:23999}  Sit to stand: {Levels of assistance:24026} Stand to sit: {Levels of assistance:24026} Chair to chair: {Levels of assistance:24026} Floor: {Levels of assistance:24026}  RAMP:  Level of  Assistance: {Levels of assistance:24026} Assistive device utilized: {Assistive devices:23999} Ramp Comments: ***  CURB:  Level of Assistance: {Levels of assistance:24026} Assistive device utilized: {Assistive devices:23999} Curb Comments: ***  STAIRS: Level of Assistance: {Levels of assistance:24026} Stair Negotiation Technique: {Stair Technique:27161} with {Rail Assistance:27162} Number of Stairs: ***  Height of Stairs: ***  Comments: ***  GAIT: Gait pattern: {gait characteristics:25376} Distance walked: *** Assistive device utilized: {Assistive devices:23999} Level of assistance: {Levels of assistance:24026} Comments: ***  FUNCTIONAL TESTS:  {Functional tests:24029}  PATIENT SURVEYS:  {rehab surveys:24030}  TODAY'S TREATMENT:                                                                                                                              DATE: ***    PATIENT EDUCATION: Education details: *** Person educated: {Person educated:25204} Education method: {Education Method:25205} Education comprehension: {Education Comprehension:25206}  HOME EXERCISE PROGRAM: From previous PT sessions, assess if still doing regularly   Access Code: [email or web address] URL: [email or web address]  GOALS: Goals reviewed with patient? {yes/no:20286}  SHORT TERM GOALS: Target date: 10/10/2022    *** Baseline: Goal status: {GOALSTATUS:25110}  2.  *** Baseline:  Goal status: {GOALSTATUS:25110}  3.  *** Baseline:  Goal status: {GOALSTATUS:25110}  4.  *** Baseline:  Goal status: {GOALSTATUS:25110}  5.  *** Baseline:  Goal status: {GOALSTATUS:25110}  6.  *** Baseline:  Goal status: {GOALSTATUS:25110}  LONG TERM GOALS: Target date: ***  ***.  Patient (> 81 years old) will complete five times sit to stand test in < 15 seconds  indicating an increased LE strength and improved balance. Baseline: *** Goal status: INITIAL  ***.  Patient will increase FOTO  score to equal to or greater than  ***   to demonstrate statistically significant improvement in mobility and quality of life.  Baseline: *** Goal status: INITIAL   ***.  Patient will increase Berg Balance score by > 6 points to demonstrate decreased fall risk during functional activities. Baseline: *** Goal status: INITIAL   ***.   Patient will reduce timed up and go to <11 seconds to reduce fall risk and demonstrate improved transfer/gait ability. Baseline: *** Goal status: INITIAL  ***.   Patient will increase 10 meter walk test to >1.44ms as to improve gait speed for better community ambulation and to reduce fall risk. Baseline: *** Goal status: INITIAL  ***.   Patient will increase six minute walk test distance to >1000 for progression to community ambulator and improve gait ability Baseline: *** Goal status: INITIAL    ASSESSMENT:  CLINICAL IMPRESSION: Patient is a *** y.o. *** who was seen today for physical therapy evaluation and treatment for ***.   OBJECTIVE IMPAIRMENTS: {opptimpairments:25111}.   ACTIVITY LIMITATIONS: {activitylimitations:27494}  PARTICIPATION LIMITATIONS: {participationrestrictions:25113}  PERSONAL FACTORS: {Personal factors:25162} are also affecting patient's functional outcome.   REHAB POTENTIAL: Good  CLINICAL DECISION MAKING: Stable/uncomplicated  EVALUATION COMPLEXITY: Low  PLAN:  PT FREQUENCY: {rehab frequency:25116}  PT DURATION: {rehab duration:25117}  PLANNED INTERVENTIONS: Therapeutic exercises, Therapeutic activity, Neuromuscular re-education, Balance training, Gait training, Patient/Family education, Self Care, Joint mobilization, Stair training, Vestibular training, Dry Needling, Moist heat, and Manual therapy  PLAN FOR NEXT SESSION: ***   CParticia Lather PT 09/12/2022, 9:34 AM

## 2022-09-13 ENCOUNTER — Ambulatory Visit: Payer: 59 | Admitting: Physical Therapy

## 2022-09-15 ENCOUNTER — Ambulatory Visit: Payer: 59 | Attending: Neurology | Admitting: Physical Therapy

## 2022-09-15 DIAGNOSIS — R2689 Other abnormalities of gait and mobility: Secondary | ICD-10-CM | POA: Diagnosis present

## 2022-09-15 DIAGNOSIS — R2681 Unsteadiness on feet: Secondary | ICD-10-CM | POA: Diagnosis present

## 2022-09-15 DIAGNOSIS — R278 Other lack of coordination: Secondary | ICD-10-CM | POA: Insufficient documentation

## 2022-09-15 DIAGNOSIS — M6281 Muscle weakness (generalized): Secondary | ICD-10-CM | POA: Insufficient documentation

## 2022-09-15 NOTE — Therapy (Signed)
OUTPATIENT PHYSICAL THERAPY NEURO EVALUATION   Patient Name: [name] [name] MRN: [id number] DOB:09-08-1955, 67 y.o., female Today's Date: 09/15/2022   PCP: Waylan Rocher, MD  REFERRING PROVIDER: Vladimir Crofts, MD   END OF SESSION:  PT End of Session - 09/15/22 1349     Visit Number 1    Number of Visits 24    Date for PT Re-Evaluation 11/10/22    Progress Note Due on Visit 10    PT Start Time 1100    PT Stop Time 1147    PT Time Calculation (min) 47 min    Activity Tolerance Patient tolerated treatment well;Patient limited by pain   knee pain on the right            Past Medical History:  Diagnosis Date   Actinic keratosis    Acute hepatitis C without mention of hepatic coma(070.51)    Arthritis    fingers   Carpal tunnel syndrome of left wrist    Depression    GERD (gastroesophageal reflux disease)    Hypothyroidism    Insomnia    Insomnia    Memory loss    Osteoporosis    Squamous cell carcinoma of skin 12/27/2021   Left pretibial - [facility]   Tremor    Wears dentures    full upper, partial lower   Past Surgical History:  Procedure Laterality Date   ANKLE SURGERY Right    BLADDER TACT     CATARACT EXTRACTION W/PHACO Left 10/13/2021   Procedure: CATARACT EXTRACTION PHACO AND INTRAOCULAR LENS PLACEMENT ([place]) LEFT;  Surgeon: Leandrew Koyanagi, MD;  Location: [facility];  Service: Ophthalmology;  Laterality: Left;  3.97 00:49.0   CATARACT EXTRACTION W/PHACO Right 10/27/2021   Procedure: CATARACT EXTRACTION PHACO AND INTRAOCULAR LENS PLACEMENT ([place]) RIGHT 4.50 00:58.9;  Surgeon: Leandrew Koyanagi, MD;  Location: [facility];  Service: Ophthalmology;  Laterality: Right;   HAND SURGERY Right    TONSILLECTOMY     Patient Active Problem List   Diagnosis Date Noted   Hepatitis C 08/19/2016   Depression 08/19/2016   Insomnia 08/19/2016   Hypothyroidism 08/19/2016    ONSET DATE: 04/14/22  REFERRING DIAG: R26.9 (ICD-10-CM) -  Abnormality of gait and mobility   THERAPY DIAG:  Unsteadiness on feet  Other abnormalities of gait and mobility  Rationale for Evaluation and Treatment: Rehabilitation  SUBJECTIVE:                                                                                                                                                                                             SUBJECTIVE STATEMENT: Changes since  last time: Pt reports since last time she was seen here for therapy she has lost her balance quite a few times. Pt reports she loses her balance about once week. 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 Pt reports her last fall pt was at home and lost her balance and fell backwards. Prior to that she fell outside when there was soft and uneven ground she was not able to accommodate for. Pt reports she lost the exercises she had been discharged with from therapy. In addition pt is having new onset of right knee pain and feels she may need to get it replaced as she got other knee replaced in the past. Pt is familiar to this clinic and has been treated here for similar problems in the past.  Pt accompanied by: self  PERTINENT HISTORY: Changes since last time: Pt reports since last time she was seen here for therapy she has lost her balance quite a few times. Pt reports she loses her balance about once week. 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 Pt reports her last fall pt was at home and lost her balance and fell backwards. Prior to that she fell outside when there was soft and uneven ground she was not able to accommodate for. Pt reports she lost the exercises she had been discharged with from therapy. In addition pt is having new onset of right knee pain and feels she may need to get it replaced as she got other knee replaced in the past. Pt is familiar to this clinic  and has been treated here for similar problems in the past.  PAIN:  Are you having pain? Yes: NPRS scale: 6/10 Pain location: R knee  Pain description: Aching pain  Aggravating factors: Nothing to note  Relieving factors: Nothing to note   PRECAUTIONS: Fall  WEIGHT BEARING RESTRICTIONS: No  FALLS: Has patient fallen in last 6 months? Yes. Number of falls 8-10  LIVING ENVIRONMENT: Lives with: lives with their family Lives in: House/apartment Stairs: Yes: External: 2 steps; can reach both Has following equipment at home:  walking stick   PLOF: Independent  PATIENT GOALS: Get up from floor better, get up from chairs better, improve balance, decrease falls.   OBJECTIVE:   DIAGNOSTIC FINDINGS: N/a  COGNITION: Overall cognitive status: Within functional limits for tasks assessed   SENSATION: Not tested  COORDINATION: WNL  EDEMA:  None    POSTURE: rounded shoulders  LOWER EXTREMITY ROM:   WNL for tasks assessed. May want to [facility] at right knee valgus during ambulation.   AROM  Right Eval Left Eval  Hip flexion    Hip extension    Hip abduction    Hip adduction    Hip internal rotation    Hip external rotation    Knee flexion    Knee extension    Ankle dorsiflexion    Ankle plantarflexion    Ankle inversion    Ankle eversion     (Blank rows = not tested)  LOWER EXTREMITY MMT:    MMT Right Eval Left Eval  Hip flexion 4 4  Hip extension    Hip abduction 5 5  Hip adduction 5 5  Hip internal rotation    Hip external rotation    Knee flexion 4* 5  Knee extension 4* 5  Ankle dorsiflexion 5 5  Ankle plantarflexion 5 5  Ankle inversion    Ankle eversion    (Blank rows = not tested) Asterisks indicated pain in this case knee pain with testing   BED MOBILITY:  Sit to supine Complete Independence Supine to sit Complete Independence Rolling to Right Complete Independence Rolling to Left Complete Independence F  TRANSFERS: Assistive device  utilized: None  Sit to stand: Complete Independence Stand to sit: Complete Independence Chair to chair: Complete Independence Floor:  Increased time    CURB:  Level of Assistance: Complete Independence Assistive device utilized: None Curb Comments:   STAIRS: Level of Assistance: Modified independence Stair Negotiation Technique: Alternating Pattern  with Single Rail on Right Number of Stairs: 4  Height of Stairs: 6 in  Comments: decreased speed going down stairs  GAIT: Gait pattern:  genu recarvatum on the r knee  Distance walked: 1180 Assistive device utilized: None Level of assistance: Complete Independence Comments: decreased distance coin 6MWT compared to age matched norms   FUNCTIONAL TESTS:  5 times sit to stand: 17.3 sec  Timed up and go (TUG): 13.67 6 minute walk test: 1180 Dynamic Gait Index: 19   PATIENT SURVEYS:  ABC scale 83.125 % FOTO 59    TODAY'S TREATMENT:                                                                                                                              DATE: 09/15/22 Eval only     PATIENT EDUCATION: Education details: POC, test results  Person educated: Patient Education method: Explanation Education comprehension: verbalized understanding  HOME EXERCISE PROGRAM: From previous PT sessions, assess if still doing regularly   Access Code: [email or web address] URL: [email or web address]  GOALS: Goals reviewed with patient? Yes  SHORT TERM GOALS: Target date: 10/10/2022     Patient will be independent in home exercise program to improve strength/mobility for better functional independence with ADLs. Baseline: No HEP currently, lost HEP from last therapy  Goal status: INITIAL   LONG TERM GOALS: Target date: 11/10/2022    1.  Patient (> 70 years old) will complete five times sit to stand test in < 15 seconds indicating an increased LE strength and improved balance. Baseline: 17.3 sec Goal status: INITIAL  2.   Patient will increase FOTO score to equal to or greater than  65   to demonstrate statistically significant improvement in mobility and quality of life.  Baseline: 59 Goal status: INITIAL   3.  Patient will increase DGI Balance score by > 4 points to demonstrate decreased fall risk during functional activities. Baseline: 19 Goal status: INITIAL    4.   Patient will increase six minute walk test distance to >1300 for progression to community ambulator and improve gait ability Baseline: 1180 Goal status: INITIAL    ASSESSMENT:  CLINICAL IMPRESSION: Patient is a 66 y.o. female who was seen today for physical therapy evaluation and treatment for frequent falls and difficulty with balance.  Patient presents with mild progression from her previous functional ability when she was last seen in this clinic.  Patient is also presented with many falls in the last 6 months.  Patient demonstrates increased risk of falls as evidenced by dynamic gait index and 5 times sit to stand functional testing.  Patient also demonstrates decreased ambulatory capacity for community ambulation and exercise evidenced by 6-minute walk test results.  Patient will benefit from skilled physical therapy to improve her balance, improve her lower extremity strength, decrease her risk of falls, and improve her quality of life.  OBJECTIVE IMPAIRMENTS: Abnormal gait, decreased activity tolerance, decreased balance, decreased endurance, decreased mobility, and difficulty walking.   ACTIVITY LIMITATIONS: standing, squatting, stairs, and locomotion level  PARTICIPATION LIMITATIONS: community activity and yard work  PERSONAL FACTORS: Age and Past/current experiences are also affecting patient's functional outcome.   REHAB POTENTIAL: Good  CLINICAL DECISION MAKING: Stable/uncomplicated  EVALUATION COMPLEXITY: Low  PLAN:  PT FREQUENCY: 1-2x/week  PT DURATION: 8 weeks  PLANNED INTERVENTIONS: Therapeutic exercises,  Therapeutic activity, Neuromuscular re-education, Balance training, Gait training, Patient/Family education, Self Care, Joint mobilization, Stair training, Vestibular training, Dry Needling, Moist heat, and Manual therapy  PLAN FOR NEXT SESSION: Balance HEP, endurance as indicated, high level dynamic balance interventions    Particia Lather, PT 09/15/2022, 2:11 PM

## 2022-09-19 ENCOUNTER — Ambulatory Visit: Payer: 59

## 2022-09-19 DIAGNOSIS — R2681 Unsteadiness on feet: Secondary | ICD-10-CM | POA: Diagnosis not present

## 2022-09-19 DIAGNOSIS — M6281 Muscle weakness (generalized): Secondary | ICD-10-CM

## 2022-09-19 DIAGNOSIS — R2689 Other abnormalities of gait and mobility: Secondary | ICD-10-CM

## 2022-09-19 DIAGNOSIS — R278 Other lack of coordination: Secondary | ICD-10-CM

## 2022-09-19 NOTE — Therapy (Signed)
OUTPATIENT PHYSICAL THERAPY TREATMENT   Patient Name: [name] [name] MRN: [id number] DOB:Jan 09, 1956, 67 y.o., female Today's Date: 09/19/2022   PCP: Waylan Rocher, MD  REFERRING PROVIDER: Vladimir Crofts, MD   END OF SESSION:  PT End of Session - 09/19/22 1529     Visit Number 2    Number of Visits 24    Date for PT Re-Evaluation 11/10/22    Authorization Type [employer]HC; Medicaid of [place]    Progress Note Due on Visit 10    PT Start Time 1515    PT Stop Time 1555    PT Time Calculation (min) 40 min    Activity Tolerance Patient tolerated treatment well;No increased pain    Behavior During Therapy WFL for tasks assessed/performed             Past Medical History:  Diagnosis Date   Actinic keratosis    Acute hepatitis C without mention of hepatic coma(070.51)    Arthritis    fingers   Carpal tunnel syndrome of left wrist    Depression    GERD (gastroesophageal reflux disease)    Hypothyroidism    Insomnia    Insomnia    Memory loss    Osteoporosis    Squamous cell carcinoma of skin 12/27/2021   Left pretibial - [facility]   Tremor    Wears dentures    full upper, partial lower   Past Surgical History:  Procedure Laterality Date   ANKLE SURGERY Right    BLADDER TACT     CATARACT EXTRACTION W/PHACO Left 10/13/2021   Procedure: CATARACT EXTRACTION PHACO AND INTRAOCULAR LENS PLACEMENT ([place]) LEFT;  Surgeon: Leandrew Koyanagi, MD;  Location: [facility];  Service: Ophthalmology;  Laterality: Left;  3.97 00:49.0   CATARACT EXTRACTION W/PHACO Right 10/27/2021   Procedure: CATARACT EXTRACTION PHACO AND INTRAOCULAR LENS PLACEMENT ([place]) RIGHT 4.50 00:58.9;  Surgeon: Leandrew Koyanagi, MD;  Location: [place];  Service: Ophthalmology;  Laterality: Right;   HAND SURGERY Right    TONSILLECTOMY     Patient Active Problem List   Diagnosis Date Noted   Hepatitis C 08/19/2016   Depression 08/19/2016   Insomnia 08/19/2016   Hypothyroidism 08/19/2016     ONSET DATE: 04/14/22  REFERRING DIAG: R26.9 (ICD-10-CM) - Abnormality of gait and mobility   THERAPY DIAG:  Unsteadiness on feet  Other abnormalities of gait and mobility  Muscle weakness (generalized)  Other lack of coordination  Rationale for Evaluation and Treatment: Rehabilitation  SUBJECTIVE:  SUBJECTIVE STATEMENT: Knees have been sore. She still feels unsteady at home, but not able to use RW in house due to space constraints.   Pt accompanied by: self  PERTINENT HISTORY: Changes since last time: Pt reports since last time she was seen here for therapy she has lost her balance quite a few times. Pt reports she loses her balance about once week. 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 Pt reports her last fall pt was at home and lost her balance and fell backwards. Prior to that she fell outside when there was soft and uneven ground she was not able to accommodate for. Pt reports she lost the exercises she had been discharged with from therapy. In addition pt is having new onset of right knee pain and feels she may need to get it replaced as she got other knee replaced in the past. Pt is familiar to this clinic and has been treated here for similar problems in the past.  PAIN:  Are you having pain? No pain, just aches in legs/knees   PRECAUTIONS: Fall  WEIGHT BEARING RESTRICTIONS: No  FALLS: Has patient fallen in last 6 months? Yes. Number of falls 10-12 (per patient)     PATIENT GOALS: Get up from floor better, get up from chairs better, improve balance, decrease falls.   OBJECTIVE:    TODAY'S TREATMENT:                                                                                                                               DATE: 09/19/22   -AA/ROM warmup on recumbent elliptical x4 minutes (pt arrives with c/o bila tOA knee pain)  -hooklying knee traction fulcrum mobilization c movement 2x30s Grade III bilat -hooklying SAQ 2x10 bilat c 3lb AW -education intervention on homework to consider safer activity modification in home to address high risk activities and to reduce falls.  -supine ankle DF 1x15 bilat c 3lb AW bilat  -foam surface eyes open x30sec, eyes closed x30sec (minimal sway)  -marching in place x20, hands free, bar in place as needed for LOB (1 large LOB) while in Rt stance *recovery break  -slight incline, eyes open x15, eyes closed x30sec -airex stance c alternating head rotation x30 (inconsistent eyes open, unclear why)    PATIENT EDUCATION: Education details: POC, test results  Person educated: Patient Education method: Explanation Education comprehension: verbalized understanding  HOME EXERCISE PROGRAM: *asked patient to pick top 3 high risk balance activities in ADL performance at home and come up with simple modifications to improve safety.   GOALS: Goals reviewed with patient? Yes  SHORT TERM GOALS: Target date: 10/10/2022   Patient will be independent in home exercise program to improve strength/mobility for better functional independence with ADLs. Baseline: No HEP currently, lost HEP from last therapy  Goal status: INITIAL   LONG TERM GOALS: Target date: 11/10/2022  1.  Patient (> 38 years old) will complete five times sit to stand test in < 15 seconds indicating an increased LE strength and improved balance. Baseline: 17.3 sec Goal status: INITIAL  2.  Patient will increase FOTO score to equal to or greater than  65   to demonstrate statistically significant improvement in mobility and quality of life.  Baseline: 59 Goal status: INITIAL   3.  Patient will increase DGI Balance score by > 4 points to demonstrate decreased fall risk during functional  activities. Baseline: 19 Goal status: INITIAL   4.  Patient will increase six minute walk test distance to >1300 for progression to community ambulator and improve gait ability Baseline: 1180 Goal status: INITIAL    ASSESSMENT:  CLINICAL IMPRESSION: Initiated interventions this date based on findings from evaluation. Suspect impulsivity and dual tasking risks to be more involved in recurrent falls than singular levels of impairment. All interventions tolerated as planned. Interventions aimed at targeting static and dynamic postural control, anticipatory postural control, multilevel righting strategies, single and multiplanar movements, initiation/cessation of mobility, and full body proprioception. Also incorporated components for maximizing pt's vestibular and visual systems for full functional reintegration. Recovery intervals given as needed based on signs of exertion and/or pt request. Pt remains motivated to advance progress toward goals in order to maximize independence and safety at home. Pt requires high level assistance and cuing for completion of exercises in order to provide adequate level of stimulation and perturbation. Author allows pt as much opportunity as possible to perform independent righting strategies, only providing physical assist when pt is unable correct LOB independently. The patient's therapy prognosis indicates continued potential for improvement, anticipate that future progress is attainable in a reasonable/predictable timeframe. Maximum improvement is within reach. Pt will continue to benefit from skilled PT services to address deficits and impairment identified in evaluation in order to maximize independence and safety in basic mobility required for performance of ADL, IADL, and leisure.   OBJECTIVE IMPAIRMENTS: Abnormal gait, decreased activity tolerance, decreased balance, decreased endurance, decreased mobility, and difficulty walking.   ACTIVITY LIMITATIONS:  standing, squatting, stairs, and locomotion level  PARTICIPATION LIMITATIONS: community activity and yard work  PERSONAL FACTORS: Age and Past/current experiences are also affecting patient's functional outcome.   REHAB POTENTIAL: Good  CLINICAL DECISION MAKING: Stable/uncomplicated  EVALUATION COMPLEXITY: Low  PLAN:  PT FREQUENCY: 1-2x/week  PT DURATION: 8 weeks  PLANNED INTERVENTIONS: Therapeutic exercises, Therapeutic activity, Neuromuscular re-education, Balance training, Gait training, Patient/Family education, Self Care, Joint mobilization, Stair training, Vestibular training, Dry Needling, Moist heat, and Manual therapy  PLAN FOR NEXT SESSION: Balance HEP, endurance as indicated, high level dynamic balance interventions     Louden Houseworth C, PT 09/19/2022, 3:30 PM  3:30 PM, 09/19/22 Etta Grandchild, PT, DPT Physical Therapist - [place] [phone]

## 2022-09-21 ENCOUNTER — Ambulatory Visit: Payer: 59 | Admitting: Physical Therapy

## 2022-09-21 NOTE — Therapy (Deleted)
OUTPATIENT PHYSICAL THERAPY TREATMENT   Patient Name: [name] [name] MRN: [id number] DOB:08/04/1956, 67 y.o., female Today's Date: 09/21/2022   PCP: Waylan Rocher, MD  REFERRING PROVIDER: Vladimir Crofts, MD   END OF SESSION:    Past Medical History:  Diagnosis Date   Actinic keratosis    Acute hepatitis C without mention of hepatic coma(070.51)    Arthritis    fingers   Carpal tunnel syndrome of left wrist    Depression    GERD (gastroesophageal reflux disease)    Hypothyroidism    Insomnia    Insomnia    Memory loss    Osteoporosis    Squamous cell carcinoma of skin 12/27/2021   Left pretibial - [facility]   Tremor    Wears dentures    full upper, partial lower   Past Surgical History:  Procedure Laterality Date   ANKLE SURGERY Right    BLADDER TACT     CATARACT EXTRACTION W/PHACO Left 10/13/2021   Procedure: CATARACT EXTRACTION PHACO AND INTRAOCULAR LENS PLACEMENT ([place]) LEFT;  Surgeon: Leandrew Koyanagi, MD;  Location: [place];  Service: Ophthalmology;  Laterality: Left;  3.97 00:49.0   CATARACT EXTRACTION W/PHACO Right 10/27/2021   Procedure: CATARACT EXTRACTION PHACO AND INTRAOCULAR LENS PLACEMENT ([place]) RIGHT 4.50 00:58.9;  Surgeon: Leandrew Koyanagi, MD;  Location: [place];  Service: Ophthalmology;  Laterality: Right;   HAND SURGERY Right    TONSILLECTOMY     Patient Active Problem List   Diagnosis Date Noted   Hepatitis C 08/19/2016   Depression 08/19/2016   Insomnia 08/19/2016   Hypothyroidism 08/19/2016    ONSET DATE: 04/14/22  REFERRING DIAG: R26.9 (ICD-10-CM) - Abnormality of gait and mobility   THERAPY DIAG:  Unsteadiness on feet  Other abnormalities of gait and mobility  Muscle weakness (generalized)  Other lack of coordination  Rationale for Evaluation and Treatment: Rehabilitation  SUBJECTIVE:                                                                                                                                                                                              SUBJECTIVE STATEMENT: Knees have been sore. She still feels unsteady at home, but not able to use RW in house due to space constraints.   Pt accompanied by: self  PERTINENT HISTORY: Changes since last time: Pt reports since last time she was seen here for therapy she has lost her balance quite a few times. Pt reports she loses her balance about once week. 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 Pt reports her last fall pt was at home and lost her balance and fell backwards. Prior to that she fell outside when there was soft and uneven ground she was not able to accommodate for. Pt reports she lost the exercises she had been discharged with from therapy. In addition pt is having new onset of right knee pain and feels she may need to get it replaced as she got other knee replaced in the past. Pt is familiar to this clinic and has been treated here for similar problems in the past.  PAIN:  Are you having pain? No pain, just aches in legs/knees   PRECAUTIONS: Fall  WEIGHT BEARING RESTRICTIONS: No  FALLS: Has patient fallen in last 6 months? Yes. Number of falls 10-12 (per patient)     PATIENT GOALS: Get up from floor better, get up from chairs better, improve balance, decrease falls.   OBJECTIVE:    TODAY'S TREATMENT:                                                                                                                              DATE: 09/21/22   -AA/ROM warmup on recumbent elliptical x4 minutes (pt arrives with c/o bila tOA knee pain)  -hooklying knee traction fulcrum mobilization c movement 2x30s Grade III bilat -hooklying SAQ 2x10 bilat c 3lb AW -education intervention on homework to consider safer activity modification in home to address high risk activities and to reduce falls.  -supine ankle DF 1x15 bilat c 3lb AW bilat  -foam  surface eyes open x30sec, eyes closed x30sec (minimal sway)  -marching in place x20, hands free, bar in place as needed for LOB (1 large LOB) while in Rt stance *recovery break  -slight incline, eyes open x15, eyes closed x30sec -airex stance c alternating head rotation x30 (inconsistent eyes open, unclear why)    PATIENT EDUCATION: Education details: POC, test results  Person educated: Patient Education method: Explanation Education comprehension: verbalized understanding  HOME EXERCISE PROGRAM: *asked patient to pick top 3 high risk balance activities in ADL performance at home and come up with simple modifications to improve safety.   GOALS: Goals reviewed with patient? Yes  SHORT TERM GOALS: Target date: 10/10/2022   Patient will be independent in home exercise program to improve strength/mobility for better functional independence with ADLs. Baseline: No HEP currently, lost HEP from last therapy  Goal status: INITIAL   LONG TERM GOALS: Target date: 11/10/2022   1.  Patient (> 57 years old) will complete five times sit to stand test in < 15 seconds indicating an increased LE strength and improved balance. Baseline: 17.3 sec Goal status: INITIAL  2.  Patient will increase FOTO score to equal to or greater than  65   to demonstrate statistically significant improvement in mobility and quality of life.  Baseline: 59 Goal status: INITIAL   3.  Patient will increase DGI Balance score by > 4 points to  demonstrate decreased fall risk during functional activities. Baseline: 19 Goal status: INITIAL   4.  Patient will increase six minute walk test distance to >1300 for progression to community ambulator and improve gait ability Baseline: 1180 Goal status: INITIAL    ASSESSMENT:  CLINICAL IMPRESSION: Initiated interventions this date based on findings from evaluation. Suspect impulsivity and dual tasking risks to be more involved in recurrent falls than singular levels of  impairment. All interventions tolerated as planned. Interventions aimed at targeting static and dynamic postural control, anticipatory postural control, multilevel righting strategies, single and multiplanar movements, initiation/cessation of mobility, and full body proprioception. Also incorporated components for maximizing pt's vestibular and visual systems for full functional reintegration. Recovery intervals given as needed based on signs of exertion and/or pt request. Pt remains motivated to advance progress toward goals in order to maximize independence and safety at home. Pt requires high level assistance and cuing for completion of exercises in order to provide adequate level of stimulation and perturbation. Author allows pt as much opportunity as possible to perform independent righting strategies, only providing physical assist when pt is unable correct LOB independently. The patient's therapy prognosis indicates continued potential for improvement, anticipate that future progress is attainable in a reasonable/predictable timeframe. Maximum improvement is within reach. Pt will continue to benefit from skilled PT services to address deficits and impairment identified in evaluation in order to maximize independence and safety in basic mobility required for performance of ADL, IADL, and leisure.   OBJECTIVE IMPAIRMENTS: Abnormal gait, decreased activity tolerance, decreased balance, decreased endurance, decreased mobility, and difficulty walking.   ACTIVITY LIMITATIONS: standing, squatting, stairs, and locomotion level  PARTICIPATION LIMITATIONS: community activity and yard work  PERSONAL FACTORS: Age and Past/current experiences are also affecting patient's functional outcome.   REHAB POTENTIAL: Good  CLINICAL DECISION MAKING: Stable/uncomplicated  EVALUATION COMPLEXITY: Low  PLAN:  PT FREQUENCY: 1-2x/week  PT DURATION: 8 weeks  PLANNED INTERVENTIONS: Therapeutic exercises, Therapeutic  activity, Neuromuscular re-education, Balance training, Gait training, Patient/Family education, Self Care, Joint mobilization, Stair training, Vestibular training, Dry Needling, Moist heat, and Manual therapy  PLAN FOR NEXT SESSION: Balance HEP, endurance as indicated, high level dynamic balance interventions     Particia Lather, PT 09/21/2022, 8:41 AM  8:41 AM, 09/21/22 Particia Lather PT ,DPT Physical Therapist- [facility]

## 2022-09-27 ENCOUNTER — Ambulatory Visit: Payer: 59 | Admitting: Physical Therapy

## 2022-09-27 ENCOUNTER — Telehealth: Payer: Self-pay | Admitting: Physical Therapy

## 2022-09-27 NOTE — Telephone Encounter (Signed)
Pt contacted via telephone and author left voice mail informing of missed appointment and informed pt of future PT appointment date and time.   Rivka Barbara PT, DPT

## 2022-09-27 NOTE — Therapy (Incomplete)
OUTPATIENT PHYSICAL THERAPY TREATMENT   Patient Name: [name] [name] MRN: [id number] DOB:11/23/1955, 67 y.o., female Today's Date: 09/27/2022   PCP: Waylan Rocher, MD  REFERRING PROVIDER: Vladimir Crofts, MD   END OF SESSION:    Past Medical History:  Diagnosis Date   Actinic keratosis    Acute hepatitis C without mention of hepatic coma(070.51)    Arthritis    fingers   Carpal tunnel syndrome of left wrist    Depression    GERD (gastroesophageal reflux disease)    Hypothyroidism    Insomnia    Insomnia    Memory loss    Osteoporosis    Squamous cell carcinoma of skin 12/27/2021   Left pretibial - [facility]   Tremor    Wears dentures    full upper, partial lower   Past Surgical History:  Procedure Laterality Date   ANKLE SURGERY Right    BLADDER TACT     CATARACT EXTRACTION W/PHACO Left 10/13/2021   Procedure: CATARACT EXTRACTION PHACO AND INTRAOCULAR LENS PLACEMENT ([place]) LEFT;  Surgeon: Leandrew Koyanagi, MD;  Location: [place];  Service: Ophthalmology;  Laterality: Left;  3.97 00:49.0   CATARACT EXTRACTION W/PHACO Right 10/27/2021   Procedure: CATARACT EXTRACTION PHACO AND INTRAOCULAR LENS PLACEMENT ([place]) RIGHT 4.50 00:58.9;  Surgeon: Leandrew Koyanagi, MD;  Location: [place];  Service: Ophthalmology;  Laterality: Right;   HAND SURGERY Right    TONSILLECTOMY     Patient Active Problem List   Diagnosis Date Noted   Hepatitis C 08/19/2016   Depression 08/19/2016   Insomnia 08/19/2016   Hypothyroidism 08/19/2016    ONSET DATE: 04/14/22  REFERRING DIAG: R26.9 (ICD-10-CM) - Abnormality of gait and mobility   THERAPY DIAG:  No diagnosis found.  Rationale for Evaluation and Treatment: Rehabilitation  SUBJECTIVE:                                                                                                                                                                                             SUBJECTIVE STATEMENT: Knees  have been sore. She still feels unsteady at home, but not able to use RW in house due to space constraints.   Pt accompanied by: self  PERTINENT HISTORY: Changes since last time: Pt reports since last time she was seen here for therapy she has lost her balance quite a few times. Pt reports she loses her balance about once week. 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 Pt reports her last fall pt was at home and lost her balance and fell  backwards. Prior to that she fell outside when there was soft and uneven ground she was not able to accommodate for. Pt reports she lost the exercises she had been discharged with from therapy. In addition pt is having new onset of right knee pain and feels she may need to get it replaced as she got other knee replaced in the past. Pt is familiar to this clinic and has been treated here for similar problems in the past.  PAIN:  Are you having pain? No pain, just aches in legs/knees   PRECAUTIONS: Fall  WEIGHT BEARING RESTRICTIONS: No  FALLS: Has patient fallen in last 6 months? Yes. Number of falls 10-12 (per patient)     PATIENT GOALS: Get up from floor better, get up from chairs better, improve balance, decrease falls.   OBJECTIVE:    TODAY'S TREATMENT:                                                                                                                              DATE: 09/27/22   -AA/ROM warmup on recumbent elliptical x4 minutes (pt arrives with c/o bila tOA knee pain)  -hooklying knee traction fulcrum mobilization c movement 2x30s Grade III bilat  -hooklying SAQ 2x10 bilat c 3lb AW -education intervention on homework to consider safer activity modification in home to address high risk activities and to reduce falls.  -supine ankle DF 1x15 bilat c 3lb AW bilat  -foam surface eyes open x30sec, eyes closed x30sec (minimal sway)  -marching in place x20, hands free, bar in  place as needed for LOB (1 large LOB) while in Rt stance *recovery break  -slight incline, eyes open x15, eyes closed x30sec -airex stance c alternating head rotation x30 (inconsistent eyes open, unclear why)    PATIENT EDUCATION: Education details: POC, test results  Person educated: Patient Education method: Explanation Education comprehension: verbalized understanding  HOME EXERCISE PROGRAM: *asked patient to pick top 3 high risk balance activities in ADL performance at home and come up with simple modifications to improve safety.   GOALS: Goals reviewed with patient? Yes  SHORT TERM GOALS: Target date: 10/10/2022   Patient will be independent in home exercise program to improve strength/mobility for better functional independence with ADLs. Baseline: No HEP currently, lost HEP from last therapy  Goal status: INITIAL   LONG TERM GOALS: Target date: 11/10/2022   1.  Patient (> 41 years old) will complete five times sit to stand test in < 15 seconds indicating an increased LE strength and improved balance. Baseline: 17.3 sec Goal status: INITIAL  2.  Patient will increase FOTO score to equal to or greater than  65   to demonstrate statistically significant improvement in mobility and quality of life.  Baseline: 59 Goal status: INITIAL   3.  Patient will increase DGI Balance score by > 4 points to demonstrate decreased fall risk during functional activities. Baseline: 19 Goal status: INITIAL   4.  Patient will increase six minute walk test distance to >1300 for progression to community ambulator and improve gait ability Baseline: 1180 Goal status: INITIAL    ASSESSMENT:  CLINICAL IMPRESSION: Initiated interventions this date based on findings from evaluation. Suspect impulsivity and dual tasking risks to be more involved in recurrent falls than singular levels of impairment. All interventions tolerated as planned. Interventions aimed at targeting static and dynamic  postural control, anticipatory postural control, multilevel righting strategies, single and multiplanar movements, initiation/cessation of mobility, and full body proprioception. Also incorporated components for maximizing pt's vestibular and visual systems for full functional reintegration. Recovery intervals given as needed based on signs of exertion and/or pt request. Pt remains motivated to advance progress toward goals in order to maximize independence and safety at home. Pt requires high level assistance and cuing for completion of exercises in order to provide adequate level of stimulation and perturbation. Author allows pt as much opportunity as possible to perform independent righting strategies, only providing physical assist when pt is unable correct LOB independently. The patient's therapy prognosis indicates continued potential for improvement, anticipate that future progress is attainable in a reasonable/predictable timeframe. Maximum improvement is within reach. Pt will continue to benefit from skilled PT services to address deficits and impairment identified in evaluation in order to maximize independence and safety in basic mobility required for performance of ADL, IADL, and leisure.   OBJECTIVE IMPAIRMENTS: Abnormal gait, decreased activity tolerance, decreased balance, decreased endurance, decreased mobility, and difficulty walking.   ACTIVITY LIMITATIONS: standing, squatting, stairs, and locomotion level  PARTICIPATION LIMITATIONS: community activity and yard work  PERSONAL FACTORS: Age and Past/current experiences are also affecting patient's functional outcome.   REHAB POTENTIAL: Good  CLINICAL DECISION MAKING: Stable/uncomplicated  EVALUATION COMPLEXITY: Low  PLAN:  PT FREQUENCY: 1-2x/week  PT DURATION: 8 weeks  PLANNED INTERVENTIONS: Therapeutic exercises, Therapeutic activity, Neuromuscular re-education, Balance training, Gait training, Patient/Family education, Self  Care, Joint mobilization, Stair training, Vestibular training, Dry Needling, Moist heat, and Manual therapy  PLAN FOR NEXT SESSION: Balance HEP, endurance as indicated, high level dynamic balance interventions     Particia Lather, PT 09/27/2022, 11:39 AM  11:39 AM, 09/27/22 Particia Lather PT ,DPT Physical Therapist- [facility]

## 2022-09-29 ENCOUNTER — Telehealth: Payer: Self-pay | Admitting: Physical Therapy

## 2022-09-29 ENCOUNTER — Ambulatory Visit: Payer: 59 | Admitting: Physical Therapy

## 2022-09-29 NOTE — Telephone Encounter (Signed)
Contacted patient rating missed appointment at 845 on February 15.  Patient reports she was about to hit here and have the time wrong but she is apologetic for this mistake.  Patient reports she will be in attendance at appointment next week on Tuesday at 845.  Particia Lather  ,DPT Physical Therapist- [facility]

## 2022-09-29 NOTE — Therapy (Deleted)
OUTPATIENT PHYSICAL THERAPY TREATMENT   Patient Name: [name] [name] MRN: [id number] DOB:Dec 16, 1955, 67 y.o., female Today's Date: 09/29/2022   PCP: Waylan Rocher, MD  REFERRING PROVIDER: Vladimir Crofts, MD   END OF SESSION:    Past Medical History:  Diagnosis Date   Actinic keratosis    Acute hepatitis C without mention of hepatic coma(070.51)    Arthritis    fingers   Carpal tunnel syndrome of left wrist    Depression    GERD (gastroesophageal reflux disease)    Hypothyroidism    Insomnia    Insomnia    Memory loss    Osteoporosis    Squamous cell carcinoma of skin 12/27/2021   Left pretibial - [facility]   Tremor    Wears dentures    full upper, partial lower   Past Surgical History:  Procedure Laterality Date   ANKLE SURGERY Right    BLADDER TACT     CATARACT EXTRACTION W/PHACO Left 10/13/2021   Procedure: CATARACT EXTRACTION PHACO AND INTRAOCULAR LENS PLACEMENT ([place]) LEFT;  Surgeon: Leandrew Koyanagi, MD;  Location: [place];  Service: Ophthalmology;  Laterality: Left;  3.97 00:49.0   CATARACT EXTRACTION W/PHACO Right 10/27/2021   Procedure: CATARACT EXTRACTION PHACO AND INTRAOCULAR LENS PLACEMENT ([place]) RIGHT 4.50 00:58.9;  Surgeon: Leandrew Koyanagi, MD;  Location: [place];  Service: Ophthalmology;  Laterality: Right;   HAND SURGERY Right    TONSILLECTOMY     Patient Active Problem List   Diagnosis Date Noted   Hepatitis C 08/19/2016   Depression 08/19/2016   Insomnia 08/19/2016   Hypothyroidism 08/19/2016    ONSET DATE: 04/14/22  REFERRING DIAG: R26.9 (ICD-10-CM) - Abnormality of gait and mobility   THERAPY DIAG:  No diagnosis found.  Rationale for Evaluation and Treatment: Rehabilitation  SUBJECTIVE:                                                                                                                                                                                             SUBJECTIVE STATEMENT: Knees  have been sore. She still feels unsteady at home, but not able to use RW in house due to space constraints.   Pt accompanied by: self  PERTINENT HISTORY: Changes since last time: Pt reports since last time she was seen here for therapy she has lost her balance quite a few times. Pt reports she loses her balance about once week. 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 Pt reports her last fall pt was at home and lost her balance and fell  backwards. Prior to that she fell outside when there was soft and uneven ground she was not able to accommodate for. Pt reports she lost the exercises she had been discharged with from therapy. In addition pt is having new onset of right knee pain and feels she may need to get it replaced as she got other knee replaced in the past. Pt is familiar to this clinic and has been treated here for similar problems in the past.  PAIN:  Are you having pain? No pain, just aches in legs/knees   PRECAUTIONS: Fall  WEIGHT BEARING RESTRICTIONS: No  FALLS: Has patient fallen in last 6 months? Yes. Number of falls 10-12 (per patient)     PATIENT GOALS: Get up from floor better, get up from chairs better, improve balance, decrease falls.   OBJECTIVE:    TODAY'S TREATMENT:                                                                                                                              DATE: 09/29/22   -AA/ROM warmup on recumbent elliptical x4 minutes (pt arrives with c/o bila tOA knee pain)  -hooklying knee traction fulcrum mobilization c movement 2x30s Grade III bilat  -hooklying SAQ 2x10 bilat c 3lb AW -education intervention on homework to consider safer activity modification in home to address high risk activities and to reduce falls.  -supine ankle DF 1x15 bilat c 3lb AW bilat  -foam surface eyes open x30sec, eyes closed x30sec (minimal sway)  -marching in place x20, hands free, bar in  place as needed for LOB (1 large LOB) while in Rt stance *recovery break  -slight incline, eyes open x15, eyes closed x30sec -airex stance c alternating head rotation x30 (inconsistent eyes open, unclear why)    PATIENT EDUCATION: Education details: POC, test results  Person educated: Patient Education method: Explanation Education comprehension: verbalized understanding  HOME EXERCISE PROGRAM: *asked patient to pick top 3 high risk balance activities in ADL performance at home and come up with simple modifications to improve safety.   GOALS: Goals reviewed with patient? Yes  SHORT TERM GOALS: Target date: 10/10/2022   Patient will be independent in home exercise program to improve strength/mobility for better functional independence with ADLs. Baseline: No HEP currently, lost HEP from last therapy  Goal status: INITIAL   LONG TERM GOALS: Target date: 11/10/2022   1.  Patient (> 28 years old) will complete five times sit to stand test in < 15 seconds indicating an increased LE strength and improved balance. Baseline: 17.3 sec Goal status: INITIAL  2.  Patient will increase FOTO score to equal to or greater than  65   to demonstrate statistically significant improvement in mobility and quality of life.  Baseline: 59 Goal status: INITIAL   3.  Patient will increase DGI Balance score by > 4 points to demonstrate decreased fall risk during functional activities. Baseline: 19 Goal status: INITIAL   4.  Patient will increase six minute walk test distance to >1300 for progression to community ambulator and improve gait ability Baseline: 1180 Goal status: INITIAL    ASSESSMENT:  CLINICAL IMPRESSION: Initiated interventions this date based on findings from evaluation. Suspect impulsivity and dual tasking risks to be more involved in recurrent falls than singular levels of impairment. All interventions tolerated as planned. Interventions aimed at targeting static and dynamic  postural control, anticipatory postural control, multilevel righting strategies, single and multiplanar movements, initiation/cessation of mobility, and full body proprioception. Also incorporated components for maximizing pt's vestibular and visual systems for full functional reintegration. Recovery intervals given as needed based on signs of exertion and/or pt request. Pt remains motivated to advance progress toward goals in order to maximize independence and safety at home. Pt requires high level assistance and cuing for completion of exercises in order to provide adequate level of stimulation and perturbation. Author allows pt as much opportunity as possible to perform independent righting strategies, only providing physical assist when pt is unable correct LOB independently. The patient's therapy prognosis indicates continued potential for improvement, anticipate that future progress is attainable in a reasonable/predictable timeframe. Maximum improvement is within reach. Pt will continue to benefit from skilled PT services to address deficits and impairment identified in evaluation in order to maximize independence and safety in basic mobility required for performance of ADL, IADL, and leisure.   OBJECTIVE IMPAIRMENTS: Abnormal gait, decreased activity tolerance, decreased balance, decreased endurance, decreased mobility, and difficulty walking.   ACTIVITY LIMITATIONS: standing, squatting, stairs, and locomotion level  PARTICIPATION LIMITATIONS: community activity and yard work  PERSONAL FACTORS: Age and Past/current experiences are also affecting patient's functional outcome.   REHAB POTENTIAL: Good  CLINICAL DECISION MAKING: Stable/uncomplicated  EVALUATION COMPLEXITY: Low  PLAN:  PT FREQUENCY: 1-2x/week  PT DURATION: 8 weeks  PLANNED INTERVENTIONS: Therapeutic exercises, Therapeutic activity, Neuromuscular re-education, Balance training, Gait training, Patient/Family education, Self  Care, Joint mobilization, Stair training, Vestibular training, Dry Needling, Moist heat, and Manual therapy  PLAN FOR NEXT SESSION: Balance HEP, endurance as indicated, high level dynamic balance interventions     Particia Lather, PT 09/29/2022, 8:27 AM  8:27 AM, 09/29/22 Particia Lather PT ,DPT Physical Therapist- [facility]

## 2022-10-04 ENCOUNTER — Encounter: Payer: Self-pay | Admitting: Physical Therapy

## 2022-10-04 ENCOUNTER — Ambulatory Visit: Payer: 59 | Admitting: Physical Therapy

## 2022-10-04 DIAGNOSIS — R2681 Unsteadiness on feet: Secondary | ICD-10-CM | POA: Diagnosis not present

## 2022-10-04 DIAGNOSIS — M6281 Muscle weakness (generalized): Secondary | ICD-10-CM

## 2022-10-04 DIAGNOSIS — R278 Other lack of coordination: Secondary | ICD-10-CM

## 2022-10-04 DIAGNOSIS — R2689 Other abnormalities of gait and mobility: Secondary | ICD-10-CM

## 2022-10-04 NOTE — Therapy (Signed)
OUTPATIENT PHYSICAL THERAPY TREATMENT   Patient Name: [name] [name] MRN: [id number] DOB:1956/05/14, 67 y.o., female Today's Date: 10/04/2022   PCP: Waylan Rocher, MD  REFERRING PROVIDER: Vladimir Crofts, MD   END OF SESSION:  PT End of Session - 10/04/22 1033     Visit Number 3    Number of Visits 24    Date for PT Re-Evaluation 11/10/22    Authorization Type UHC; Medicaid of [place]    Progress Note Due on Visit 10    PT Start Time [phone]    PT Stop Time 0929    PT Time Calculation (min) 31 min    Activity Tolerance Patient tolerated treatment well;No increased pain    Behavior During Therapy WFL for tasks assessed/performed              Past Medical History:  Diagnosis Date   Actinic keratosis    Acute hepatitis C without mention of hepatic coma(070.51)    Arthritis    fingers   Carpal tunnel syndrome of left wrist    Depression    GERD (gastroesophageal reflux disease)    Hypothyroidism    Insomnia    Insomnia    Memory loss    Osteoporosis    Squamous cell carcinoma of skin 12/27/2021   Left pretibial - [facility]   Tremor    Wears dentures    full upper, partial lower   Past Surgical History:  Procedure Laterality Date   ANKLE SURGERY Right    BLADDER TACT     CATARACT EXTRACTION W/PHACO Left 10/13/2021   Procedure: CATARACT EXTRACTION PHACO AND INTRAOCULAR LENS PLACEMENT ([place]) LEFT;  Surgeon: Leandrew Koyanagi, MD;  Location: [place];  Service: Ophthalmology;  Laterality: Left;  3.97 00:49.0   CATARACT EXTRACTION W/PHACO Right 10/27/2021   Procedure: CATARACT EXTRACTION PHACO AND INTRAOCULAR LENS PLACEMENT ([place]) RIGHT 4.50 00:58.9;  Surgeon: Leandrew Koyanagi, MD;  Location: [place];  Service: Ophthalmology;  Laterality: Right;   HAND SURGERY Right    TONSILLECTOMY     Patient Active Problem List   Diagnosis Date Noted   Hepatitis C 08/19/2016   Depression 08/19/2016   Insomnia 08/19/2016   Hypothyroidism 08/19/2016     ONSET DATE: 04/14/22  REFERRING DIAG: R26.9 (ICD-10-CM) - Abnormality of gait and mobility   THERAPY DIAG:  Unsteadiness on feet  Other abnormalities of gait and mobility  Muscle weakness (generalized)  Other lack of coordination  Rationale for Evaluation and Treatment: Rehabilitation  SUBJECTIVE:  SUBJECTIVE STATEMENT: Pt reports she is late due to her having trouble with finding her keys and finding parking. No falls since last visit, knee is not hurting at the moment.   Pt accompanied by: self  PERTINENT HISTORY: Changes since last time: Pt reports since last time she was seen here for therapy she has lost her balance quite a few times. Pt reports she loses her balance about once week. 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 Pt reports her last fall pt was at home and lost her balance and fell backwards. Prior to that she fell outside when there was soft and uneven ground she was not able to accommodate for. Pt reports she lost the exercises she had been discharged with from therapy. In addition pt is having new onset of right knee pain and feels she may need to get it replaced as she got other knee replaced in the past. Pt is familiar to this clinic and has been treated here for similar problems in the past.  PAIN:  Are you having pain? No pain, just aches in legs/knees   PRECAUTIONS: Fall  WEIGHT BEARING RESTRICTIONS: No  FALLS: Has patient fallen in last 6 months? Yes. Number of falls 10-12 (per patient)     PATIENT GOALS: Get up from floor better, get up from chairs better, improve balance, decrease falls.   OBJECTIVE:    TODAY'S TREATMENT:                                                                                                                               DATE: 10/04/22   - LAQ 2 x 10 with 5# AW  - hip ABD standing with 5# AW 2 x 10  Hip march with 5# AW 2 x 10   Kore balance machine to work on dual task and sensorimotor integration  5 rounds of Pop dot with goal of dynamic weight shifting to various limits of stability with intermittent upper extremity assist 3 rounds of core balance maze 2 rounds and circular maze increase improving x 150 seconds to 75 seconds and then 1 round on square maze again working on dynamic weight shifting and neuromuscular reeducation and control   airex stance c alternating head rotation x30    Note: Portions of this document were prepared using Systems analyst and although reviewed may contain unintentional dictation errors in syntax, grammar, or spelling.   PATIENT EDUCATION: Education details: POC, test results  Person educated: Patient Education method: Explanation Education comprehension: verbalized understanding  HOME EXERCISE PROGRAM: *asked patient to pick top 3 high risk balance activities in ADL performance at home and come up with simple modifications to improve safety.   GOALS: Goals reviewed with patient? Yes  SHORT TERM GOALS: Target date: 10/10/2022   Patient will be independent in home exercise program to improve strength/mobility for better functional  independence with ADLs. Baseline: No HEP currently, lost HEP from last therapy  Goal status: INITIAL   LONG TERM GOALS: Target date: 11/10/2022   1.  Patient (> 69 years old) will complete five times sit to stand test in < 15 seconds indicating an increased LE strength and improved balance. Baseline: 17.3 sec Goal status: INITIAL  2.  Patient will increase FOTO score to equal to or greater than  65   to demonstrate statistically significant improvement in mobility and quality of life.  Baseline: 59 Goal status: INITIAL   3.  Patient will increase DGI Balance  score by > 4 points to demonstrate decreased fall risk during functional activities. Baseline: 19 Goal status: INITIAL   4.  Patient will increase six minute walk test distance to >1300 for progression to community ambulator and improve gait ability Baseline: 1180 Goal status: INITIAL    ASSESSMENT:  CLINICAL IMPRESSION: Patient continues with general lower extremity strengthening for improvement and muscular power and strength.  Patient does present to therapy several minutes late which did limited treatment time in interventions this date.  Patient introduced to core balance activity to work on Event organiser and sensory integration.Pt will continue to benefit from skilled physical therapy intervention to address impairments, improve QOL, and attain therapy goals.     OBJECTIVE IMPAIRMENTS: Abnormal gait, decreased activity tolerance, decreased balance, decreased endurance, decreased mobility, and difficulty walking.   ACTIVITY LIMITATIONS: standing, squatting, stairs, and locomotion level  PARTICIPATION LIMITATIONS: community activity and yard work  PERSONAL FACTORS: Age and Past/current experiences are also affecting patient's functional outcome.   REHAB POTENTIAL: Good  CLINICAL DECISION MAKING: Stable/uncomplicated  EVALUATION COMPLEXITY: Low  PLAN:  PT FREQUENCY: 1-2x/week  PT DURATION: 8 weeks  PLANNED INTERVENTIONS: Therapeutic exercises, Therapeutic activity, Neuromuscular re-education, Balance training, Gait training, Patient/Family education, Self Care, Joint mobilization, Stair training, Vestibular training, Dry Needling, Moist heat, and Manual therapy  PLAN FOR NEXT SESSION: Balance HEP, endurance as indicated, high level dynamic balance interventions     Particia Lather, PT 10/04/2022, 10:34 AM  10:34 AM, 10/04/22 Particia Lather PT ,DPT Physical Therapist- [facility]

## 2022-10-06 ENCOUNTER — Encounter: Payer: Self-pay | Admitting: Physical Therapy

## 2022-10-06 ENCOUNTER — Ambulatory Visit: Payer: 59 | Admitting: Physical Therapy

## 2022-10-06 DIAGNOSIS — R2681 Unsteadiness on feet: Secondary | ICD-10-CM

## 2022-10-06 DIAGNOSIS — M6281 Muscle weakness (generalized): Secondary | ICD-10-CM

## 2022-10-06 DIAGNOSIS — R2689 Other abnormalities of gait and mobility: Secondary | ICD-10-CM

## 2022-10-06 NOTE — Therapy (Signed)
OUTPATIENT PHYSICAL THERAPY TREATMENT   Patient Name: [name] [name] MRN: [id number] DOB:1956/02/06, 67 y.o., female Today's Date: 10/06/2022   PCP: Waylan Rocher, MD  REFERRING PROVIDER: Vladimir Crofts, MD   END OF SESSION:  PT End of Session - 10/06/22 1354     Visit Number 4    Number of Visits 24    Date for PT Re-Evaluation 11/10/22    Authorization Type UHC; Medicaid of     Progress Note Due on Visit 10    PT Start Time 0845    PT Stop Time 0928    PT Time Calculation (min) 43 min    Activity Tolerance Patient tolerated treatment well;No increased pain    Behavior During Therapy WFL for tasks assessed/performed               Past Medical History:  Diagnosis Date   Actinic keratosis    Acute hepatitis C without mention of hepatic coma(070.51)    Arthritis    fingers   Carpal tunnel syndrome of left wrist    Depression    GERD (gastroesophageal reflux disease)    Hypothyroidism    Insomnia    Insomnia    Memory loss    Osteoporosis    Squamous cell carcinoma of skin 12/27/2021   Left pretibial - EDC   Tremor    Wears dentures    full upper, partial lower   Past Surgical History:  Procedure Laterality Date   ANKLE SURGERY Right    BLADDER TACT     CATARACT EXTRACTION W/PHACO Left 10/13/2021   Procedure: CATARACT EXTRACTION PHACO AND INTRAOCULAR LENS PLACEMENT ([place]) LEFT;  Surgeon: Leandrew Koyanagi, MD;  Location: [place];  Service: Ophthalmology;  Laterality: Left;  3.97 00:49.0   CATARACT EXTRACTION W/PHACO Right 10/27/2021   Procedure: CATARACT EXTRACTION PHACO AND INTRAOCULAR LENS PLACEMENT ([place]) RIGHT 4.50 00:58.9;  Surgeon: Leandrew Koyanagi, MD;  Location: [place];  Service: Ophthalmology;  Laterality: Right;   HAND SURGERY Right    TONSILLECTOMY     Patient Active Problem List   Diagnosis Date Noted   Hepatitis C 08/19/2016   Depression 08/19/2016   Insomnia 08/19/2016   Hypothyroidism 08/19/2016     ONSET DATE: 04/14/22  REFERRING DIAG: R26.9 (ICD-10-CM) - Abnormality of gait and mobility   THERAPY DIAG:  Unsteadiness on feet  Other abnormalities of gait and mobility  Muscle weakness (generalized)  Rationale for Evaluation and Treatment: Rehabilitation  SUBJECTIVE:  SUBJECTIVE STATEMENT: Pt reports she needs to have knee pain when transitional movements but no knee pain once in standing and ambulating.  Pt accompanied by: self  PERTINENT HISTORY: Changes since last time: Pt reports since last time she was seen here for therapy she has lost her balance quite a few times. Pt reports she loses her balance about once week. 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 Pt reports her last fall pt was at home and lost her balance and fell backwards. Prior to that she fell outside when there was soft and uneven ground she was not able to accommodate for. Pt reports she lost the exercises she had been discharged with from therapy. In addition pt is having new onset of right knee pain and feels she may need to get it replaced as she got other knee replaced in the past. Pt is familiar to this clinic and has been treated here for similar problems in the past.  PAIN:  Are you having pain? No pain, just aches in legs/knees   PRECAUTIONS: Fall  WEIGHT BEARING RESTRICTIONS: No  FALLS: Has patient fallen in last 6 months? Yes. Number of falls 10-12 (per patient)     PATIENT GOALS: Get up from floor better, get up from chairs better, improve balance, decrease falls.   OBJECTIVE:    TODAY'S TREATMENT:                                                                                                                              DATE:  10/06/22 TE  Access Code: [email or web address] URL: [email or web address] Date: 10/06/2022 Prepared by: Rivka Barbara Initiated and instructed in the following HEP   Exercises - Standing March with Counter Support  - 1 x daily - 7 x weekly - 2 sets - 10 reps - 2 sec  hold - standing in split stance with vertical head nods   - 1 x daily - 7 x weekly - 2 sets - 15 reps - standing in split stance with vertical head nods   - 1 x daily - 7 x weekly - 2 sets - 15 reps  - LAQ 2 x 10 with 5# AW   - Hip ABD standing with 5# AW 2 x 10   -Step up to 6 in block x 10 ea LE no UE support   NMR  -Side step on / off airex pad x 10 ea direction working on transitions to unstable surfaces   Kore balance machine to work on dual task and sensorimotor integration  1 rounds of core balance maze with triangle based maze working on dynamic weight shifting and neuromuscular reeducation and control    Note: Portions of this document were prepared using Systems analyst and although reviewed may contain unintentional dictation errors in syntax, grammar, or spelling.   PATIENT EDUCATION: Education details: POC, test results  Person educated: Patient Education method: Explanation Education comprehension: verbalized understanding  HOME EXERCISE PROGRAM: Access Code: [email or web address] URL: [email or web address] Date: 10/06/2022 Prepared by: Rivka Barbara  Exercises - Standing March with Counter Support  - 1 x daily - 7 x weekly - 2 sets - 10 reps - 2 sec  hold - standing in split stance with vertical head nods   - 1 x daily - 7 x weekly - 2 sets - 15 reps - standing in split stance with vertical head nods   - 1 x daily - 7 x weekly - 2 sets - 15 reps  GOALS: Goals reviewed with patient? Yes  SHORT TERM GOALS: Target date: 10/10/2022   Patient will be independent in home exercise program to improve strength/mobility for better functional independence with  ADLs. Baseline: No HEP currently, lost HEP from last therapy  Goal status: INITIAL   LONG TERM GOALS: Target date: 11/10/2022   1.  Patient (> 46 years old) will complete five times sit to stand test in < 15 seconds indicating an increased LE strength and improved balance. Baseline: 17.3 sec Goal status: INITIAL  2.  Patient will increase FOTO score to equal to or greater than  65   to demonstrate statistically significant improvement in mobility and quality of life.  Baseline: 59 Goal status: INITIAL   3.  Patient will increase DGI Balance score by > 4 points to demonstrate decreased fall risk during functional activities. Baseline: 19 Goal status: INITIAL   4.  Patient will increase six minute walk test distance to >1300 for progression to community ambulator and improve gait ability Baseline: 1180 Goal status: INITIAL    ASSESSMENT:  CLINICAL IMPRESSION: Patient continues with general lower extremity strengthening for improvement and muscular power and strength.  Provided with initial home exercise program to work on her lower extremity control and her balance with dynamic activities.  Patient responds well this instructed to monitor knee pain during all exercises.  Patient still having knee pain with transitional movements such as sit to stands.  Pt will continue to benefit from skilled physical therapy intervention to address impairments, improve QOL, and attain therapy goals.     OBJECTIVE IMPAIRMENTS: Abnormal gait, decreased activity tolerance, decreased balance, decreased endurance, decreased mobility, and difficulty walking.   ACTIVITY LIMITATIONS: standing, squatting, stairs, and locomotion level  PARTICIPATION LIMITATIONS: community activity and yard work  PERSONAL FACTORS: Age and Past/current experiences are also affecting patient's functional outcome.   REHAB POTENTIAL: Good  CLINICAL DECISION MAKING: Stable/uncomplicated  EVALUATION COMPLEXITY:  Low  PLAN:  PT FREQUENCY: 1-2x/week  PT DURATION: 8 weeks  PLANNED INTERVENTIONS: Therapeutic exercises, Therapeutic activity, Neuromuscular re-education, Balance training, Gait training, Patient/Family education, Self Care, Joint mobilization, Stair training, Vestibular training, Dry Needling, Moist heat, and Manual therapy  PLAN FOR NEXT SESSION: Balance HEP, endurance as indicated, high level dynamic balance interventions     Particia Lather, PT 10/06/2022, 1:57 PM  1:57 PM, 10/06/22 Particia Lather PT ,DPT Physical Therapist- [facility]

## 2022-10-11 ENCOUNTER — Ambulatory Visit: Payer: 59

## 2022-10-11 DIAGNOSIS — R2689 Other abnormalities of gait and mobility: Secondary | ICD-10-CM

## 2022-10-11 DIAGNOSIS — R278 Other lack of coordination: Secondary | ICD-10-CM

## 2022-10-11 DIAGNOSIS — R2681 Unsteadiness on feet: Secondary | ICD-10-CM

## 2022-10-11 DIAGNOSIS — M6281 Muscle weakness (generalized): Secondary | ICD-10-CM

## 2022-10-11 NOTE — Therapy (Signed)
OUTPATIENT PHYSICAL THERAPY TREATMENT   Patient Name: [name] [name] MRN: [id number] DOB:1956-05-09, 67 y.o., female Today's Date: 10/11/2022   PCP: Waylan Rocher, MD  REFERRING PROVIDER: Vladimir Crofts, MD   END OF SESSION:  PT End of Session - 10/11/22 0852     Visit Number 5    Number of Visits 24    Date for PT Re-Evaluation 11/10/22    Authorization Type UHC; Medicaid of [place]    Progress Note Due on Visit 10    PT Start Time 0845    PT Stop Time 0925    PT Time Calculation (min) 40 min    Activity Tolerance Patient tolerated treatment well;No increased pain    Behavior During Therapy WFL for tasks assessed/performed               Past Medical History:  Diagnosis Date   Actinic keratosis    Acute hepatitis C without mention of hepatic coma(070.51)    Arthritis    fingers   Carpal tunnel syndrome of left wrist    Depression    GERD (gastroesophageal reflux disease)    Hypothyroidism    Insomnia    Insomnia    Memory loss    Osteoporosis    Squamous cell carcinoma of skin 12/27/2021   Left pretibial - [facility]   Tremor    Wears dentures    full upper, partial lower   Past Surgical History:  Procedure Laterality Date   ANKLE SURGERY Right    BLADDER TACT     CATARACT EXTRACTION W/PHACO Left 10/13/2021   Procedure: CATARACT EXTRACTION PHACO AND INTRAOCULAR LENS PLACEMENT ([place]) LEFT;  Surgeon: Leandrew Koyanagi, MD;  Location: [place];  Service: Ophthalmology;  Laterality: Left;  3.97 00:49.0   CATARACT EXTRACTION W/PHACO Right 10/27/2021   Procedure: CATARACT EXTRACTION PHACO AND INTRAOCULAR LENS PLACEMENT ([place]) RIGHT 4.50 00:58.9;  Surgeon: Leandrew Koyanagi, MD;  Location: [place];  Service: Ophthalmology;  Laterality: Right;   HAND SURGERY Right    TONSILLECTOMY     Patient Active Problem List   Diagnosis Date Noted   Hepatitis C 08/19/2016   Depression 08/19/2016   Insomnia 08/19/2016   Hypothyroidism 08/19/2016     ONSET DATE: 04/14/22  REFERRING DIAG: R26.9 (ICD-10-CM) - Abnormality of gait and mobility   THERAPY DIAG:  Unsteadiness on feet  Other abnormalities of gait and mobility  Muscle weakness (generalized)  Other lack of coordination  Rationale for Evaluation and Treatment: Rehabilitation  SUBJECTIVE:  SUBJECTIVE STATEMENT: No updates today. Pt denies any recent falls. HEP not performed due to 'memory problems.'  PERTINENT HISTORY: Changes since last time: Pt reports since last time she was seen here for therapy she has lost her balance quite a few times. Pt reports she loses her balance about once week. 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 Pt reports her last fall pt was at home and lost her balance and fell backwards. Prior to that she fell outside when there was soft and uneven ground she was not able to accommodate for. Pt reports she lost the exercises she had been discharged with from therapy. In addition pt is having new onset of right knee pain and feels she may need to get it replaced as she got other knee replaced in the past. Pt is familiar to this clinic and has been treated here for similar problems in the past.  PAIN:  Are you having pain? No pain, just aches in legs/knees   PRECAUTIONS: Fall WEIGHT BEARING RESTRICTIONS: No FALLS: Has patient fallen in last 6 months? Yes. Number of falls 10-12 (per patient)  PATIENT GOALS: Get up from floor better, get up from chairs better, improve balance, decrease falls.   OBJECTIVE:    TODAY'S TREATMENT:                                                                                                                              DATE: 10/11/22  -AA/ROM knees on Nustep x5 minutes  level 2  -AMB course: floor up to 5" step to airex to floor to [facility] foot tap, and finger tap, 180 degree turnaround  *seated interval  - with 3lb AW and 35f path, AMB over red mat, across 3" step, step over 1/2 roll and around chair, then turn and repeat x6 *seated recovery iinterval -repeat above but laterally x6 *sit break -AMB overground 6 minutes: 13515f no device needed, minimal signs of exertion.       PATIENT EDUCATION: Education details: POC, test results  Person educated: Patient Education method: Explanation Education comprehension: verbalized understanding  HOME EXERCISE PROGRAM:  Access Code: [email or web address] URL: [email or web address] Date: 10/06/2022 Prepared by: ChRivka Barbaranitiated and instructed in the following HEP   Exercises - Standing March with Counter Support  - 1 x daily - 7 x weekly - 2 sets - 10 reps - 2 sec  hold - standing in split stance with vertical head nods   - 1 x daily - 7 x weekly - 2 sets - 15 reps - standing in split stance with vertical head nods   - 1 x daily - 7 x weekly - 2 sets - 15 reps   Access Code: [email or web address] URL: [email or web address] Date: 10/06/2022 Prepared by: ChRivka BarbaraExercises - Standing March with Counter Support  - 1 x daily - 7 x weekly - 2 sets -  10 reps - 2 sec  hold - standing in split stance with vertical head nods   - 1 x daily - 7 x weekly - 2 sets - 15 reps - standing in split stance with vertical head nods   - 1 x daily - 7 x weekly - 2 sets - 15 reps  GOALS: Goals reviewed with patient? Yes  SHORT TERM GOALS: Target date: 10/10/2022   Patient will be independent in home exercise program to improve strength/mobility for better functional independence with ADLs. Baseline: No HEP currently, lost HEP from last therapy  Goal status: INITIAL   LONG TERM GOALS: Target date: 11/10/2022   1.  Patient (> 25 years old) will complete five times sit to stand test in < 15  seconds indicating an increased LE strength and improved balance. Baseline: 17.3 sec Goal status: INITIAL  2.  Patient will increase FOTO score to equal to or greater than  65   to demonstrate statistically significant improvement in mobility and quality of life.  Baseline: 59 Goal status: INITIAL   3.  Patient will increase DGI Balance score by > 4 points to demonstrate decreased fall risk during functional activities. Baseline: 19 Goal status: INITIAL  4.  Patient will increase six minute walk test distance to >1300 for progression to community ambulator and improve gait ability Baseline: 1180; 10/11/22: 1360f no device, no LOB Goal status: ACHIEVED     ASSESSMENT: CLINICAL IMPRESSION: Patient continues with general lower extremity strengthening for improvement and muscular power and strength. Pt moving quite fast today, very few LOB, high confident level. Pt tolerates session fairly well, but does require regular assist with LOB under more challenging circumstances. Finished with 6MWT, excellent performance, goal met, but still below age-matched norm values. Knee pain persists and seems to interfere with motivations but does not appear to alter movement patterns and righting today. Pt will continue to benefit from skilled physical therapy intervention to address impairments, improve QOL, and attain therapy goals.     OBJECTIVE IMPAIRMENTS: Abnormal gait, decreased activity tolerance, decreased balance, decreased endurance, decreased mobility, and difficulty walking.   ACTIVITY LIMITATIONS: standing, squatting, stairs, and locomotion level PARTICIPATION LIMITATIONS: community activity and yard work PERSONAL FACTORS: Age and Past/current experiences are also affecting patient's functional outcome.  REHAB POTENTIAL: Good CLINICAL DECISION MAKING: Stable/uncomplicated EVALUATION COMPLEXITY: Low  PLAN:  PT FREQUENCY: 1-2x/week PT DURATION: 8 weeks PLANNED INTERVENTIONS: Therapeutic  exercises, Therapeutic activity, Neuromuscular re-education, Balance training, Gait training, Patient/Family education, Self Care, Joint mobilization, Stair training, Vestibular training, Dry Needling, Moist heat, and Manual therapy  PLAN FOR NEXT SESSION: Balance HEP, endurance as indicated, high level dynamic balance interventions     [name] [name] C, PT 10/11/2022, 8:54 AM  8:54 AM, 10/11/22 AEtta GrandchildPT ,DPT Physical Therapist- [facility]

## 2022-10-13 ENCOUNTER — Ambulatory Visit: Payer: 59 | Admitting: Physical Therapy

## 2022-10-13 ENCOUNTER — Encounter: Payer: Self-pay | Admitting: Physical Therapy

## 2022-10-13 DIAGNOSIS — R2681 Unsteadiness on feet: Secondary | ICD-10-CM

## 2022-10-13 DIAGNOSIS — R2689 Other abnormalities of gait and mobility: Secondary | ICD-10-CM

## 2022-10-13 DIAGNOSIS — M6281 Muscle weakness (generalized): Secondary | ICD-10-CM

## 2022-10-13 NOTE — Therapy (Signed)
OUTPATIENT PHYSICAL THERAPY TREATMENT   Patient Name: [name] [name] MRN: [id number] DOB:Jan 11, 1956, 67 y.o., female Today's Date: 10/13/2022   PCP: Waylan Rocher, MD  REFERRING PROVIDER: Vladimir Crofts, MD   END OF SESSION:  PT End of Session - 10/13/22 0849     Visit Number 6    Number of Visits 24    Date for PT Re-Evaluation 11/10/22    Authorization Type UHC; Medicaid of [place]    Progress Note Due on Visit 10    PT Start Time 0845    PT Stop Time 0925    PT Time Calculation (min) 40 min    Activity Tolerance Patient tolerated treatment well;No increased pain    Behavior During Therapy WFL for tasks assessed/performed                Past Medical History:  Diagnosis Date   Actinic keratosis    Acute hepatitis C without mention of hepatic coma(070.51)    Arthritis    fingers   Carpal tunnel syndrome of left wrist    Depression    GERD (gastroesophageal reflux disease)    Hypothyroidism    Insomnia    Insomnia    Memory loss    Osteoporosis    Squamous cell carcinoma of skin 12/27/2021   Left pretibial - [facility]   Tremor    Wears dentures    full upper, partial lower   Past Surgical History:  Procedure Laterality Date   ANKLE SURGERY Right    BLADDER TACT     CATARACT EXTRACTION W/PHACO Left 10/13/2021   Procedure: CATARACT EXTRACTION PHACO AND INTRAOCULAR LENS PLACEMENT ([place]) LEFT;  Surgeon: Leandrew Koyanagi, MD;  Location: [place];  Service: Ophthalmology;  Laterality: Left;  3.97 00:49.0   CATARACT EXTRACTION W/PHACO Right 10/27/2021   Procedure: CATARACT EXTRACTION PHACO AND INTRAOCULAR LENS PLACEMENT ([place]) RIGHT 4.50 00:58.9;  Surgeon: Leandrew Koyanagi, MD;  Location: [place];  Service: Ophthalmology;  Laterality: Right;   HAND SURGERY Right    TONSILLECTOMY     Patient Active Problem List   Diagnosis Date Noted   Hepatitis C 08/19/2016   Depression 08/19/2016   Insomnia 08/19/2016   Hypothyroidism 08/19/2016     ONSET DATE: 04/14/22  REFERRING DIAG: R26.9 (ICD-10-CM) - Abnormality of gait and mobility   THERAPY DIAG:  Unsteadiness on feet  Other abnormalities of gait and mobility  Muscle weakness (generalized)  Rationale for Evaluation and Treatment: Rehabilitation  SUBJECTIVE:  SUBJECTIVE STATEMENT: Patient reports increased knee pain since last visit.  Initial portion of visit patient reports knee pain 7-8 out of 10 bilaterally.  Later in session patient likes to end session early secondary to unrelenting knee pain.  PERTINENT HISTORY: Changes since last time: Pt reports since last time she was seen here for therapy she has lost her balance quite a few times. Pt reports she loses her balance about once week. 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 Pt reports her last fall pt was at home and lost her balance and fell backwards. Prior to that she fell outside when there was soft and uneven ground she was not able to accommodate for. Pt reports she lost the exercises she had been discharged with from therapy. In addition pt is having new onset of right knee pain and feels she may need to get it replaced as she got other knee replaced in the past. Pt is familiar to this clinic and has been treated here for similar problems in the past.  PAIN:  Are you having pain?  Yes, 7-8 out of 10 pain in knees bilaterally with increased pain noted on the right side compared to the left  PRECAUTIONS: Fall WEIGHT BEARING RESTRICTIONS: No FALLS: Has patient fallen in last 6 months? Yes. Number of falls 10-12 (per patient)  PATIENT GOALS: Get up from floor better, get up from chairs better, improve balance, decrease falls.   OBJECTIVE:    TODAY'S TREATMENT:                                                                                                                               DATE: 10/13/22   -AA/ROM knees on Nustep x2 minutes level 2  -Pain noted to increase in knees bilaterally, got off and proceded to mat table.  Manual: Attempted on right lower extremity for pain modulation AP and PA glides as well as distraction patient noted increase in pain with both direction of glides and no reduction in pain with distraction.  Performing it very light grade 1 mobilizations for all of the above Passive range of motion into flexion and extension times several repetitions IASTM to quad hamstring and gastrocnemius patient reports no relief of symptoms with this activity  Balance with head nods x 10 to ea side on airex pad  Airex balance eyes closed 3 x 30 sec with normal stance  Hip abduciton in standing with 5# AW x 10 reps ea side  Marching and standing with 5 pound ankle weights x 10 reps on each side   Attempted balance course with wobble board, Airex beam, toe taps on hedgehog's, and Airex pad.  Patient had most difficulty with rocker board but experienced increased pain throughout and likes to end session early at this time.    Patient instructed to consider different avenues for treatment including but not limited  to visiting doctor, Recommended over-the-counter medications the doctor has previously been aware of and that she is tolerated well, particularly prior to completing therapy so therapy can be better tolerated without increased pain   PATIENT EDUCATION: Education details: Importance of monitoring knee pain and following up with proper channels for increased pain including her positions. Person educated: Patient Education method: Explanation Education comprehension: verbalized understanding  HOME EXERCISE PROGRAM:  Access Code: [email or web address] URL: [email or web address] Date: 10/06/2022 Prepared by: Rivka Barbara Initiated and instructed in the following HEP   Exercises - Standing March with Counter Support  - 1 x daily - 7 x weekly - 2 sets - 10 reps - 2 sec  hold - standing in split stance with vertical head nods   - 1 x daily - 7 x weekly - 2 sets - 15 reps - standing in split stance with vertical head nods   - 1 x daily - 7 x weekly - 2 sets - 15 reps   Access Code: [email or web address] URL: [email or web address] Date: 10/06/2022 Prepared by: Rivka Barbara  Exercises - Standing March with Counter Support  - 1 x daily - 7 x weekly - 2 sets - 10 reps - 2 sec  hold - standing in split stance with vertical head nods   - 1 x daily - 7 x weekly - 2 sets - 15 reps - standing in split stance with vertical head nods   - 1 x daily - 7 x weekly - 2 sets - 15 reps  GOALS: Goals reviewed with patient? Yes  SHORT TERM GOALS: Target date: 10/10/2022   Patient will be independent in home exercise program to improve strength/mobility for better functional independence with ADLs. Baseline: No HEP currently, lost HEP from last therapy  Goal status: INITIAL   LONG TERM GOALS: Target date: 11/10/2022   1.  Patient (> 44 years old) will complete five times sit to stand test in < 15 seconds indicating an increased LE strength and improved balance. Baseline: 17.3 sec Goal status: INITIAL  2.  Patient will increase FOTO score to equal to or greater than  65   to demonstrate statistically significant improvement in mobility and quality of life.  Baseline: 59 Goal status: INITIAL   3.  Patient will increase DGI Balance score by > 4 points to demonstrate decreased fall risk during functional activities. Baseline: 19 Goal status: INITIAL  4.  Patient will increase six minute walk test distance to >1300 for progression to community ambulator and improve gait ability Baseline: 1180; 10/11/22: 1342f no device, no LOB Goal status: ACHIEVED     ASSESSMENT: CLINICAL IMPRESSION: Patient presents  with good motivation for completion of physical therapy activities.  Loss of activities limited by patient's bilateral knee pain this date several activities had to be modified and or not completed secondary to this pain and discomfort.  Patient next insertion a few minutes early secondary to pain and discomfort.  Patient instructed to contact physician if pain continues to be at this level.  Patient verbalizes understanding.  Patient also instructed she should consider taking over the counter medications to alleviate her inflammation and pain prior to therapy instead of taking it later in the day as she has been completing now. Pt will continue to benefit from skilled physical therapy intervention to address impairments, improve QOL, and attain therapy goals.      OBJECTIVE IMPAIRMENTS: Abnormal gait, decreased activity tolerance, decreased balance, decreased endurance, decreased mobility, and difficulty walking.   ACTIVITY  LIMITATIONS: standing, squatting, stairs, and locomotion level PARTICIPATION LIMITATIONS: community activity and yard work PERSONAL FACTORS: Age and Past/current experiences are also affecting patient's functional outcome.  REHAB POTENTIAL: Good CLINICAL DECISION MAKING: Stable/uncomplicated EVALUATION COMPLEXITY: Low  PLAN:  PT FREQUENCY: 1-2x/week PT DURATION: 8 weeks PLANNED INTERVENTIONS: Therapeutic exercises, Therapeutic activity, Neuromuscular re-education, Balance training, Gait training, Patient/Family education, Self Care, Joint mobilization, Stair training, Vestibular training, Dry Needling, Moist heat, and Manual therapy  PLAN FOR NEXT SESSION: Balance HEP, endurance as indicated, high level dynamic balance interventions     Particia Lather, PT 10/13/2022, 9:33 AM  9:33 AM, 10/13/22 Particia Lather PT ,DPT Physical Therapist- [facility]

## 2022-10-18 ENCOUNTER — Ambulatory Visit: Payer: 59 | Attending: Neurology | Admitting: Physical Therapy

## 2022-10-18 DIAGNOSIS — R2681 Unsteadiness on feet: Secondary | ICD-10-CM

## 2022-10-18 DIAGNOSIS — R2689 Other abnormalities of gait and mobility: Secondary | ICD-10-CM

## 2022-10-18 DIAGNOSIS — M6281 Muscle weakness (generalized): Secondary | ICD-10-CM | POA: Diagnosis present

## 2022-10-18 NOTE — Therapy (Signed)
OUTPATIENT PHYSICAL THERAPY TREATMENT   Patient Name: [name] [name] MRN: [id number] DOB:1956-05-22, 67 y.o., female Today's Date: 10/18/2022   PCP: Waylan Rocher, MD  REFERRING PROVIDER: Vladimir Crofts, MD   END OF SESSION:  PT End of Session - 10/18/22 0846     Visit Number 7    Number of Visits 24    Date for PT Re-Evaluation 11/10/22    Authorization Type UHC; Medicaid of [place]    Progress Note Due on Visit 10    PT Start Time [phone]    PT Stop Time 0926    PT Time Calculation (min) 40 min    Equipment Utilized During Treatment Gait belt    Activity Tolerance Patient tolerated treatment well;No increased pain    Behavior During Therapy WFL for tasks assessed/performed                 Past Medical History:  Diagnosis Date   Actinic keratosis    Acute hepatitis C without mention of hepatic coma(070.51)    Arthritis    fingers   Carpal tunnel syndrome of left wrist    Depression    GERD (gastroesophageal reflux disease)    Hypothyroidism    Insomnia    Insomnia    Memory loss    Osteoporosis    Squamous cell carcinoma of skin 12/27/2021   Left pretibial - [facility]   Tremor    Wears dentures    full upper, partial lower   Past Surgical History:  Procedure Laterality Date   ANKLE SURGERY Right    BLADDER TACT     CATARACT EXTRACTION W/PHACO Left 10/13/2021   Procedure: CATARACT EXTRACTION PHACO AND INTRAOCULAR LENS PLACEMENT ([place]) LEFT;  Surgeon: Leandrew Koyanagi, MD;  Location: [facility];  Service: Ophthalmology;  Laterality: Left;  3.97 00:49.0   CATARACT EXTRACTION W/PHACO Right 10/27/2021   Procedure: CATARACT EXTRACTION PHACO AND INTRAOCULAR LENS PLACEMENT ([place]) RIGHT 4.50 00:58.9;  Surgeon: Leandrew Koyanagi, MD;  Location: [place];  Service: Ophthalmology;  Laterality: Right;   HAND SURGERY Right    TONSILLECTOMY     Patient Active Problem List   Diagnosis Date Noted   Hepatitis C 08/19/2016   Depression 08/19/2016    Insomnia 08/19/2016   Hypothyroidism 08/19/2016    ONSET DATE: 04/14/22  REFERRING DIAG: R26.9 (ICD-10-CM) - Abnormality of gait and mobility   THERAPY DIAG:  No diagnosis found.  Rationale for Evaluation and Treatment: Rehabilitation  SUBJECTIVE:  SUBJECTIVE STATEMENT: Pt reports she is feeling better since last visit. She took OTC meds for her knee and the pain is much more tolerable.   PERTINENT HISTORY: Changes since last time: Pt reports since last time she was seen here for therapy she has lost her balance quite a few times. Pt reports she loses her balance about once week. 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 Pt reports her last fall pt was at home and lost her balance and fell backwards. Prior to that she fell outside when there was soft and uneven ground she was not able to accommodate for. Pt reports she lost the exercises she had been discharged with from therapy. In addition pt is having new onset of right knee pain and feels she may need to get it replaced as she got other knee replaced in the past. Pt is familiar to this clinic and has been treated here for similar problems in the past.  PAIN:  Are you having pain?  Yes, 2 out of 10 pain in knees bilaterally with increased pain noted on the right side compared to the left  PRECAUTIONS: Fall WEIGHT BEARING RESTRICTIONS: No FALLS: Has patient fallen in last 6 months? Yes. Number of falls 10-12 (per patient)  PATIENT GOALS: Get up from floor better, get up from chairs better, improve balance, decrease falls.   OBJECTIVE:    TODAY'S TREATMENT:                                                                                                                               DATE: 10/18/22  TE Seated LAQ x 10 reps ea LE  Standing knee flexion x 10 ea LE with 5# AW  Hip abduciton in standing with 5# AW x 10 reps ea side  Marching and standing with 5 pound ankle weights x 10 reps on each side NMR [place] Balance machine  X 3 trials on tux racer working on dynamic balance and dual task balance.  Balance with head nods x 15 to ea side on airex pad  Airex balance eyes closed 3 x 30 sec with normal stance Good control but requires UE assist throughout for balance  Big balance board x 20 forward and back ea LE.  Sidestepping on "healthy you" balance beam with UE support x 1-2 min   TE Nustep x 4 min level 2        PATIENT EDUCATION: Education details: Importance of monitoring knee pain and following up with proper channels for increased pain including her positions. Person educated: Patient Education method: Explanation Education comprehension: verbalized understanding  HOME EXERCISE PROGRAM:  Access Code: [email or web address] URL: [email or web address] Date: 10/06/2022 Prepared by: Rivka Barbara Initiated and instructed in the following HEP   Exercises - Standing March with Counter Support  - 1 x daily - 7 x weekly - 2 sets - 10 reps -  2 sec  hold - standing in split stance with vertical head nods   - 1 x daily - 7 x weekly - 2 sets - 15 reps - standing in split stance with vertical head nods   - 1 x daily - 7 x weekly - 2 sets - 15 reps   Access Code: [email or web address] URL: [email or web address] Date: 10/06/2022 Prepared by: Rivka Barbara  Exercises - Standing March with Counter Support  - 1 x daily - 7 x weekly - 2 sets - 10 reps - 2 sec  hold - standing in split stance with vertical head nods   - 1 x daily - 7 x weekly - 2 sets - 15 reps - standing in split stance with vertical head nods   - 1 x daily - 7 x weekly - 2 sets - 15 reps  GOALS: Goals reviewed with patient? Yes  SHORT TERM GOALS: Target date: 10/10/2022    Patient will be independent in home exercise program to improve strength/mobility for better functional independence with ADLs. Baseline: No HEP currently, lost HEP from last therapy  Goal status: INITIAL   LONG TERM GOALS: Target date: 11/10/2022   1.  Patient (> 74 years old) will complete five times sit to stand test in < 15 seconds indicating an increased LE strength and improved balance. Baseline: 17.3 sec Goal status: INITIAL  2.  Patient will increase FOTO score to equal to or greater than  65   to demonstrate statistically significant improvement in mobility and quality of life.  Baseline: 59 Goal status: INITIAL   3.  Patient will increase DGI Balance score by > 4 points to demonstrate decreased fall risk during functional activities. Baseline: 19 Goal status: INITIAL  4.  Patient will increase six minute walk test distance to >1300 for progression to community ambulator and improve gait ability Baseline: 1180; 10/11/22: 1368f no device, no LOB Goal status: ACHIEVED     ASSESSMENT: CLINICAL IMPRESSION: Patient presents with good motivation for completion of physical therapy activities.  Continued with current plan of care as laid out in evaluation and recent prior sessions. Pt remains motivated to advance progress toward goals in order to maximize independence and safety at home. Pt requires high level assistance and cuing for completion of exercises in order to provide adequate level of stimulation and perturbation. Author allows pt as much opportunity as possible to perform independent righting strategies, only stepping in when pt is unable to prevent falling to floor. Pt closely monitored throughout session for safe vitals response and to maximize patient safety during interventions. Pt continues to demonstrate progress toward goals AEB progression of some interventions this date either in volume or intensity. Pt will continue to benefit from skilled physical therapy  intervention to address impairments, improve QOL, and attain therapy goals.      OBJECTIVE IMPAIRMENTS: Abnormal gait, decreased activity tolerance, decreased balance, decreased endurance, decreased mobility, and difficulty walking.   ACTIVITY LIMITATIONS: standing, squatting, stairs, and locomotion level PARTICIPATION LIMITATIONS: community activity and yard work PERSONAL FACTORS: Age and Past/current experiences are also affecting patient's functional outcome.  REHAB POTENTIAL: Good CLINICAL DECISION MAKING: Stable/uncomplicated EVALUATION COMPLEXITY: Low  PLAN:  PT FREQUENCY: 1-2x/week PT DURATION: 8 weeks PLANNED INTERVENTIONS: Therapeutic exercises, Therapeutic activity, Neuromuscular re-education, Balance training, Gait training, Patient/Family education, Self Care, Joint mobilization, Stair training, Vestibular training, Dry Needling, Moist heat, and Manual therapy  PLAN FOR NEXT SESSION: Balance HEP, endurance as indicated, high level dynamic balance interventions  Particia Lather, PT 10/18/2022, 8:47 AM  8:47 AM, 10/18/22 Particia Lather PT ,DPT Physical Therapist- [facility]

## 2022-10-20 ENCOUNTER — Other Ambulatory Visit: Payer: Self-pay | Admitting: Family Medicine

## 2022-10-20 ENCOUNTER — Ambulatory Visit: Payer: 59 | Admitting: Physical Therapy

## 2022-10-20 ENCOUNTER — Ambulatory Visit
Admission: RE | Admit: 2022-10-20 | Discharge: 2022-10-20 | Disposition: A | Discharge status: Home / Self Care | Payer: 59 | Source: Ambulatory Visit | Attending: Family Medicine | Admitting: Family Medicine

## 2022-10-20 ENCOUNTER — Ambulatory Visit
Admission: RE | Admit: 2022-10-20 | Discharge: 2022-10-20 | Disposition: A | Discharge status: Home / Self Care | Payer: 59 | Attending: Family Medicine | Admitting: Family Medicine

## 2022-10-20 DIAGNOSIS — M6281 Muscle weakness (generalized): Secondary | ICD-10-CM

## 2022-10-20 DIAGNOSIS — R059 Cough, unspecified: Secondary | ICD-10-CM

## 2022-10-20 DIAGNOSIS — R2681 Unsteadiness on feet: Secondary | ICD-10-CM

## 2022-10-20 DIAGNOSIS — R269 Unspecified abnormalities of gait and mobility: Secondary | ICD-10-CM | POA: Diagnosis not present

## 2022-10-20 DIAGNOSIS — R2689 Other abnormalities of gait and mobility: Secondary | ICD-10-CM

## 2022-10-20 NOTE — Therapy (Signed)
OUTPATIENT PHYSICAL THERAPY TREATMENT   Patient Name: [name] [name] MRN: [id number] DOB:1955/12/13, 67 y.o., female Today's Date: 10/20/2022   PCP: Waylan Rocher, MD  REFERRING PROVIDER: Vladimir Crofts, MD   END OF SESSION:  PT End of Session - 10/20/22 0848     Visit Number 8    Number of Visits 24    Date for PT Re-Evaluation 11/10/22    Authorization Type UHC; Medicaid of [place]    Progress Note Due on Visit 10    PT Start Time [phone]    PT Stop Time 0930    PT Time Calculation (min) 41 min    Equipment Utilized During Treatment Gait belt    Activity Tolerance Patient tolerated treatment well;No increased pain    Behavior During Therapy WFL for tasks assessed/performed                 Past Medical History:  Diagnosis Date   Actinic keratosis    Acute hepatitis C without mention of hepatic coma(070.51)    Arthritis    fingers   Carpal tunnel syndrome of left wrist    Depression    GERD (gastroesophageal reflux disease)    Hypothyroidism    Insomnia    Insomnia    Memory loss    Osteoporosis    Squamous cell carcinoma of skin 12/27/2021   Left pretibial - [facility]   Tremor    Wears dentures    full upper, partial lower   Past Surgical History:  Procedure Laterality Date   ANKLE SURGERY Right    BLADDER TACT     CATARACT EXTRACTION W/PHACO Left 10/13/2021   Procedure: CATARACT EXTRACTION PHACO AND INTRAOCULAR LENS PLACEMENT ([place]) LEFT;  Surgeon: Leandrew Koyanagi, MD;  Location: [place];  Service: Ophthalmology;  Laterality: Left;  3.97 00:49.0   CATARACT EXTRACTION W/PHACO Right 10/27/2021   Procedure: CATARACT EXTRACTION PHACO AND INTRAOCULAR LENS PLACEMENT ([place]) RIGHT 4.50 00:58.9;  Surgeon: Leandrew Koyanagi, MD;  Location: [place];  Service: Ophthalmology;  Laterality: Right;   HAND SURGERY Right    TONSILLECTOMY     Patient Active Problem List   Diagnosis Date Noted   Hepatitis C 08/19/2016   Depression 08/19/2016    Insomnia 08/19/2016   Hypothyroidism 08/19/2016    ONSET DATE: 04/14/22  REFERRING DIAG: R26.9 (ICD-10-CM) - Abnormality of gait and mobility   THERAPY DIAG:  Unsteadiness on feet  Other abnormalities of gait and mobility  Muscle weakness (generalized)  Rationale for Evaluation and Treatment: Rehabilitation  SUBJECTIVE:  SUBJECTIVE STATEMENT: Patient reports she is doing well outside of continued worry about her mechanical issues with her car.  She reports she might [place] to the [place] in order to get enough miles to pass  PERTINENT HISTORY: Changes since last time: Pt reports since last time she was seen here for therapy she has lost her balance quite a few times. Pt reports she loses her balance about once week. 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 Pt reports her last fall pt was at home and lost her balance and fell backwards. Prior to that she fell outside when there was soft and uneven ground she was not able to accommodate for. Pt reports she lost the exercises she had been discharged with from therapy. In addition pt is having new onset of right knee pain and feels she may need to get it replaced as she got other knee replaced in the past. Pt is familiar to this clinic and has been treated here for similar problems in the past.  PAIN:  Are you having pain?  Yes, 2 out of 10 pain in knees bilaterally with increased pain noted on the right side compared to the left  PRECAUTIONS: Fall WEIGHT BEARING RESTRICTIONS: No FALLS: Has patient fallen in last 6 months? Yes. Number of falls 10-12 (per patient)  PATIENT GOALS: Get up from floor better, get up from chairs better, improve balance, decrease falls.   OBJECTIVE:     TODAY'S TREATMENT:                                                                                                                              DATE: 10/20/22  TE Seated LAQ x 10 reps ea LE  Standing knee flexion x 10 ea LE with 5# AW  Hip abduciton in standing with 5# AW x 10 reps ea side  Marching and standing with 5 pound ankle weights x 10 reps on each side NMR Dynamic Balance ambulation training - walking x 3 rounds each for 30-40 feet - lateral head turns x 3 rounds with dual task of reading objects - retro walking, eyes closed walking, and tandem balance x 3 rounds each  -Airex balance eyes closed 3 x 30 sec with normal stance Good control but requires UE assist throughout for balance  -x 10 lateral and horizontal head turns -stance on BOSU hard side x 60 seconds, intermittent UE used as needed CGA to min A getting on / off of BOSU   TE  -Nustep x 5 min level 3       PATIENT EDUCATION: Education details: Importance of monitoring knee pain and following up with proper channels for increased pain including her positions. Person educated: Patient Education method: Explanation Education comprehension: verbalized understanding  HOME EXERCISE PROGRAM:  Access Code: [email or web address] URL: [email or web address] Date: 10/06/2022 Prepared by: Rivka Barbara Initiated and instructed in  the following HEP   Exercises - Standing March with Counter Support  - 1 x daily - 7 x weekly - 2 sets - 10 reps - 2 sec  hold - standing in split stance with vertical head nods   - 1 x daily - 7 x weekly - 2 sets - 15 reps - standing in split stance with vertical head nods   - 1 x daily - 7 x weekly - 2 sets - 15 reps   Access Code: [email or web address] URL: [email or web address] Date: 10/06/2022 Prepared by: Rivka Barbara  Exercises - Standing March with Counter Support  - 1 x daily - 7 x weekly - 2 sets - 10 reps - 2 sec  hold - standing in split stance with  vertical head nods   - 1 x daily - 7 x weekly - 2 sets - 15 reps - standing in split stance with vertical head nods   - 1 x daily - 7 x weekly - 2 sets - 15 reps  GOALS: Goals reviewed with patient? Yes  SHORT TERM GOALS: Target date: 10/10/2022   Patient will be independent in home exercise program to improve strength/mobility for better functional independence with ADLs. Baseline: No HEP currently, lost HEP from last therapy  Goal status: INITIAL   LONG TERM GOALS: Target date: 11/10/2022   1.  Patient (> 83 years old) will complete five times sit to stand test in < 15 seconds indicating an increased LE strength and improved balance. Baseline: 17.3 sec Goal status: INITIAL  2.  Patient will increase FOTO score to equal to or greater than  65   to demonstrate statistically significant improvement in mobility and quality of life.  Baseline: 59 Goal status: INITIAL   3.  Patient will increase DGI Balance score by > 4 points to demonstrate decreased fall risk during functional activities. Baseline: 19 Goal status: INITIAL  4.  Patient will increase six minute walk test distance to >1300 for progression to community ambulator and improve gait ability Baseline: 1180; 10/11/22: 1320f no device, no LOB Goal status: ACHIEVED     ASSESSMENT: CLINICAL IMPRESSION:  Patient presents with good motivation for completion of physical therapy activities.  Continued with current plan of care as laid out in evaluation and recent prior sessions. Pt remains motivated to advance progress toward goals in order to maximize independence and safety at home. Pt requires high level assistance and cuing for completion of exercises in order to provide adequate level of stimulation and perturbation. Author allows pt as much opportunity as possible to perform independent righting strategies, only stepping in when pt is unable to prevent falling to floor. Pt progresses with balance interventions with dynamic  walking, has difficulty with tandem and eyes closed gait more than others.  Pt will continue to benefit from skilled physical therapy intervention to address impairments, improve QOL, and attain therapy goals.      OBJECTIVE IMPAIRMENTS: Abnormal gait, decreased activity tolerance, decreased balance, decreased endurance, decreased mobility, and difficulty walking.   ACTIVITY LIMITATIONS: standing, squatting, stairs, and locomotion level PARTICIPATION LIMITATIONS: community activity and yard work PERSONAL FACTORS: Age and Past/current experiences are also affecting patient's functional outcome.  REHAB POTENTIAL: Good CLINICAL DECISION MAKING: Stable/uncomplicated EVALUATION COMPLEXITY: Low  PLAN:  PT FREQUENCY: 1-2x/week PT DURATION: 8 weeks PLANNED INTERVENTIONS: Therapeutic exercises, Therapeutic activity, Neuromuscular re-education, Balance training, Gait training, Patient/Family education, Self Care, Joint mobilization, Stair training, Vestibular training, Dry Needling, Moist heat, and Manual therapy  PLAN FOR NEXT  SESSION: Balance HEP, endurance as indicated, high level dynamic balance interventions     Particia Lather, PT 10/20/2022, 10:30 AM  10:30 AM, 10/20/22 Particia Lather PT ,DPT Physical Therapist- [facility]

## 2022-10-27 ENCOUNTER — Ambulatory Visit: Payer: 59 | Admitting: Physical Therapy

## 2022-10-31 NOTE — Therapy (Unsigned)
OUTPATIENT PHYSICAL THERAPY TREATMENT   Patient Name: [name] [name] MRN: [id number] DOB:1956-07-25, 67 y.o., female Today's Date: 11/01/2022   PCP: Waylan Rocher, MD  REFERRING PROVIDER: Vladimir Crofts, MD   END OF SESSION:  PT End of Session - 11/01/22 0828     Visit Number 9    Number of Visits 24    Date for PT Re-Evaluation 11/10/22    Authorization Type UHC; Medicaid of [place]    Progress Note Due on Visit 10    PT Start Time 0821    PT Stop Time 0847    PT Time Calculation (min) 26 min    Equipment Utilized During Treatment Gait belt    Activity Tolerance Patient tolerated treatment well;No increased pain    Behavior During Therapy WFL for tasks assessed/performed                  Past Medical History:  Diagnosis Date   Actinic keratosis    Acute hepatitis C without mention of hepatic coma(070.51)    Arthritis    fingers   Carpal tunnel syndrome of left wrist    Depression    GERD (gastroesophageal reflux disease)    Hypothyroidism    Insomnia    Insomnia    Memory loss    Osteoporosis    Squamous cell carcinoma of skin 12/27/2021   Left pretibial - [facility]   Tremor    Wears dentures    full upper, partial lower   Past Surgical History:  Procedure Laterality Date   ANKLE SURGERY Right    BLADDER TACT     CATARACT EXTRACTION W/PHACO Left 10/13/2021   Procedure: CATARACT EXTRACTION PHACO AND INTRAOCULAR LENS PLACEMENT ([place]) LEFT;  Surgeon: Leandrew Koyanagi, MD;  Location: [place];  Service: Ophthalmology;  Laterality: Left;  3.97 00:49.0   CATARACT EXTRACTION W/PHACO Right 10/27/2021   Procedure: CATARACT EXTRACTION PHACO AND INTRAOCULAR LENS PLACEMENT ([place]) RIGHT 4.50 00:58.9;  Surgeon: Leandrew Koyanagi, MD;  Location: [place];  Service: Ophthalmology;  Laterality: Right;   HAND SURGERY Right    TONSILLECTOMY     Patient Active Problem List   Diagnosis Date Noted   Hepatitis C 08/19/2016   Depression  08/19/2016   Insomnia 08/19/2016   Hypothyroidism 08/19/2016    ONSET DATE: 04/14/22  REFERRING DIAG: R26.9 (ICD-10-CM) - Abnormality of gait and mobility   THERAPY DIAG:  Unsteadiness on feet  Other abnormalities of gait and mobility  Muscle weakness (generalized)  Rationale for Evaluation and Treatment: Rehabilitation  SUBJECTIVE:  SUBJECTIVE STATEMENT: Pt reports she had a fall last Wednesday resulting in rib fracture. It was limiting for a few days but has improved some and only bothers with certain movement sand when walking up in the AM.   PERTINENT HISTORY: Changes since last time: Pt reports since last time she was seen here for therapy she has lost her balance quite a few times. Pt reports she loses her balance about once week. 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 Pt reports her last fall pt was at home and lost her balance and fell backwards. Prior to that she fell outside when there was soft and uneven ground she was not able to accommodate for. Pt reports she lost the exercises she had been discharged with from therapy. In addition pt is having new onset of right knee pain and feels she may need to get it replaced as she got other knee replaced in the past. Pt is familiar to this clinic and has been treated here for similar problems in the past.  PAIN:  Are you having pain?  Yes, 2 out of 10 pain in knees bilaterally with increased pain noted on the right side compared to the left  PRECAUTIONS: Fall WEIGHT BEARING RESTRICTIONS: No FALLS: Has patient fallen in last 6 months? Yes. Number of falls 10-12 (per patient)  PATIENT GOALS: Get up from floor better, get up from chairs better, improve balance, decrease falls.    OBJECTIVE:    TODAY'S TREATMENT:                                                                                                                              DATE: 11/01/22  TE- 5# AW donned Seated LAQ x 10 reps ea LE  Standing knee flexion x 10 ea LE with 5# AW  Hip abduciton in standing with 5# AW x 10 reps ea side  Marching seated with 5 pound ankle weights x 10 reps on each side  NMR  Dynamic Balance ambulation training - walking x 2 rounds each for 30-40 feet - retro walking, eyes closed walking, and tandem walking x 3 rounds each with rest between reps   -BOSU balance eyes closed 3 x 60 sec with normal stance Good control but requires UE assist throughout for balance         PATIENT EDUCATION: Education details: Importance of monitoring knee pain and following up with proper channels for increased pain including her positions. Person educated: Patient Education method: Explanation Education comprehension: verbalized understanding  HOME EXERCISE PROGRAM:  Access Code: [email or web address] URL: [email or web address] Date: 10/06/2022 Prepared by: Rivka Barbara Initiated and instructed in the following HEP   Exercises - Standing March with Counter Support  - 1 x daily - 7 x weekly - 2 sets - 10 reps - 2 sec  hold - standing in split stance with vertical head nods   -  1 x daily - 7 x weekly - 2 sets - 15 reps - standing in split stance with vertical head nods   - 1 x daily - 7 x weekly - 2 sets - 15 reps   Access Code: [email or web address] URL: [email or web address] Date: 10/06/2022 Prepared by: Rivka Barbara  Exercises - Standing March with Counter Support  - 1 x daily - 7 x weekly - 2 sets - 10 reps - 2 sec  hold - standing in split stance with vertical head nods   - 1 x daily - 7 x weekly - 2 sets - 15 reps - standing in split stance with vertical head nods   - 1 x daily - 7 x weekly - 2 sets - 15 reps  GOALS: Goals reviewed with patient?  Yes  SHORT TERM GOALS: Target date: 10/10/2022   Patient will be independent in home exercise program to improve strength/mobility for better functional independence with ADLs. Baseline: No HEP currently, lost HEP from last therapy  Goal status: INITIAL   LONG TERM GOALS: Target date: 11/10/2022   1.  Patient (> 23 years old) will complete five times sit to stand test in < 15 seconds indicating an increased LE strength and improved balance. Baseline: 17.3 sec Goal status: INITIAL  2.  Patient will increase FOTO score to equal to or greater than  65   to demonstrate statistically significant improvement in mobility and quality of life.  Baseline: 59 Goal status: INITIAL   3.  Patient will increase DGI Balance score by > 4 points to demonstrate decreased fall risk during functional activities. Baseline: 19 Goal status: INITIAL  4.  Patient will increase six minute walk test distance to >1300 for progression to community ambulator and improve gait ability Baseline: 1180; 10/11/22: 1330ft no device, no LOB Goal status: ACHIEVED     ASSESSMENT: CLINICAL IMPRESSION:  Patient presents with good motivation for completion of physical therapy activities.  Continued with current plan of care as laid out in evaluation and recent prior sessions. Pt remains motivated to advance progress toward goals in order to maximize independence and safety at home. Pt requires high level assistance and cuing for completion of exercises in order to provide adequate level of stimulation and perturbation. Author allows pt as much opportunity as possible to perform independent righting strategies, only stepping in when pt is unable to prevent falling to floor. Pt progresses with balance interventions with dynamic walking, has difficulty with tandem and eyes closed gait more than others and had consistent list to the right with EC ambulation.  Pt will continue to benefit from skilled physical therapy intervention to  address impairments, improve QOL, and attain therapy goals.      OBJECTIVE IMPAIRMENTS: Abnormal gait, decreased activity tolerance, decreased balance, decreased endurance, decreased mobility, and difficulty walking.   ACTIVITY LIMITATIONS: standing, squatting, stairs, and locomotion level PARTICIPATION LIMITATIONS: community activity and yard work PERSONAL FACTORS: Age and Past/current experiences are also affecting patient's functional outcome.  REHAB POTENTIAL: Good CLINICAL DECISION MAKING: Stable/uncomplicated EVALUATION COMPLEXITY: Low  PLAN:  PT FREQUENCY: 1-2x/week PT DURATION: 8 weeks PLANNED INTERVENTIONS: Therapeutic exercises, Therapeutic activity, Neuromuscular re-education, Balance training, Gait training, Patient/Family education, Self Care, Joint mobilization, Stair training, Vestibular training, Dry Needling, Moist heat, and Manual therapy  PLAN FOR NEXT SESSION: Balance HEP, endurance as indicated, high level dynamic balance interventions     Particia Lather, PT 11/01/2022, 9:35 AM  9:35 AM, 11/01/22 Particia Lather PT ,DPT Physical Therapist- [facility]

## 2022-11-01 ENCOUNTER — Institutional Professional Consult (permissible substitution): Payer: 59 | Admitting: Internal Medicine

## 2022-11-01 ENCOUNTER — Ambulatory Visit: Payer: 59 | Admitting: Physical Therapy

## 2022-11-01 DIAGNOSIS — R2681 Unsteadiness on feet: Secondary | ICD-10-CM

## 2022-11-01 DIAGNOSIS — M6281 Muscle weakness (generalized): Secondary | ICD-10-CM

## 2022-11-01 DIAGNOSIS — R2689 Other abnormalities of gait and mobility: Secondary | ICD-10-CM

## 2022-11-03 ENCOUNTER — Ambulatory Visit: Payer: 59 | Admitting: Physical Therapy

## 2022-11-07 ENCOUNTER — Encounter: Payer: Self-pay | Admitting: Pulmonary Disease

## 2022-11-07 ENCOUNTER — Ambulatory Visit (INDEPENDENT_AMBULATORY_CARE_PROVIDER_SITE_OTHER): Payer: 59 | Admitting: Pulmonary Disease

## 2022-11-07 VITALS — BP 120/78 | HR 95 | Temp 97.7°F | Ht 65.0 in | Wt 177.0 lb

## 2022-11-07 DIAGNOSIS — R9389 Abnormal findings on diagnostic imaging of other specified body structures: Secondary | ICD-10-CM | POA: Diagnosis not present

## 2022-11-07 DIAGNOSIS — J189 Pneumonia, unspecified organism: Secondary | ICD-10-CM | POA: Diagnosis not present

## 2022-11-07 MED ORDER — AZITHROMYCIN 250 MG PO TABS: ORAL_TABLET | ORAL | 0 refills | Status: AC

## 2022-11-07 MED ORDER — CEFPODOXIME PROXETIL 200 MG PO TABS: 200.0000 mg | ORAL_TABLET | Freq: Two times a day (BID) | ORAL | 0 refills | Status: AC

## 2022-11-07 NOTE — Patient Instructions (Signed)
I think you have a case of pneumonia which appears to be mild.  We have sent antibiotics to your pharmacy you will take these for 5 days.  Make sure you complete both antibiotics.  We will see you in follow-up in 4 to 6 weeks time.  We will get a chest x-ray when you return appointment.

## 2022-11-07 NOTE — Progress Notes (Signed)
Subjective:    Patient ID: [name] [name], female    DOB: Mar 26, 1956, 67 y.o.   MRN: [id number] Patient Care Team: Oris Drone Mendel Corning, MD as PCP - General (Internal Medicine)  Chief Complaint  Patient presents with   Consult    Nodule. SOB with exertion. Some wheezing. Cough with green sputum.   HPI The patient is a 67 year old remote former smoker with a total of 10-pack-year history of smoking who presents for evaluation of a "lung nodule".  She is kindly referred by Dr. Farrel Gordon.  The patient states that the last 6 months she has had a cough that has been nonproductive.  However approximately 2 weeks ago the cough became more pronounced and a chest x-ray was obtained showing nodularity of the right lung apex which may reflect multifocal infection.  The patient has not endorse any fevers, chills or sweats.  Since her cough started approximately 6 months ago she has noted 3 instances where she had shortness of breath on going up stairs or inclines.  Normally does not have any dyspnea.  Last noted shortness of breath during the Christmas [place] and her hometown.  Over the last 2 days she has noted some wheezing, she has had sputum production which is new and it is green.  He also has noted increasing problems with gastroesophageal reflux worse yesterday.  She has not had any chest pain.  She does not endorse any hemoptysis.  No other symptomatology noted.  Review of Systems A 10 point review of systems was performed and it is as noted above otherwise negative.  Past Medical History:  Diagnosis Date   Actinic keratosis    Acute hepatitis C without mention of hepatic coma(070.51)    Arthritis    fingers   Carpal tunnel syndrome of left wrist    Depression    GERD (gastroesophageal reflux disease)    Hypothyroidism    Insomnia    Insomnia    Memory loss    Osteoporosis    Squamous cell carcinoma of skin 12/27/2021   Left pretibial - [facility]   Tremor    Wears dentures     full upper, partial lower   Past Surgical History:  Procedure Laterality Date   ANKLE SURGERY Right    BLADDER TACT     CATARACT EXTRACTION W/PHACO Left 10/13/2021   Procedure: CATARACT EXTRACTION PHACO AND INTRAOCULAR LENS PLACEMENT ([place]) LEFT;  Surgeon: Leandrew Koyanagi, MD;  Location: [place];  Service: Ophthalmology;  Laterality: Left;  3.97 00:49.0   CATARACT EXTRACTION W/PHACO Right 10/27/2021   Procedure: CATARACT EXTRACTION PHACO AND INTRAOCULAR LENS PLACEMENT ([place]) RIGHT 4.50 00:58.9;  Surgeon: Leandrew Koyanagi, MD;  Location: [place];  Service: Ophthalmology;  Laterality: Right;   HAND SURGERY Right    TONSILLECTOMY     Patient Active Problem List   Diagnosis Date Noted   Hepatitis C 08/19/2016   Depression 08/19/2016   Insomnia 08/19/2016   Hypothyroidism 08/19/2016   Family History  Problem Relation Age of Onset   Dementia Mother    Breast cancer Sister 92   Neuropathy Neg Hx    Tremor Neg Hx    Social History   Tobacco Use   Smoking status: Former    Packs/day: 1.00    Years: 10.00    Additional pack years: 0.00    Total pack years: 10.00    Types: Cigarettes    Quit date: 75    Years since quitting: 41.2   Smokeless  tobacco: Never  Substance Use Topics   Alcohol use: No   Allergies  Allergen Reactions   Sonata [[place]]     Took with another medication with this and it caused hallucinations.   Carbidopa-Levodopa Nausea Only   Current Meds  Medication Sig   amitriptyline (ELAVIL) 50 MG tablet Take 150 mg by mouth at bedtime as needed for sleep.    ARIPiprazole (ABILIFY) 5 MG tablet Take 5 mg by mouth daily.   aspirin EC 81 MG tablet Take 81 mg by mouth daily.   buPROPion (WELLBUTRIN SR) 150 MG 12 hr tablet Take 150 mg by mouth 3 (three) times daily.    calcium carbonate (OS-CAL) 600 MG TABS tablet Take 600 mg by mouth 2 (two) times daily with a meal.   Cholecalciferol 100 MCG (4000 UT) CAPS Take 4,000 Units by mouth  daily.   levothyroxine (SYNTHROID) 112 MCG tablet Take 112 mcg by mouth daily before breakfast.   Multiple Vitamins-Minerals (MULTIVITAL-M) TABS Take 1 tablet by mouth daily.    mupirocin ointment (BACTROBAN) 2 % Apply 1 Application topically daily. Qd to wound on left lower leg until resolved   omeprazole (PRILOSEC) 20 MG capsule Take 20 mg by mouth daily.   Venlafaxine HCl 150 MG TB24 Take 2 tablets by mouth daily.   Immunization History  Administered Date(s) Administered   Influenza-Unspecified 05/15/2022        Objective:   Physical Exam BP 120/78 (BP Location: Left Arm, Cuff Size: Normal)   Pulse 95   Temp 97.7 F (36.5 C)   Ht 5\' 5"  (1.651 m)   Wt 177 lb (80.3 kg)   SpO2 97%   BMI 29.45 kg/m   SpO2: 97 % O2 Device: None (Room air)  GENERAL: Well-developed, overweight woman, no acute distress.  Fully ambulatory.  Stational dyspnea. HEAD: Normocephalic, atraumatic.  EYES: Pupils equal, round, reactive to light.  No scleral icterus.  MOUTH: Poor dentition, mouth malocclusion. NECK: Supple. No thyromegaly. Trachea midline. No JVD.  No adenopathy. PULMONARY: Good air entry bilaterally.  Coarse, otherwise no adventitious sounds. CARDIOVASCULAR: S1 and S2. Regular rate and rhythm.  No rubs, murmurs or gallops heard. ABDOMEN: Benign. MUSCULOSKELETAL: No joint deformity, no clubbing, no edema.  NEUROLOGIC: No overt focal deficit, no gait disturbance, speech is fluent. SKIN: Intact,warm,dry.  Multiple actinic keratoses. PSYCH: Mood and behavior normal.  Chest x-ray performed 20 October 2022 showing nodularity in the right lung apex.  May represent infection:     Assessment & Plan:     ICD-10-CM   1. Abnormal chest x-ray  R93.89 DG Chest 2 View   The abnormality appears to be a nodular type infiltrate Patient has had purulent sputum over the last 2 days Suspect pneumonia    2. Community acquired pneumonia of right upper lobe of lung  J18.9 DG Chest 2 View   Will treat  with cefpodoxime and azithromycin  Repeat this x-ray after treatment     Orders Placed This Encounter  Procedures   DG Chest 2 View    To be done before follow up appt.    Standing Status:   Future    Standing Expiration Date:   11/07/2023    Order Specific Question:   Reason for Exam (SYMPTOM  OR DIAGNOSIS REQUIRED)    Answer:   pneumonia    Order Specific Question:   Preferred imaging location?    Answer:   [facility] ordered this encounter  Medications   azithromycin (  ZITHROMAX) 250 MG tablet    Sig: Take 2 tablets (500 mg) on  Day 1,  followed by 1 tablet (250 mg) once daily on Days 2 through 5.    Dispense:  6 each    Refill:  0   cefpodoxime (VANTIN) 200 MG tablet    Sig: Take 1 tablet (200 mg total) by mouth 2 (two) times daily for 5 days.    Dispense:  10 tablet    Refill:  0   The patient in follow-up in 6 weeks time, we will get a chest x-ray on return appointment.  In the interim she is to call us prior to return appointment if her symptoms worsen or fail to improve.  Renold Don, MD Advanced Bronchoscopy PCCM [facility]    *This note was dictated using voice recognition software/Dragon.  Despite best efforts to proofread, errors can occur which can change the meaning. Any transcriptional errors that result from this process are unintentional and may not be fully corrected at the time of dictation.

## 2022-11-08 ENCOUNTER — Ambulatory Visit: Payer: 59 | Admitting: Physical Therapy

## 2022-11-10 ENCOUNTER — Ambulatory Visit: Payer: 59 | Admitting: Physical Therapy

## 2022-11-14 ENCOUNTER — Telehealth: Payer: Self-pay | Admitting: Pulmonary Disease

## 2022-11-14 MED ORDER — METHYLPREDNISOLONE 4 MG PO TBPK: ORAL_TABLET | ORAL | 0 refills | Status: DC

## 2022-11-14 NOTE — Telephone Encounter (Signed)
Per Dr. Patsey Berthold, send in Howell to her pharmacy.  I have sent in the prescription and notified the patient.

## 2022-11-14 NOTE — Telephone Encounter (Signed)
The patient stopped by the office.   She is still the same after taking the antibiotics Coughing and wheezing are the same. Not any worse. SOB with exertion only- has not increased. No F/C/S She has finished both antibiotics.

## 2022-11-14 NOTE — Telephone Encounter (Signed)
ATC patient. LVM for patient to return my call. 

## 2022-11-14 NOTE — Telephone Encounter (Signed)
Returning call.

## 2022-11-14 NOTE — Telephone Encounter (Signed)
Agree, as discussed.

## 2022-11-14 NOTE — Telephone Encounter (Signed)
Pt states still has cough and wheezing and has completed all the antibiotics

## 2022-11-14 NOTE — Telephone Encounter (Signed)
ATC the patient again. LVM for patient to return my call.

## 2022-11-15 ENCOUNTER — Ambulatory Visit: Payer: 59 | Admitting: Physical Therapy

## 2022-11-15 NOTE — Therapy (Deleted)
OUTPATIENT PHYSICAL THERAPY TREATMENT   Patient Name: [name] [name] MRN: [id number] DOB:May 19, 1956, 67 y.o., female Today's Date: 11/15/2022   PCP: Waylan Rocher, MD  REFERRING PROVIDER: Vladimir Crofts, MD   END OF SESSION:         Past Medical History:  Diagnosis Date   Actinic keratosis    Acute hepatitis C without mention of hepatic coma(070.51)    Arthritis    fingers   Carpal tunnel syndrome of left wrist    Depression    GERD (gastroesophageal reflux disease)    Hypothyroidism    Insomnia    Insomnia    Memory loss    Osteoporosis    Squamous cell carcinoma of skin 12/27/2021   Left pretibial - [facility]   Tremor    Wears dentures    full upper, partial lower   Past Surgical History:  Procedure Laterality Date   ANKLE SURGERY Right    BLADDER TACT     CATARACT EXTRACTION W/PHACO Left 10/13/2021   Procedure: CATARACT EXTRACTION PHACO AND INTRAOCULAR LENS PLACEMENT ([place]) LEFT;  Surgeon: Leandrew Koyanagi, MD;  Location: [place];  Service: Ophthalmology;  Laterality: Left;  3.97 00:49.0   CATARACT EXTRACTION W/PHACO Right 10/27/2021   Procedure: CATARACT EXTRACTION PHACO AND INTRAOCULAR LENS PLACEMENT ([place]) RIGHT 4.50 00:58.9;  Surgeon: Leandrew Koyanagi, MD;  Location: [facility] [facility];  Service: Ophthalmology;  Laterality: Right;   HAND SURGERY Right    TONSILLECTOMY     Patient Active Problem List   Diagnosis Date Noted   Hepatitis C 08/19/2016   Depression 08/19/2016   Insomnia 08/19/2016   Hypothyroidism 08/19/2016    ONSET DATE: 04/14/22  REFERRING DIAG: R26.9 (ICD-10-CM) - Abnormality of gait and mobility   THERAPY DIAG:  No diagnosis found.  Rationale for Evaluation and Treatment: Rehabilitation  SUBJECTIVE:                                                                                                                                                                                             SUBJECTIVE  STATEMENT: Pt reports she had a fall last Wednesday resulting in rib fracture. It was limiting for a few days but has improved some and only bothers with certain movement sand when walking up in the AM.   PERTINENT HISTORY: Changes since last time: Pt reports since last time she was seen here for therapy she has lost her balance quite a few times. Pt reports she loses her balance about once week. 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 Pt  reports her last fall pt was at home and lost her balance and fell backwards. Prior to that she fell outside when there was soft and uneven ground she was not able to accommodate for. Pt reports she lost the exercises she had been discharged with from therapy. In addition pt is having new onset of right knee pain and feels she may need to get it replaced as she got other knee replaced in the past. Pt is familiar to this clinic and has been treated here for similar problems in the past.  PAIN:  Are you having pain?  Yes, 2 out of 10 pain in knees bilaterally with increased pain noted on the right side compared to the left  PRECAUTIONS: Fall WEIGHT BEARING RESTRICTIONS: No FALLS: Has patient fallen in last 6 months? Yes. Number of falls 10-12 (per patient)  PATIENT GOALS: Get up from floor better, get up from chairs better, improve balance, decrease falls.   OBJECTIVE:    TODAY'S TREATMENT:                                                                                                                              DATE: 11/15/22  TE- 5# AW donned Seated LAQ x 10 reps ea LE  Standing knee flexion x 10 ea LE with 5# AW  Hip abduciton in standing with 5# AW x 10 reps ea side  Marching seated with 5 pound ankle weights x 10 reps on each side  NMR  Dynamic Balance ambulation training - walking x 2 rounds each for 30-40 feet - retro walking, eyes closed walking, and tandem walking x 3 rounds each  with rest between reps   -BOSU balance eyes closed 3 x 60 sec with normal stance Good control but requires UE assist throughout for balance         PATIENT EDUCATION: Education details: Importance of monitoring knee pain and following up with proper channels for increased pain including her positions. Person educated: Patient Education method: Explanation Education comprehension: verbalized understanding  HOME EXERCISE PROGRAM:  Access Code: [email or web address] URL: [email or web address] Date: 10/06/2022 Prepared by: Rivka Barbara Initiated and instructed in the following HEP   Exercises - Standing March with Counter Support  - 1 x daily - 7 x weekly - 2 sets - 10 reps - 2 sec  hold - standing in split stance with vertical head nods   - 1 x daily - 7 x weekly - 2 sets - 15 reps - standing in split stance with vertical head nods   - 1 x daily - 7 x weekly - 2 sets - 15 reps   Access Code: [email or web address] URL: [email or web address] Date: 10/06/2022 Prepared by: Rivka Barbara  Exercises - Standing March with Counter Support  - 1 x daily - 7 x weekly - 2 sets - 10 reps - 2 sec  hold - standing in split stance with vertical head nods   - 1 x daily -  7 x weekly - 2 sets - 15 reps - standing in split stance with vertical head nods   - 1 x daily - 7 x weekly - 2 sets - 15 reps  GOALS: Goals reviewed with patient? Yes  SHORT TERM GOALS: Target date: 10/10/2022   Patient will be independent in home exercise program to improve strength/mobility for better functional independence with ADLs. Baseline: No HEP currently, lost HEP from last therapy  Goal status: INITIAL   LONG TERM GOALS: Target date: 11/10/2022   1.  Patient (> 22 years old) will complete five times sit to stand test in < 15 seconds indicating an increased LE strength and improved balance. Baseline: 17.3 sec Goal status: INITIAL  2.  Patient will increase FOTO score to equal to or greater than   65   to demonstrate statistically significant improvement in mobility and quality of life.  Baseline: 59 Goal status: INITIAL   3.  Patient will increase DGI Balance score by > 4 points to demonstrate decreased fall risk during functional activities. Baseline: 19 Goal status: INITIAL  4.  Patient will increase six minute walk test distance to >1300 for progression to community ambulator and improve gait ability Baseline: 1180; 10/11/22: 1341ft no device, no LOB Goal status: ACHIEVED     ASSESSMENT: CLINICAL IMPRESSION:  Patient presents with good motivation for completion of physical therapy activities.  Continued with current plan of care as laid out in evaluation and recent prior sessions. Pt remains motivated to advance progress toward goals in order to maximize independence and safety at home. Pt requires high level assistance and cuing for completion of exercises in order to provide adequate level of stimulation and perturbation. Author allows pt as much opportunity as possible to perform independent righting strategies, only stepping in when pt is unable to prevent falling to floor. Pt progresses with balance interventions with dynamic walking, has difficulty with tandem and eyes closed gait more than others and had consistent list to the right with EC ambulation.  Pt will continue to benefit from skilled physical therapy intervention to address impairments, improve QOL, and attain therapy goals.      OBJECTIVE IMPAIRMENTS: Abnormal gait, decreased activity tolerance, decreased balance, decreased endurance, decreased mobility, and difficulty walking.   ACTIVITY LIMITATIONS: standing, squatting, stairs, and locomotion level PARTICIPATION LIMITATIONS: community activity and yard work PERSONAL FACTORS: Age and Past/current experiences are also affecting patient's functional outcome.  REHAB POTENTIAL: Good CLINICAL DECISION MAKING: Stable/uncomplicated EVALUATION COMPLEXITY:  Low  PLAN:  PT FREQUENCY: 1-2x/week PT DURATION: 8 weeks PLANNED INTERVENTIONS: Therapeutic exercises, Therapeutic activity, Neuromuscular re-education, Balance training, Gait training, Patient/Family education, Self Care, Joint mobilization, Stair training, Vestibular training, Dry Needling, Moist heat, and Manual therapy  PLAN FOR NEXT SESSION: Balance HEP, endurance as indicated, high level dynamic balance interventions     Particia Lather, PT 11/15/2022, 7:55 AM  7:55 AM, 11/15/22 Particia Lather PT ,DPT Physical Therapist- [facility]

## 2022-11-17 ENCOUNTER — Ambulatory Visit: Payer: 59 | Admitting: Physical Therapy

## 2022-11-21 NOTE — Therapy (Deleted)
OUTPATIENT PHYSICAL THERAPY TREATMENT/ Physical Therapy Progress Note   Dates of reporting period  ***   to   ***    Patient Name: [name] [name] MRN: [id number] DOB:01-01-1956, 67 y.o., female Today's Date: 11/21/2022   PCP: Louis Matte, MD  REFERRING PROVIDER: Lonell Face, MD   END OF SESSION:         Past Medical History:  Diagnosis Date   Actinic keratosis    Acute hepatitis C without mention of hepatic coma(070.51)    Arthritis    fingers   Carpal tunnel syndrome of left wrist    Depression    GERD (gastroesophageal reflux disease)    Hypothyroidism    Insomnia    Insomnia    Memory loss    Osteoporosis    Squamous cell carcinoma of skin 12/27/2021   Left pretibial - [facility]   Tremor    Wears dentures    full upper, partial lower   Past Surgical History:  Procedure Laterality Date   ANKLE SURGERY Right    BLADDER TACT     CATARACT EXTRACTION W/PHACO Left 10/13/2021   Procedure: CATARACT EXTRACTION PHACO AND INTRAOCULAR LENS PLACEMENT ([facility]) LEFT;  Surgeon: Lockie Mola, MD;  Location[facility];  Service: Ophthalmology;  Laterality: Left;  3.97 00:49.0   CATARACT EXTRACTION W/PHACO Right 10/27/2021   Procedure: CATARACT EXTRACTION PHACO AND INTRAOCULAR LENS PLACEMENT ([facility]) RIGHT 4.50 00:58.9;  Surgeon: Lockie Mola, MD;  Location: [facility];  Service: Ophthalmology;  Laterality: Right;   HAND SURGERY Right    TONSILLECTOMY     Patient Active Problem List   Diagnosis Date Noted   Hepatitis C 08/19/2016   Depression 08/19/2016   Insomnia 08/19/2016   Hypothyroidism 08/19/2016    ONSET DATE: 04/14/22  REFERRING DIAG: R26.9 (ICD-10-CM) - Abnormality of gait and mobility   THERAPY DIAG:  Unsteadiness on feet  Other abnormalities of gait and mobility  Muscle weakness (generalized)  Other lack of coordination  Rationale for Evaluation and Treatment: Rehabilitation  SUBJECTIVE:                                                                                                                                                                                              SUBJECTIVE STATEMENT: Pt reports she had a fall last Wednesday resulting in rib fracture. It was limiting for a few days but has improved some and only bothers with certain movement sand when walking up in the AM.   PERTINENT HISTORY: Changes since last time: Pt reports since last time she was seen here for therapy she has lost her  balance quite a few times. Pt reports she loses her balance about once week. 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 Pt reports her last fall pt was at home and lost her balance and fell backwards. Prior to that she fell outside when there was soft and uneven ground she was not able to accommodate for. Pt reports she lost the exercises she had been discharged with from therapy. In addition pt is having new onset of right knee pain and feels she may need to get it replaced as she got other knee replaced in the past. Pt is familiar to this clinic and has been treated here for similar problems in the past.  PAIN:  Are you having pain?  Yes, 2 out of 10 pain in knees bilaterally with increased pain noted on the right side compared to the left  PRECAUTIONS: Fall WEIGHT BEARING RESTRICTIONS: No FALLS: Has patient fallen in last 6 months? Yes. Number of falls 10-12 (per patient)  PATIENT GOALS: Get up from floor better, get up from chairs better, improve balance, decrease falls.   OBJECTIVE:    TODAY'S TREATMENT:                                                                                                                              DATE: 11/21/22  TE- 5# AW donned Seated LAQ x 10 reps ea LE  Standing knee flexion x 10 ea LE with 5# AW  Hip abduciton in standing with 5# AW x 10 reps ea side  Marching seated with 5 pound ankle weights x 10  reps on each side  NMR  Dynamic Balance ambulation training - walking x 2 rounds each for 30-40 feet - retro walking, eyes closed walking, and tandem walking x 3 rounds each with rest between reps   -BOSU balance eyes closed 3 x 60 sec with normal stance Good control but requires UE assist throughout for balance         PATIENT EDUCATION: Education details: Importance of monitoring knee pain and following up with proper channels for increased pain including her positions. Person educated: Patient Education method: Explanation Education comprehension: verbalized understanding  HOME EXERCISE PROGRAM:  Access Code: [email or web address] URL: [email or web address] Date: 10/06/2022 Prepared by: Thresa Ross Initiated and instructed in the following HEP   Exercises - Standing March with Counter Support  - 1 x daily - 7 x weekly - 2 sets - 10 reps - 2 sec  hold - standing in split stance with vertical head nods   - 1 x daily - 7 x weekly - 2 sets - 15 reps - standing in split stance with vertical head nods   - 1 x daily - 7 x weekly - 2 sets - 15 reps   Access Code: [email or web address] URL: [email or web address] Date: 10/06/2022 Prepared by: Thresa Ross  Exercises - Standing March with Counter Support  -  1 x daily - 7 x weekly - 2 sets - 10 reps - 2 sec  hold - standing in split stance with vertical head nods   - 1 x daily - 7 x weekly - 2 sets - 15 reps - standing in split stance with vertical head nods   - 1 x daily - 7 x weekly - 2 sets - 15 reps  GOALS: Goals reviewed with patient? Yes  SHORT TERM GOALS: Target date: 10/10/2022   Patient will be independent in home exercise program to improve strength/mobility for better functional independence with ADLs. Baseline: No HEP currently, lost HEP from last therapy  Goal status: INITIAL   LONG TERM GOALS: Target date: 11/10/2022   1.  Patient (> 68 years old) will complete five times sit to stand test in <  15 seconds indicating an increased LE strength and improved balance. Baseline: 17.3 sec Goal status: INITIAL  2.  Patient will increase FOTO score to equal to or greater than  65   to demonstrate statistically significant improvement in mobility and quality of life.  Baseline: 59 Goal status: INITIAL   3.  Patient will increase DGI Balance score by > 4 points to demonstrate decreased fall risk during functional activities. Baseline: 19 Goal status: INITIAL  4.  Patient will increase six minute walk test distance to >1300 for progression to community ambulator and improve gait ability Baseline: 1180; 10/11/22: 132250ft no device, no LOB Goal status: ACHIEVED     ASSESSMENT: CLINICAL IMPRESSION:  Patient presents with good motivation for completion of physical therapy activities.  Continued with current plan of care as laid out in evaluation and recent prior sessions. Pt remains motivated to advance progress toward goals in order to maximize independence and safety at home. Pt requires high level assistance and cuing for completion of exercises in order to provide adequate level of stimulation and perturbation. Author allows pt as much opportunity as possible to perform independent righting strategies, only stepping in when pt is unable to prevent falling to floor. Pt progresses with balance interventions with dynamic walking, has difficulty with tandem and eyes closed gait more than others and had consistent list to the right with EC ambulation.  Pt will continue to benefit from skilled physical therapy intervention to address impairments, improve QOL, and attain therapy goals.      OBJECTIVE IMPAIRMENTS: Abnormal gait, decreased activity tolerance, decreased balance, decreased endurance, decreased mobility, and difficulty walking.   ACTIVITY LIMITATIONS: standing, squatting, stairs, and locomotion level PARTICIPATION LIMITATIONS: community activity and yard work PERSONAL FACTORS: Age and  Past/current experiences are also affecting patient's functional outcome.  REHAB POTENTIAL: Good CLINICAL DECISION MAKING: Stable/uncomplicated EVALUATION COMPLEXITY: Low  PLAN:  PT FREQUENCY: 1-2x/week PT DURATION: 8 weeks PLANNED INTERVENTIONS: Therapeutic exercises, Therapeutic activity, Neuromuscular re-education, Balance training, Gait training, Patient/Family education, Self Care, Joint mobilization, Stair training, Vestibular training, Dry Needling, Moist heat, and Manual therapy  PLAN FOR NEXT SESSION: Balance HEP, endurance as indicated, high level dynamic balance interventions     Norman HerrlichChristopher B Rhea Thrun, PT 11/21/2022, 9:46 AM  9:46 AM, 11/21/22 Norman Herrlichhristopher B Jacoya Bauman PT ,DPT Physical Therapist-   [facility]

## 2022-11-22 ENCOUNTER — Telehealth: Payer: Self-pay | Admitting: Pulmonary Disease

## 2022-11-22 ENCOUNTER — Ambulatory Visit: Payer: 59 | Attending: Neurology | Admitting: Physical Therapy

## 2022-11-22 DIAGNOSIS — M6281 Muscle weakness (generalized): Secondary | ICD-10-CM | POA: Insufficient documentation

## 2022-11-22 DIAGNOSIS — R2681 Unsteadiness on feet: Secondary | ICD-10-CM | POA: Insufficient documentation

## 2022-11-22 DIAGNOSIS — R278 Other lack of coordination: Secondary | ICD-10-CM | POA: Insufficient documentation

## 2022-11-22 DIAGNOSIS — R2689 Other abnormalities of gait and mobility: Secondary | ICD-10-CM | POA: Insufficient documentation

## 2022-11-22 MED ORDER — BUDESONIDE-FORMOTEROL FUMARATE 160-4.5 MCG/ACT IN AERO: 2.0000 | INHALATION_SPRAY | Freq: Two times a day (BID) | RESPIRATORY_TRACT | 2 refills | Status: DC

## 2022-11-22 NOTE — Telephone Encounter (Signed)
I have notified the patient and sent the medication to [facility].  Nothing further needed.

## 2022-11-22 NOTE — Telephone Encounter (Signed)
Patient would like the nurse to call her because she has pneumonia and would like to know what she should do.  Please advise and call patient to discuss at [phone]

## 2022-11-22 NOTE — Telephone Encounter (Signed)
She has an x-ray order for before she comes to see me but she can go at any time and get it done.  Given that she had some response with the steroid this is likely due to inflammation and not due to infection.  We could try a Symbicort inhaler 160/4.5, 2 inhalations twice a day to see if this helps with her symptoms.

## 2022-11-22 NOTE — Telephone Encounter (Signed)
I spoke with the patient. She said her cough was some better after taking the last Prednisone taper (4/1) but now it is back.  Dry cough. No F/C/S Some wheezing. No SOB Does not check her O2 at home Thinks she may need another chest xray.

## 2022-11-24 ENCOUNTER — Ambulatory Visit: Payer: 59 | Admitting: Physical Therapy

## 2022-11-25 ENCOUNTER — Ambulatory Visit
Admission: RE | Admit: 2022-11-25 | Discharge: 2022-11-25 | Disposition: A | Discharge status: Home / Self Care | Payer: 59 | Source: Ambulatory Visit | Attending: Pulmonary Disease | Admitting: Pulmonary Disease

## 2022-11-25 DIAGNOSIS — J189 Pneumonia, unspecified organism: Secondary | ICD-10-CM | POA: Insufficient documentation

## 2022-11-25 DIAGNOSIS — R9389 Abnormal findings on diagnostic imaging of other specified body structures: Secondary | ICD-10-CM | POA: Diagnosis present

## 2022-11-28 ENCOUNTER — Telehealth: Payer: Self-pay

## 2022-11-28 NOTE — Telephone Encounter (Signed)
The patient called back saying if you think she needs another antibiotic please sent it into [facility] on [address].

## 2022-11-28 NOTE — Telephone Encounter (Signed)
ATC the patient. LVM for patient to return my call. 

## 2022-11-28 NOTE — Telephone Encounter (Signed)
Bronchitis does not always show up on CXR.

## 2022-11-28 NOTE — Telephone Encounter (Signed)
I notified the patient of her results.   She is asking why she is still having the cough with green sputum, if the chest xray is normal?

## 2022-11-28 NOTE — Telephone Encounter (Signed)
Her initial chest x-ray showed a patch of pneumonia.  She received 2 strong antibiotics and completed that.  Her follow-up chest x-ray shows that this issue cleared.  She probably still has some inflammation left over.  This will take some time to resolve.  She needs to continue using the inhaler that was prescribed for her.  I recommend she takes Zyrtec over-the-counter at bedtime 1 tablet daily.  I do not think she needs more antibiotics at this point as her chest x-ray showed clearing of the previous process.  Times the cough can persist up to 12 weeks after an acute infection.  This is called postinfectious cough.

## 2022-11-28 NOTE — Telephone Encounter (Signed)
I notified the patient.  She said she is still having the Cough with green sputum and wheezing. No SOB She feels like her allergies are bothering her as well. She has finished the Prednisone and Antibiotics. Not taking any allergy medications.  What can she do for the Bronchitis/Allergies?

## 2022-11-28 NOTE — Telephone Encounter (Signed)
I notified the patient. She did mention that she called in over the weekend stating she had misplaced her Symbicort inhaler. She asked for a refill on the inhaler.  I told her the original prescription has 2 refills on it. I told her to contact her pharmacy for a refill.  Nothing further needed.

## 2022-11-28 NOTE — Telephone Encounter (Signed)
-----   Message from Salena Saner, MD sent at 11/28/2022  8:24 AM EDT ----- Chest x-ray did not show any significant findings.  No pneumonia, no other abnormalities.

## 2022-11-29 ENCOUNTER — Ambulatory Visit: Payer: 59 | Admitting: Physical Therapy

## 2022-11-29 NOTE — Therapy (Deleted)
OUTPATIENT PHYSICAL THERAPY TREATMENT/ Physical Therapy Progress Note   Dates of reporting period  ***   to   ***    Patient Name: [name] [name] MRN: [id number] DOB:February 02, 1956, 67 y.o., female Today's Date: 11/29/2022   PCP: Louis Matte, MD  REFERRING PROVIDER: Lonell Face, MD   END OF SESSION:         Past Medical History:  Diagnosis Date   Actinic keratosis    Acute hepatitis C without mention of hepatic coma(070.51)    Arthritis    fingers   Carpal tunnel syndrome of left wrist    Depression    GERD (gastroesophageal reflux disease)    Hypothyroidism    Insomnia    Insomnia    Memory loss    Osteoporosis    Squamous cell carcinoma of skin 12/27/2021   Left pretibial - [facility]   Tremor    Wears dentures    full upper, partial lower   Past Surgical History:  Procedure Laterality Date   ANKLE SURGERY Right    BLADDER TACT     CATARACT EXTRACTION W/PHACO Left 10/13/2021   Procedure: CATARACT EXTRACTION PHACO AND INTRAOCULAR LENS PLACEMENT (IOC) LEFT;  Surgeon: Lockie Mola, MD;  Location: [facility];  Service: Ophthalmology;  Laterality: Left;  3.97 00:49.0   CATARACT EXTRACTION W/PHACO Right 10/27/2021   Procedure: CATARACT EXTRACTION PHACO AND INTRAOCULAR LENS PLACEMENT (IOC) RIGHT 4.50 00:58.9;  Surgeon: Lockie Mola, MD;  Location: [facility];  Service: Ophthalmology;  Laterality: Right;   HAND SURGERY Right    TONSILLECTOMY     Patient Active Problem List   Diagnosis Date Noted   Hepatitis C 08/19/2016   Depression 08/19/2016   Insomnia 08/19/2016   Hypothyroidism 08/19/2016    ONSET DATE: 04/14/22  REFERRING DIAG: R26.9 (ICD-10-CM) - Abnormality of gait and mobility   THERAPY DIAG:  Unsteadiness on feet  Other abnormalities of gait and mobility  Muscle weakness (generalized)  Rationale for Evaluation and Treatment: Rehabilitation  SUBJECTIVE:                                                                                                                                                                                              SUBJECTIVE STATEMENT: Pt reports she had a fall last Wednesday resulting in rib fracture. It was limiting for a few days but has improved some and only bothers with certain movement sand when walking up in the AM.   PERTINENT HISTORY: Changes since last time: Pt reports since last time she was seen here for therapy she has lost her balance quite a few times.  Pt reports she loses her balance about once week. 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 Pt reports her last fall pt was at home and lost her balance and fell backwards. Prior to that she fell outside when there was soft and uneven ground she was not able to accommodate for. Pt reports she lost the exercises she had been discharged with from therapy. In addition pt is having new onset of right knee pain and feels she may need to get it replaced as she got other knee replaced in the past. Pt is familiar to this clinic and has been treated here for similar problems in the past.  PAIN:  Are you having pain?  Yes, 2 out of 10 pain in knees bilaterally with increased pain noted on the right side compared to the left  PRECAUTIONS: Fall WEIGHT BEARING RESTRICTIONS: No FALLS: Has patient fallen in last 6 months? Yes. Number of falls 10-12 (per patient)  PATIENT GOALS: Get up from floor better, get up from chairs better, improve balance, decrease falls.   OBJECTIVE:    TODAY'S TREATMENT:                                                                                                                              DATE: 11/29/22  TE- 5# AW donned Seated LAQ x 10 reps ea LE  Standing knee flexion x 10 ea LE with 5# AW  Hip abduciton in standing with 5# AW x 10 reps ea side  Marching seated with 5 pound ankle weights x 10 reps on each  side  NMR  Dynamic Balance ambulation training - walking x 2 rounds each for 30-40 feet - retro walking, eyes closed walking, and tandem walking x 3 rounds each with rest between reps   -BOSU balance eyes closed 3 x 60 sec with normal stance Good control but requires UE assist throughout for balance         PATIENT EDUCATION: Education details: Importance of monitoring knee pain and following up with proper channels for increased pain including her positions. Person educated: Patient Education method: Explanation Education comprehension: verbalized understanding  HOME EXERCISE PROGRAM:  Access Code: [email or web address] URL: [email or web address] Date: 10/06/2022 Prepared by: Thresa Ross Initiated and instructed in the following HEP   Exercises - Standing March with Counter Support  - 1 x daily - 7 x weekly - 2 sets - 10 reps - 2 sec  hold - standing in split stance with vertical head nods   - 1 x daily - 7 x weekly - 2 sets - 15 reps - standing in split stance with vertical head nods   - 1 x daily - 7 x weekly - 2 sets - 15 reps   Access Code: [email or web address] URL: [email or web address] Date: 10/06/2022 Prepared by: Thresa Ross  Exercises - Standing March with Counter Support  - 1 x daily -  7 x weekly - 2 sets - 10 reps - 2 sec  hold - standing in split stance with vertical head nods   - 1 x daily - 7 x weekly - 2 sets - 15 reps - standing in split stance with vertical head nods   - 1 x daily - 7 x weekly - 2 sets - 15 reps  GOALS: Goals reviewed with patient? Yes  SHORT TERM GOALS: Target date: 10/10/2022   Patient will be independent in home exercise program to improve strength/mobility for better functional independence with ADLs. Baseline: No HEP currently, lost HEP from last therapy  Goal status: INITIAL   LONG TERM GOALS: Target date: 11/10/2022   1.  Patient (> 35 years old) will complete five times sit to stand test in < 15 seconds  indicating an increased LE strength and improved balance. Baseline: 17.3 sec Goal status: INITIAL  2.  Patient will increase FOTO score to equal to or greater than  65   to demonstrate statistically significant improvement in mobility and quality of life.  Baseline: 59 Goal status: INITIAL   3.  Patient will increase DGI Balance score by > 4 points to demonstrate decreased fall risk during functional activities. Baseline: 19 Goal status: INITIAL  4.  Patient will increase six minute walk test distance to >1300 for progression to community ambulator and improve gait ability Baseline: 1180; 10/11/22: 1342ft no device, no LOB Goal status: ACHIEVED     ASSESSMENT: CLINICAL IMPRESSION:  Pt presents to PT for progress note. It is important to note that pt has been over a month since her last treatment as pt has been recovering from pneumonia and her progress may have been slowed or regressed as a result.      OBJECTIVE IMPAIRMENTS: Abnormal gait, decreased activity tolerance, decreased balance, decreased endurance, decreased mobility, and difficulty walking.   ACTIVITY LIMITATIONS: standing, squatting, stairs, and locomotion level PARTICIPATION LIMITATIONS: community activity and yard work PERSONAL FACTORS: Age and Past/current experiences are also affecting patient's functional outcome.  REHAB POTENTIAL: Good CLINICAL DECISION MAKING: Stable/uncomplicated EVALUATION COMPLEXITY: Low  PLAN:  PT FREQUENCY: 1-2x/week PT DURATION: 8 weeks PLANNED INTERVENTIONS: Therapeutic exercises, Therapeutic activity, Neuromuscular re-education, Balance training, Gait training, Patient/Family education, Self Care, Joint mobilization, Stair training, Vestibular training, Dry Needling, Moist heat, and Manual therapy  PLAN FOR NEXT SESSION: Balance HEP, endurance as indicated, high level dynamic balance interventions     Norman Herrlich, PT 11/29/2022, 7:59 AM  7:59 AM, 11/29/22 Norman Herrlich PT ,DPT Physical Therapist- [place]  [facility]

## 2022-12-01 ENCOUNTER — Ambulatory Visit: Payer: 59 | Admitting: Physical Therapy

## 2022-12-05 ENCOUNTER — Ambulatory Visit: Payer: 59 | Admitting: Physical Therapy

## 2022-12-05 ENCOUNTER — Encounter: Payer: Self-pay | Admitting: Physical Therapy

## 2022-12-05 DIAGNOSIS — M6281 Muscle weakness (generalized): Secondary | ICD-10-CM | POA: Diagnosis present

## 2022-12-05 DIAGNOSIS — R2681 Unsteadiness on feet: Secondary | ICD-10-CM | POA: Diagnosis not present

## 2022-12-05 DIAGNOSIS — R278 Other lack of coordination: Secondary | ICD-10-CM | POA: Diagnosis present

## 2022-12-05 DIAGNOSIS — R2689 Other abnormalities of gait and mobility: Secondary | ICD-10-CM | POA: Diagnosis present

## 2022-12-05 NOTE — Therapy (Signed)
OUTPATIENT PHYSICAL THERAPY TREATMENT/ Physical Therapy Progress Note/ RECERT    Dates of reporting period  09/15/22   to   12/05/22    Patient Name: [name] [name] MRN: [id number] DOB:February 08, 1956, 67 y.o., female Today's Date: 12/05/2022   PCP: Louis Matte, MD  REFERRING PROVIDER: Lonell Face, MD   END OF SESSION:  PT End of Session - 12/05/22 1520     Visit Number 10    Number of Visits 24    Date for PT Re-Evaluation 01/30/23    Authorization Type UHC; Medicaid of [place]    Progress Note Due on Visit 10    PT Start Time 1516    PT Stop Time 1559    PT Time Calculation (min) 43 min    Equipment Utilized During Treatment Gait belt    Activity Tolerance Patient tolerated treatment well;No increased pain    Behavior During Therapy WFL for tasks assessed/performed                   Past Medical History:  Diagnosis Date   Actinic keratosis    Acute hepatitis C without mention of hepatic coma(070.51)    Arthritis    fingers   Carpal tunnel syndrome of left wrist    Depression    GERD (gastroesophageal reflux disease)    Hypothyroidism    Insomnia    Insomnia    Memory loss    Osteoporosis    Squamous cell carcinoma of skin 12/27/2021   Left pretibial - [facility]   Tremor    Wears dentures    full upper, partial lower   Past Surgical History:  Procedure Laterality Date   ANKLE SURGERY Right    BLADDER TACT     CATARACT EXTRACTION W/PHACO Left 10/13/2021   Procedure: CATARACT EXTRACTION PHACO AND INTRAOCULAR LENS PLACEMENT (IOC) LEFT;  Surgeon: Lockie Mola, MD;  Location: [facility];  Service: Ophthalmology;  Laterality: Left;  3.97 00:49.0   CATARACT EXTRACTION W/PHACO Right 10/27/2021   Procedure: CATARACT EXTRACTION PHACO AND INTRAOCULAR LENS PLACEMENT (IOC) RIGHT 4.50 00:58.9;  Surgeon: Lockie Mola, MD;  Location: [facility];  Service: Ophthalmology;  Laterality: Right;   HAND SURGERY Right    TONSILLECTOMY      Patient Active Problem List   Diagnosis Date Noted   Hepatitis C 08/19/2016   Depression 08/19/2016   Insomnia 08/19/2016   Hypothyroidism 08/19/2016    ONSET DATE: 04/14/22  REFERRING DIAG: R26.9 (ICD-10-CM) - Abnormality of gait and mobility   THERAPY DIAG:  Unsteadiness on feet  Other abnormalities of gait and mobility  Muscle weakness (generalized)  Rationale for Evaluation and Treatment: Rehabilitation  SUBJECTIVE:  SUBJECTIVE STATEMENT: Pt reports she has been recovering form bronchitis and pneumonia. Pt reports balancing on a step stool or ladder is difficult, carrying objects like boxes. Poorly lit areas, uneven terrain continue to be challenging for her and cause her to lose her balance.   PERTINENT HISTORY: Changes since last time: Pt reports since last time she was seen here for therapy she has lost her balance quite a few times. Pt reports she loses her balance about once week. 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 Pt reports her last fall pt was at home and lost her balance and fell backwards. Prior to that she fell outside when there was soft and uneven ground she was not able to accommodate for. Pt reports she lost the exercises she had been discharged with from therapy. In addition pt is having new onset of right knee pain and feels she may need to get it replaced as she got other knee replaced in the past. Pt is familiar to this clinic and has been treated here for similar problems in the past.  PAIN:  Are you having pain?  Yes, 2 out of 10 pain in knees bilaterally with increased pain noted on the right side compared to the left  PRECAUTIONS: Fall WEIGHT BEARING RESTRICTIONS: No FALLS: Has patient fallen in last  6 months? Yes. Number of falls 10-12 (per patient)  PATIENT GOALS: Get up from floor better, get up from chairs better, improve balance, decrease falls.   OBJECTIVE:    TODAY'S TREATMENT:                                                                                                                              DATE: 12/05/22  Physical therapy treatment session today consisted of completing assessment of goals and administration of testing as demonstrated and documented in flow sheet, treatment, and goals section of this note. Addition treatments may be found below.    [facility] PT Assessment - 12/05/22 0001       Dynamic Gait Index   Level Surface Normal    Change in Gait Speed Normal    Gait with Horizontal Head Turns Mild Impairment    Gait with Vertical Head Turns Mild Impairment    Gait and Pivot Turn Normal    Step Over Obstacle Mild Impairment    Step Around Obstacles Normal    Steps Mild Impairment    Total Score 20             Review of HEP / TE   2 x 10 standing marches  Split stance with vertical head nods x 15 nods  Split stance with lateral head nods x 15 nods   PATIENT EDUCATION: Education details: Importance of monitoring knee pain and following up with proper channels for increased pain including her positions. Person educated: Patient Education method: Explanation Education comprehension: verbalized understanding  HOME EXERCISE PROGRAM:  Access Code: [email or web address] URL: [email or web address] Date: 10/06/2022 Prepared by: Thresa Ross Initiated and instructed in the following HEP   Exercises - Standing March with Counter Support  - 1 x daily - 7 x weekly - 2 sets - 10 reps - 2 sec  hold - standing in split stance with vertical head nods   - 1 x daily - 7 x weekly - 2 sets - 15 reps - standing in split stance with vertical head nods   - 1 x daily - 7 x weekly - 2 sets - 15 reps     GOALS: Goals reviewed with patient? Yes  SHORT TERM  GOALS: Target date: 10/10/2022   Patient will be independent in home exercise program to improve strength/mobility for better functional independence with ADLs. Baseline: No HEP currently, lost HEP from last therapy 12/05/22: Pt sick with pneumonia and this is first visit back, was doing them prior to illness  Goal status: NOT MET   LONG TERM GOALS: Target date: 11/10/2022   1.  Patient (> 59 years old) will complete five times sit to stand test in < 15 seconds indicating an increased LE strength and improved balance. Baseline: 17.3 sec 12/05/22:13.4 sec Goal status: MET  2.  Patient will increase FOTO score to equal to or greater than  65   to demonstrate statistically significant improvement in mobility and quality of life.  Baseline: 59 4/22: 60 Goal status: ONGOING   3.  Patient will increase DGI Balance score by > 4 points to demonstrate decreased fall risk during functional activities. Baseline: 19 12/05/22: 20 Goal status: ONGOING  4.  Patient will increase six minute walk test distance to >1300 for progression to community ambulator and improve gait ability Baseline: 1180; 10/11/22: 1359ft no device, no LOB, 4/22: 600 feet, had to stop due to coughing from bronchial spasms  Goal status: ONGOING    ASSESSMENT: CLINICAL IMPRESSION:  Pt presents to PT for progress note. It is important to note that pt has been over a month since her last treatment as pt has been recovering from pneumonia and bronchitis and her progress may have been slowed or regressed as a result. Pt makes progress with her Dgi test showing slight improvement in balance responses dynamically. Pt also shows significant improvement in balance in her 5XSTS showing decreased risk for falls as well as improvement in functional muscular strength. Pt will continue to benefit from skilled physical therapy intervention to address impairments, improve QOL, and attain therapy goals. . Patient's condition has the potential to  improve in response to therapy. Maximum improvement is yet to be obtained. The anticipated improvement is attainable and reasonable in a generally predictable time.        OBJECTIVE IMPAIRMENTS: Abnormal gait, decreased activity tolerance, decreased balance, decreased endurance, decreased mobility, and difficulty walking.   ACTIVITY LIMITATIONS: standing, squatting, stairs, and locomotion level PARTICIPATION LIMITATIONS: community activity and yard work PERSONAL FACTORS: Age and Past/current experiences are also affecting patient's functional outcome.  REHAB POTENTIAL: Good CLINICAL DECISION MAKING: Stable/uncomplicated EVALUATION COMPLEXITY: Low  PLAN:  PT FREQUENCY: 1-2x/week PT DURATION: 8 weeks PLANNED INTERVENTIONS: Therapeutic exercises, Therapeutic activity, Neuromuscular re-education, Balance training, Gait training, Patient/Family education, Self Care, Joint mobilization, Stair training, Vestibular training, Dry Needling, Moist heat, and Manual therapy  PLAN FOR NEXT SESSION: Balance HEP, endurance as indicated, high level dynamic balance interventions     Norman Herrlich, PT 12/05/2022, 4:09 PM  4:09 PM, 12/05/22 Norman Herrlich  PT ,DPT Physical Therapist- [place]  [facility]

## 2022-12-06 ENCOUNTER — Ambulatory Visit: Payer: 59 | Admitting: Physical Therapy

## 2022-12-08 ENCOUNTER — Ambulatory Visit: Payer: 59 | Admitting: Physical Therapy

## 2022-12-13 ENCOUNTER — Ambulatory Visit: Payer: 59 | Admitting: Physical Therapy

## 2022-12-13 ENCOUNTER — Encounter: Payer: Self-pay | Admitting: Dermatology

## 2022-12-13 ENCOUNTER — Ambulatory Visit (INDEPENDENT_AMBULATORY_CARE_PROVIDER_SITE_OTHER): Payer: 59 | Admitting: Dermatology

## 2022-12-13 VITALS — BP 120/78

## 2022-12-13 DIAGNOSIS — C44519 Basal cell carcinoma of skin of other part of trunk: Secondary | ICD-10-CM

## 2022-12-13 DIAGNOSIS — X32XXXA Exposure to sunlight, initial encounter: Secondary | ICD-10-CM

## 2022-12-13 DIAGNOSIS — C4491 Basal cell carcinoma of skin, unspecified: Secondary | ICD-10-CM

## 2022-12-13 DIAGNOSIS — D1801 Hemangioma of skin and subcutaneous tissue: Secondary | ICD-10-CM

## 2022-12-13 DIAGNOSIS — R2689 Other abnormalities of gait and mobility: Secondary | ICD-10-CM

## 2022-12-13 DIAGNOSIS — L578 Other skin changes due to chronic exposure to nonionizing radiation: Secondary | ICD-10-CM

## 2022-12-13 DIAGNOSIS — D229 Melanocytic nevi, unspecified: Secondary | ICD-10-CM

## 2022-12-13 DIAGNOSIS — Z1283 Encounter for screening for malignant neoplasm of skin: Secondary | ICD-10-CM | POA: Diagnosis not present

## 2022-12-13 DIAGNOSIS — L821 Other seborrheic keratosis: Secondary | ICD-10-CM

## 2022-12-13 DIAGNOSIS — L570 Actinic keratosis: Secondary | ICD-10-CM | POA: Diagnosis not present

## 2022-12-13 DIAGNOSIS — W908XXA Exposure to other nonionizing radiation, initial encounter: Secondary | ICD-10-CM

## 2022-12-13 DIAGNOSIS — R2681 Unsteadiness on feet: Secondary | ICD-10-CM

## 2022-12-13 DIAGNOSIS — L814 Other melanin hyperpigmentation: Secondary | ICD-10-CM

## 2022-12-13 DIAGNOSIS — Z872 Personal history of diseases of the skin and subcutaneous tissue: Secondary | ICD-10-CM

## 2022-12-13 DIAGNOSIS — L820 Inflamed seborrheic keratosis: Secondary | ICD-10-CM | POA: Diagnosis not present

## 2022-12-13 DIAGNOSIS — D485 Neoplasm of uncertain behavior of skin: Secondary | ICD-10-CM

## 2022-12-13 DIAGNOSIS — L988 Other specified disorders of the skin and subcutaneous tissue: Secondary | ICD-10-CM

## 2022-12-13 DIAGNOSIS — Z85828 Personal history of other malignant neoplasm of skin: Secondary | ICD-10-CM

## 2022-12-13 DIAGNOSIS — M6281 Muscle weakness (generalized): Secondary | ICD-10-CM

## 2022-12-13 HISTORY — DX: Basal cell carcinoma of skin, unspecified: C44.91

## 2022-12-13 NOTE — Progress Notes (Signed)
Follow-Up Visit   Subjective  [name] [name] is a 67 y.o. female who presents for the following: Skin Cancer Screening and Full Body Skin Exam  The patient presents for Total-Body Skin Exam (TBSE) for skin cancer screening and mole check. The patient has spots, moles and lesions to be evaluated, some may be new or changing and the patient has concerns that these could be cancer.  Hx SCC, AK's. Patient does have some scaly spots, a spot at left 4th finger and some bumps at chin.   The following portions of the chart were reviewed this encounter and updated as appropriate: medications, allergies, medical history  Review of Systems:  No other skin or systemic complaints except as noted in HPI or Assessment and Plan.  Objective  Well appearing patient in no apparent distress; mood and affect are within normal limits.  A full examination was performed including scalp, head, eyes, ears, nose, lips, neck, chest, axillae, abdomen, back, buttocks, bilateral upper extremities, bilateral lower extremities, hands, feet, fingers, toes, fingernails, and toenails. All findings within normal limits unless otherwise noted below.   Relevant physical exam findings are noted in the Assessment and Plan.  Left  upper abdomen 1.8 x 0.7 cm pink scaly patch  ISK r/o BCC/SCC       L index finger x 3, L hand dorsum x 4, R hand dorsum x 1, R forearm x 4, L lateral ankle x 3, L lower leg x 1, R lateral lower leg x 1 (17) Erythematous thin papules/macules with gritty scale.   L mid back at braline x 1, R malar cheek x 1 (2) Erythematous stuck-on, waxy papule or plaque  chin Rhytides and volume loss with sebaceous hyperplasia    Assessment & Plan   LENTIGINES, SEBORRHEIC KERATOSES, HEMANGIOMAS - Benign normal skin lesions - Benign-appearing - Call for any changes  MELANOCYTIC NEVI - Tan-brown and/or pink-flesh-colored symmetric macules and papules - DIP left 4th finger- 4 x 3 mm smooth flesh  flat papule - Benign appearing on exam today - Observation - Call clinic for new or changing moles - Recommend daily use of broad spectrum spf 30+ sunscreen to sun-exposed areas.   ACTINIC DAMAGE - Chronic condition, secondary to cumulative UV/sun exposure - diffuse scaly erythematous macules with underlying dyspigmentation - Recommend daily broad spectrum sunscreen SPF 30+ to sun-exposed areas, reapply every 2 hours as needed.  - Staying in the shade or wearing long sleeves, sun glasses (UVA+UVB protection) and wide brim hats (4-inch brim around the entire circumference of the hat) are also recommended for sun protection.  - Call for new or changing lesions.  SKIN CANCER SCREENING PERFORMED TODAY.  HISTORY OF SQUAMOUS CELL CARCINOMA OF THE SKIN - No evidence of recurrence today at left pretibial, [facility] 12/2021 - Recommend regular full body skin exams - Recommend daily broad spectrum sunscreen SPF 30+ to sun-exposed areas, reapply every 2 hours as needed.  - Call if any new or changing lesions are noted between office visits  HISTORY OF PRECANCEROUS ACTINIC KERATOSIS - site(s) of PreCancerous Actinic Keratosis clear today. - these may recur and new lesions may form requiring treatment to prevent transformation into skin cancer - observe for new or changing spots and contact [facility] for appointment if occur - photoprotection with sun protective clothing; sunglasses and broad spectrum sunscreen with SPF of at least 30 + and frequent self skin exams recommended - yearly exams by a dermatologist recommended for persons with history of PreCancerous  Actinic Keratoses   Neoplasm of uncertain behavior of skin Left  upper abdomen  Epidermal / dermal shaving  Lesion diameter (cm):  1.8 Informed consent: discussed and consent obtained   Timeout: patient name, date of birth, surgical site, and procedure verified   Patient was prepped and draped in usual sterile fashion: area  prepped with alcohol. Anesthesia: the lesion was anesthetized in a standard fashion   Anesthetic:  1% lidocaine w/ epinephrine 1-100,000 local infiltration Instrument used: flexible razor blade   Hemostasis achieved with: pressure, aluminum chloride and electrodesiccation   Outcome: patient tolerated procedure well    Destruction of lesion  Destruction method: electrodesiccation and curettage   Informed consent: discussed and consent obtained   Curettage performed in three different directions: Yes   Electrodesiccation performed over the curetted area: Yes   Final wound size (cm):  1.8 Hemostasis achieved with:  electrodesiccation Outcome: patient tolerated procedure well with no complications   Post-procedure details: sterile dressing applied and wound care instructions given   Dressing type: petrolatum and bandage    Specimen 1 - Surgical pathology Differential Diagnosis: ISK r/o BCC/SCC  Check Margins: No 1.8 x 0.7 cm pink scaly patch Treated with EDC       AK (actinic keratosis) (17) L index finger x 3, L hand dorsum x 4, R hand dorsum x 1, R forearm x 4, L lateral ankle x 3, L lower leg x 1, R lateral lower leg x 1  Actinic keratoses are precancerous spots that appear secondary to cumulative UV radiation exposure/sun exposure over time. They are chronic with expected duration over 1 year. A portion of actinic keratoses will progress to squamous cell carcinoma of the skin. It is not possible to reliably predict which spots will progress to skin cancer and so treatment is recommended to prevent development of skin cancer.  Recommend daily broad spectrum sunscreen SPF 30+ to sun-exposed areas, reapply every 2 hours as needed.  Recommend staying in the shade or wearing long sleeves, sun glasses (UVA+UVB protection) and wide brim hats (4-inch brim around the entire circumference of the hat). Call for new or changing lesions.  Hypertrophic at lower legs  Destruction of lesion  - L index finger x 3, L hand dorsum x 4, R hand dorsum x 1, R forearm x 4, L lateral ankle x 3, L lower leg x 1, R lateral lower leg x 1  Destruction method: cryotherapy   Informed consent: discussed and consent obtained   Lesion destroyed using liquid nitrogen: Yes   Region frozen until ice ball extended beyond lesion: Yes   Outcome: patient tolerated procedure well with no complications   Post-procedure details: wound care instructions given   Additional details:  Prior to procedure, discussed risks of blister formation, small wound, skin dyspigmentation, or rare scar following cryotherapy. Recommend Vaseline ointment to treated areas while healing.   Inflamed seborrheic keratosis (2) L mid back at braline x 1, R malar cheek x 1  Symptomatic, irritating, patient would like treated.   Destruction of lesion - L mid back at braline x 1, R malar cheek x 1  Destruction method: cryotherapy   Informed consent: discussed and consent obtained   Lesion destroyed using liquid nitrogen: Yes   Region frozen until ice ball extended beyond lesion: Yes   Outcome: patient tolerated procedure well with no complications   Post-procedure details: wound care instructions given   Additional details:  Prior to procedure, discussed risks of blister formation, small wound, skin  dyspigmentation, or rare scar following cryotherapy. Recommend Vaseline ointment to treated areas while healing.   Elastosis of skin chin  Benign, observe.     Return in about 6 months (around 06/14/2023) for AK follow up.  Anise Salvo, RMA, am acting as scribe for Willeen Niece, MD .   Documentation: I have reviewed the above documentation for accuracy and completeness, and I agree with the above.  Willeen Niece, MD

## 2022-12-13 NOTE — Therapy (Signed)
OUTPATIENT PHYSICAL THERAPY TREATMENT   Dates of reporting period  09/15/22   to   12/05/22    Patient Name: [name] [name] MRN: [id number] DOB:1955/12/01, 67 y.o., female Today's Date: 12/13/2022   PCP: Louis Matte, MD  REFERRING PROVIDER: Lonell Face, MD   END OF SESSION:  PT End of Session - 12/13/22 0856     Visit Number 11    Number of Visits 24    Date for PT Re-Evaluation 01/30/23    Authorization Type UHC; Medicaid of [place]    Progress Note Due on Visit 20    PT Start Time [phone]    PT Stop Time 0929    PT Time Calculation (min) 40 min    Equipment Utilized During Treatment Gait belt    Activity Tolerance Patient tolerated treatment well;No increased pain    Behavior During Therapy WFL for tasks assessed/performed                   Past Medical History:  Diagnosis Date   Actinic keratosis    Acute hepatitis C without mention of hepatic coma(070.51)    Arthritis    fingers   Carpal tunnel syndrome of left wrist    Depression    GERD (gastroesophageal reflux disease)    Hypothyroidism    Insomnia    Insomnia    Memory loss    Osteoporosis    Squamous cell carcinoma of skin 12/27/2021   Left pretibial - [facility]   Tremor    Wears dentures    full upper, partial lower   Past Surgical History:  Procedure Laterality Date   ANKLE SURGERY Right    BLADDER TACT     CATARACT EXTRACTION W/PHACO Left 10/13/2021   Procedure: CATARACT EXTRACTION PHACO AND INTRAOCULAR LENS PLACEMENT (IOC) LEFT;  Surgeon: Lockie Mola, MD;  Location: [facility];  Service: Ophthalmology;  Laterality: Left;  3.97 00:49.0   CATARACT EXTRACTION W/PHACO Right 10/27/2021   Procedure: CATARACT EXTRACTION PHACO AND INTRAOCULAR LENS PLACEMENT (IOC) RIGHT 4.50 00:58.9;  Surgeon: Lockie Mola, MD;  Location: [facility];  Service: Ophthalmology;  Laterality: Right;   HAND SURGERY Right    TONSILLECTOMY     Patient Active Problem List   Diagnosis  Date Noted   Hepatitis C 08/19/2016   Depression 08/19/2016   Insomnia 08/19/2016   Hypothyroidism 08/19/2016    ONSET DATE: 04/14/22  REFERRING DIAG: R26.9 (ICD-10-CM) - Abnormality of gait and mobility   THERAPY DIAG:  No diagnosis found.  Rationale for Evaluation and Treatment: Rehabilitation  SUBJECTIVE:  SUBJECTIVE STATEMENT: Pt reports she is doing well today. She had some knee pain intially but it improved once she was done with nustep activity.   PERTINENT HISTORY: Changes since last time: Pt reports since last time she was seen here for therapy she has lost her balance quite a few times. Pt reports she loses her balance about once week. 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 Pt reports her last fall pt was at home and lost her balance and fell backwards. Prior to that she fell outside when there was soft and uneven ground she was not able to accommodate for. Pt reports she lost the exercises she had been discharged with from therapy. In addition pt is having new onset of right knee pain and feels she may need to get it replaced as she got other knee replaced in the past. Pt is familiar to this clinic and has been treated here for similar problems in the past.  PAIN:  Are you having pain?  Yes, 2 out of 10 pain in knees bilaterally with increased pain noted on the right side compared to the left  PRECAUTIONS: Fall WEIGHT BEARING RESTRICTIONS: No FALLS: Has patient fallen in last 6 months? Yes. Number of falls 10-12 (per patient)  PATIENT GOALS: Get up from floor better, get up from chairs better, improve balance, decrease falls.   OBJECTIVE:    TODAY'S TREATMENT:                                                                                                                               DATE: 12/13/22  Nustep interval training level 1 and level 3 x 1 min ea x 4 rounds.   Seated hamstring curl 2 x 10 reps ea LE   Standing in airex with adducted stance sorting letters into colors for dual task x several minutes until letters on white board were sorted, no LOB good ability to manage dual task   Standing on airex mat with UE support with 4# AW donned  - 2 x 10 hip ABD  -2 x 10 hip extension  -2 x 10 marching  Seated calmshell GTB 20 x 5 sec hods  PATIENT EDUCATION: Education details: Importance of monitoring knee pain and following up with proper channels for increased pain including her positions. Person educated: Patient Education method: Explanation Education comprehension: verbalized understanding  HOME EXERCISE PROGRAM:  Access Code: [email or web address] URL: [email or web address] Date: 10/06/2022 Prepared by: Thresa Ross Initiated and instructed in the following HEP   Exercises - Standing March with Counter Support  - 1 x daily - 7 x weekly - 2 sets - 10 reps - 2 sec  hold - standing in split stance with vertical head nods   - 1 x daily - 7 x weekly - 2 sets - 15 reps - standing in split stance with vertical head nods   -  1 x daily - 7 x weekly - 2 sets - 15 reps     GOALS: Goals reviewed with patient? Yes  SHORT TERM GOALS: Target date: 10/10/2022   Patient will be independent in home exercise program to improve strength/mobility for better functional independence with ADLs. Baseline: No HEP currently, lost HEP from last therapy 12/05/22: Pt sick with pneumonia and this is first visit back, was doing them prior to illness  Goal status: NOT MET   LONG TERM GOALS: Target date: 11/10/2022   1.  Patient (> 23 years old) will complete five times sit to stand test in < 15 seconds indicating an increased LE strength and improved balance. Baseline: 17.3 sec 12/05/22:13.4 sec Goal status:  MET  2.  Patient will increase FOTO score to equal to or greater than  65   to demonstrate statistically significant improvement in mobility and quality of life.  Baseline: 59 4/22: 60 Goal status: ONGOING   3.  Patient will increase DGI Balance score by > 4 points to demonstrate decreased fall risk during functional activities. Baseline: 19 12/05/22: 20 Goal status: ONGOING  4.  Patient will increase six minute walk test distance to >1300 for progression to community ambulator and improve gait ability Baseline: 1180; 10/11/22: 1360ft no device, no LOB, 4/22: 600 feet, had to stop due to coughing from bronchial spasms  Goal status: ONGOING    ASSESSMENT: CLINICAL IMPRESSION:  Continued with current plan of care as laid out in evaluation and recent prior sessions. Pt remains motivated to advance progress toward goals in order to maximize independence and safety at home. Pt requires high level assistance and cuing for completion of exercises in order to provide adequate level of stimulation challenge while minimizing pain and discomfort when possible. Pt closely monitored throughout session pt response and to maximize patient safety during interventions. Pt continues to demonstrate progress toward goals AEB progression of interventions this date either in volume or intensity.      OBJECTIVE IMPAIRMENTS: Abnormal gait, decreased activity tolerance, decreased balance, decreased endurance, decreased mobility, and difficulty walking.   ACTIVITY LIMITATIONS: standing, squatting, stairs, and locomotion level PARTICIPATION LIMITATIONS: community activity and yard work PERSONAL FACTORS: Age and Past/current experiences are also affecting patient's functional outcome.  REHAB POTENTIAL: Good CLINICAL DECISION MAKING: Stable/uncomplicated EVALUATION COMPLEXITY: Low  PLAN:  PT FREQUENCY: 1-2x/week PT DURATION: 8 weeks PLANNED INTERVENTIONS: Therapeutic exercises, Therapeutic activity,  Neuromuscular re-education, Balance training, Gait training, Patient/Family education, Self Care, Joint mobilization, Stair training, Vestibular training, Dry Needling, Moist heat, and Manual therapy  PLAN FOR NEXT SESSION: Balance HEP, endurance as indicated, high level dynamic balance interventions     Norman Herrlich, PT 12/13/2022, 8:59 AM  8:59 AM, 12/13/22 Norman Herrlich PT ,DPT Physical Therapist- [place]  [facility]

## 2022-12-13 NOTE — Patient Instructions (Addendum)
Cryotherapy Aftercare  Wash gently with soap and water everyday.   Apply Vaseline and Band-Aid daily until healed.   Wound Care Instructions  Cleanse wound gently with soap and water once a day then pat dry with clean gauze. Apply a thin coat of Petrolatum (petroleum jelly, "Vaseline") over the wound (unless you have an allergy to this). We recommend that you use a new, sterile tube of Vaseline. Do not pick or remove scabs. Do not remove the yellow or white "healing tissue" from the base of the wound.  Cover the wound with fresh, clean, nonstick gauze and secure with paper tape. You may use Band-Aids in place of gauze and tape if the wound is small enough, but would recommend trimming much of the tape off as there is often too much. Sometimes Band-Aids can irritate the skin.  You should call the office for your biopsy report after 1 week if you have not already been contacted.  If you experience any problems, such as abnormal amounts of bleeding, swelling, significant bruising, significant pain, or evidence of infection, please call the office immediately.  FOR ADULT SURGERY PATIENTS: If you need something for pain relief you may take 1 extra strength Tylenol (acetaminophen) AND 2 Ibuprofen (200mg each) together every 4 hours as needed for pain. (do not take these if you are allergic to them or if you have a reason you should not take them.) Typically, you may only need pain medication for 1 to 3 days.   Melanoma ABCDEs  Melanoma is the most dangerous type of skin cancer, and is the leading cause of death from skin disease.  You are more likely to develop melanoma if you: Have light-colored skin, light-colored eyes, or red or blond hair Spend a lot of time in the sun Tan regularly, either outdoors or in a tanning bed Have had blistering sunburns, especially during childhood Have a close family member who has had a melanoma Have atypical moles or large birthmarks  Early detection of  melanoma is key since treatment is typically straightforward and cure rates are extremely high if we catch it early.   The first sign of melanoma is often a change in a mole or a new dark spot.  The ABCDE system is a way of remembering the signs of melanoma.  A for asymmetry:  The two halves do not match. B for border:  The edges of the growth are irregular. C for color:  A mixture of colors are present instead of an even brown color. D for diameter:  Melanomas are usually (but not always) greater than 6mm - the size of a pencil eraser. E for evolution:  The spot keeps changing in size, shape, and color.  Please check your skin once per month between visits. You can use a small mirror in front and a large mirror behind you to keep an eye on the back side or your body.   If you see any new or changing lesions before your next follow-up, please call to schedule a visit.  Please continue daily skin protection including broad spectrum sunscreen SPF 30+ to sun-exposed areas, reapplying every 2 hours as needed when you're outdoors.    Due to recent changes in healthcare laws, you may see results of your pathology and/or laboratory studies on MyChart before the doctors have had a chance to review them. We understand that in some cases there may be results that are confusing or concerning to you. Please understand that not all results   are received at the same time and often the doctors may need to interpret multiple results in order to provide you with the best plan of care or course of treatment. Therefore, we ask that you please give us 2 business days to thoroughly review all your results before contacting the office for clarification. Should we see a critical lab result, you will be contacted sooner.   If You Need Anything After Your Visit  If you have any questions or concerns for your doctor, please call our main line at [phone] and press option 4 to reach your doctor's medical assistant. If  no one answers, please leave a voicemail as directed and we will return your call as soon as possible. Messages left after 4 pm will be answered the following business day.   You may also send us a message via MyChart. We typically respond to MyChart messages within 1-2 business days.  For prescription refills, please ask your pharmacy to contact our office. Our fax number is [phone].  If you have an urgent issue when the clinic is closed that cannot wait until the next business day, you can page your doctor at the number below.    Please note that while we do our best to be available for urgent issues outside of office hours, we are not available 24/7.   If you have an urgent issue and are unable to reach us, you may choose to seek medical care at your doctor's office, retail clinic, urgent care center, or emergency room.  If you have a medical emergency, please immediately call 911 or go to the emergency department.  Pager Numbers  - Dr. Kowalski: [phone]  - Dr. Moye: [phone]  - Dr. Stewart: [phone]  In the event of inclement weather, please call our main line at [phone] for an update on the status of any delays or closures.  Dermatology Medication Tips: Please keep the boxes that topical medications come in in order to help keep track of the instructions about where and how to use these. Pharmacies typically print the medication instructions only on the boxes and not directly on the medication tubes.   If your medication is too expensive, please contact our office at [phone] option 4 or send us a message through MyChart.   We are unable to tell what your co-pay for medications will be in advance as this is different depending on your insurance coverage. However, we may be able to find a substitute medication at lower cost or fill out paperwork to get insurance to cover a needed medication.   If a prior authorization is required to get your medication  covered by your insurance company, please allow us 1-2 business days to complete this process.  Drug prices often vary depending on where the prescription is filled and some pharmacies may offer cheaper prices.  The website www.goodrx.com contains coupons for medications through different pharmacies. The prices here do not account for what the cost may be with help from insurance (it may be cheaper with your insurance), but the website can give you the price if you did not use any insurance.  - You can print the associated coupon and take it with your prescription to the pharmacy.  - You may also stop by our office during regular business hours and pick up a GoodRx coupon card.  - If you need your prescription sent electronically to a different pharmacy, notify our office through [facility] MyChart or by phone at [phone] option   4.   

## 2022-12-14 ENCOUNTER — Encounter: Payer: Self-pay | Admitting: Obstetrics and Gynecology

## 2022-12-14 ENCOUNTER — Ambulatory Visit (INDEPENDENT_AMBULATORY_CARE_PROVIDER_SITE_OTHER): Payer: 59 | Admitting: Obstetrics and Gynecology

## 2022-12-14 VITALS — BP 119/83 | HR 103 | Ht 65.0 in | Wt 183.0 lb

## 2022-12-14 DIAGNOSIS — N814 Uterovaginal prolapse, unspecified: Secondary | ICD-10-CM
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
## 2022-12-20 ENCOUNTER — Encounter: Payer: Self-pay | Admitting: Physical Therapy

## 2022-12-20 ENCOUNTER — Ambulatory Visit: Payer: 59 | Attending: Neurology | Admitting: Physical Therapy

## 2022-12-20 DIAGNOSIS — M6281 Muscle weakness (generalized): Secondary | ICD-10-CM | POA: Insufficient documentation

## 2022-12-20 DIAGNOSIS — R2681 Unsteadiness on feet: Secondary | ICD-10-CM

## 2022-12-20 DIAGNOSIS — R2689 Other abnormalities of gait and mobility: Secondary | ICD-10-CM | POA: Insufficient documentation

## 2022-12-20 NOTE — Therapy (Signed)
OUTPATIENT PHYSICAL THERAPY TREATMENT    Patient Name: [name] [name] MRN: [id number] DOB:26-Jul-1956, 67 y.o., female Today's Date: 12/20/2022   PCP: Louis Matte, MD  REFERRING PROVIDER: Lonell Face, MD   END OF SESSION:  PT End of Session - 12/20/22 0858     Visit Number 12    Number of Visits 24    Date for PT Re-Evaluation 01/30/23    Authorization Type UHC; Medicaid of [place]    Progress Note Due on Visit 20    PT Start Time [phone]    PT Stop Time 0929    PT Time Calculation (min) 37 min    Equipment Utilized During Treatment Gait belt    Activity Tolerance Patient tolerated treatment well;No increased pain    Behavior During Therapy WFL for tasks assessed/performed                   Past Medical History:  Diagnosis Date   Actinic keratosis    Acute hepatitis C without mention of hepatic coma(070.51)    Arthritis    fingers   Carpal tunnel syndrome of left wrist    Depression    GERD (gastroesophageal reflux disease)    Hypothyroidism    Insomnia    Insomnia    Memory loss    Osteoporosis    Squamous cell carcinoma of skin 12/27/2021   Left pretibial - [facility]   Tremor    Wears dentures    full upper, partial lower   Past Surgical History:  Procedure Laterality Date   ANKLE SURGERY Right    BLADDER TACT     CATARACT EXTRACTION W/PHACO Left 10/13/2021   Procedure: CATARACT EXTRACTION PHACO AND INTRAOCULAR LENS PLACEMENT (IOC) LEFT;  Surgeon: Lockie Mola, MD;  Location: [facility];  Service: Ophthalmology;  Laterality: Left;  3.97 00:49.0   CATARACT EXTRACTION W/PHACO Right 10/27/2021   Procedure: CATARACT EXTRACTION PHACO AND INTRAOCULAR LENS PLACEMENT (IOC) RIGHT 4.50 00:58.9;  Surgeon: Lockie Mola, MD;  Location: [facility];  Service: Ophthalmology;  Laterality: Right;   HAND SURGERY Right    TONSILLECTOMY     Patient Active Problem List   Diagnosis Date Noted   Hepatitis C 08/19/2016   Depression  08/19/2016   Insomnia 08/19/2016   Hypothyroidism 08/19/2016    ONSET DATE: 04/14/22  REFERRING DIAG: R26.9 (ICD-10-CM) - Abnormality of gait and mobility   THERAPY DIAG:  Unsteadiness on feet  Other abnormalities of gait and mobility  Muscle weakness (generalized)  Rationale for Evaluation and Treatment: Rehabilitation  SUBJECTIVE:  SUBJECTIVE STATEMENT: Pt reports she is doing well today. She reports continued knee pain that she is planning to see rheumatologist for as soon as she can get referral sorted out.   PERTINENT HISTORY: Changes since last time: Pt reports since last time she was seen here for therapy she has lost her balance quite a few times. Pt reports she loses her balance about once week. 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 Pt reports her last fall pt was at home and lost her balance and fell backwards. Prior to that she fell outside when there was soft and uneven ground she was not able to accommodate for. Pt reports she lost the exercises she had been discharged with from therapy. In addition pt is having new onset of right knee pain and feels she may need to get it replaced as she got other knee replaced in the past. Pt is familiar to this clinic and has been treated here for similar problems in the past.  PAIN:  Are you having pain?  Yes, 2 out of 10 pain in knees bilaterally with increased pain noted on the right side compared to the left  PRECAUTIONS: Fall WEIGHT BEARING RESTRICTIONS: No FALLS: Has patient fallen in last 6 months? Yes. Number of falls 10-12 (per patient)  PATIENT GOALS: Get up from floor better, get up from chairs better, improve balance, decrease falls.   OBJECTIVE:    TODAY'S TREATMENT:                                                                                                                               DATE: 12/20/22 TE Nustep LE warm up to improve knee stiffness and discomfort x 6 min level 1   Seated hamstring curl 2 x 10 reps ea LE GTB  NMR Standing on airex beam sorting letters into colors for dual task x several minutes until letters on white board were sorted, no LOB good ability to manage dual task   TE Standing on airex mat with UE support with 4# AW donned  - 2 x 10 hip ABD  -2 x 10 hip extension  -2 x 10 marching  Seated calmshell BTB 20 x 5 sec hods  Pt session was cut a little short today due to pt arriving a few minutes late to scheduled appointment time and needing to head out a few minutes early secondary to other obligations    PATIENT EDUCATION: Education details: Importance of monitoring knee pain and following up with proper channels for increased pain including her positions. Person educated: Patient Education method: Explanation Education comprehension: verbalized understanding  HOME EXERCISE PROGRAM:  Access Code: [email or web address] URL: [email or web address] Date: 10/06/2022 Prepared by: Thresa Ross Initiated and instructed in the following HEP   Exercises - Standing March with Counter Support  - 1 x daily - 7 x weekly - 2 sets -  10 reps - 2 sec  hold - standing in split stance with vertical head nods   - 1 x daily - 7 x weekly - 2 sets - 15 reps - standing in split stance with vertical head nods   - 1 x daily - 7 x weekly - 2 sets - 15 reps     GOALS: Goals reviewed with patient? Yes  SHORT TERM GOALS: Target date: 10/10/2022   Patient will be independent in home exercise program to improve strength/mobility for better functional independence with ADLs. Baseline: No HEP currently, lost HEP from last therapy 12/05/22: Pt sick with pneumonia and this is first visit back, was doing them prior to illness  Goal status:  NOT MET   LONG TERM GOALS: Target date: 11/10/2022   1.  Patient (> 11 years old) will complete five times sit to stand test in < 15 seconds indicating an increased LE strength and improved balance. Baseline: 17.3 sec 12/05/22:13.4 sec Goal status: MET  2.  Patient will increase FOTO score to equal to or greater than  65   to demonstrate statistically significant improvement in mobility and quality of life.  Baseline: 59 4/22: 60 Goal status: ONGOING   3.  Patient will increase DGI Balance score by > 4 points to demonstrate decreased fall risk during functional activities. Baseline: 19 12/05/22: 20 Goal status: ONGOING  4.  Patient will increase six minute walk test distance to >1300 for progression to community ambulator and improve gait ability Baseline: 1180; 10/11/22: 1359ft no device, no LOB, 4/22: 600 feet, had to stop due to coughing from bronchial spasms  Goal status: ONGOING    ASSESSMENT: CLINICAL IMPRESSION:  Continued with current plan of care as laid out in evaluation and recent prior sessions. Pt remains motivated to advance progress toward goals in order to maximize independence and safety at home. Pt requires high level assistance and cuing for completion of exercises in order to provide adequate level of stimulation challenge while minimizing pain and discomfort when possible. Pt continues with LE strength for hip and ankle stability and balance as well as general muscular and cardiovascular endurance. Pt continues to demonstrate progress toward goals AEB progression of interventions this date either in volume or intensity.      OBJECTIVE IMPAIRMENTS: Abnormal gait, decreased activity tolerance, decreased balance, decreased endurance, decreased mobility, and difficulty walking.   ACTIVITY LIMITATIONS: standing, squatting, stairs, and locomotion level PARTICIPATION LIMITATIONS: community activity and yard work PERSONAL FACTORS: Age and Past/current experiences are  also affecting patient's functional outcome.  REHAB POTENTIAL: Good CLINICAL DECISION MAKING: Stable/uncomplicated EVALUATION COMPLEXITY: Low  PLAN:  PT FREQUENCY: 1-2x/week PT DURATION: 8 weeks PLANNED INTERVENTIONS: Therapeutic exercises, Therapeutic activity, Neuromuscular re-education, Balance training, Gait training, Patient/Family education, Self Care, Joint mobilization, Stair training, Vestibular training, Dry Needling, Moist heat, and Manual therapy  PLAN FOR NEXT SESSION: Balance HEP, endurance as indicated, high level dynamic balance interventions     Norman Herrlich, PT 12/20/2022, 8:59 AM  8:59 AM, 12/20/22 Norman Herrlich PT ,DPT Physical Therapist- [facility]

## 2022-12-22 ENCOUNTER — Encounter: Payer: Self-pay | Admitting: Physical Therapy

## 2022-12-22 ENCOUNTER — Ambulatory Visit: Payer: 59 | Admitting: Physical Therapy

## 2022-12-22 ENCOUNTER — Encounter: Payer: Self-pay | Admitting: Pulmonary Disease

## 2022-12-22 ENCOUNTER — Ambulatory Visit (INDEPENDENT_AMBULATORY_CARE_PROVIDER_SITE_OTHER): Payer: 59 | Admitting: Pulmonary Disease

## 2022-12-22 VITALS — BP 124/82 | HR 94 | Temp 97.9°F | Ht 65.0 in | Wt 181.0 lb

## 2022-12-22 DIAGNOSIS — M6281 Muscle weakness (generalized): Secondary | ICD-10-CM

## 2022-12-22 DIAGNOSIS — K219 Gastro-esophageal reflux disease without esophagitis: Secondary | ICD-10-CM

## 2022-12-22 DIAGNOSIS — R9389 Abnormal findings on diagnostic imaging of other specified body structures: Secondary | ICD-10-CM | POA: Diagnosis not present

## 2022-12-22 DIAGNOSIS — R053 Chronic cough: Secondary | ICD-10-CM

## 2022-12-22 DIAGNOSIS — R2681 Unsteadiness on feet: Secondary | ICD-10-CM | POA: Diagnosis not present

## 2022-12-22 DIAGNOSIS — R2689 Other abnormalities of gait and mobility: Secondary | ICD-10-CM

## 2022-12-22 DIAGNOSIS — J189 Pneumonia, unspecified organism: Secondary | ICD-10-CM

## 2022-12-22 LAB — NITRIC OXIDE: Nitric Oxide: 18

## 2022-12-22 MED ORDER — ESOMEPRAZOLE MAGNESIUM 40 MG PO CPDR: 40.0000 mg | DELAYED_RELEASE_CAPSULE | Freq: Every day | ORAL | 3 refills | Status: DC

## 2022-12-22 NOTE — Progress Notes (Signed)
Subjective:    Patient ID: [name] [name], female    DOB: October 25, 1955, 67 y.o.   MRN: [id number] Patient Care Team: Louis Matte, MD as PCP - General (Internal Medicine) Salena Saner, MD as Consulting Physician (Pulmonary Disease)  Chief Complaint  Patient presents with   Follow-up    No SOB or wheezing. Dry cough.   HPI Patient is a 67 year old remote former smoker with a total of 10-pack-year history of smoking who presents for follow-up of an abnormal chest x-ray and pneumonia.  Patient was initially seen on 07 November 2022.  The patient was referred because of nodular changes that were noted the right lung apex which were consistent with infection.  At the time of her initial evaluation she was noted to have purulent sputum production, shortness of breath and cough.  We treated her with broad-spectrum antibiotics follow-up chest x-ray showed resolution of the nodular changes on the right upper lobe after treatment.  Patient did call a number of times after treatment because of persistent cough.  She was provided with a Medrol Dosepak and Symbicort inhaler which she used for a short period of time but did not alter her cough.  She quit taking the Symbicort.  She states that her cough during the day has decreased some and now it mostly occurs at nighttime when she is recumbent.  She does note significant gastroesophageal reflux symptoms which she cannot elaborate on on her initial visit.  She states that she is having quite a bit of reflux particularly at nighttime.  She is currently on Prilosec 20 mg daily for this but it is not effective.  He has not had any fevers, chills or sweats since her initial visit here.  Cough has been mostly dry and nonproductive after her pneumonia resolved.  She has had no shortness of breath or wheezing.  Overall feels better with the exception of her nocturnal cough.  Review of Systems A 10 point review of systems was performed and it is as noted  above otherwise negative.  Patient Active Problem List   Diagnosis Date Noted   Hepatitis C 08/19/2016   Depression 08/19/2016   Insomnia 08/19/2016   Hypothyroidism 08/19/2016   Social History   Tobacco Use   Smoking status: Former    Packs/day: 1.00    Years: 10.00    Additional pack years: 0.00    Total pack years: 10.00    Types: Cigarettes    Quit date: 70    Years since quitting: 41.3   Smokeless tobacco: Never  Substance Use Topics   Alcohol use: No   Allergies  Allergen Reactions   Sonata [Zaleplon]     Took with another medication with this and it caused hallucinations.   Carbidopa-Levodopa Nausea Only   Current Meds  Medication Sig   amitriptyline (ELAVIL) 50 MG tablet Take 150 mg by mouth at bedtime as needed for sleep.    ARIPiprazole (ABILIFY) 5 MG tablet Take 5 mg by mouth daily.   buPROPion (WELLBUTRIN SR) 150 MG 12 hr tablet Take 150 mg by mouth 3 (three) times daily.    calcium carbonate (OS-CAL) 600 MG TABS tablet Take 600 mg by mouth 2 (two) times daily with a meal.   Cholecalciferol 100 MCG (4000 UT) CAPS Take 4,000 Units by mouth daily.   donepezil (ARICEPT) 10 MG tablet Take 10 mg by mouth at bedtime.   glucosamine-chondroitin 500-400 MG tablet Take 1 tablet by mouth in the morning and  at bedtime.   levothyroxine (SYNTHROID) 112 MCG tablet Take 112 mcg by mouth daily before breakfast.   Multiple Vitamins-Minerals (MULTIVITAL-M) TABS Take 1 tablet by mouth daily.    mupirocin ointment (BACTROBAN) 2 % Apply 1 Application topically daily. Qd to wound on left lower leg until resolved   omeprazole (PRILOSEC) 20 MG capsule Take 20 mg by mouth daily.   ondansetron (ZOFRAN) 8 MG tablet Take 8 mg by mouth 3 (three) times daily.   Venlafaxine HCl 150 MG TB24 Take 2 tablets by mouth daily.       Objective:   Physical Exam BP 124/82 (BP Location: Left Arm, Cuff Size: Normal)   Pulse 94   Temp 97.9 F (36.6 C)   Ht 5\' 5"  (1.651 m)   Wt 181 lb (82.1  kg)   SpO2 96%   BMI 30.12 kg/m   SpO2: 96 % O2 Device: None (Room air)  GENERAL: Well-developed, overweight woman, no acute distress.  Fully ambulatory.  No conversational dyspnea. HEAD: Normocephalic, atraumatic.  EYES: Pupils equal, round, reactive to light.  No scleral icterus.  MOUTH: Poor dentition, mouth malocclusion. NECK: Supple. No thyromegaly. Trachea midline. No JVD.  No adenopathy. PULMONARY: Good air entry bilaterally.  Coarse, otherwise no adventitious sounds. CARDIOVASCULAR: S1 and S2. Regular rate and rhythm.  No rubs, murmurs or gallops heard. ABDOMEN: Benign. MUSCULOSKELETAL: No joint deformity, no clubbing, no edema.  NEUROLOGIC: No overt focal deficit, no gait disturbance, speech is fluent. SKIN: Intact,warm,dry.  Multiple actinic keratoses. PSYCH: Mood and behavior normal.  Chest x-ray performed 25 November 2022, independently reviewed, showing no acute disease:   Lab Results  Component Value Date   NITRICOXIDE 18 12/22/2022       Assessment & Plan:     ICD-10-CM   1. Chronic cough  R05.3 Nitric oxide    Ambulatory referral to Gastroenterology   Suspect triggered by gastroesophageal reflux Patient was counseled regards to antireflux measures    2. Gastroesophageal reflux disease, unspecified whether esophagitis present  K21.9 Ambulatory referral to Gastroenterology   Switch to Nexium 40 mg daily Pepcid at nighttime Referral to gastroenterology    3. Community acquired pneumonia of right upper lobe of lung  J18.9    Clinically and radiographically resolved    4. Abnormal chest x-ray  R93.89    Nodular changes RUL resolved on follow-up chest x-ray Changes were due to pneumonia at the time of initial examination     Orders Placed This Encounter  Procedures   Ambulatory referral to Gastroenterology    Referral Priority:   Routine    Referral Type:   Consultation    Referral Reason:   Specialty Services Required    Referred to Provider:   Toney Reil, MD    Number of Visits Requested:   1   Nitric oxide   Meds ordered this encounter  Medications   esomeprazole (NEXIUM) 40 MG capsule    Sig: Take 1 capsule (40 mg total) by mouth daily.    Dispense:  30 capsule    Refill:  3   We discussed with the patient that cough is likely more related to reflux.  She was counseled regards to proper antireflux measures.  We have switched her to Nexium 40 mg daily with Pepcid at bedtime.  She has been referred to gastroenterology for evaluation of this issue.  We will see the patient in follow-up in 3 months time she is to call sooner should any problems arise.  Renold Don, MD Advanced Bronchoscopy PCCM [facility]    *This note was dictated using voice recognition software/Dragon.  Despite best efforts to proofread, errors can occur which can change the meaning. Any transcriptional errors that result from this process are unintentional and may not be fully corrected at the time of dictation.

## 2022-12-22 NOTE — Therapy (Signed)
OUTPATIENT PHYSICAL THERAPY TREATMENT    Patient Name: [name] [name] MRN: [id number] DOB:04-10-1956, 67 y.o., female Today's Date: 12/22/2022   PCP: Louis Matte, MD  REFERRING PROVIDER: Lonell Face, MD   END OF SESSION:  PT End of Session - 12/22/22 0850     Visit Number 13    Number of Visits 24    Date for PT Re-Evaluation 01/30/23    Authorization Type UHC; Medicaid of [place]    Progress Note Due on Visit 20    PT Start Time [phone]    PT Stop Time 0928    PT Time Calculation (min) 41 min    Equipment Utilized During Treatment Gait belt    Activity Tolerance Patient tolerated treatment well;No increased pain    Behavior During Therapy WFL for tasks assessed/performed                   Past Medical History:  Diagnosis Date   Actinic keratosis    Acute hepatitis C without mention of hepatic coma(070.51)    Arthritis    fingers   Carpal tunnel syndrome of left wrist    Depression    GERD (gastroesophageal reflux disease)    Hypothyroidism    Insomnia    Insomnia    Memory loss    Osteoporosis    Squamous cell carcinoma of skin 12/27/2021   Left pretibial - [facility]   Tremor    Wears dentures    full upper, partial lower   Past Surgical History:  Procedure Laterality Date   ANKLE SURGERY Right    BLADDER TACT     CATARACT EXTRACTION W/PHACO Left 10/13/2021   Procedure: CATARACT EXTRACTION PHACO AND INTRAOCULAR LENS PLACEMENT (IOC) LEFT;  Surgeon: Lockie Mola, MD;  Location: [facility];  Service: Ophthalmology;  Laterality: Left;  3.97 00:49.0   CATARACT EXTRACTION W/PHACO Right 10/27/2021   Procedure: CATARACT EXTRACTION PHACO AND INTRAOCULAR LENS PLACEMENT (IOC) RIGHT 4.50 00:58.9;  Surgeon: Lockie Mola, MD;  Location: [facility];  Service: Ophthalmology;  Laterality: Right;   HAND SURGERY Right    TONSILLECTOMY     Patient Active Problem List   Diagnosis Date Noted   Hepatitis C 08/19/2016   Depression  08/19/2016   Insomnia 08/19/2016   Hypothyroidism 08/19/2016    ONSET DATE: 04/14/22  REFERRING DIAG: R26.9 (ICD-10-CM) - Abnormality of gait and mobility   THERAPY DIAG:  Unsteadiness on feet  Other abnormalities of gait and mobility  Muscle weakness (generalized)  Rationale for Evaluation and Treatment: Rehabilitation  SUBJECTIVE:  SUBJECTIVE STATEMENT: Pt reports she is doing well today. She has continued knee pain and some foot pain she feels is related to arthritis, pain is not limiting at this time but patient would like it to be monitored throughout session to prevent exacerbation.  PERTINENT HISTORY: Changes since last time: Pt reports since last time she was seen here for therapy she has lost her balance quite a few times. Pt reports she loses her balance about once week. 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 Pt reports her last fall pt was at home and lost her balance and fell backwards. Prior to that she fell outside when there was soft and uneven ground she was not able to accommodate for. Pt reports she lost the exercises she had been discharged with from therapy. In addition pt is having new onset of right knee pain and feels she may need to get it replaced as she got other knee replaced in the past. Pt is familiar to this clinic and has been treated here for similar problems in the past.  PAIN:  Are you having pain?  Yes, 2 out of 10 pain in knees bilaterally with increased pain noted on the right side compared to the left  PRECAUTIONS: Fall WEIGHT BEARING RESTRICTIONS: No FALLS: Has patient fallen in last 6 months? Yes. Number of falls 10-12 (per patient)  PATIENT GOALS: Get up from floor better, get up from chairs  better, improve balance, decrease falls.   OBJECTIVE:    TODAY'S TREATMENT:                                                                                                                              DATE: 12/22/22 TE Nustep LE interval training level 1 and level 3 x 1 min ea x 3 rounds, pt reports knee pain improved with this movement  TE Standing on airex mat with UE support with 4# AW donned  - 2 x 10 hip ABD  -2 x 10 hip extension  -2 x 10 marching  Seated calmshell GTB 20 x 5 sec holds  NMR  Airex beam side stepping x 6 times ea direction, intermittent UE assist throughout.   Standing on Airex performing mini squat picking up cones from 4 inch step completing 2 sets of 8 repetitions, no increase in knee pain with this working on stability for performing gardening type tasks similar to this motion.   PATIENT EDUCATION: Education details: Importance of monitoring knee pain and following up with proper channels for increased pain including her positions. Person educated: Patient Education method: Explanation Education comprehension: verbalized understanding  HOME EXERCISE PROGRAM:  Access Code: [email or web address] URL: [email or web address] Date: 10/06/2022 Prepared by: Thresa Ross Initiated and instructed in the following HEP   Exercises - Standing March with Counter Support  - 1 x daily - 7 x weekly - 2 sets - 10 reps -  2 sec  hold - standing in split stance with vertical head nods   - 1 x daily - 7 x weekly - 2 sets - 15 reps - standing in split stance with vertical head nods   - 1 x daily - 7 x weekly - 2 sets - 15 reps     GOALS: Goals reviewed with patient? Yes  SHORT TERM GOALS: Target date: 10/10/2022   Patient will be independent in home exercise program to improve strength/mobility for better functional independence with ADLs. Baseline: No HEP currently, lost HEP from last therapy 12/05/22: Pt sick with pneumonia and this is first visit back,  was doing them prior to illness  Goal status: NOT MET   LONG TERM GOALS: Target date: 11/10/2022   1.  Patient (> 93 years old) will complete five times sit to stand test in < 15 seconds indicating an increased LE strength and improved balance. Baseline: 17.3 sec 12/05/22:13.4 sec Goal status: MET  2.  Patient will increase FOTO score to equal to or greater than  65   to demonstrate statistically significant improvement in mobility and quality of life.  Baseline: 59 4/22: 60 Goal status: ONGOING   3.  Patient will increase DGI Balance score by > 4 points to demonstrate decreased fall risk during functional activities. Baseline: 19 12/05/22: 20 Goal status: ONGOING  4.  Patient will increase six minute walk test distance to >1300 for progression to community ambulator and improve gait ability Baseline: 1180; 10/11/22: 1312ft no device, no LOB, 4/22: 600 feet, had to stop due to coughing from bronchial spasms  Goal status: ONGOING    ASSESSMENT: CLINICAL IMPRESSION:  Continued with current plan of care as laid out in evaluation and recent prior sessions. Pt remains motivated to advance progress toward goals in order to maximize independence and safety at home. Pt requires high level assistance and cuing for completion of exercises in order to provide adequate level of stimulation challenge while minimizing pain and discomfort when possible. Pt continues with LE strength for hip and ankle stability and balance as well as general muscular and cardiovascular endurance. Pt continues to demonstrate progress toward goals AEB progression of interventions this date either in volume or intensity.      OBJECTIVE IMPAIRMENTS: Abnormal gait, decreased activity tolerance, decreased balance, decreased endurance, decreased mobility, and difficulty walking.   ACTIVITY LIMITATIONS: standing, squatting, stairs, and locomotion level PARTICIPATION LIMITATIONS: community activity and yard work PERSONAL  FACTORS: Age and Past/current experiences are also affecting patient's functional outcome.  REHAB POTENTIAL: Good CLINICAL DECISION MAKING: Stable/uncomplicated EVALUATION COMPLEXITY: Low  PLAN:  PT FREQUENCY: 1-2x/week PT DURATION: 8 weeks PLANNED INTERVENTIONS: Therapeutic exercises, Therapeutic activity, Neuromuscular re-education, Balance training, Gait training, Patient/Family education, Self Care, Joint mobilization, Stair training, Vestibular training, Dry Needling, Moist heat, and Manual therapy  PLAN FOR NEXT SESSION: Balance HEP, endurance as indicated, high level dynamic balance interventions     Norman Herrlich, PT 12/22/2022, 9:58 AM  9:58 AM, 12/22/22 Norman Herrlich PT ,DPT Physical Therapist- [facility]

## 2022-12-22 NOTE — Patient Instructions (Signed)
I think your cough is more related to reflux.  Make sure that you do not go to bed right after eating the last meal of the day.  Wait at least 2 to 3 hours before going to bed after eating the last meal of the day.  We have increased the medicine for reflux and changed it to 1 called Nexium this is 1 capsule daily.  Take it at least 30 minutes before eating in the morning.  Take Pepcid 20 mg OTC (over-the-counter) at bedtime.  We will make a referral to the gastroenterologist to help you with your reflux issues.  We will see you in follow-up in 3 months time, call sooner should any new problems arise.

## 2022-12-26 ENCOUNTER — Telehealth: Payer: Self-pay

## 2022-12-26 NOTE — Telephone Encounter (Signed)
Discussed biopsy results with patient 

## 2022-12-26 NOTE — Telephone Encounter (Signed)
Left message for patient to call for biopsy results. 

## 2022-12-26 NOTE — Telephone Encounter (Signed)
-----   Message from Willeen Niece, MD sent at 12/26/2022  1:40 PM EDT ----- Skin , left upper abdomen BASAL CELL CARCINOMA, SUPERFICIAL AND NODULAR PATTERNS  BCC skin cancer- already treated with EDC at time of biopsy    - please call patient

## 2022-12-26 NOTE — Telephone Encounter (Signed)
-----   Message from Tara Stewart, MD sent at 12/26/2022  1:40 PM EDT ----- Skin , left upper abdomen BASAL CELL CARCINOMA, SUPERFICIAL AND NODULAR PATTERNS  BCC skin cancer- already treated with EDC at time of biopsy    - please call patient 

## 2022-12-27 ENCOUNTER — Ambulatory Visit: Payer: 59 | Admitting: Physical Therapy

## 2022-12-27 DIAGNOSIS — M6281 Muscle weakness (generalized): Secondary | ICD-10-CM

## 2022-12-27 DIAGNOSIS — R2689 Other abnormalities of gait and mobility: Secondary | ICD-10-CM

## 2022-12-27 DIAGNOSIS — R2681 Unsteadiness on feet: Secondary | ICD-10-CM | POA: Diagnosis not present

## 2022-12-27 NOTE — Therapy (Signed)
OUTPATIENT PHYSICAL THERAPY TREATMENT    Patient Name: [name] [name] MRN: [id number] DOB:[birth year], 67 y.o., female Today's Date: 12/27/2022   PCP: Louis Matte, MD  REFERRING PROVIDER: Lonell Face, MD   END OF SESSION:  PT End of Session - 12/27/22 0905     Visit Number 14    Number of Visits 24    Date for PT Re-Evaluation 01/30/23    Authorization Type UHC; Medicaid of     Progress Note Due on Visit 20    PT Start Time [phone]    PT Stop Time 0928    PT Time Calculation (min) 32 min    Equipment Utilized During Treatment Gait belt    Activity Tolerance Patient tolerated treatment well;No increased pain    Behavior During Therapy WFL for tasks assessed/performed                   Past Medical History:  Diagnosis Date   Actinic keratosis    Acute hepatitis C without mention of hepatic coma(070.51)    Arthritis    fingers   Basal cell carcinoma 12/13/2022   left upper abdomen, EDC   Carpal tunnel syndrome of left wrist    Depression    GERD (gastroesophageal reflux disease)    Hypothyroidism    Insomnia    Insomnia    Memory loss    Osteoporosis    Squamous cell carcinoma of skin 12/27/2021   Left pretibial - EDC   Tremor    Wears dentures    full upper, partial lower   Past Surgical History:  Procedure Laterality Date   ANKLE SURGERY Right    BLADDER TACT     CATARACT EXTRACTION W/PHACO Left 10/13/2021   Procedure: CATARACT EXTRACTION PHACO AND INTRAOCULAR LENS PLACEMENT (IOC) LEFT;  Surgeon: Lockie Mola, MD;  Location: [facility];  Service: Ophthalmology;  Laterality: Left;  3.97 00:49.0   CATARACT EXTRACTION W/PHACO Right 10/27/2021   Procedure: CATARACT EXTRACTION PHACO AND INTRAOCULAR LENS PLACEMENT (IOC) RIGHT 4.50 00:58.9;  Surgeon: Lockie Mola, MD;  Location: [facility];  Service: Ophthalmology;  Laterality: Right;   HAND SURGERY Right    TONSILLECTOMY     Patient Active Problem List    Diagnosis Date Noted   Hepatitis C 08/19/2016   Depression 08/19/2016   Insomnia 08/19/2016   Hypothyroidism 08/19/2016    ONSET DATE: 04/14/22  REFERRING DIAG: R26.9 (ICD-10-CM) - Abnormality of gait and mobility   THERAPY DIAG:  Unsteadiness on feet  Other abnormalities of gait and mobility  Muscle weakness (generalized)  Rationale for Evaluation and Treatment: Rehabilitation  SUBJECTIVE:  SUBJECTIVE STATEMENT: Pt reports she is doing well today. She has continued knee pain and some foot pain she feels is related to arthritis, pain is not limiting at this time but patient would like it to be monitored throughout session to prevent exacerbation.  PERTINENT HISTORY: Changes since last time: Pt reports since last time she was seen here for therapy she has lost her balance quite a few times. Pt reports she loses her balance about once week. 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 Pt reports her last fall pt was at home and lost her balance and fell backwards. Prior to that she fell outside when there was soft and uneven ground she was not able to accommodate for. Pt reports she lost the exercises she had been discharged with from therapy. In addition pt is having new onset of right knee pain and feels she may need to get it replaced as she got other knee replaced in the past. Pt is familiar to this clinic and has been treated here for similar problems in the past.  PAIN:  Are you having pain?  Yes, 2 out of 10 pain in knees bilaterally with increased pain noted on the right side compared to the left  PRECAUTIONS: Fall WEIGHT BEARING RESTRICTIONS: No FALLS: Has patient fallen in last 6 months? Yes. Number of falls 10-12 (per patient)   PATIENT GOALS: Get up from floor better, get up from chairs better, improve balance, decrease falls.   OBJECTIVE:    TODAY'S TREATMENT:                                                                                                                              DATE: 12/27/22 TE Nustep LE interval training level 1 and level 3 x 1 min ea x 3 rounds, pt reports knee pain improved with this movement  TE Standing on airex mat with UE support with 5# AW donned  - 2 x 10 hip ABD  -2 x 10 hip extension  -2 x 10 marching   NMR  Activity Description: "flower tending obstacle course" with an obstacle in front of each blaze pod and each blaze pod representing "plant/flower" pt had to tend to then go get water from home base for the next plant.  Activity Setting:  home [place]  Number of Pods:  5 Cycles/Sets:  4 Duration (Time or Hit Count):  12 The Blaze Pod Random setting was chosen to enhance cognitive processing and agility, providing an unpredictable environment to simulate real-world scenarios, and fostering quick reactions and adaptability.  - cues for squat form with activities and " squaring up to objects before beding down to improve stability an dBOS to decrease risk of falls when doing this funcitonally.   PATIENT EDUCATION: Education details: Importance of monitoring knee pain and following up with proper channels for increased pain including her positions. Person  educated: Patient Education method: Explanation Education comprehension: verbalized understanding  HOME EXERCISE PROGRAM:  Access Code: [email or web address] URL: [email or web address] Date: 10/06/2022 Prepared by: Thresa Ross Initiated and instructed in the following HEP   Exercises - Standing March with Counter Support  - 1 x daily - 7 x weekly - 2 sets - 10 reps - 2 sec  hold - standing in split stance with vertical head nods   - 1 x daily - 7 x weekly - 2 sets - 15 reps - standing in split stance with  vertical head nods   - 1 x daily - 7 x weekly - 2 sets - 15 reps     GOALS: Goals reviewed with patient? Yes  SHORT TERM GOALS: Target date: 10/10/2022   Patient will be independent in home exercise program to improve strength/mobility for better functional independence with ADLs. Baseline: No HEP currently, lost HEP from last therapy 12/05/22: Pt sick with pneumonia and this is first visit back, was doing them prior to illness  Goal status: NOT MET   LONG TERM GOALS: Target date: 11/10/2022   1.  Patient (> 49 years old) will complete five times sit to stand test in < 15 seconds indicating an increased LE strength and improved balance. Baseline: 17.3 sec 12/05/22:13.4 sec Goal status: MET  2.  Patient will increase FOTO score to equal to or greater than  65   to demonstrate statistically significant improvement in mobility and quality of life.  Baseline: 59 4/22: 60 Goal status: ONGOING   3.  Patient will increase DGI Balance score by > 4 points to demonstrate decreased fall risk during functional activities. Baseline: 19 12/05/22: 20 Goal status: ONGOING  4.  Patient will increase six minute walk test distance to >1300 for progression to community ambulator and improve gait ability Baseline: 1180; 10/11/22: 1380ft no device, no LOB, 4/22: 600 feet, had to stop due to coughing from bronchial spasms  Goal status: ONGOING    ASSESSMENT: CLINICAL IMPRESSION:  Continued with current plan of care as laid out in evaluation and recent prior sessions. Pt remains motivated to advance progress toward goals in order to maximize independence and safety at home. Pt requires high level assistance and cuing for completion of exercises in order to provide adequate level of stimulation challenge while minimizing pain and discomfort when possible. Pt continues with LE strength for hip and ankle stability and balance as well as general muscular and cardiovascular endurance. The patient demonstrated  significant progress while utilizing [employer], showcasing improved coordination, balance, and cognitive function. The incorporation of dual-tasking technology with color recognition and association with specific movements in Blaze Pods was strategically chosen to provide a dynamic training environment, enabling the patient to engage in simultaneous physical and cognitive tasks. This unique approach enhances not only their physical abilities but also fosters increased neural connectivity and mental awareness, contributing to a well-rounded and effective rehabilitation and training experience. Pt continues to demonstrate progress toward goals AEB progression of interventions this date either in volume or intensity.      OBJECTIVE IMPAIRMENTS: Abnormal gait, decreased activity tolerance, decreased balance, decreased endurance, decreased mobility, and difficulty walking.   ACTIVITY LIMITATIONS: standing, squatting, stairs, and locomotion level PARTICIPATION LIMITATIONS: community activity and yard work PERSONAL FACTORS: Age and Past/current experiences are also affecting patient's functional outcome.  REHAB POTENTIAL: Good CLINICAL DECISION MAKING: Stable/uncomplicated EVALUATION COMPLEXITY: Low  PLAN:  PT FREQUENCY: 1-2x/week PT DURATION: 8 weeks PLANNED INTERVENTIONS: Therapeutic exercises, Therapeutic activity, Neuromuscular re-education,  Balance training, Gait training, Patient/Family education, Self Care, Joint mobilization, Stair training, Vestibular training, Dry Needling, Moist heat, and Manual therapy  PLAN FOR NEXT SESSION: Balance HEP, endurance as indicated, high level dynamic balance interventions     Norman Herrlich, PT 12/27/2022, 9:06 AM  9:06 AM, 12/27/22 Norman Herrlich PT ,DPT Physical Therapist- [place]  [facility]

## 2022-12-29 ENCOUNTER — Ambulatory Visit: Payer: 59 | Admitting: Physical Therapy

## 2023-01-03 ENCOUNTER — Ambulatory Visit: Payer: 59 | Admitting: Physical Therapy

## 2023-01-03 DIAGNOSIS — R2681 Unsteadiness on feet: Secondary | ICD-10-CM

## 2023-01-03 DIAGNOSIS — M6281 Muscle weakness (generalized): Secondary | ICD-10-CM

## 2023-01-03 DIAGNOSIS — R2689 Other abnormalities of gait and mobility: Secondary | ICD-10-CM

## 2023-01-03 NOTE — Therapy (Signed)
OUTPATIENT PHYSICAL THERAPY TREATMENT    Patient Name: [name] [name] MRN: [id number] DOB:[birth year], 68 y.o., female Today's Date: 01/03/2023   PCP: Louis Matte, MD  REFERRING PROVIDER: Lonell Face, MD   END OF SESSION:  PT End of Session - 01/03/23 0846     Visit Number 15    Number of Visits 24    Date for PT Re-Evaluation 01/30/23    Authorization Type UHC; Medicaid of [place]    Progress Note Due on Visit 20    PT Start Time [phone]    PT Stop Time 0928    PT Time Calculation (min) 42 min    Equipment Utilized During Treatment Gait belt    Activity Tolerance Patient tolerated treatment well;No increased pain    Behavior During Therapy WFL for tasks assessed/performed                   Past Medical History:  Diagnosis Date   Actinic keratosis    Acute hepatitis C without mention of hepatic coma(070.51)    Arthritis    fingers   Basal cell carcinoma 12/13/2022   left upper abdomen, EDC   Carpal tunnel syndrome of left wrist    Depression    GERD (gastroesophageal reflux disease)    Hypothyroidism    Insomnia    Insomnia    Memory loss    Osteoporosis    Squamous cell carcinoma of skin 12/27/2021   Left pretibial - EDC   Tremor    Wears dentures    full upper, partial lower   Past Surgical History:  Procedure Laterality Date   ANKLE SURGERY Right    BLADDER TACT     CATARACT EXTRACTION W/PHACO Left 10/13/2021   Procedure: CATARACT EXTRACTION PHACO AND INTRAOCULAR LENS PLACEMENT (IOC) LEFT;  Surgeon: Lockie Mola, MD;  Location: [facility];  Service: Ophthalmology;  Laterality: Left;  3.97 00:49.0   CATARACT EXTRACTION W/PHACO Right 10/27/2021   Procedure: CATARACT EXTRACTION PHACO AND INTRAOCULAR LENS PLACEMENT (IOC) RIGHT 4.50 00:58.9;  Surgeon: Lockie Mola, MD;  Location: [facility];  Service: Ophthalmology;  Laterality: Right;   HAND SURGERY Right    TONSILLECTOMY     Patient Active Problem List    Diagnosis Date Noted   Hepatitis C 08/19/2016   Depression 08/19/2016   Insomnia 08/19/2016   Hypothyroidism 08/19/2016    ONSET DATE: 04/14/22  REFERRING DIAG: R26.9 (ICD-10-CM) - Abnormality of gait and mobility   THERAPY DIAG:  Unsteadiness on feet  Other abnormalities of gait and mobility  Muscle weakness (generalized)  Rationale for Evaluation and Treatment: Rehabilitation  SUBJECTIVE:  SUBJECTIVE STATEMENT: Pt reports she is doing well today. She got some pain meds for her knee from the MD and they have been helping some.   PERTINENT HISTORY: Changes since last time: Pt reports since last time she was seen here for therapy she has lost her balance quite a few times. Pt reports she loses her balance about once week. 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 Pt reports her last fall pt was at home and lost her balance and fell backwards. Prior to that she fell outside when there was soft and uneven ground she was not able to accommodate for. Pt reports she lost the exercises she had been discharged with from therapy. In addition pt is having new onset of right knee pain and feels she may need to get it replaced as she got other knee replaced in the past. Pt is familiar to this clinic and has been treated here for similar problems in the past.  PAIN:  Are you having pain?  Yes, 2 out of 10 pain in knees bilaterally with increased pain noted on the right side compared to the left  PRECAUTIONS: Fall WEIGHT BEARING RESTRICTIONS: No FALLS: Has patient fallen in last 6 months? Yes. Number of falls 10-12 (per patient)  PATIENT GOALS: Get up from floor better, get up from chairs better, improve balance, decrease falls.   OBJECTIVE:     TODAY'S TREATMENT:                                                                                                                              DATE: 01/03/23  TE  Nustep LE interval training level 1 and level 3 x 1 min ea x 3 rounds  TE Standing on airex mat with UE support with 5# AW donned  - 2 x 12 hip ABD  -2 x 10 hip flexion R LE only  NO CHARGE: Ice pack applied to pt R knee secondary to increased knee pain this date that did not relent with normal warm up   NMR  Airex beam x 45 sec ea  - tandem (both sides) and standard stance with toes and heels off of pad   Airex beam side stepping x 5 laps ( 5 times ea direction)   PATIENT EDUCATION: Education details: Importance of monitoring knee pain and following up with proper channels for increased pain including her positions. Person educated: Patient Education method: Explanation Education comprehension: verbalized understanding  HOME EXERCISE PROGRAM:  Access Code: [email or web address] URL: [email or web address] Date: 10/06/2022 Prepared by: Thresa Ross Initiated and instructed in the following HEP   Exercises - Standing March with Counter Support  - 1 x daily - 7 x weekly - 2 sets - 10 reps - 2 sec  hold - standing in split stance with vertical head nods   - 1 x daily - 7 x weekly - 2  sets - 15 reps - standing in split stance with vertical head nods   - 1 x daily - 7 x weekly - 2 sets - 15 reps     GOALS: Goals reviewed with patient? Yes  SHORT TERM GOALS: Target date: 10/10/2022   Patient will be independent in home exercise program to improve strength/mobility for better functional independence with ADLs. Baseline: No HEP currently, lost HEP from last therapy 12/05/22: Pt sick with pneumonia and this is first visit back, was doing them prior to illness  Goal status: NOT MET   LONG TERM GOALS: Target date: 11/10/2022   1.  Patient (> 23 years old) will complete five times sit to stand test in <  15 seconds indicating an increased LE strength and improved balance. Baseline: 17.3 sec 12/05/22:13.4 sec Goal status: MET  2.  Patient will increase FOTO score to equal to or greater than  65   to demonstrate statistically significant improvement in mobility and quality of life.  Baseline: 59 4/22: 60 Goal status: ONGOING   3.  Patient will increase DGI Balance score by > 4 points to demonstrate decreased fall risk during functional activities. Baseline: 19 12/05/22: 20 Goal status: ONGOING  4.  Patient will increase six minute walk test distance to >1300 for progression to community ambulator and improve gait ability Baseline: 1180; 10/11/22: 1338ft no device, no LOB, 4/22: 600 feet, had to stop due to coughing from bronchial spasms  Goal status: ONGOING    ASSESSMENT: CLINICAL IMPRESSION:  Continued with current plan of care as laid out in evaluation and recent prior sessions.Session limited secondary to pt knee pain requiring rest and ice. Pt being seen by multiple doctors for this pain and has prescribed medication. Pt will continue to benefit from skilled physical therapy intervention to address impairments, improve QOL, and attain therapy goals.        OBJECTIVE IMPAIRMENTS: Abnormal gait, decreased activity tolerance, decreased balance, decreased endurance, decreased mobility, and difficulty walking.   ACTIVITY LIMITATIONS: standing, squatting, stairs, and locomotion level PARTICIPATION LIMITATIONS: community activity and yard work PERSONAL FACTORS: Age and Past/current experiences are also affecting patient's functional outcome.  REHAB POTENTIAL: Good CLINICAL DECISION MAKING: Stable/uncomplicated EVALUATION COMPLEXITY: Low  PLAN:  PT FREQUENCY: 1-2x/week PT DURATION: 8 weeks PLANNED INTERVENTIONS: Therapeutic exercises, Therapeutic activity, Neuromuscular re-education, Balance training, Gait training, Patient/Family education, Self Care, Joint mobilization, Stair  training, Vestibular training, Dry Needling, Moist heat, and Manual therapy  PLAN FOR NEXT SESSION: Balance HEP, endurance as indicated, high level dynamic balance interventions     Norman Herrlich, PT 01/03/2023, 8:46 AM  8:46 AM, 01/03/23 Norman Herrlich PT ,DPT Physical Therapist- [place]  [facility]

## 2023-01-05 ENCOUNTER — Ambulatory Visit: Payer: 59 | Admitting: Physical Therapy

## 2023-01-05 ENCOUNTER — Encounter: Payer: Self-pay | Admitting: Physical Therapy

## 2023-01-05 DIAGNOSIS — M6281 Muscle weakness (generalized): Secondary | ICD-10-CM

## 2023-01-05 DIAGNOSIS — R2689 Other abnormalities of gait and mobility: Secondary | ICD-10-CM

## 2023-01-05 DIAGNOSIS — R2681 Unsteadiness on feet: Secondary | ICD-10-CM

## 2023-01-05 NOTE — Therapy (Signed)
OUTPATIENT PHYSICAL THERAPY TREATMENT    Patient Name: [name] [name] MRN: [id number] DOB:Dec 19, 1955, 67 y.o., female Today's Date: 01/05/2023   PCP: Louis Matte, MD  REFERRING PROVIDER: Lonell Face, MD   END OF SESSION:  PT End of Session - 01/05/23 0846     Visit Number 16    Number of Visits 24    Date for PT Re-Evaluation 01/30/23    Authorization Type UHC; Medicaid of [place]    Progress Note Due on Visit 20    PT Start Time [phone]    PT Stop Time 0927    PT Time Calculation (min) 41 min    Equipment Utilized During Treatment Gait belt    Activity Tolerance Patient tolerated treatment well;No increased pain    Behavior During Therapy WFL for tasks assessed/performed                    Past Medical History:  Diagnosis Date   Actinic keratosis    Acute hepatitis C without mention of hepatic coma(070.51)    Arthritis    fingers   Basal cell carcinoma 12/13/2022   left upper abdomen, EDC   Carpal tunnel syndrome of left wrist    Depression    GERD (gastroesophageal reflux disease)    Hypothyroidism    Insomnia    Insomnia    Memory loss    Osteoporosis    Squamous cell carcinoma of skin 12/27/2021   Left pretibial - EDC   Tremor    Wears dentures    full upper, partial lower   Past Surgical History:  Procedure Laterality Date   ANKLE SURGERY Right    BLADDER TACT     CATARACT EXTRACTION W/PHACO Left 10/13/2021   Procedure: CATARACT EXTRACTION PHACO AND INTRAOCULAR LENS PLACEMENT (IOC) LEFT;  Surgeon: Lockie Mola, MD;  Location: [facility];  Service: Ophthalmology;  Laterality: Left;  3.97 00:49.0   CATARACT EXTRACTION W/PHACO Right 10/27/2021   Procedure: CATARACT EXTRACTION PHACO AND INTRAOCULAR LENS PLACEMENT (IOC) RIGHT 4.50 00:58.9;  Surgeon: Lockie Mola, MD;  Location: [facility];  Service: Ophthalmology;  Laterality: Right;   HAND SURGERY Right    TONSILLECTOMY     Patient Active Problem List    Diagnosis Date Noted   Hepatitis C 08/19/2016   Depression 08/19/2016   Insomnia 08/19/2016   Hypothyroidism 08/19/2016    ONSET DATE: 04/14/22  REFERRING DIAG: R26.9 (ICD-10-CM) - Abnormality of gait and mobility   THERAPY DIAG:  Unsteadiness on feet  Other abnormalities of gait and mobility  Muscle weakness (generalized)  Rationale for Evaluation and Treatment: Rehabilitation  SUBJECTIVE:  SUBJECTIVE STATEMENT: Pt reports she is doing well today. She took some hydrocodone for her knee as prescribed by MD and it is helping with the pain.   PERTINENT HISTORY: Changes since last time: Pt reports since last time she was seen here for therapy she has lost her balance quite a few times. Pt reports she loses her balance about once week. 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 Pt reports her last fall pt was at home and lost her balance and fell backwards. Prior to that she fell outside when there was soft and uneven ground she was not able to accommodate for. Pt reports she lost the exercises she had been discharged with from therapy. In addition pt is having new onset of right knee pain and feels she may need to get it replaced as she got other knee replaced in the past. Pt is familiar to this clinic and has been treated here for similar problems in the past.  PAIN:  Are you having pain?  Yes, 2 out of 10 pain in knees bilaterally with increased pain noted on the right side compared to the left  PRECAUTIONS: Fall WEIGHT BEARING RESTRICTIONS: No FALLS: Has patient fallen in last 6 months? Yes. Number of falls 10-12 (per patient)  PATIENT GOALS: Get up from floor better, get up from chairs better, improve balance, decrease falls.    OBJECTIVE:    TODAY'S TREATMENT:                                                                                                                              DATE: 01/05/23  TE  Nustep LE interval training level 1 and level 3 x 1 min ea x 3 rounds  TE  Standing Heel Raises  -3 x 12 reps with cues for eccentric control Sidestepping with RTB around ankles 2 sets of 10 reps ea side  Standing on airex mat with UE support with 5# AW donned   -2 x 10 hip flexion R LE only Instruction in proper STS form to assist with decreasing stress on knees. Multiple reps and practice with this and cues throughout remainder of session.   NMR  Airex beam 3 x 45 sec ea  - tandem (both sides) and standard stance with toes and heels off of pad   - airex marching without UE assist 2 x 10 reps ea LE, cues for slow controlled movement   PATIENT EDUCATION: Education details: Importance of monitoring knee pain and following up with proper channels for increased pain including her positions. Person educated: Patient Education method: Explanation Education comprehension: verbalized understanding  HOME EXERCISE PROGRAM:  Access Code: [email or web address] URL: [email or web address] Date: 10/06/2022 Prepared by: Thresa Ross Initiated and instructed in the following HEP   Exercises - Standing March with Counter Support  - 1 x daily - 7 x weekly - 2 sets - 10  reps - 2 sec  hold - standing in split stance with vertical head nods   - 1 x daily - 7 x weekly - 2 sets - 15 reps - standing in split stance with vertical head nods   - 1 x daily - 7 x weekly - 2 sets - 15 reps     GOALS: Goals reviewed with patient? Yes  SHORT TERM GOALS: Target date: 10/10/2022   Patient will be independent in home exercise program to improve strength/mobility for better functional independence with ADLs. Baseline: No HEP currently, lost HEP from last therapy 12/05/22: Pt sick with pneumonia and this is first  visit back, was doing them prior to illness  Goal status: NOT MET   LONG TERM GOALS: Target date: 11/10/2022   1.  Patient (> 52 years old) will complete five times sit to stand test in < 15 seconds indicating an increased LE strength and improved balance. Baseline: 17.3 sec 12/05/22:13.4 sec Goal status: MET  2.  Patient will increase FOTO score to equal to or greater than  65   to demonstrate statistically significant improvement in mobility and quality of life.  Baseline: 59 4/22: 60 Goal status: ONGOING   3.  Patient will increase DGI Balance score by > 4 points to demonstrate decreased fall risk during functional activities. Baseline: 19 12/05/22: 20 Goal status: ONGOING  4.  Patient will increase six minute walk test distance to >1300 for progression to community ambulator and improve gait ability Baseline: 1180; 10/11/22: 1331ft no device, no LOB, 4/22: 600 feet, had to stop due to coughing from bronchial spasms  Goal status: ONGOING    ASSESSMENT: CLINICAL IMPRESSION:  Continued with current plan of care as laid out in evaluation and recent prior sessions. Continued with LE strengthening interventions for hip and ankle musculature. Pt provided with cues for Improved form with Sit to and from standing transitions to improve knee discomfort with good response and reports of less knee pain. Pt will continue to benefit from skilled physical therapy intervention to address impairments, improve QOL, and attain therapy goals.        OBJECTIVE IMPAIRMENTS: Abnormal gait, decreased activity tolerance, decreased balance, decreased endurance, decreased mobility, and difficulty walking.   ACTIVITY LIMITATIONS: standing, squatting, stairs, and locomotion level PARTICIPATION LIMITATIONS: community activity and yard work PERSONAL FACTORS: Age and Past/current experiences are also affecting patient's functional outcome.  REHAB POTENTIAL: Good CLINICAL DECISION MAKING:  Stable/uncomplicated EVALUATION COMPLEXITY: Low  PLAN:  PT FREQUENCY: 1-2x/week PT DURATION: 8 weeks PLANNED INTERVENTIONS: Therapeutic exercises, Therapeutic activity, Neuromuscular re-education, Balance training, Gait training, Patient/Family education, Self Care, Joint mobilization, Stair training, Vestibular training, Dry Needling, Moist heat, and Manual therapy  PLAN FOR NEXT SESSION: Balance HEP, endurance as indicated, high level dynamic balance interventions     Norman Herrlich, PT 01/05/2023, 8:48 AM  8:48 AM, 01/05/23 Norman Herrlich PT ,DPT Physical Therapist- [place]  [facility]

## 2023-01-06 NOTE — Therapy (Unsigned)
OUTPATIENT PHYSICAL THERAPY TREATMENT    Patient Name: [name] [name] MRN: [id number] DOB:1956/08/05, 67 y.o., female Today's Date: 01/10/2023   PCP: Louis Matte, MD  REFERRING PROVIDER: Lonell Face, MD   END OF SESSION:  PT End of Session - 01/10/23 0843     Visit Number 17    Number of Visits 24    Date for PT Re-Evaluation 01/30/23    Authorization Type UHC; Medicaid of [place]    Progress Note Due on Visit 20    PT Start Time 0845    PT Stop Time 0928    PT Time Calculation (min) 43 min    Equipment Utilized During Treatment Gait belt    Activity Tolerance Patient tolerated treatment well;No increased pain    Behavior During Therapy WFL for tasks assessed/performed                     Past Medical History:  Diagnosis Date   Actinic keratosis    Acute hepatitis C without mention of hepatic coma(070.51)    Arthritis    fingers   Basal cell carcinoma 12/13/2022   left upper abdomen, EDC   Carpal tunnel syndrome of left wrist    Depression    GERD (gastroesophageal reflux disease)    Hypothyroidism    Insomnia    Insomnia    Memory loss    Osteoporosis    Squamous cell carcinoma of skin 12/27/2021   Left pretibial - EDC   Tremor    Wears dentures    full upper, partial lower   Past Surgical History:  Procedure Laterality Date   ANKLE SURGERY Right    BLADDER TACT     CATARACT EXTRACTION W/PHACO Left 10/13/2021   Procedure: CATARACT EXTRACTION PHACO AND INTRAOCULAR LENS PLACEMENT (IOC) LEFT;  Surgeon: Lockie Mola, MD;  Location: [facility];  Service: Ophthalmology;  Laterality: Left;  3.97 00:49.0   CATARACT EXTRACTION W/PHACO Right 10/27/2021   Procedure: CATARACT EXTRACTION PHACO AND INTRAOCULAR LENS PLACEMENT (IOC) RIGHT 4.50 00:58.9;  Surgeon: Lockie Mola, MD;  Location: [facility];  Service: Ophthalmology;  Laterality: Right;   HAND SURGERY Right    TONSILLECTOMY     Patient Active Problem List    Diagnosis Date Noted   Hepatitis C 08/19/2016   Depression 08/19/2016   Insomnia 08/19/2016   Hypothyroidism 08/19/2016    ONSET DATE: 04/14/22  REFERRING DIAG: R26.9 (ICD-10-CM) - Abnormality of gait and mobility   THERAPY DIAG:  Unsteadiness on feet  Other abnormalities of gait and mobility  Muscle weakness (generalized)  Rationale for Evaluation and Treatment: Rehabilitation  SUBJECTIVE:  SUBJECTIVE STATEMENT:   Pt reports improved knee pain from last date. No changes or falls over the weekend.   PERTINENT HISTORY: Changes since last time: Pt reports since last time she was seen here for therapy she has lost her balance quite a few times. Pt reports she loses her balance about once week. 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 Pt reports her last fall pt was at home and lost her balance and fell backwards. Prior to that she fell outside when there was soft and uneven ground she was not able to accommodate for. Pt reports she lost the exercises she had been discharged with from therapy. In addition pt is having new onset of right knee pain and feels she may need to get it replaced as she got other knee replaced in the past. Pt is familiar to this clinic and has been treated here for similar problems in the past.  PAIN:  Are you having pain?  Yes, Right knee pain   PRECAUTIONS: Fall WEIGHT BEARING RESTRICTIONS: No FALLS: Has patient fallen in last 6 months? Yes. Number of falls 10-12 (per patient)  PATIENT GOALS: Get up from floor better, get up from chairs better, improve balance, decrease falls.   OBJECTIVE:    TODAY'S TREATMENT:                                                                                                                               DATE: 01/10/23  TE  Nustep LE interval training level 1 and level 3 x 1 min ea x 3 rounds  TE  Unless otherwise stated, CGA was provided and gait belt donned in order to ensure pt safety  Standing Heel Raises  -2 x 12 reps with cues for eccentric control with 5# AW  Marching without UE support, increased difficulty with LLE stance/ balancing.  Sidestepping with RTB around ankles 2 sets of 10 reps ea side   Instruction in proper STS form to assist with decreasing stress on knees. Multiple reps and practice with this and cues throughout remainder of session.   NMR  Airex beam 2 x 45 sec ea  - tandem (both sides) and standard stance with toes and heels off of pad   - airex marching without UE assist 2 x 10 reps ea LE, cues for slow controlled movement    PATIENT EDUCATION: Education details: Importance of monitoring knee pain and following up with proper channels for increased pain including her positions. Person educated: Patient Education method: Explanation Education comprehension: verbalized understanding  HOME EXERCISE PROGRAM:  Access Code: [email or web address] URL: [email or web address] Date: 10/06/2022 Prepared by: Thresa Ross Initiated and instructed in the following HEP   Exercises - Standing March with Counter Support  - 1 x daily - 7 x weekly - 2 sets - 10 reps - 2 sec  hold - standing in split stance  with vertical head nods   - 1 x daily - 7 x weekly - 2 sets - 15 reps - standing in split stance with vertical head nods   - 1 x daily - 7 x weekly - 2 sets - 15 reps     GOALS: Goals reviewed with patient? Yes  SHORT TERM GOALS: Target date: 10/10/2022   Patient will be independent in home exercise program to improve strength/mobility for better functional independence with ADLs. Baseline: No HEP currently, lost HEP from last therapy 12/05/22: Pt sick with pneumonia and this is first visit back, was doing them prior to  illness  Goal status: NOT MET   LONG TERM GOALS: Target date: 11/10/2022   1.  Patient (> 32 years old) will complete five times sit to stand test in < 15 seconds indicating an increased LE strength and improved balance. Baseline: 17.3 sec 12/05/22:13.4 sec Goal status: MET  2.  Patient will increase FOTO score to equal to or greater than  65   to demonstrate statistically significant improvement in mobility and quality of life.  Baseline: 59 4/22: 60 Goal status: ONGOING   3.  Patient will increase DGI Balance score by > 4 points to demonstrate decreased fall risk during functional activities. Baseline: 19 12/05/22: 20 Goal status: ONGOING  4.  Patient will increase six minute walk test distance to >1300 for progression to community ambulator and improve gait ability Baseline: 1180; 10/11/22: 1320ft no device, no LOB, 4/22: 600 feet, had to stop due to coughing from bronchial spasms  Goal status: ONGOING    ASSESSMENT: CLINICAL IMPRESSION:  Continued with current plan of care as laid out in evaluation and recent prior sessions. Continued with LE strengthening interventions for hip and ankle musculature. Pt progressed with heel raise interventions with increased resistance with good results but required cues for proper performance. Pt will continue to benefit from skilled physical therapy intervention to address impairments, improve QOL, and attain therapy goals.         OBJECTIVE IMPAIRMENTS: Abnormal gait, decreased activity tolerance, decreased balance, decreased endurance, decreased mobility, and difficulty walking.   ACTIVITY LIMITATIONS: standing, squatting, stairs, and locomotion level PARTICIPATION LIMITATIONS: community activity and yard work PERSONAL FACTORS: Age and Past/current experiences are also affecting patient's functional outcome.  REHAB POTENTIAL: Good CLINICAL DECISION MAKING: Stable/uncomplicated EVALUATION COMPLEXITY: Low  PLAN:  PT FREQUENCY:  1-2x/week PT DURATION: 8 weeks PLANNED INTERVENTIONS: Therapeutic exercises, Therapeutic activity, Neuromuscular re-education, Balance training, Gait training, Patient/Family education, Self Care, Joint mobilization, Stair training, Vestibular training, Dry Needling, Moist heat, and Manual therapy  PLAN FOR NEXT SESSION:  Balance HEP, endurance as indicated, high level dynamic balance interventions     Norman Herrlich, PT 01/10/2023, 12:51 PM  12:51 PM, 01/10/23 Norman Herrlich PT ,DPT Physical Therapist- [place]  [facility]

## 2023-01-10 ENCOUNTER — Ambulatory Visit: Payer: 59 | Admitting: Physical Therapy

## 2023-01-10 DIAGNOSIS — R2681 Unsteadiness on feet: Secondary | ICD-10-CM | POA: Diagnosis not present

## 2023-01-10 DIAGNOSIS — M6281 Muscle weakness (generalized): Secondary | ICD-10-CM

## 2023-01-10 DIAGNOSIS — R2689 Other abnormalities of gait and mobility: Secondary | ICD-10-CM

## 2023-01-12 ENCOUNTER — Ambulatory Visit: Payer: 59 | Admitting: Physical Therapy

## 2023-01-12 DIAGNOSIS — R2681 Unsteadiness on feet: Secondary | ICD-10-CM | POA: Diagnosis not present

## 2023-01-12 DIAGNOSIS — M6281 Muscle weakness (generalized): Secondary | ICD-10-CM

## 2023-01-12 DIAGNOSIS — R2689 Other abnormalities of gait and mobility: Secondary | ICD-10-CM

## 2023-01-12 NOTE — Therapy (Signed)
OUTPATIENT PHYSICAL THERAPY TREATMENT    Patient Name: [name] [name] MRN: [id number] DOB:Dec 20, 1955, 67 y.o., female Today's Date: 01/12/2023   PCP: Louis Matte, MD  REFERRING PROVIDER: Lonell Face, MD   END OF SESSION:  PT End of Session - 01/12/23 0850     Visit Number 18    Number of Visits 24    Date for PT Re-Evaluation 01/30/23    Authorization Type UHC; Medicaid of [place]    Progress Note Due on Visit 20    PT Start Time [phone]    PT Stop Time 0928    PT Time Calculation (min) 41 min    Equipment Utilized During Treatment Gait belt    Activity Tolerance Patient tolerated treatment well;No increased pain    Behavior During Therapy WFL for tasks assessed/performed                     Past Medical History:  Diagnosis Date   Actinic keratosis    Acute hepatitis C without mention of hepatic coma(070.51)    Arthritis    fingers   Basal cell carcinoma 12/13/2022   left upper abdomen, EDC   Carpal tunnel syndrome of left wrist    Depression    GERD (gastroesophageal reflux disease)    Hypothyroidism    Insomnia    Insomnia    Memory loss    Osteoporosis    Squamous cell carcinoma of skin 12/27/2021   Left pretibial - EDC   Tremor    Wears dentures    full upper, partial lower   Past Surgical History:  Procedure Laterality Date   ANKLE SURGERY Right    BLADDER TACT     CATARACT EXTRACTION W/PHACO Left 10/13/2021   Procedure: CATARACT EXTRACTION PHACO AND INTRAOCULAR LENS PLACEMENT (IOC) LEFT;  Surgeon: Lockie Mola, MD;  Location: [facility];  Service: Ophthalmology;  Laterality: Left;  3.97 00:49.0   CATARACT EXTRACTION W/PHACO Right 10/27/2021   Procedure: CATARACT EXTRACTION PHACO AND INTRAOCULAR LENS PLACEMENT (IOC) RIGHT 4.50 00:58.9;  Surgeon: Lockie Mola, MD;  Location: [facility];  Service: Ophthalmology;  Laterality: Right;   HAND SURGERY Right    TONSILLECTOMY     Patient Active Problem List    Diagnosis Date Noted   Hepatitis C 08/19/2016   Depression 08/19/2016   Insomnia 08/19/2016   Hypothyroidism 08/19/2016    ONSET DATE: 04/14/22  REFERRING DIAG: R26.9 (ICD-10-CM) - Abnormality of gait and mobility   THERAPY DIAG:  Unsteadiness on feet  Other abnormalities of gait and mobility  Muscle weakness (generalized)  Rationale for Evaluation and Treatment: Rehabilitation  SUBJECTIVE:  SUBJECTIVE STATEMENT:  Patient slightly disappointed because he was unable to get rheumatology appointment rheumatologist reports he is unable to see her due to her osteoporosis and physical therapist further questions regarding this the patient unable to clarify why she is unable to be seen due to osteoporosis.  Patient did ask for referral to her regular orthopedic doctor and will be seeing him at some point in the future for her knee pain.  PERTINENT HISTORY: Changes since last time: Pt reports since last time she was seen here for therapy she has lost her balance quite a few times. Pt reports she loses her balance about once week. 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 Pt reports her last fall pt was at home and lost her balance and fell backwards. Prior to that she fell outside when there was soft and uneven ground she was not able to accommodate for. Pt reports she lost the exercises she had been discharged with from therapy. In addition pt is having new onset of right knee pain and feels she may need to get it replaced as she got other knee replaced in the past. Pt is familiar to this clinic and has been treated here for similar problems in the past.  PAIN:  Are you having pain?  Yes, Right knee pain   PRECAUTIONS: Fall WEIGHT BEARING  RESTRICTIONS: No FALLS: Has patient fallen in last 6 months? Yes. Number of falls 10-12 (per patient)  PATIENT GOALS: Get up from floor better, get up from chairs better, improve balance, decrease falls.   OBJECTIVE:    TODAY'S TREATMENT:                                                                                                                              DATE: 01/12/23   TE  Unless otherwise stated, CGA was provided and gait belt donned in order to ensure pt safety  Ambulation with SBA and 2.5# AW  to medical arts waiting area and back, no reports of increased pain 2.5# Aw donned for the following  -Standing Heel Raises and standing toe raises  -3 x 12 reps with cues for eccentric control with 2.5# AW  -Marching without UE support, increased difficulty with LLE stance/ balancing.  -Sidestepping with RTB around ankles 2 sets of 10 reps ea side    NMR  Single-leg stance progression standing with left lower extremity on Airex and right lower extremity on step 3 x 45 seconds, with each rep increased difficulty with lateral head turns and superior and inferior head nods.  Brief seated rest and then patient completes Airex lateral step up and step down x 10 to each side and Airex anterior and posterior step up and sit down.  X 10 total  PATIENT EDUCATION: Education details: Importance of monitoring knee pain and following up with proper channels for increased pain including her positions.  Person educated: Patient Education method: Explanation Education comprehension: verbalized understanding  HOME EXERCISE PROGRAM:  Access Code: [email or web address] URL: [email or web address] Date: 10/06/2022 Prepared by: Thresa Ross Initiated and instructed in the following HEP   Exercises - Standing March with Counter Support  - 1 x daily - 7 x weekly - 2 sets - 10 reps - 2 sec  hold - standing in split stance with vertical head nods   - 1 x daily - 7 x weekly - 2 sets - 15  reps - standing in split stance with vertical head nods   - 1 x daily - 7 x weekly - 2 sets - 15 reps     GOALS: Goals reviewed with patient? Yes  SHORT TERM GOALS: Target date: 10/10/2022   Patient will be independent in home exercise program to improve strength/mobility for better functional independence with ADLs. Baseline: No HEP currently, lost HEP from last therapy 12/05/22: Pt sick with pneumonia and this is first visit back, was doing them prior to illness  Goal status: NOT MET   LONG TERM GOALS: Target date: 11/10/2022   1.  Patient (> 51 years old) will complete five times sit to stand test in < 15 seconds indicating an increased LE strength and improved balance. Baseline: 17.3 sec 12/05/22:13.4 sec Goal status: MET  2.  Patient will increase FOTO score to equal to or greater than  65   to demonstrate statistically significant improvement in mobility and quality of life.  Baseline: 59 4/22: 60 Goal status: ONGOING   3.  Patient will increase DGI Balance score by > 4 points to demonstrate decreased fall risk during functional activities. Baseline: 19 12/05/22: 20 Goal status: ONGOING  4.  Patient will increase six minute walk test distance to >1300 for progression to community ambulator and improve gait ability Baseline: 1180; 10/11/22: 1363ft no device, no LOB, 4/22: 600 feet, had to stop due to coughing from bronchial spasms  Goal status: ONGOING    ASSESSMENT: CLINICAL IMPRESSION:  Continued with current plan of care as laid out in evaluation and recent prior sessions. Continued with LE strengthening interventions for hip and ankle musculature. Pt progressed with toe raise interventions with resistance with good results but required cues for proper performance.  Patient continues to demonstrate increased difficulty with left lower extremity stability compared to right lower extremity will continue to be a target in future sessions as patient progresses.  Pt will continue  to benefit from skilled physical therapy intervention to address impairments, improve QOL, and attain therapy goals.         OBJECTIVE IMPAIRMENTS: Abnormal gait, decreased activity tolerance, decreased balance, decreased endurance, decreased mobility, and difficulty walking.   ACTIVITY LIMITATIONS: standing, squatting, stairs, and locomotion level PARTICIPATION LIMITATIONS: community activity and yard work PERSONAL FACTORS: Age and Past/current experiences are also affecting patient's functional outcome.  REHAB POTENTIAL: Good CLINICAL DECISION MAKING: Stable/uncomplicated EVALUATION COMPLEXITY: Low  PLAN:  PT FREQUENCY: 1-2x/week PT DURATION: 8 weeks PLANNED INTERVENTIONS: Therapeutic exercises, Therapeutic activity, Neuromuscular re-education, Balance training, Gait training, Patient/Family education, Self Care, Joint mobilization, Stair training, Vestibular training, Dry Needling, Moist heat, and Manual therapy  PLAN FOR NEXT SESSION:  Balance HEP, endurance as indicated, high level dynamic balance interventions     Norman Herrlich, PT 01/12/2023, 8:51 AM  8:51 AM, 01/12/23 Norman Herrlich PT ,DPT Physical Therapist- [place]  [facility]

## 2023-01-17 ENCOUNTER — Ambulatory Visit: Payer: 59 | Admitting: Physical Therapy

## 2023-01-19 ENCOUNTER — Ambulatory Visit: Payer: 59 | Attending: Neurology | Admitting: Physical Therapy

## 2023-01-19 DIAGNOSIS — R2689 Other abnormalities of gait and mobility: Secondary | ICD-10-CM | POA: Diagnosis present

## 2023-01-19 DIAGNOSIS — M6281 Muscle weakness (generalized): Secondary | ICD-10-CM | POA: Insufficient documentation

## 2023-01-19 DIAGNOSIS — R278 Other lack of coordination: Secondary | ICD-10-CM | POA: Diagnosis present

## 2023-01-19 DIAGNOSIS — R2681 Unsteadiness on feet: Secondary | ICD-10-CM | POA: Insufficient documentation

## 2023-01-19 NOTE — Therapy (Signed)
OUTPATIENT PHYSICAL THERAPY TREATMENT    Patient Name: COURNEY ROSENKRANTZ MRN: [id number] DOB:1955-12-14, 67 y.o., female Today's Date: 01/19/2023   PCP: Louis Matte, MD  REFERRING PROVIDER: Lonell Face, MD   END OF SESSION:  PT End of Session - 01/19/23 0849     Visit Number 19    Number of Visits 24    Date for PT Re-Evaluation 01/30/23    Authorization Type UHC; Medicaid of [place]    Progress Note Due on Visit 20    PT Start Time 0845    PT Stop Time 0927    PT Time Calculation (min) 42 min    Equipment Utilized During Treatment Gait belt    Activity Tolerance Patient tolerated treatment well;No increased pain    Behavior During Therapy WFL for tasks assessed/performed                     Past Medical History:  Diagnosis Date   Actinic keratosis    Acute hepatitis C without mention of hepatic coma(070.51)    Arthritis    fingers   Basal cell carcinoma 12/13/2022   left upper abdomen, EDC   Carpal tunnel syndrome of left wrist    Depression    GERD (gastroesophageal reflux disease)    Hypothyroidism    Insomnia    Insomnia    Memory loss    Osteoporosis    Squamous cell carcinoma of skin 12/27/2021   Left pretibial - EDC   Tremor    Wears dentures    full upper, partial lower   Past Surgical History:  Procedure Laterality Date   ANKLE SURGERY Right    BLADDER TACT     CATARACT EXTRACTION W/PHACO Left 10/13/2021   Procedure: CATARACT EXTRACTION PHACO AND INTRAOCULAR LENS PLACEMENT (IOC) LEFT;  Surgeon: Lockie Mola, MD;  Location: [facility];  Service: Ophthalmology;  Laterality: Left;  3.97 00:49.0   CATARACT EXTRACTION W/PHACO Right 10/27/2021   Procedure: CATARACT EXTRACTION PHACO AND INTRAOCULAR LENS PLACEMENT (IOC) RIGHT 4.50 00:58.9;  Surgeon: Lockie Mola, MD;  Location: [facility];  Service: Ophthalmology;  Laterality: Right;   HAND SURGERY Right    TONSILLECTOMY     Patient Active Problem List    Diagnosis Date Noted   Hepatitis C 08/19/2016   Depression 08/19/2016   Insomnia 08/19/2016   Hypothyroidism 08/19/2016    ONSET DATE: 04/14/22  REFERRING DIAG: R26.9 (ICD-10-CM) - Abnormality of gait and mobility   THERAPY DIAG:  Unsteadiness on feet  Other abnormalities of gait and mobility  Muscle weakness (generalized)  Rationale for Evaluation and Treatment: Rehabilitation  SUBJECTIVE:  SUBJECTIVE STATEMENT:  Pt reports no falls or LOB since last visit. Had a growth removed on her left flank which has been effecting her sleep but it has been getting better.   PERTINENT HISTORY: Changes since last time: Pt reports since last time she was seen here for therapy she has lost her balance quite a few times. Pt reports she loses her balance about once week. 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 Pt reports her last fall pt was at home and lost her balance and fell backwards. Prior to that she fell outside when there was soft and uneven ground she was not able to accommodate for. Pt reports she lost the exercises she had been discharged with from therapy. In addition pt is having new onset of right knee pain and feels she may need to get it replaced as she got other knee replaced in the past. Pt is familiar to this clinic and has been treated here for similar problems in the past.  PAIN:  Are you having pain?  Yes, Right knee pain   PRECAUTIONS: Fall WEIGHT BEARING RESTRICTIONS: No FALLS: Has patient fallen in last 6 months? Yes. Number of falls 10-12 (per patient)  PATIENT GOALS: Get up from floor better, get up from chairs better, improve balance, decrease falls.   OBJECTIVE:    TODAY'S TREATMENT:                                                                                                                               DATE: 01/19/23  TE  Unless otherwise stated, CGA was provided and gait belt donned in order to ensure pt safety  Standing toe raises and heel raises x 15 per set with 2.5# AW donned x 3 sets  Interval training with the below Ambulation x 170 ft with SBA and 2.5# AW  to medical arts waiting area and back, no reports of increased pain  -Marching without UE support, increased difficulty with LLE stance/ balancing.   Doffed 2.5# AW -Sidestepping with RTB around ankles 2 sets of 10 reps ea side   NMR  3/4 romberg stance 2 x 30 sec holds ea LE  -added head turns laterally on set 2, significant increase in sway   Brief seated rest  Airex lateral step up and step down 2 x 10 to each side and Airex anterior step up and down  and then posterior step up and step down.  X 10 total  Kore balance trainer working on patients proprioception and standing balance on dynamic surface  Game type(s): tux racer with various courses focusing on forward, side and retro side stepping.   Reps: 2 Comments: minimal UE assistance, good forward and retro weight shifting on more challenging course    PATIENT EDUCATION: Education details: Importance of monitoring knee pain and following up with proper channels for increased pain including  her positions. Person educated: Patient Education method: Explanation Education comprehension: verbalized understanding  HOME EXERCISE PROGRAM:  Access Code: [email or web address] URL: [email or web address] Date: 10/06/2022 Prepared by: Thresa Ross Initiated and instructed in the following HEP   Exercises - Standing March with Counter Support  - 1 x daily - 7 x weekly - 2 sets - 10 reps - 2 sec  hold - standing in split stance with vertical head nods   - 1 x daily - 7 x weekly - 2 sets - 15 reps - standing in split stance with vertical head nods   - 1 x daily - 7 x weekly  - 2 sets - 15 reps     GOALS: Goals reviewed with patient? Yes  SHORT TERM GOALS: Target date: 10/10/2022   Patient will be independent in home exercise program to improve strength/mobility for better functional independence with ADLs. Baseline: No HEP currently, lost HEP from last therapy 12/05/22: Pt sick with pneumonia and this is first visit back, was doing them prior to illness  Goal status: NOT MET   LONG TERM GOALS: Target date: 11/10/2022   1.  Patient (> 41 years old) will complete five times sit to stand test in < 15 seconds indicating an increased LE strength and improved balance. Baseline: 17.3 sec 12/05/22:13.4 sec Goal status: MET  2.  Patient will increase FOTO score to equal to or greater than  65   to demonstrate statistically significant improvement in mobility and quality of life.  Baseline: 59 4/22: 60 Goal status: ONGOING   3.  Patient will increase DGI Balance score by > 4 points to demonstrate decreased fall risk during functional activities. Baseline: 19 12/05/22: 20 Goal status: ONGOING  4.  Patient will increase six minute walk test distance to >1300 for progression to community ambulator and improve gait ability Baseline: 1180; 10/11/22: 1348ft no device, no LOB, 4/22: 600 feet, had to stop due to coughing from bronchial spasms  Goal status: ONGOING    ASSESSMENT: CLINICAL IMPRESSION:  Continued with current plan of care as laid out in evaluation and recent prior sessions. Continued with LE strengthening interventions for hip and ankle musculature. Pt progressed with toe raise interventions with resistance with good results but required cues for proper performance. Pt performed balance training on KORE balance machine showing good weight shifting and balance reactions.  Pt will continue to benefit from skilled physical therapy intervention to address impairments, improve QOL, and attain therapy goals.   OBJECTIVE IMPAIRMENTS: Abnormal gait, decreased  activity tolerance, decreased balance, decreased endurance, decreased mobility, and difficulty walking.   ACTIVITY LIMITATIONS: standing, squatting, stairs, and locomotion level PARTICIPATION LIMITATIONS: community activity and yard work PERSONAL FACTORS: Age and Past/current experiences are also affecting patient's functional outcome.  REHAB POTENTIAL: Good CLINICAL DECISION MAKING: Stable/uncomplicated EVALUATION COMPLEXITY: Low  PLAN:  PT FREQUENCY: 1-2x/week PT DURATION: 8 weeks PLANNED INTERVENTIONS: Therapeutic exercises, Therapeutic activity, Neuromuscular re-education, Balance training, Gait training, Patient/Family education, Self Care, Joint mobilization, Stair training, Vestibular training, Dry Needling, Moist heat, and Manual therapy  PLAN FOR NEXT SESSION:   Balance HEP, endurance as indicated, high level dynamic balance interventions   Norman Herrlich, PT 01/19/2023, 8:50 AM  8:50 AM, 01/19/23 Norman Herrlich PT ,DPT Physical Therapist- [place]  [facility]

## 2023-01-23 ENCOUNTER — Ambulatory Visit: Payer: 59 | Admitting: Physical Therapy

## 2023-01-23 DIAGNOSIS — R2681 Unsteadiness on feet: Secondary | ICD-10-CM

## 2023-01-23 DIAGNOSIS — R2689 Other abnormalities of gait and mobility: Secondary | ICD-10-CM

## 2023-01-23 DIAGNOSIS — M6281 Muscle weakness (generalized): Secondary | ICD-10-CM

## 2023-01-23 NOTE — Therapy (Signed)
OUTPATIENT PHYSICAL THERAPY TREATMENT/ Physical Therapy Progress Note   Dates of reporting period  12/05/22   to   01/23/23     Patient Name: [name] [name] MRN: [id number] DOB:03-25-1956, 67 y.o., female Today's Date: 01/24/2023   PCP: Louis Matte, MD  REFERRING PROVIDER: Lonell Face, MD   END OF SESSION:  PT End of Session - 01/23/23 1623     Visit Number 20    Number of Visits 24    Date for PT Re-Evaluation 01/30/23    Authorization Type UHC; Medicaid of [place]    Progress Note Due on Visit 20    PT Start Time 1604    PT Stop Time 1643    PT Time Calculation (min) 39 min    Equipment Utilized During Treatment Gait belt    Activity Tolerance Patient tolerated treatment well;No increased pain    Behavior During Therapy WFL for tasks assessed/performed                      Past Medical History:  Diagnosis Date   Actinic keratosis    Acute hepatitis C without mention of hepatic coma(070.51)    Arthritis    fingers   Basal cell carcinoma 12/13/2022   left upper abdomen, EDC   Carpal tunnel syndrome of left wrist    Depression    GERD (gastroesophageal reflux disease)    Hypothyroidism    Insomnia    Insomnia    Memory loss    Osteoporosis    Squamous cell carcinoma of skin 12/27/2021   Left pretibial - EDC   Tremor    Wears dentures    full upper, partial lower   Past Surgical History:  Procedure Laterality Date   ANKLE SURGERY Right    BLADDER TACT     CATARACT EXTRACTION W/PHACO Left 10/13/2021   Procedure: CATARACT EXTRACTION PHACO AND INTRAOCULAR LENS PLACEMENT (IOC) LEFT;  Surgeon: Lockie Mola, MD;  Location: [facility];  Service: Ophthalmology;  Laterality: Left;  3.97 00:49.0   CATARACT EXTRACTION W/PHACO Right 10/27/2021   Procedure: CATARACT EXTRACTION PHACO AND INTRAOCULAR LENS PLACEMENT (IOC) RIGHT 4.50 00:58.9;  Surgeon: Lockie Mola, MD;  Location: [facility];  Service: Ophthalmology;   Laterality: Right;   HAND SURGERY Right    TONSILLECTOMY     Patient Active Problem List   Diagnosis Date Noted   Hepatitis C 08/19/2016   Depression 08/19/2016   Insomnia 08/19/2016   Hypothyroidism 08/19/2016    ONSET DATE: 04/14/22  REFERRING DIAG: R26.9 (ICD-10-CM) - Abnormality of gait and mobility   THERAPY DIAG:  Unsteadiness on feet  Other abnormalities of gait and mobility  Muscle weakness (generalized)  Rationale for Evaluation and Treatment: Rehabilitation  SUBJECTIVE:  SUBJECTIVE STATEMENT:  Pt reports no falls or LOB since last visit. Feels she has gotten a lot better with PT but still has room to improve.   PERTINENT HISTORY: Changes since last time: Pt reports since last time she was seen here for therapy she has lost her balance quite a few times. Pt reports she loses her balance about once week. 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 Pt reports her last fall pt was at home and lost her balance and fell backwards. Prior to that she fell outside when there was soft and uneven ground she was not able to accommodate for. Pt reports she lost the exercises she had been discharged with from therapy. In addition pt is having new onset of right knee pain and feels she may need to get it replaced as she got other knee replaced in the past. Pt is familiar to this clinic and has been treated here for similar problems in the past.  PAIN:  Are you having pain?  Yes, Right knee pain   PRECAUTIONS: Fall WEIGHT BEARING RESTRICTIONS: No FALLS: Has patient fallen in last 6 months? Yes. Number of falls 10-12 (per patient)  PATIENT GOALS: Get up from floor better, get up from chairs better, improve balance, decrease falls.    OBJECTIVE:    TODAY'S TREATMENT:                                                                                                                              DATE: 01/24/23  Physical therapy treatment session today consisted of completing assessment of goals and administration of testing as demonstrated and documented in flow sheet, treatment, and goals section of this note. Addition treatments may be found below.    TA  Assessment and instruction in floor to and from stand transfers. Pt initially utilizes heavy trunk and UE assistance with this transfer. Instruction in sequencing and LE use and pt has improved but inconsistent form with this activity. Will work on functional strengthening for this position in future sessions. X 12 reps with instruction during and between reps    [facility] PT Assessment - 01/24/23 0001       Dynamic Gait Index   Level Surface Normal    Change in Gait Speed Normal    Gait with Horizontal Head Turns Normal    Gait with Vertical Head Turns Mild Impairment    Gait and Pivot Turn Normal    Step Over Obstacle Normal    Step Around Obstacles Normal    Steps Mild Impairment    Total Score 22             PATIENT EDUCATION: Education details: Importance of monitoring knee pain and following up with proper channels for increased pain including her positions. Person educated: Patient Education method: Explanation Education comprehension: verbalized understanding  HOME EXERCISE PROGRAM:  Access Code: [email or web address]  URL: [email or web address] Date: 10/06/2022 Prepared by: Thresa Ross Initiated and instructed in the following HEP   Exercises - Standing March with Counter Support  - 1 x daily - 7 x weekly - 2 sets - 10 reps - 2 sec  hold - standing in split stance with vertical head nods   - 1 x daily - 7 x weekly - 2 sets - 15 reps - standing in split stance with vertical head nods   - 1 x daily - 7 x weekly - 2 sets - 15  reps     GOALS: Goals reviewed with patient? Yes  SHORT TERM GOALS: Target date: 10/10/2022   Patient will be independent in home exercise program to improve strength/mobility for better functional independence with ADLs. Baseline: No HEP currently, lost HEP from last therapy 12/05/22: Pt sick with pneumonia and this is first visit back, was doing them prior to illness 6/10: completing 3x/ week  Goal status: MET    LONG TERM GOALS: Target date: 11/10/2022   1.  Patient (> 40 years old) will complete five times sit to stand test in < 15 seconds indicating an increased LE strength and improved balance. Baseline: 17.3 sec 12/05/22:13.4 6/10: 12 sec   Goal status: MET  2.  Patient will increase FOTO score to equal to or greater than  65   to demonstrate statistically significant improvement in mobility and quality of life.  Baseline: 59 4/22: 60 6/10:57 Goal status: NOT MET    3.  Patient will increase DGI Balance score by > 4 points to demonstrate decreased fall risk during functional activities. Baseline: 19 12/05/22: 20 6/10: 22 Goal status: ONGOING  4.  Patient will increase six minute walk test distance to >1300 for progression to community ambulator and improve gait ability Baseline: 1180; 10/11/22: 1369ft no device, no LOB, 4/22: 600 feet, had to stop due to coughing from bronchial spasms 6/10:1365 ft Goal status: Met     ASSESSMENT: CLINICAL IMPRESSION:  Pt presents to PT for progress note this date. Pt shows progress toward all goals and meets and continues to maintain her progress with other goals. Pt still working toward balance goal and FOTO goals at this time and will benefit from more PT to continue to progress these. Pt will continue to benefit from skilled physical therapy intervention to address impairments, improve QOL, and attain therapy goals. Patient's condition has the potential to improve in response to therapy. Maximum improvement is yet to be obtained. The  anticipated improvement is attainable and reasonable in a generally predictable time.     OBJECTIVE IMPAIRMENTS: Abnormal gait, decreased activity tolerance, decreased balance, decreased endurance, decreased mobility, and difficulty walking.   ACTIVITY LIMITATIONS: standing, squatting, stairs, and locomotion level PARTICIPATION LIMITATIONS: community activity and yard work PERSONAL FACTORS: Age and Past/current experiences are also affecting patient's functional outcome.  REHAB POTENTIAL: Good CLINICAL DECISION MAKING: Stable/uncomplicated EVALUATION COMPLEXITY: Low  PLAN:  PT FREQUENCY: 1-2x/week PT DURATION: 8 weeks PLANNED INTERVENTIONS: Therapeutic exercises, Therapeutic activity, Neuromuscular re-education, Balance training, Gait training, Patient/Family education, Self Care, Joint mobilization, Stair training, Vestibular training, Dry Needling, Moist heat, and Manual therapy  PLAN FOR NEXT SESSION:   Balance HEP, endurance as indicated, high level dynamic balance interventions   Norman Herrlich, PT 01/24/2023, 9:38 AM  9:38 AM, 01/24/23 Norman Herrlich PT ,DPT Physical Therapist- [place]  [facility]

## 2023-01-24 ENCOUNTER — Ambulatory Visit: Payer: 59 | Admitting: Physical Therapy

## 2023-01-24 ENCOUNTER — Encounter: Payer: Self-pay | Admitting: Physical Therapy

## 2023-01-25 ENCOUNTER — Encounter: Payer: Self-pay | Admitting: Physical Therapy

## 2023-01-25 ENCOUNTER — Ambulatory Visit: Payer: 59 | Admitting: Obstetrics and Gynecology

## 2023-01-25 ENCOUNTER — Ambulatory Visit: Payer: 59 | Admitting: Physical Therapy

## 2023-01-25 DIAGNOSIS — M6281 Muscle weakness (generalized): Secondary | ICD-10-CM

## 2023-01-25 DIAGNOSIS — R2681 Unsteadiness on feet: Secondary | ICD-10-CM | POA: Diagnosis not present

## 2023-01-25 DIAGNOSIS — R278 Other lack of coordination: Secondary | ICD-10-CM

## 2023-01-25 DIAGNOSIS — R2689 Other abnormalities of gait and mobility: Secondary | ICD-10-CM

## 2023-01-25 NOTE — Therapy (Addendum)
OUTPATIENT PHYSICAL THERAPY TREATMENT        Patient Name: [name] [name] MRN: [id number] DOB:12-Oct-1955, 67 y.o., female Today's Date: 01/25/2023   PCP: Louis Matte, MD  REFERRING PROVIDER: Lonell Face, MD   END OF SESSION:  PT End of Session - 01/25/23 1443     Visit Number 21    Number of Visits 24    Date for PT Re-Evaluation 01/30/23    Authorization Type UHC; Medicaid of [place]    Progress Note Due on Visit 20    PT Start Time 1345    PT Stop Time 1428    PT Time Calculation (min) 43 min    Equipment Utilized During Treatment Gait belt    Activity Tolerance Patient tolerated treatment well;No increased pain    Behavior During Therapy WFL for tasks assessed/performed                       Past Medical History:  Diagnosis Date   Actinic keratosis    Acute hepatitis C without mention of hepatic coma(070.51)    Arthritis    fingers   Basal cell carcinoma 12/13/2022   left upper abdomen, EDC   Carpal tunnel syndrome of left wrist    Depression    GERD (gastroesophageal reflux disease)    Hypothyroidism    Insomnia    Insomnia    Memory loss    Osteoporosis    Squamous cell carcinoma of skin 12/27/2021   Left pretibial - EDC   Tremor    Wears dentures    full upper, partial lower   Past Surgical History:  Procedure Laterality Date   ANKLE SURGERY Right    BLADDER TACT     CATARACT EXTRACTION W/PHACO Left 10/13/2021   Procedure: CATARACT EXTRACTION PHACO AND INTRAOCULAR LENS PLACEMENT (IOC) LEFT;  Surgeon: Lockie Mola, MD;  Location: [facility];  Service: Ophthalmology;  Laterality: Left;  3.97 00:49.0   CATARACT EXTRACTION W/PHACO Right 10/27/2021   Procedure: CATARACT EXTRACTION PHACO AND INTRAOCULAR LENS PLACEMENT (IOC) RIGHT 4.50 00:58.9;  Surgeon: Lockie Mola, MD;  Location: [facility];  Service: Ophthalmology;  Laterality: Right;   HAND SURGERY Right    TONSILLECTOMY     Patient Active  Problem List   Diagnosis Date Noted   Hepatitis C 08/19/2016   Depression 08/19/2016   Insomnia 08/19/2016   Hypothyroidism 08/19/2016    ONSET DATE: 04/14/22  REFERRING DIAG: R26.9 (ICD-10-CM) - Abnormality of gait and mobility   THERAPY DIAG:  Unsteadiness on feet  Other abnormalities of gait and mobility  Muscle weakness (generalized)  Other lack of coordination  Rationale for Evaluation and Treatment: Rehabilitation  SUBJECTIVE:  SUBJECTIVE STATEMENT: Pt has been doing well since last session; no significant changes.  Pt reports no falls or LOB since last visit. Feels she has gotten a lot better with PT but still has room to improve.   PERTINENT HISTORY: Changes since last time: Pt reports since last time she was seen here for therapy she has lost her balance quite a few times. Pt reports she loses her balance about once week. 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 Pt reports her last fall pt was at home and lost her balance and fell backwards. Prior to that she fell outside when there was soft and uneven ground she was not able to accommodate for. Pt reports she lost the exercises she had been discharged with from therapy. In addition pt is having new onset of right knee pain and feels she may need to get it replaced as she got other knee replaced in the past. Pt is familiar to this clinic and has been treated here for similar problems in the past.  PAIN:  Are you having pain?  Yes, Right knee pain   PRECAUTIONS: Fall WEIGHT BEARING RESTRICTIONS: No FALLS: Has patient fallen in last 6 months? Yes. Number of falls 10-12 (per patient)  PATIENT GOALS: Get up from floor better, get up from chairs better, improve balance,  decrease falls.   OBJECTIVE:    TODAY'S TREATMENT:                                                                                                                              DATE: 01/25/23  NMR:  Semi tandem stance at balance bar 30 sec bouts each leg  Stand on airex pad alternating eyes open eyes closed,15 secs each, 3 rounds staying on airex pad  Airex pad stance with NBOS 2 sets of 30 sec  Stand on airex pad Marching 15 each side 2 sets, cues for increasing SLS time  Activity Description: Stair tapping, 2 pods anterior, 1 pod lateral with LE's Activity Setting:  The Blaze Pod Random setting was chosen to enhance cognitive processing and agility, providing an unpredictable environment to simulate real-world scenarios, and fostering quick reactions and adaptability.   Number of Pods:  3 Cycles/Sets:  2 sets on each leg Duration (Time or Hit Count): 20 taps   2 rounds of KoreBalance game; Tux Racer, focusing on minimizing use of UE; working of directional weight shifting.  Balance course, 6 laps total around Leg press machine using 3 objects, airex pad, step up, step over foam roller  TE: Stair taps with 2.5#AW x10 ea side 2 sets. Able to complete majority of reps without use of UE.  Knee ext with 5# AW x10 each side, 3 sec extension holds  PATIENT EDUCATION: Education details: Importance of monitoring knee pain and following up with proper channels for increased pain including her positions. Person educated:  Patient Education method: Explanation Education comprehension: verbalized understanding  HOME EXERCISE PROGRAM:  Access Code: [email or web address] URL: [email or web address] Date: 10/06/2022 Prepared by: Thresa Ross Initiated and instructed in the following HEP   Exercises - Standing March with Counter Support  - 1 x daily - 7 x weekly - 2 sets - 10 reps - 2 sec  hold - standing in split stance with vertical head nods   - 1 x daily - 7 x weekly - 2 sets -  15 reps - standing in split stance with vertical head nods   - 1 x daily - 7 x weekly - 2 sets - 15 reps     GOALS: Goals reviewed with patient? Yes  SHORT TERM GOALS: Target date: 10/10/2022   Patient will be independent in home exercise program to improve strength/mobility for better functional independence with ADLs. Baseline: No HEP currently, lost HEP from last therapy 12/05/22: Pt sick with pneumonia and this is first visit back, was doing them prior to illness 6/10: completing 3x/ week  Goal status: MET    LONG TERM GOALS: Target date: 11/10/2022   1.  Patient (> 35 years old) will complete five times sit to stand test in < 15 seconds indicating an increased LE strength and improved balance. Baseline: 17.3 sec 12/05/22:13.4 6/10: 12 sec   Goal status: MET  2.  Patient will increase FOTO score to equal to or greater than  65   to demonstrate statistically significant improvement in mobility and quality of life.  Baseline: 59 4/22: 60 6/10:57 Goal status: NOT MET    3.  Patient will increase DGI Balance score by > 4 points to demonstrate decreased fall risk during functional activities. Baseline: 19 12/05/22: 20 6/10: 22 Goal status: ONGOING  4.  Patient will increase six minute walk test distance to >1300 for progression to community ambulator and improve gait ability Baseline: 1180; 10/11/22: 1363ft no device, no LOB, 4/22: 600 feet, had to stop due to coughing from bronchial spasms 6/10:1365 ft Goal status: Met     ASSESSMENT: CLINICAL IMPRESSION:  Patient appeared motivated and ready for treatment on this day. Worked on further increasing strength and dynamic balance endurance in preparation for floor transfers in future sessions. Pt seems to be improving the overall evidenced by decreases in overall rest breaks required between exercises. Pt will continue to benefit from skilled physical therapy intervention to address impairments, improve QOL, and attain therapy goals.     OBJECTIVE IMPAIRMENTS: Abnormal gait, decreased activity tolerance, decreased balance, decreased endurance, decreased mobility, and difficulty walking.   ACTIVITY LIMITATIONS: standing, squatting, stairs, and locomotion level PARTICIPATION LIMITATIONS: community activity and yard work PERSONAL FACTORS: Age and Past/current experiences are also affecting patient's functional outcome.  REHAB POTENTIAL: Good CLINICAL DECISION MAKING: Stable/uncomplicated EVALUATION COMPLEXITY: Low  PLAN:  PT FREQUENCY: 1-2x/week PT DURATION: 8 weeks PLANNED INTERVENTIONS: Therapeutic exercises, Therapeutic activity, Neuromuscular re-education, Balance training, Gait training, Patient/Family education, Self Care, Joint mobilization, Stair training, Vestibular training, Dry Needling, Moist heat, and Manual therapy  PLAN FOR NEXT SESSION:   Balance HEP, endurance as indicated, high level dynamic balance interventions   Nani Gasser, Student-PT 01/25/2023, 3:27 PM  This entire session was performed under direct supervision and direction of a licensed therapist/therapist assistant . I have personally read, edited and approve of the note as written.    This licensed clinician was present and actively directing care throughout the session at all times.  Norman Herrlich PT ,DPT Physical Therapist- [facility]  [facility]

## 2023-01-26 ENCOUNTER — Ambulatory Visit: Payer: 59 | Admitting: Physical Therapy

## 2023-01-30 ENCOUNTER — Ambulatory Visit: Payer: 59 | Admitting: Physical Therapy

## 2023-01-30 NOTE — Therapy (Deleted)
OUTPATIENT PHYSICAL THERAPY TREATMENT        Patient Name: [name] [name] MRN: [id number] DOB:10-Jun-1956, 67 y.o., female Today's Date: 01/30/2023   PCP: Louis Matte, MD  REFERRING PROVIDER: Lonell Face, MD   END OF SESSION:              Past Medical History:  Diagnosis Date   Actinic keratosis    Acute hepatitis C without mention of hepatic coma(070.51)    Arthritis    fingers   Basal cell carcinoma 12/13/2022   left upper abdomen, [facility]   Carpal tunnel syndrome of left wrist    Depression    GERD (gastroesophageal reflux disease)    Hypothyroidism    Insomnia    Insomnia    Memory loss    Osteoporosis    Squamous cell carcinoma of skin 12/27/2021   Left pretibial - [facility]   Tremor    Wears dentures    full upper, partial lower   Past Surgical History:  Procedure Laterality Date   ANKLE SURGERY Right    BLADDER TACT     CATARACT EXTRACTION W/PHACO Left 10/13/2021   Procedure: CATARACT EXTRACTION PHACO AND INTRAOCULAR LENS PLACEMENT (IOC) LEFT;  Surgeon: Lockie Mola, MD;  Location: [facility];  Service: Ophthalmology;  Laterality: Left;  3.97 00:49.0   CATARACT EXTRACTION W/PHACO Right 10/27/2021   Procedure: CATARACT EXTRACTION PHACO AND INTRAOCULAR LENS PLACEMENT (IOC) RIGHT 4.50 00:58.9;  Surgeon: Lockie Mola, MD;  Location: [facility];  Service: Ophthalmology;  Laterality: Right;   HAND SURGERY Right    TONSILLECTOMY     Patient Active Problem List   Diagnosis Date Noted   Hepatitis C 08/19/2016   Depression 08/19/2016   Insomnia 08/19/2016   Hypothyroidism 08/19/2016    ONSET DATE: 04/14/22  REFERRING DIAG: R26.9 (ICD-10-CM) - Abnormality of gait and mobility   THERAPY DIAG:  Unsteadiness on feet  Other abnormalities of gait and mobility  Muscle weakness (generalized)  Rationale for Evaluation and Treatment: Rehabilitation  SUBJECTIVE:                                                                                                                                                                                              SUBJECTIVE STATEMENT: Pt has been doing well since last session; no significant changes.  Pt reports no falls or LOB since last visit. Feels she has gotten a lot better with PT but still has room to improve.   PERTINENT HISTORY: Changes since last time: Pt reports since last time she was seen here for therapy she has lost her balance quite  a few times. Pt reports she loses her balance about once week. 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 Pt reports her last fall pt was at home and lost her balance and fell backwards. Prior to that she fell outside when there was soft and uneven ground she was not able to accommodate for. Pt reports she lost the exercises she had been discharged with from therapy. In addition pt is having new onset of right knee pain and feels she may need to get it replaced as she got other knee replaced in the past. Pt is familiar to this clinic and has been treated here for similar problems in the past.  PAIN:  Are you having pain?  Yes, Right knee pain   PRECAUTIONS: Fall WEIGHT BEARING RESTRICTIONS: No FALLS: Has patient fallen in last 6 months? Yes. Number of falls 10-12 (per patient)  PATIENT GOALS: Get up from floor better, get up from chairs better, improve balance, decrease falls.   OBJECTIVE:    TODAY'S TREATMENT:                                                                                                                              DATE: 01/30/23  NMR:  Semi tandem stance at balance bar 30 sec bouts each leg  Stand on airex pad alternating eyes open eyes closed,15 secs each, 3 rounds staying on airex pad  Airex pad stance with NBOS 2 sets of 30 sec  Stand on airex pad Marching 15 each side 2 sets, cues for increasing SLS time  Activity Description:  Stair tapping, 2 pods anterior, 1 pod lateral with LE's Activity Setting:  The Blaze Pod Random setting was chosen to enhance cognitive processing and agility, providing an unpredictable environment to simulate real-world scenarios, and fostering quick reactions and adaptability.   Number of Pods:  3 Cycles/Sets:  2 sets on each leg Duration (Time or Hit Count): 20 taps   2 rounds of KoreBalance game; Tux Racer, focusing on minimizing use of UE; working of directional weight shifting.  Balance course, 6 laps total around Leg press machine using 3 objects, airex pad, step up, step over foam roller  TE: Stair taps with 2.5#AW x10 ea side 2 sets. Able to complete majority of reps without use of UE.  Knee ext with 5# AW x10 each side, 3 sec extension holds  PATIENT EDUCATION: Education details: Importance of monitoring knee pain and following up with proper channels for increased pain including her positions. Person educated: Patient Education method: Explanation Education comprehension: verbalized understanding  HOME EXERCISE PROGRAM:  Access Code: [email or web address] URL: [email or web address] Date: 10/06/2022 Prepared by: Thresa Ross Initiated and instructed in the following HEP   Exercises - Standing March with Counter Support  - 1 x daily - 7 x weekly - 2 sets - 10 reps - 2 sec  hold - standing in  split stance with vertical head nods   - 1 x daily - 7 x weekly - 2 sets - 15 reps - standing in split stance with vertical head nods   - 1 x daily - 7 x weekly - 2 sets - 15 reps     GOALS: Goals reviewed with patient? Yes  SHORT TERM GOALS: Target date: 10/10/2022   Patient will be independent in home exercise program to improve strength/mobility for better functional independence with ADLs. Baseline: No HEP currently, lost HEP from last therapy 12/05/22: Pt sick with pneumonia and this is first visit back, was doing them prior to illness 6/10: completing 3x/ week  Goal  status: MET    LONG TERM GOALS: Target date: 11/10/2022   1.  Patient (> 30 years old) will complete five times sit to stand test in < 15 seconds indicating an increased LE strength and improved balance. Baseline: 17.3 sec 12/05/22:13.4 6/10: 12 sec   Goal status: MET  2.  Patient will increase FOTO score to equal to or greater than  65   to demonstrate statistically significant improvement in mobility and quality of life.  Baseline: 59 4/22: 60 6/10:57 Goal status: NOT MET    3.  Patient will increase DGI Balance score by > 4 points to demonstrate decreased fall risk during functional activities. Baseline: 19 12/05/22: 20 6/10: 22 Goal status: ONGOING  4.  Patient will increase six minute walk test distance to >1300 for progression to community ambulator and improve gait ability Baseline: 1180; 10/11/22: 134ft no device, no LOB, 4/22: 600 feet, had to stop due to coughing from bronchial spasms 6/10:1365 ft Goal status: Met     ASSESSMENT: CLINICAL IMPRESSION:  Patient appeared motivated and ready for treatment on this day. Worked on further increasing strength and dynamic balance endurance in preparation for floor transfers in future sessions. Pt seems to be improving the overall evidenced by decreases in overall rest breaks required between exercises. Pt will continue to benefit from skilled physical therapy intervention to address impairments, improve QOL, and attain therapy goals.    OBJECTIVE IMPAIRMENTS: Abnormal gait, decreased activity tolerance, decreased balance, decreased endurance, decreased mobility, and difficulty walking.   ACTIVITY LIMITATIONS: standing, squatting, stairs, and locomotion level PARTICIPATION LIMITATIONS: community activity and yard work PERSONAL FACTORS: Age and Past/current experiences are also affecting patient's functional outcome.  REHAB POTENTIAL: Good CLINICAL DECISION MAKING: Stable/uncomplicated EVALUATION COMPLEXITY: Low  PLAN:  PT  FREQUENCY: 1-2x/week PT DURATION: 8 weeks PLANNED INTERVENTIONS: Therapeutic exercises, Therapeutic activity, Neuromuscular re-education, Balance training, Gait training, Patient/Family education, Self Care, Joint mobilization, Stair training, Vestibular training, Dry Needling, Moist heat, and Manual therapy  PLAN FOR NEXT SESSION:   Balance HEP, endurance as indicated, high level dynamic balance interventions     Norman Herrlich PT ,DPT Physical Therapist- [place]  [facility]

## 2023-01-31 ENCOUNTER — Ambulatory Visit: Payer: 59 | Admitting: Physical Therapy

## 2023-01-31 ENCOUNTER — Encounter: Payer: Self-pay | Admitting: Physical Therapy

## 2023-01-31 DIAGNOSIS — R2681 Unsteadiness on feet: Secondary | ICD-10-CM | POA: Diagnosis not present

## 2023-01-31 DIAGNOSIS — M6281 Muscle weakness (generalized): Secondary | ICD-10-CM

## 2023-01-31 DIAGNOSIS — R2689 Other abnormalities of gait and mobility: Secondary | ICD-10-CM

## 2023-01-31 NOTE — Therapy (Signed)
OUTPATIENT PHYSICAL THERAPY TREATMENT/ RECERT         Patient Name: [name] [name] MRN: [id number] DOB:Oct 11, 1955, 67 y.o., female Today's Date: 01/31/2023   PCP: Louis Matte, MD  REFERRING PROVIDER: Lonell Face, MD   END OF SESSION:  PT End of Session - 01/31/23 1055     Visit Number 22    Number of Visits 24    Date for PT Re-Evaluation 02/28/23    Authorization Type UHC; Medicaid of [place]    Progress Note Due on Visit 30    PT Start Time 1100    PT Stop Time 1143    PT Time Calculation (min) 43 min    Equipment Utilized During Treatment Gait belt    Activity Tolerance Patient tolerated treatment well;No increased pain    Behavior During Therapy WFL for tasks assessed/performed                        Past Medical History:  Diagnosis Date   Actinic keratosis    Acute hepatitis C without mention of hepatic coma(070.51)    Arthritis    fingers   Basal cell carcinoma 12/13/2022   left upper abdomen, [facility]   Carpal tunnel syndrome of left wrist    Depression    GERD (gastroesophageal reflux disease)    Hypothyroidism    Insomnia    Insomnia    Memory loss    Osteoporosis    Squamous cell carcinoma of skin 12/27/2021   Left pretibial - ED[facility]   Tremor    Wears dentures    full upper, partial lower   Past Surgical History:  Procedure Laterality Date   ANKLE SURGERY Right    BLADDER TACT     CATARACT EXTRACTION W/PHACO Left 10/13/2021   Procedure: CATARACT EXTRACTION PHACO AND INTRAOCULAR LENS PLACEMENT (IOC) LEFT;  Surgeon: Lockie Mola, MD;  Location: [facility];  Service: Ophthalmology;  Laterality: Left;  3.97 00:49.0   CATARACT EXTRACTION W/PHACO Right 10/27/2021   Procedure: CATARACT EXTRACTION PHACO AND INTRAOCULAR LENS PLACEMENT (IOC) RIGHT 4.50 00:58.9;  Surgeon: Lockie Mola, MD;  Location: [facility];  Service: Ophthalmology;  Laterality: Right;   HAND SURGERY Right    TONSILLECTOMY      Patient Active Problem List   Diagnosis Date Noted   Hepatitis C 08/19/2016   Depression 08/19/2016   Insomnia 08/19/2016   Hypothyroidism 08/19/2016    ONSET DATE: 04/14/22  REFERRING DIAG: R26.9 (ICD-10-CM) - Abnormality of gait and mobility   THERAPY DIAG:  Unsteadiness on feet  Other abnormalities of gait and mobility  Muscle weakness (generalized)  Rationale for Evaluation and Treatment: Rehabilitation  SUBJECTIVE:  SUBJECTIVE STATEMENT: Pt reports doing well today. Pt denies any recent falls/stumbles since prior session. Pt denies any updates to medications or medical appointment since prior session. Pt reports good compliance with HEP when time permits.    PERTINENT HISTORY: Changes since last time: Pt reports since last time she was seen here for therapy she has lost her balance quite a few times. Pt reports she loses her balance about once week. 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 Pt reports her last fall pt was at home and lost her balance and fell backwards. Prior to that she fell outside when there was soft and uneven ground she was not able to accommodate for. Pt reports she lost the exercises she had been discharged with from therapy. In addition pt is having new onset of right knee pain and feels she may need to get it replaced as she got other knee replaced in the past. Pt is familiar to this clinic and has been treated here for similar problems in the past.  PAIN:  Are you having pain?  Yes, Right knee pain   PRECAUTIONS: Fall WEIGHT BEARING RESTRICTIONS: No FALLS: Has patient fallen in last 6 months? Yes. Number of falls 10-12 (per patient)  PATIENT GOALS: Get up from floor better, get up from chairs better,  improve balance, decrease falls.   OBJECTIVE:    TODAY'S TREATMENT:                                                                                                                              DATE: 01/31/23  NMR:   Stand on airex pad alternating eyes open eyes closed,15 secs each, 3 rounds staying on airex pad  Airex pad stance with NBOS 3 sets  -1 set with upward gaze 3 x 10 sec holds - 1 set with reaching overhead and gazing upward - 1 set with upward gaze then with scanning ceiling  -all of the above to work on challenging task for patient of tripping branches overhead this past weekend and feeling unsteady  Stand on airex pad Marching 15 each side 2 sets, cues for increasing SLS time  6 rounds of KoreBalance game; Tux Racer, focusing on minimizing use of UE; working of directional weight shifting.  TE: Nustep level 2 x 6 min with UE support with cues for proper sequencing and set up assistance. Stair taps with 2.5#AW x 15 ea side 2 sets. Able to complete majority of reps without use of UE.  Knee ext with 5# AW x10 each side, 3 sec extension holds  PATIENT EDUCATION: Education details: Importance of monitoring knee pain and following up with proper channels for increased pain including her positions. Person educated: Patient Education method: Explanation Education comprehension: verbalized understanding  HOME EXERCISE PROGRAM:  Access Code: [email or web address] URL: [email or web address] Date: 10/06/2022 Prepared by: Thresa Ross Initiated and instructed in the following  HEP   Exercises - Standing March with Counter Support  - 1 x daily - 7 x weekly - 2 sets - 10 reps - 2 sec  hold - standing in split stance with vertical head nods   - 1 x daily - 7 x weekly - 2 sets - 15 reps - standing in split stance with vertical head nods   - 1 x daily - 7 x weekly - 2 sets - 15 reps     GOALS: Goals reviewed with patient? Yes  SHORT TERM GOALS: Target date:  10/10/2022   Patient will be independent in home exercise program to improve strength/mobility for better functional independence with ADLs. Baseline: No HEP currently, lost HEP from last therapy 12/05/22: Pt sick with pneumonia and this is first visit back, was doing them prior to illness 6/10: completing 3x/ week  Goal status: MET    LONG TERM GOALS: Target date: 11/10/2022   1.  Patient (> 73 years old) will complete five times sit to stand test in < 15 seconds indicating an increased LE strength and improved balance. Baseline: 17.3 sec 12/05/22:13.4 6/10: 12 sec   Goal status: MET  2.  Patient will increase FOTO score to equal to or greater than  65   to demonstrate statistically significant improvement in mobility and quality of life.  Baseline: 59 4/22: 60 6/10:57 Goal status: NOT MET    3.  Patient will increase DGI Balance score by > 4 points to demonstrate decreased fall risk during functional activities. Baseline: 19 12/05/22: 20 6/10: 22 Goal status: ONGOING  4.  Patient will increase six minute walk test distance to >1300 for progression to community ambulator and improve gait ability Baseline: 1180; 10/11/22: 1340ft no device, no LOB, 4/22: 600 feet, had to stop due to coughing from bronchial spasms 6/10:1365 ft Goal status: Met   5.  Pt will perform floor to stand transfer with proper sequencing and without LOB to indicate improved independence with functional transfers from this position at home.  Baseline: Pt requires chair or other support for transfer and shows difficulty with consistent sequencing for safety.  Goal status: INITIAL     ASSESSMENT: CLINICAL IMPRESSION:  Patient appeared motivated and ready for treatment on this day. Goals reassessed 2 visits prior and pt making good progress toward goals per progress note. Pt wants to add one goal of floor to stand transfer safely and efficiently for gardening and cleaning activities at home. This has been added to pt  goals. Pt session focussed on not putting excess stress on knee today as pt had cortisone injection yesterday. Will continue with more in depth floor to stand transfer training to build strength and sequencing with this activity. Patient's condition has the potential to improve in response to therapy. Maximum improvement is yet to be obtained. The anticipated improvement is attainable and reasonable in a generally predictable time.      OBJECTIVE IMPAIRMENTS: Abnormal gait, decreased activity tolerance, decreased balance, decreased endurance, decreased mobility, and difficulty walking.   ACTIVITY LIMITATIONS: standing, squatting, stairs, and locomotion level PARTICIPATION LIMITATIONS: community activity and yard work PERSONAL FACTORS: Age and Past/current experiences are also affecting patient's functional outcome.  REHAB POTENTIAL: Good CLINICAL DECISION MAKING: Stable/uncomplicated EVALUATION COMPLEXITY: Low  PLAN:  PT FREQUENCY: 1-2x/week PT DURATION: 4 weeks PLANNED INTERVENTIONS: Therapeutic exercises, Therapeutic activity, Neuromuscular re-education, Balance training, Gait training, Patient/Family education, Self Care, Joint mobilization, Stair training, Vestibular training, Dry Needling, Moist heat, and Manual therapy  PLAN  FOR NEXT SESSION:   Floor to stand transfers, functional higher level balance activities, monitor knee pain     Norman Herrlich PT ,DPT Physical Therapist- [place]  [facility]

## 2023-02-02 ENCOUNTER — Ambulatory Visit: Payer: 59 | Admitting: Physical Therapy

## 2023-02-02 ENCOUNTER — Encounter: Payer: Self-pay | Admitting: Physical Therapy

## 2023-02-02 DIAGNOSIS — M6281 Muscle weakness (generalized): Secondary | ICD-10-CM

## 2023-02-02 DIAGNOSIS — R2689 Other abnormalities of gait and mobility: Secondary | ICD-10-CM

## 2023-02-02 DIAGNOSIS — R278 Other lack of coordination: Secondary | ICD-10-CM

## 2023-02-02 DIAGNOSIS — R2681 Unsteadiness on feet: Secondary | ICD-10-CM

## 2023-02-02 NOTE — Therapy (Signed)
OUTPATIENT PHYSICAL THERAPY TREATMENT NOTE        Patient Name: [name] [name] MRN: [id number] DOB:[birth year], 67 y.o., female Today's Date: 02/02/2023   PCP: Louis Matte, MD  REFERRING PROVIDER: Lonell Face, MD   END OF SESSION:  PT End of Session - 02/02/23 1344     Visit Number 23    Number of Visits 30    Date for PT Re-Evaluation 02/28/23    Authorization Type UHC; Medicaid of [place]    Progress Note Due on Visit 30    PT Start Time 1300    PT Stop Time 1343    PT Time Calculation (min) 43 min    Equipment Utilized During Treatment Gait belt    Activity Tolerance Patient tolerated treatment well;No increased pain    Behavior During Therapy WFL for tasks assessed/performed                         Past Medical History:  Diagnosis Date   Actinic keratosis    Acute hepatitis C without mention of hepatic coma(070.51)    Arthritis    fingers   Basal cell carcinoma 12/13/2022   left upper abdomen, ED[facility]   Carpal tunnel syndrome of left wrist    Depression    GERD (gastroesophageal reflux disease)    Hypothyroidism    Insomnia    Insomnia    Memory loss    Osteoporosis    Squamous cell carcinoma of skin 12/27/2021   Left pretibial - ED[facility]   Tremor    Wears dentures    full upper, partial lower   Past Surgical History:  Procedure Laterality Date   ANKLE SURGERY Right    BLADDER TACT     CATARACT EXTRACTION W/PHACO Left 10/13/2021   Procedure: CATARACT EXTRACTION PHACO AND INTRAOCULAR LENS PLACEMENT (IOC) LEFT;  Surgeon: Lockie Mola, MD;  Location: [facility];  Service: Ophthalmology;  Laterality: Left;  3.97 00:49.0   CATARACT EXTRACTION W/PHACO Right 10/27/2021   Procedure: CATARACT EXTRACTION PHACO AND INTRAOCULAR LENS PLACEMENT (IOC) RIGHT 4.50 00:58.9;  Surgeon: Lockie Mola, MD;  Location: [facility];  Service: Ophthalmology;  Laterality: Right;   HAND SURGERY Right    TONSILLECTOMY     Patient  Active Problem List   Diagnosis Date Noted   Hepatitis C 08/19/2016   Depression 08/19/2016   Insomnia 08/19/2016   Hypothyroidism 08/19/2016    ONSET DATE: 04/14/22  REFERRING DIAG: R26.9 (ICD-10-CM) - Abnormality of gait and mobility   THERAPY DIAG:  Unsteadiness on feet  Other abnormalities of gait and mobility  Muscle weakness (generalized)  Other lack of coordination  Rationale for Evaluation and Treatment: Rehabilitation  SUBJECTIVE:  SUBJECTIVE STATEMENT: Pt reports doing well today. No significant changes since last session, states her knee is feeling much better today than last session.   PERTINENT HISTORY: Changes since last time: Pt reports since last time she was seen here for therapy she has lost her balance quite a few times. Pt reports she loses her balance about once week. 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 Pt reports her last fall pt was at home and lost her balance and fell backwards. Prior to that she fell outside when there was soft and uneven ground she was not able to accommodate for. Pt reports she lost the exercises she had been discharged with from therapy. In addition pt is having new onset of right knee pain and feels she may need to get it replaced as she got other knee replaced in the past. Pt is familiar to this clinic and has been treated here for similar problems in the past.  PAIN:  Are you having pain?  Yes, Right knee pain   PRECAUTIONS: Fall WEIGHT BEARING RESTRICTIONS: No FALLS: Has patient fallen in last 6 months? Yes. Number of falls 10-12 (per patient)  PATIENT GOALS: Get up from floor better, get up from chairs better, improve balance, decrease falls.   OBJECTIVE:    TODAY'S  TREATMENT:                                                                                                                              DATE: 02/02/23 TA: Working on floor transfers ~14 mins, demonstrating to pt efficient and safe movement, pt is able to replicate with some difficulty with use of UE on therapy mat table. She is having difficulty with unsupported floor to standing transfers, biggest portion is bringing foot up into half kneeling position, with limited hip flexion.   Moved into a modified all 4's position with UE on therapy mat table, working on weight shifting and bringing opposite leg up into a step. X 10 on each leg.  Followed up with a static lunge squat at balance bar x 6 ea side, added this exercise to pt's HEP to further progress portions of unsupported floor to stand transfer.   NMR:  Standing on airex pad: -eyes close x 30 sec -Upward gaze + horizontal head turns 2 x 1 min bout -one foot on pad, one foot on 6" inch step 2 x 30 sec ea side All exercises aboeve; cues were given for keeping stable BOS and minimizing use of UE.  Stand on airex pad Marching 15 each side 2 sets, cues for increasing SLS time, note pt having difficulty bilat., needing moderate use of hands to keep balance controlled.  3 rounds of KoreBalance game; Tux Racer, focusing on minimizing use of UE; working of directional weight shifting. Adjusted to have foot stance on 6 size, allowing for better posterior shifts while still  challenging pt in all directions.    PATIENT EDUCATION: Education details: Importance of monitoring knee pain and following up with proper channels for increased pain including her positions. Person educated: Patient Education method: Explanation Education comprehension: verbalized understanding  HOME EXERCISE PROGRAM:  Access Code: [email or web address] URL: [email or web address] Date: 10/06/2022 Prepared by: Thresa Ross Initiated and instructed in the following  HEP   Exercises - Standing March with Counter Support  - 1 x daily - 7 x weekly - 2 sets - 10 reps - 2 sec  hold - standing in split stance with vertical head nods   - 1 x daily - 7 x weekly - 2 sets - 15 reps - standing in split stance with vertical head nods   - 1 x daily - 7 x weekly - 2 sets - 15 reps     GOALS: Goals reviewed with patient? Yes  SHORT TERM GOALS: Target date: 10/10/2022   Patient will be independent in home exercise program to improve strength/mobility for better functional independence with ADLs. Baseline: No HEP currently, lost HEP from last therapy 12/05/22: Pt sick with pneumonia and this is first visit back, was doing them prior to illness 6/10: completing 3x/ week  Goal status: MET    LONG TERM GOALS: Target date: 11/10/2022   1.  Patient (> 34 years old) will complete five times sit to stand test in < 15 seconds indicating an increased LE strength and improved balance. Baseline: 17.3 sec 12/05/22:13.4 6/10: 12 sec   Goal status: MET  2.  Patient will increase FOTO score to equal to or greater than  65   to demonstrate statistically significant improvement in mobility and quality of life.  Baseline: 59 4/22: 60 6/10:57 Goal status: NOT MET    3.  Patient will increase DGI Balance score by > 4 points to demonstrate decreased fall risk during functional activities. Baseline: 19 12/05/22: 20 6/10: 22 Goal status: ONGOING  4.  Patient will increase six minute walk test distance to >1300 for progression to community ambulator and improve gait ability Baseline: 1180; 10/11/22: 1357ft no device, no LOB, 4/22: 600 feet, had to stop due to coughing from bronchial spasms 6/10:1365 ft Goal status: Met   5.  Pt will perform floor to stand transfer with proper sequencing and without LOB to indicate improved independence with functional transfers from this position at home.  Baseline: Pt requires chair or other support for transfer and shows difficulty with consistent  sequencing for safety.  Goal status: INITIAL     ASSESSMENT: CLINICAL IMPRESSION: Patient appeared motivated and ready for treatment on this day. Today's session began with floor transfer training per above. Responded well to the sequencing with an UE assist on therapy mat table, but has room for improvement with support removed, limited by ability to achieve half kneeling position; added to HEP exercise to address this. Worked on static and dynamic balance progressions for the remainder of the session. Pt will continue to benefit from skilled physical therapy intervention to address impairments, improve QOL, and attain therapy goals.    OBJECTIVE IMPAIRMENTS: Abnormal gait, decreased activity tolerance, decreased balance, decreased endurance, decreased mobility, and difficulty walking.   ACTIVITY LIMITATIONS: standing, squatting, stairs, and locomotion level PARTICIPATION LIMITATIONS: community activity and yard work PERSONAL FACTORS: Age and Past/current experiences are also affecting patient's functional outcome.  REHAB POTENTIAL: Good CLINICAL DECISION MAKING: Stable/uncomplicated EVALUATION COMPLEXITY: Low  PLAN:  PT FREQUENCY: 1-2x/week PT DURATION: 4 weeks PLANNED INTERVENTIONS: Therapeutic exercises, Therapeutic  activity, Neuromuscular re-education, Balance training, Gait training, Patient/Family education, Self Care, Joint mobilization, Stair training, Vestibular training, Dry Needling, Moist heat, and Manual therapy  PLAN FOR NEXT SESSION:   Floor to stand transfers, functional higher level balance activities, monitor knee pain   Cecile Sheerer, SPT This entire session was performed under direct supervision and direction of a licensed therapist/therapist assistant . I have personally read, edited and approve of the note as written.    This licensed clinician was present and actively directing care throughout the session at all times.  Norman Herrlich PT ,DPT Physical  Therapist- [place]  [facility]

## 2023-02-06 ENCOUNTER — Ambulatory Visit: Payer: 59 | Admitting: Physical Therapy

## 2023-02-07 ENCOUNTER — Ambulatory Visit: Payer: 59 | Admitting: Physical Therapy

## 2023-02-09 ENCOUNTER — Ambulatory Visit: Payer: 59

## 2023-02-09 DIAGNOSIS — R2681 Unsteadiness on feet: Secondary | ICD-10-CM | POA: Diagnosis not present

## 2023-02-09 DIAGNOSIS — M6281 Muscle weakness (generalized): Secondary | ICD-10-CM

## 2023-02-09 DIAGNOSIS — R278 Other lack of coordination: Secondary | ICD-10-CM

## 2023-02-09 DIAGNOSIS — R2689 Other abnormalities of gait and mobility: Secondary | ICD-10-CM

## 2023-02-09 NOTE — Therapy (Signed)
OUTPATIENT PHYSICAL THERAPY TREATMENT   Patient Name: [name] [name] MRN: [id number] DOB:09-10-1955, 67 y.o., female Today's Date: 02/09/2023   PCP: Louis Matte, MD  REFERRING PROVIDER: Lonell Face, MD   END OF SESSION:  PT End of Session - 02/09/23 1435     Visit Number 24    Number of Visits 30    Date for PT Re-Evaluation 02/28/23    Authorization Type UHC; Medicaid of [place]    Progress Note Due on Visit 30    PT Start Time 1335    PT Stop Time 1415    PT Time Calculation (min) 40 min    Equipment Utilized During Treatment Gait belt    Activity Tolerance Patient tolerated treatment well;No increased pain    Behavior During Therapy WFL for tasks assessed/performed              Past Medical History:  Diagnosis Date   Actinic keratosis    Acute hepatitis C without mention of hepatic coma(070.51)    Arthritis    fingers   Basal cell carcinoma 12/13/2022   left upper abdomen, ED[facility]   Carpal tunnel syndrome of left wrist    Depression    GERD (gastroesophageal reflux disease)    Hypothyroidism    Insomnia    Insomnia    Memory loss    Osteoporosis    Squamous cell carcinoma of skin 12/27/2021   Left pretibial - EDC   Tremor    Wears dentures    full upper, partial lower   Past Surgical History:  Procedure Laterality Date   ANKLE SURGERY Right    BLADDER TACT     CATARACT EXTRACTION W/PHACO Left 10/13/2021   Procedure: CATARACT EXTRACTION PHACO AND INTRAOCULAR LENS PLACEMENT (IOC) LEFT;  Surgeon: Lockie Mola, MD;  Location: [facility];  Service: Ophthalmology;  Laterality: Left;  3.97 00:49.0   CATARACT EXTRACTION W/PHACO Right 10/27/2021   Procedure: CATARACT EXTRACTION PHACO AND INTRAOCULAR LENS PLACEMENT (IOC) RIGHT 4.50 00:58.9;  Surgeon: Lockie Mola, MD;  Location: [facility];  Service: Ophthalmology;  Laterality: Right;   HAND SURGERY Right    TONSILLECTOMY     Patient Active Problem List   Diagnosis Date  Noted   Hepatitis C 08/19/2016   Depression 08/19/2016   Insomnia 08/19/2016   Hypothyroidism 08/19/2016    ONSET DATE: 04/14/22  REFERRING DIAG: R26.9 (ICD-10-CM) - Abnormality of gait and mobility   THERAPY DIAG:  Unsteadiness on feet  Other abnormalities of gait and mobility  Muscle weakness (generalized)  Other lack of coordination  Rationale for Evaluation and Treatment: Rehabilitation  SUBJECTIVE:  SUBJECTIVE STATEMENT:  Pt reports she is having a lot of trouble with her L shoulder and neck, noting that she has no cartilage and has a lot of arthritis.  Pt reporting 2/10 pain in the L shoulder/neck area.   PERTINENT HISTORY: Changes since last time: Pt reports since last time she was seen here for therapy she has lost her balance quite a few times. Pt reports she loses her balance about once week. 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 Pt reports her last fall pt was at home and lost her balance and fell backwards. Prior to that she fell outside when there was soft and uneven ground she was not able to accommodate for. Pt reports she lost the exercises she had been discharged with from therapy. In addition pt is having new onset of right knee pain and feels she may need to get it replaced as she got other knee replaced in the past. Pt is familiar to this clinic and has been treated here for similar problems in the past.  PAIN:  Are you having pain?  Yes, 2/10 pain in the L shoulder  PRECAUTIONS: Fall WEIGHT BEARING RESTRICTIONS: No FALLS: Has patient fallen in last 6 months? Yes. Number of falls 10-12 (per patient)  PATIENT GOALS: Get up from floor better, get up from chairs better, improve balance, decrease falls.    OBJECTIVE:    TODAY'S TREATMENT: DATE: 02/09/23   NMR:   Static stance on Airex pad, 30 sec bouts Static stance on Airex pad, eyes closed 30 sec bouts Static NBOS on Airex pad, 30 sec bouts Static NBOS on Airex pad, eyes closed, 30 sec bouts Static staggered stance on Airex pad, 30 sec bouts each LE Static staggered stance on Airex pad, eyes closed, 30 sec bouts each LE Static staggered stand with head movements vertical/horizontal, x10 head turns in each direction  [employer], 3 total attempts in practice round, Bronze Set x1, Second Environmental manager, x2 attempts    TE:  Nustep level 2, 5 min with UE support with cues for proper sequencing and set up assistance. Stair taps with 2.5#AW x 15 ea side 2 sets. Able to complete majority of reps without use of UE. Seated LAQ's with 5# AW donned, 2x10 each side, 3 sec holds Seated marching with 5# AW donned, 2x10 each side   PATIENT EDUCATION: Education details: Importance of monitoring knee pain and following up with proper channels for increased pain including her positions. Person educated: Patient Education method: Explanation Education comprehension: verbalized understanding  HOME EXERCISE PROGRAM:  Access Code: [email or web address] URL: [email or web address] Date: 10/06/2022 Prepared by: Thresa Ross Initiated and instructed in the following HEP   Exercises - Standing March with Counter Support  - 1 x daily - 7 x weekly - 2 sets - 10 reps - 2 sec  hold - standing in split stance with vertical head nods   - 1 x daily - 7 x weekly - 2 sets - 15 reps - standing in split stance with vertical head nods   - 1 x daily - 7 x weekly - 2 sets - 15 reps     GOALS: Goals reviewed with patient? Yes  SHORT TERM GOALS: Target date: 10/10/2022   Patient will be independent in home exercise program to improve strength/mobility for better functional independence with ADLs. Baseline: No HEP currently, lost  HEP from last therapy 12/05/22: Pt  sick with pneumonia and this is first visit back, was doing them prior to illness 6/10: completing 3x/ week  Goal status: MET    LONG TERM GOALS: Target date: 11/10/2022   1.  Patient (> 94 years old) will complete five times sit to stand test in < 15 seconds indicating an increased LE strength and improved balance. Baseline: 17.3 sec 12/05/22:13.4 6/10: 12 sec   Goal status: MET  2.  Patient will increase FOTO score to equal to or greater than  65   to demonstrate statistically significant improvement in mobility and quality of life.  Baseline: 59 4/22: 60 6/10:57 Goal status: NOT MET    3.  Patient will increase DGI Balance score by > 4 points to demonstrate decreased fall risk during functional activities. Baseline: 19 12/05/22: 20 6/10: 22 Goal status: ONGOING  4.  Patient will increase six minute walk test distance to >1300 for progression to community ambulator and improve gait ability Baseline: 1180; 10/11/22: 1312ft no device, no LOB, 4/22: 600 feet, had to stop due to coughing from bronchial spasms 6/10:1365 ft Goal status: Met   5.  Pt will perform floor to stand transfer with proper sequencing and without LOB to indicate improved independence with functional transfers from this position at home.  Baseline: Pt requires chair or other support for transfer and shows difficulty with consistent sequencing for safety.  Goal status: INITIAL     ASSESSMENT: CLINICAL IMPRESSION:  Pt performed well with the tasks given and was able to progress with the balance related tasks during the session today.  Pt does still have some difficulty with performing items when eyes are closed.  Pt will continue to improve with strengthening exercises along with neuro based exercises in order to improve overall balance tolerance with upright activities.   Pt will continue to benefit from skilled therapy to address remaining deficits in order to improve overall QoL and  return to PLOF.       OBJECTIVE IMPAIRMENTS: Abnormal gait, decreased activity tolerance, decreased balance, decreased endurance, decreased mobility, and difficulty walking.   ACTIVITY LIMITATIONS: standing, squatting, stairs, and locomotion level PARTICIPATION LIMITATIONS: community activity and yard work PERSONAL FACTORS: Age and Past/current experiences are also affecting patient's functional outcome.  REHAB POTENTIAL: Good CLINICAL DECISION MAKING: Stable/uncomplicated EVALUATION COMPLEXITY: Low  PLAN:  PT FREQUENCY: 1-2x/week PT DURATION: 4 weeks PLANNED INTERVENTIONS: Therapeutic exercises, Therapeutic activity, Neuromuscular re-education, Balance training, Gait training, Patient/Family education, Self Care, Joint mobilization, Stair training, Vestibular training, Dry Needling, Moist heat, and Manual therapy  PLAN FOR NEXT SESSION:   Floor to stand transfers, functional higher level balance activities, monitor knee pain     Phineas Real PT ,DPT Physical Therapist- [place]  [facility]

## 2023-02-14 ENCOUNTER — Ambulatory Visit: Payer: 59 | Attending: Neurology | Admitting: Physical Therapy

## 2023-02-14 DIAGNOSIS — R2681 Unsteadiness on feet: Secondary | ICD-10-CM | POA: Insufficient documentation

## 2023-02-14 DIAGNOSIS — R278 Other lack of coordination: Secondary | ICD-10-CM | POA: Insufficient documentation

## 2023-02-14 DIAGNOSIS — R2689 Other abnormalities of gait and mobility: Secondary | ICD-10-CM | POA: Diagnosis present

## 2023-02-14 DIAGNOSIS — M6281 Muscle weakness (generalized): Secondary | ICD-10-CM | POA: Diagnosis present

## 2023-02-14 NOTE — Therapy (Signed)
OUTPATIENT PHYSICAL THERAPY TREATMENT   Patient Name: [name] [name] MRN: [id number] DOB:07-19-1956, 67 y.o., female Today's Date: 02/14/2023   PCP: Louis Matte, MD  REFERRING PROVIDER: Lonell Face, MD   END OF SESSION:  PT End of Session - 02/14/23 0853     Visit Number 25    Number of Visits 30    Date for PT Re-Evaluation 02/28/23    Authorization Type UHC; Medicaid of [place]    Progress Note Due on Visit 30    PT Start Time [phone]    PT Stop Time 0930    PT Time Calculation (min) 41 min    Equipment Utilized During Treatment Gait belt    Activity Tolerance Patient tolerated treatment well;No increased pain    Behavior During Therapy WFL for tasks assessed/performed              Past Medical History:  Diagnosis Date   Actinic keratosis    Acute hepatitis C without mention of hepatic coma(070.51)    Arthritis    fingers   Basal cell carcinoma 12/13/2022   left upper abdomen, ED[facility]   Carpal tunnel syndrome of left wrist    Depression    GERD (gastroesophageal reflux disease)    Hypothyroidism    Insomnia    Insomnia    Memory loss    Osteoporosis    Squamous cell carcinoma of skin 12/27/2021   Left pretibial - ED[facility]   Tremor    Wears dentures    full upper, partial lower   Past Surgical History:  Procedure Laterality Date   ANKLE SURGERY Right    BLADDER TACT     CATARACT EXTRACTION W/PHACO Left 10/13/2021   Procedure: CATARACT EXTRACTION PHACO AND INTRAOCULAR LENS PLACEMENT (IOC) LEFT;  Surgeon: Lockie Mola, MD;  Location: [facility];  Service: Ophthalmology;  Laterality: Left;  3.97 00:49.0   CATARACT EXTRACTION W/PHACO Right 10/27/2021   Procedure: CATARACT EXTRACTION PHACO AND INTRAOCULAR LENS PLACEMENT (IOC) RIGHT 4.50 00:58.9;  Surgeon: Lockie Mola, MD;  Location: [facility];  Service: Ophthalmology;  Laterality: Right;   HAND SURGERY Right    TONSILLECTOMY     Patient Active Problem List   Diagnosis Date  Noted   Hepatitis C 08/19/2016   Depression 08/19/2016   Insomnia 08/19/2016   Hypothyroidism 08/19/2016    ONSET DATE: 04/14/22  REFERRING DIAG: R26.9 (ICD-10-CM) - Abnormality of gait and mobility   THERAPY DIAG:  Unsteadiness on feet  Other abnormalities of gait and mobility  Muscle weakness (generalized)  Rationale for Evaluation and Treatment: Rehabilitation  SUBJECTIVE:  SUBJECTIVE STATEMENT:  Pt reports she is having a lot of trouble with her L shoulder and neck, noting that she has no cartilage and has a lot of arthritis.  Pt reporting 2/10 pain in the L shoulder/neck area.   PERTINENT HISTORY: Changes since last time: Pt reports since last time she was seen here for therapy she has lost her balance quite a few times. Pt reports she loses her balance about once week. 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 Pt reports her last fall pt was at home and lost her balance and fell backwards. Prior to that she fell outside when there was soft and uneven ground she was not able to accommodate for. Pt reports she lost the exercises she had been discharged with from therapy. In addition pt is having new onset of right knee pain and feels she may need to get it replaced as she got other knee replaced in the past. Pt is familiar to this clinic and has been treated here for similar problems in the past.  PAIN:  Are you having pain?  Yes, 2/10 pain in the L shoulder  PRECAUTIONS: Fall WEIGHT BEARING RESTRICTIONS: No FALLS: Has patient fallen in last 6 months? Yes. Number of falls 10-12 (per patient)  PATIENT GOALS: Get up from floor better, get up from chairs better, improve balance, decrease falls.   OBJECTIVE:    TODAY'S  TREATMENT: DATE: 02/14/23  TE: Nustep level 2 x 5 min  Floor to stand progression Lunges with 2 airex pads for cue for knee placement, unable to complete without some knee pain in right knee despite cues for limiting range as well for wide BOS when completing movement to prevent excessive knee flexion on the R  TRX lunges for floor to stand progression 2 x 5 ea LE  Seated LAQ's with 6# AW donned, 2x10 each side, 3 sec holds Seated marching with 6# AW donned, 2x10 each side HS curl seated x 15 ea LE GTB   PATIENT EDUCATION: Education details: Importance of monitoring knee pain and following up with proper channels for increased pain including her positions. Person educated: Patient Education method: Explanation Education comprehension: verbalized understanding  HOME EXERCISE PROGRAM:  Access Code: [email or web address] URL: [email or web address] Date: 10/06/2022 Prepared by: Thresa Ross Initiated and instructed in the following HEP   Exercises - Standing March with Counter Support  - 1 x daily - 7 x weekly - 2 sets - 10 reps - 2 sec  hold - standing in split stance with vertical head nods   - 1 x daily - 7 x weekly - 2 sets - 15 reps - standing in split stance with vertical head nods   - 1 x daily - 7 x weekly - 2 sets - 15 reps     GOALS: Goals reviewed with patient? Yes  SHORT TERM GOALS: Target date: 10/10/2022   Patient will be independent in home exercise program to improve strength/mobility for better functional independence with ADLs. Baseline: No HEP currently, lost HEP from last therapy 12/05/22: Pt sick with pneumonia and this is first visit back, was doing them prior to illness 6/10: completing 3x/ week  Goal status: MET    LONG TERM GOALS: Target date: 11/10/2022   1.  Patient (> 44 years old) will complete five times sit to stand test in < 15 seconds indicating an increased LE strength and improved balance. Baseline: 17.3 sec  12/05/22:13.4 6/10: 12 sec    Goal status: MET  2.  Patient will increase FOTO score to equal to or greater than  65   to demonstrate statistically significant improvement in mobility and quality of life.  Baseline: 59 4/22: 60 6/10:57 Goal status: NOT MET    3.  Patient will increase DGI Balance score by > 4 points to demonstrate decreased fall risk during functional activities. Baseline: 19 12/05/22: 20 6/10: 22 Goal status: ONGOING  4.  Patient will increase six minute walk test distance to >1300 for progression to community ambulator and improve gait ability Baseline: 1180; 10/11/22: 1367ft no device, no LOB, 4/22: 600 feet, had to stop due to coughing from bronchial spasms 6/10:1365 ft Goal status: Met   5.  Pt will perform floor to stand transfer with proper sequencing and without LOB to indicate improved independence with functional transfers from this position at home.  Baseline: Pt requires chair or other support for transfer and shows difficulty with consistent sequencing for safety.  Goal status: INITIAL     ASSESSMENT: CLINICAL IMPRESSION:  Pt continues to progress with activities for floor to stand transfers. Pt has poor ability to properly select stance that is stable despite cues. May consider showing bear crawl/ quadruped walk up style floor to stand transfer for safety and knee considerations. Pt will continue to benefit from skilled therapy to address remaining deficits in order to improve overall QoL and return to PLOF.       OBJECTIVE IMPAIRMENTS: Abnormal gait, decreased activity tolerance, decreased balance, decreased endurance, decreased mobility, and difficulty walking.   ACTIVITY LIMITATIONS: standing, squatting, stairs, and locomotion level PARTICIPATION LIMITATIONS: community activity and yard work PERSONAL FACTORS: Age and Past/current experiences are also affecting patient's functional outcome.  REHAB POTENTIAL: Good CLINICAL DECISION MAKING: Stable/uncomplicated EVALUATION  COMPLEXITY: Low  PLAN:  PT FREQUENCY: 1-2x/week PT DURATION: 4 weeks PLANNED INTERVENTIONS: Therapeutic exercises, Therapeutic activity, Neuromuscular re-education, Balance training, Gait training, Patient/Family education, Self Care, Joint mobilization, Stair training, Vestibular training, Dry Needling, Moist heat, and Manual therapy  PLAN FOR NEXT SESSION:   Floor to stand transfers, functional higher level balance activities, monitor knee pain    Norman Herrlich PT ,DPT Physical Therapist- [place]  [facility]

## 2023-02-21 ENCOUNTER — Ambulatory Visit: Payer: 59 | Admitting: Physical Therapy

## 2023-02-21 DIAGNOSIS — R2681 Unsteadiness on feet: Secondary | ICD-10-CM | POA: Diagnosis not present

## 2023-02-21 DIAGNOSIS — M6281 Muscle weakness (generalized): Secondary | ICD-10-CM

## 2023-02-21 DIAGNOSIS — R2689 Other abnormalities of gait and mobility: Secondary | ICD-10-CM

## 2023-02-21 NOTE — Therapy (Signed)
OUTPATIENT PHYSICAL THERAPY TREATMENT   Patient Name: [name] [name] MRN: [id number] DOB:[birth year], 67 y.o., female Today's Date: 02/14/2023   PCP: Louis Matte, MD  REFERRING PROVIDER: Lonell Face, MD   END OF SESSION:  PT End of Session - 02/14/23 0853     Visit Number 25    Number of Visits 30    Date for PT Re-Evaluation 02/28/23    Authorization Type UHC; Medicaid of [place]    Progress Note Due on Visit 30    PT Start Time [phone]    PT Stop Time 0930    PT Time Calculation (min) 41 min    Equipment Utilized During Treatment Gait belt    Activity Tolerance Patient tolerated treatment well;No increased pain    Behavior During Therapy WFL for tasks assessed/performed              Past Medical History:  Diagnosis Date   Actinic keratosis    Acute hepatitis C without mention of hepatic coma(070.51)    Arthritis    fingers   Basal cell carcinoma 12/13/2022   left upper abdomen, EDC   Carpal tunnel syndrome of left wrist    Depression    GERD (gastroesophageal reflux disease)    Hypothyroidism    Insomnia    Insomnia    Memory loss    Osteoporosis    Squamous cell carcinoma of skin 12/27/2021   Left pretibial - EDC   Tremor    Wears dentures    full upper, partial lower   Past Surgical History:  Procedure Laterality Date   ANKLE SURGERY Right    BLADDER TACT     CATARACT EXTRACTION W/PHACO Left 10/13/2021   Procedure: CATARACT EXTRACTION PHACO AND INTRAOCULAR LENS PLACEMENT (IOC) LEFT;  Surgeon: Lockie Mola, MD;  Location: [facility];  Service: Ophthalmology;  Laterality: Left;  3.97 00:49.0   CATARACT EXTRACTION W/PHACO Right 10/27/2021   Procedure: CATARACT EXTRACTION PHACO AND INTRAOCULAR LENS PLACEMENT (IOC) RIGHT 4.50 00:58.9;  Surgeon: Lockie Mola, MD;  Location: [facility];  Service: Ophthalmology;  Laterality: Right;   HAND SURGERY Right    TONSILLECTOMY     Patient Active Problem List   Diagnosis Date  Noted   Hepatitis C 08/19/2016   Depression 08/19/2016   Insomnia 08/19/2016   Hypothyroidism 08/19/2016    ONSET DATE: 04/14/22  REFERRING DIAG: R26.9 (ICD-10-CM) - Abnormality of gait and mobility   THERAPY DIAG:  Unsteadiness on feet  Other abnormalities of gait and mobility  Muscle weakness (generalized)  Rationale for Evaluation and Treatment: Rehabilitation  SUBJECTIVE:  SUBJECTIVE STATEMENT:  Pt reports doing well today. Pt denies any recent falls/stumbles since prior session. Pt denies any updates to medications or medical appointment since prior session. Pt reports good compliance with HEP when time permits.     PERTINENT HISTORY: Changes since last time: Pt reports since last time she was seen here for therapy she has lost her balance quite a few times. Pt reports she loses her balance about once week. 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 Pt reports her last fall pt was at home and lost her balance and fell backwards. Prior to that she fell outside when there was soft and uneven ground she was not able to accommodate for. Pt reports she lost the exercises she had been discharged with from therapy. In addition pt is having new onset of right knee pain and feels she may need to get it replaced as she got other knee replaced in the past. Pt is familiar to this clinic and has been treated here for similar problems in the past.  PAIN:  Are you having pain?  Yes, 2/10 pain in the L shoulder  PRECAUTIONS: Fall WEIGHT BEARING RESTRICTIONS: No FALLS: Has patient fallen in last 6 months? Yes. Number of falls 10-12 (per patient)  PATIENT GOALS: Get up from floor better, get up from chairs better, improve balance, decrease falls.    OBJECTIVE:    TODAY'S TREATMENT: DATE: 02/14/23  Unless otherwise stated, CGA was provided and gait belt donned in order to ensure pt safety   TE: Nustep level 2 x 5 min  5# AW donned   Standing HS curls 2 x 10  Seated LAQ 2 x 10   TA  Mini reverse lunges with UE support  X 10 with R LE post  X 10 with LLE post, cues for step length and to ensure both knees assisting with movement, pt tends to only bend post LLE and then forward flex trunk, improved with cues.   Mini lunge to 2 airex pads x 10 with R LE post, pain noted, then added 4 inch step and pain was eliminated and completed 10 on ea.  - cues for proper movement pattern to prevent excessive   NMR:   SLS progression 1 LE on airex 1 on step  x 1 min ea LE   Airex step taps x 10 ea LE   Airex stance with EC 3 x 30 sec   On airex with UE support, lateral cone taps 2 x 10 ea LE forward cone tpas on 6 in step 2 x 10 ea LE   PATIENT EDUCATION: Education details: Importance of monitoring knee pain and following up with proper channels for increased pain including her positions. Person educated: Patient Education method: Explanation Education comprehension: verbalized understanding  HOME EXERCISE PROGRAM:  Access Code: [email or web address] URL: [email or web address] Date: 10/06/2022 Prepared by: Thresa Ross Initiated and instructed in the following HEP   Exercises - Standing March with Counter Support  - 1 x daily - 7 x weekly - 2 sets - 10 reps - 2 sec  hold - standing in split stance with vertical head nods   - 1 x daily - 7 x weekly - 2 sets - 15 reps - standing in split stance with vertical head nods   - 1 x daily - 7 x weekly - 2 sets - 15 reps     GOALS: Goals reviewed with patient?  Yes  SHORT TERM GOALS: Target date: 10/10/2022   Patient will be independent in home exercise program to improve strength/mobility for better functional independence with ADLs. Baseline: No HEP currently, lost  HEP from last therapy 12/05/22: Pt sick with pneumonia and this is first visit back, was doing them prior to illness 6/10: completing 3x/ week  Goal status: MET    LONG TERM GOALS: Target date: 11/10/2022   1.  Patient (> 67 years old) will complete five times sit to stand test in < 15 seconds indicating an increased LE strength and improved balance. Baseline: 17.3 sec 12/05/22:13.4 6/10: 12 sec   Goal status: MET  2.  Patient will increase FOTO score to equal to or greater than  65   to demonstrate statistically significant improvement in mobility and quality of life.  Baseline: 59 4/22: 60 6/10:57 Goal status: NOT MET    3.  Patient will increase DGI Balance score by > 4 points to demonstrate decreased fall risk during functional activities. Baseline: 19 12/05/22: 20 6/10: 22 Goal status: ONGOING  4.  Patient will increase six minute walk test distance to >1300 for progression to community ambulator and improve gait ability Baseline: 1180; 10/11/22: 1361ft no device, no LOB, 4/22: 600 feet, had to stop due to coughing from bronchial spasms 6/10:1365 ft Goal status: Met   5.  Pt will perform floor to stand transfer with proper sequencing and without LOB to indicate improved independence with functional transfers from this position at home.  Baseline: Pt requires chair or other support for transfer and shows difficulty with consistent sequencing for safety.  Goal status: INITIAL     ASSESSMENT: CLINICAL IMPRESSION:  Pt continues to progress with activities for floor to stand transfers. Pt better tolerated progression of lunges for floor transfers but knee pain continues to be a limitation and consideration for further progression.  Pt shows good progress with balance interventions. Pt will continue to benefit from skilled therapy to address remaining deficits in order to improve overall QoL and return to PLOF.       OBJECTIVE IMPAIRMENTS: Abnormal gait, decreased activity tolerance,  decreased balance, decreased endurance, decreased mobility, and difficulty walking.   ACTIVITY LIMITATIONS: standing, squatting, stairs, and locomotion level PARTICIPATION LIMITATIONS: community activity and yard work PERSONAL FACTORS: Age and Past/current experiences are also affecting patient's functional outcome.  REHAB POTENTIAL: Good CLINICAL DECISION MAKING: Stable/uncomplicated EVALUATION COMPLEXITY: Low  PLAN:  PT FREQUENCY: 1-2x/week PT DURATION: 4 weeks PLANNED INTERVENTIONS: Therapeutic exercises, Therapeutic activity, Neuromuscular re-education, Balance training, Gait training, Patient/Family education, Self Care, Joint mobilization, Stair training, Vestibular training, Dry Needling, Moist heat, and Manual therapy  PLAN FOR NEXT SESSION:   Floor to stand transfers, functional higher level balance activities, monitor knee pain    Norman Herrlich PT ,DPT Physical Therapist- [place]  [facility]

## 2023-02-23 ENCOUNTER — Ambulatory Visit: Payer: 59 | Admitting: Physical Therapy

## 2023-02-23 DIAGNOSIS — R2689 Other abnormalities of gait and mobility: Secondary | ICD-10-CM

## 2023-02-23 DIAGNOSIS — M6281 Muscle weakness (generalized): Secondary | ICD-10-CM

## 2023-02-23 DIAGNOSIS — R2681 Unsteadiness on feet: Secondary | ICD-10-CM

## 2023-02-23 DIAGNOSIS — R278 Other lack of coordination: Secondary | ICD-10-CM

## 2023-02-23 NOTE — Therapy (Signed)
OUTPATIENT PHYSICAL THERAPY TREATMENT   Patient Name: [name] [name] MRN: [id number] DOB:06-08-1956, 67 y.o., female Today's Date: 02/24/2023   PCP: Louis Matte, MD  REFERRING PROVIDER: Lonell Face, MD   END OF SESSION:  PT End of Session - 02/23/23 1535     Visit Number 27    Number of Visits 30    Date for PT Re-Evaluation 02/28/23    Authorization Type UHC; Medicaid of [place]    Progress Note Due on Visit 30    PT Start Time 1532    PT Stop Time 1615    PT Time Calculation (min) 43 min    Equipment Utilized During Treatment Gait belt    Activity Tolerance Patient tolerated treatment well;No increased pain    Behavior During Therapy WFL for tasks assessed/performed              Past Medical History:  Diagnosis Date   Actinic keratosis    Acute hepatitis C without mention of hepatic coma(070.51)    Arthritis    fingers   Basal cell carcinoma 12/13/2022   left upper abdomen, EDC   Carpal tunnel syndrome of left wrist    Depression    GERD (gastroesophageal reflux disease)    Hypothyroidism    Insomnia    Insomnia    Memory loss    Osteoporosis    Squamous cell carcinoma of skin 12/27/2021   Left pretibial - EDC   Tremor    Wears dentures    full upper, partial lower   Past Surgical History:  Procedure Laterality Date   ANKLE SURGERY Right    BLADDER TACT     CATARACT EXTRACTION W/PHACO Left 10/13/2021   Procedure: CATARACT EXTRACTION PHACO AND INTRAOCULAR LENS PLACEMENT (IOC) LEFT;  Surgeon: Lockie Mola, MD;  Location: [facility];  Service: Ophthalmology;  Laterality: Left;  3.97 00:49.0   CATARACT EXTRACTION W/PHACO Right 10/27/2021   Procedure: CATARACT EXTRACTION PHACO AND INTRAOCULAR LENS PLACEMENT (IOC) RIGHT 4.50 00:58.9;  Surgeon: Lockie Mola, MD;  Location: [facility];  Service: Ophthalmology;  Laterality: Right;   HAND SURGERY Right    TONSILLECTOMY     Patient Active Problem List   Diagnosis Date  Noted   Hepatitis C 08/19/2016   Depression 08/19/2016   Insomnia 08/19/2016   Hypothyroidism 08/19/2016    ONSET DATE: 04/14/22  REFERRING DIAG: R26.9 (ICD-10-CM) - Abnormality of gait and mobility   THERAPY DIAG:  Unsteadiness on feet  Other abnormalities of gait and mobility  Muscle weakness (generalized)  Other lack of coordination  Rationale for Evaluation and Treatment: Rehabilitation  SUBJECTIVE:  SUBJECTIVE STATEMENT:  Pt reports doing well today. Pt states that she has some aches in the Knees and occasional pain in the neck and shoulder, but they are not bothering her at the start of PT session.     PERTINENT HISTORY: Changes since last time: Pt reports since last time she was seen here for therapy she has lost her balance quite a few times. Pt reports she loses her balance about once week. 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 Pt reports her last fall pt was at home and lost her balance and fell backwards. Prior to that she fell outside when there was soft and uneven ground she was not able to accommodate for. Pt reports she lost the exercises she had been discharged with from therapy. In addition pt is having new onset of right knee pain and feels she may need to get it replaced as she got other knee replaced in the past. Pt is familiar to this clinic and has been treated here for similar problems in the past.  PAIN:  Are you having pain?  Yes, 2/10 pain in the L shoulder  PRECAUTIONS: Fall WEIGHT BEARING RESTRICTIONS: No FALLS: Has patient fallen in last 6 months? Yes. Number of falls 10-12 (per patient)  PATIENT GOALS: Get up from floor better, get up from chairs better, improve balance, decrease falls.    OBJECTIVE:    TODAY'S TREATMENT: DATE: 02/24/23  Unless otherwise stated, CGA was provided and gait belt donned in order to ensure pt safety  Nustep level 2 x 5 min cues for full ROM and consistently SPM throughout training. Seated LAQ with 2 sec hold x 10  Seated hip abduction x 10 with 3 sec hold Seated reciprocal hip flexion x 10 Bil RTB  Seated HS curl x 10 RTB performed BLE   Tandem stance 10 sec hold bil  Standing on airex pad: eyes open/eyes closed 3 x 10 sec each  Pt reporting that she feels off and a little dizzy.   Orthostatic VS assessed  Sitting 126/83 HR 96 Standing 0 min  115/62 HR 96  Standing 1 min 106/73 HR 92  Sitting 112/76 HR 94  Sitting following seated therex: 123/88 HR 92   PT educated on risks of low BP and possible syncope. Offered to assist pt to urgent care or ED given orthostatic hypotension, but pt decline.     PATIENT EDUCATION: Education details: Importance of monitoring knee pain and following up with proper channels for increased pain including her positions. Person educated: Patient Education method: Explanation Education comprehension: verbalized understanding  HOME EXERCISE PROGRAM:  Access Code: [email or web address] URL: [email or web address] Date: 10/06/2022 Prepared by: Thresa Ross Initiated and instructed in the following HEP   Exercises - Standing March with Counter Support  - 1 x daily - 7 x weekly - 2 sets - 10 reps - 2 sec  hold - standing in split stance with vertical head nods   - 1 x daily - 7 x weekly - 2 sets - 15 reps - standing in split stance with vertical head nods   - 1 x daily - 7 x weekly - 2 sets - 15 reps     GOALS: Goals reviewed with patient? Yes  SHORT TERM GOALS: Target date: 10/10/2022   Patient will be independent in home exercise program to improve strength/mobility for better functional independence with ADLs. Baseline: No HEP currently, lost HEP  from last therapy 12/05/22: Pt sick with  pneumonia and this is first visit back, was doing them prior to illness 6/10: completing 3x/ week  Goal status: MET    LONG TERM GOALS: Target date: 11/10/2022   1.  Patient (> 53 years old) will complete five times sit to stand test in < 15 seconds indicating an increased LE strength and improved balance. Baseline: 17.3 sec 12/05/22:13.4 6/10: 12 sec   Goal status: MET  2.  Patient will increase FOTO score to equal to or greater than  65   to demonstrate statistically significant improvement in mobility and quality of life.  Baseline: 59 4/22: 60 6/10:57 Goal status: NOT MET    3.  Patient will increase DGI Balance score by > 4 points to demonstrate decreased fall risk during functional activities. Baseline: 19 12/05/22: 20 6/10: 22 Goal status: ONGOING  4.  Patient will increase six minute walk test distance to >1300 for progression to community ambulator and improve gait ability Baseline: 1180; 10/11/22: 1350ft no device, no LOB, 4/22: 600 feet, had to stop due to coughing from bronchial spasms 6/10:1365 ft Goal status: Met   5.  Pt will perform floor to stand transfer with proper sequencing and without LOB to indicate improved independence with functional transfers from this position at home.  Baseline: Pt requires chair or other support for transfer and shows difficulty with consistent sequencing for safety.  Goal status: INITIAL     ASSESSMENT: CLINICAL IMPRESSION:  PT treatment limited due to mild orthostatic hypotension on this day with s/s. PT treatment focused on BLE strength due to s/s in standing. Overall, pt reports that she is doing better physically, but just feel off on this day. Encouraged to be assessed at [facility] care or ED, but pt declined on this day.  Pt will continue to benefit from skilled therapy to address remaining deficits in order to improve overall QoL and return to PLOF.       OBJECTIVE IMPAIRMENTS: Abnormal gait, decreased activity tolerance, decreased  balance, decreased endurance, decreased mobility, and difficulty walking.   ACTIVITY LIMITATIONS: standing, squatting, stairs, and locomotion level PARTICIPATION LIMITATIONS: community activity and yard work PERSONAL FACTORS: Age and Past/current experiences are also affecting patient's functional outcome.  REHAB POTENTIAL: Good CLINICAL DECISION MAKING: Stable/uncomplicated EVALUATION COMPLEXITY: Low  PLAN:  PT FREQUENCY: 1-2x/week PT DURATION: 4 weeks PLANNED INTERVENTIONS: Therapeutic exercises, Therapeutic activity, Neuromuscular re-education, Balance training, Gait training, Patient/Family education, Self Care, Joint mobilization, Stair training, Vestibular training, Dry Needling, Moist heat, and Manual therapy   PLAN FOR NEXT SESSION:   Floor to stand transfers, functional higher level balance activities, monitor knee pain  Possible assessment for d/c or Re-cert.    Golden Pop PT ,DPT Physical Therapist- [place]  [facility]

## 2023-02-27 ENCOUNTER — Encounter: Payer: Self-pay | Admitting: Physical Therapy

## 2023-02-27 ENCOUNTER — Ambulatory Visit: Payer: 59 | Admitting: Physical Therapy

## 2023-02-27 DIAGNOSIS — R2689 Other abnormalities of gait and mobility: Secondary | ICD-10-CM

## 2023-02-27 DIAGNOSIS — R2681 Unsteadiness on feet: Secondary | ICD-10-CM | POA: Diagnosis not present

## 2023-02-27 DIAGNOSIS — M6281 Muscle weakness (generalized): Secondary | ICD-10-CM

## 2023-02-27 DIAGNOSIS — R278 Other lack of coordination: Secondary | ICD-10-CM

## 2023-02-27 NOTE — Therapy (Unsigned)
OUTPATIENT PHYSICAL THERAPY TREATMENT/ DISCHARGE THERAPY   Patient Name: [name] [name] MRN: [id number] DOB:Feb 14, 1956, 67 y.o., female Today's Date: 02/27/2023   PCP: Louis Matte, MD  REFERRING PROVIDER: Lonell Face, MD   END OF SESSION:  PT End of Session - 02/27/23 0745     Visit Number 28    Number of Visits 30    Date for PT Re-Evaluation 02/28/23    Authorization Type UHC; Medicaid of [place]    Progress Note Due on Visit 30    PT Start Time 1530    PT Stop Time 1555    PT Time Calculation (min) 25 min    Equipment Utilized During Treatment Gait belt    Activity Tolerance Patient tolerated treatment well;No increased pain    Behavior During Therapy WFL for tasks assessed/performed               Past Medical History:  Diagnosis Date   Actinic keratosis    Acute hepatitis C without mention of hepatic coma(070.51)    Arthritis    fingers   Basal cell carcinoma 12/13/2022   left upper abdomen, EDC   Carpal tunnel syndrome of left wrist    Depression    GERD (gastroesophageal reflux disease)    Hypothyroidism    Insomnia    Insomnia    Memory loss    Osteoporosis    Squamous cell carcinoma of skin 12/27/2021   Left pretibial - EDC   Tremor    Wears dentures    full upper, partial lower   Past Surgical History:  Procedure Laterality Date   ANKLE SURGERY Right    BLADDER TACT     CATARACT EXTRACTION W/PHACO Left 10/13/2021   Procedure: CATARACT EXTRACTION PHACO AND INTRAOCULAR LENS PLACEMENT (IOC) LEFT;  Surgeon: Lockie Mola, MD;  Location: [facility];  Service: Ophthalmology;  Laterality: Left;  3.97 00:49.0   CATARACT EXTRACTION W/PHACO Right 10/27/2021   Procedure: CATARACT EXTRACTION PHACO AND INTRAOCULAR LENS PLACEMENT (IOC) RIGHT 4.50 00:58.9;  Surgeon: Lockie Mola, MD;  Location: [facility];  Service: Ophthalmology;  Laterality: Right;   HAND SURGERY Right    TONSILLECTOMY     Patient Active Problem  List   Diagnosis Date Noted   Hepatitis C 08/19/2016   Depression 08/19/2016   Insomnia 08/19/2016   Hypothyroidism 08/19/2016    ONSET DATE: 04/14/22  REFERRING DIAG: R26.9 (ICD-10-CM) - Abnormality of gait and mobility   THERAPY DIAG:  Unsteadiness on feet  Other abnormalities of gait and mobility  Muscle weakness (generalized)  Other lack of coordination  Rationale for Evaluation and Treatment: Rehabilitation  SUBJECTIVE:  SUBJECTIVE STATEMENT:  Pt reports doing well today.     PERTINENT HISTORY: Changes since last time: Pt reports since last time she was seen here for therapy she has lost her balance quite a few times. Pt reports she loses her balance about once week. 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 Pt reports her last fall pt was at home and lost her balance and fell backwards. Prior to that she fell outside when there was soft and uneven ground she was not able to accommodate for. Pt reports she lost the exercises she had been discharged with from therapy. In addition pt is having new onset of right knee pain and feels she may need to get it replaced as she got other knee replaced in the past. Pt is familiar to this clinic and has been treated here for similar problems in the past.  PAIN:  Are you having pain?  Yes, 2/10 pain in the L shoulder  PRECAUTIONS: Fall WEIGHT BEARING RESTRICTIONS: No FALLS: Has patient fallen in last 6 months? Yes. Number of falls 10-12 (per patient)  PATIENT GOALS: Get up from floor better, get up from chairs better, improve balance, decrease falls.   OBJECTIVE:    TODAY'S TREATMENT: DATE: 02/27/2023 Unless otherwise stated, CGA was provided and gait belt donned in order to ensure  pt safety  Goals assessment preformed today in conjunction with patient discharge. See corresponding flowsheet, goals, and assessment sections. Additional comments can be found below.    [facility] PT Assessment - 02/27/23 0001       Dynamic Gait Index   Level Surface Normal    Change in Gait Speed Normal    Gait with Horizontal Head Turns Normal    Gait with Vertical Head Turns Normal    Gait and Pivot Turn Normal    Step Over Obstacle Normal    Step Around Obstacles Normal    Steps Mild Impairment    Total Score 23              PATIENT EDUCATION: Education details: Importance of monitoring knee pain and following up with proper channels for increased pain including her positions. Person educated: Patient Education method: Explanation Education comprehension: verbalized understanding  HOME EXERCISE PROGRAM:  Access Code: [email or web address] URL: [email or web address] Date: 10/06/2022 Prepared by: Thresa Ross Initiated and instructed in the following HEP   Exercises - Standing March with Counter Support  - 1 x daily - 7 x weekly - 2 sets - 10 reps - 2 sec  hold - standing in split stance with vertical head nods   - 1 x daily - 7 x weekly - 2 sets - 15 reps - standing in split stance with vertical head nods   - 1 x daily - 7 x weekly - 2 sets - 15 reps     GOALS: Goals reviewed with patient? Yes  SHORT TERM GOALS: Target date: 10/10/2022   Patient will be independent in home exercise program to improve strength/mobility for better functional independence with ADLs. Baseline: No HEP currently, lost HEP from last therapy 12/05/22: Pt sick with pneumonia and this is first visit back, was doing them prior to illness 6/10: completing 3x/ week  Goal status: MET    LONG TERM GOALS: Target date: 11/10/2022   1.  Patient (> 68 years old) will complete five times sit to stand test in < 15 seconds indicating an increased LE strength  and improved balance. Baseline:  17.3 sec 12/05/22:13.4 6/10: 12 sec   Goal status: MET  2.  Patient will increase FOTO score to equal to or greater than  65   to demonstrate statistically significant improvement in mobility and quality of life.  Baseline: 59 4/22: 60 6/10:57 7/15: 55 Goal status: NOT MET    3.  Patient will increase DGI Balance score by > 4 points to demonstrate decreased fall risk during functional activities. Baseline: 19 12/05/22: 20 6/10: 22 .7/15: 23 Goal status: MET  4.  Patient will increase six minute walk test distance to >1300 for progression to community ambulator and improve gait ability Baseline: 1180; 10/11/22: 1340ft no device, no LOB, 4/22: 600 feet, had to stop due to coughing from bronchial spasms 6/10:1365 ft Goal status: Met   5.  Pt will perform floor to stand transfer with proper sequencing and without LOB to indicate improved independence with functional transfers from this position at home.  Baseline: Pt requires chair or other support for transfer and shows difficulty with consistent sequencing for safety. 7/15: patient able to safely transition from floor to standing with use of UE support. Goal status: MET     ASSESSMENT: CLINICAL IMPRESSION: Physical therapy treatment session today consisted of completing assessment of goals. SPT and pt conversed throughout on pt's overall progress with therapy. She reports feeling better with overall safety outside of the clinic and is confident with her ability to perform any of her daily tasks. Pt appropriate for discharge on this day due to various goals met, pt agreeable with discharge recommendation. She is showing increased stability, balance, and LE strength appropriate for safe functional activities and community navigation AEB by DGI improvements and overall goal completion.     OBJECTIVE IMPAIRMENTS: Abnormal gait, decreased activity tolerance, decreased balance, decreased endurance, decreased mobility, and difficulty walking.    ACTIVITY LIMITATIONS: standing, squatting, stairs, and locomotion level PARTICIPATION LIMITATIONS: community activity and yard work PERSONAL FACTORS: Age and Past/current experiences are also affecting patient's functional outcome.  REHAB POTENTIAL: Good CLINICAL DECISION MAKING: Stable/uncomplicated EVALUATION COMPLEXITY: Low  PLAN:  PT FREQUENCY: 1-2x/week PT DURATION: 4 weeks PLANNED INTERVENTIONS: Therapeutic exercises, Therapeutic activity, Neuromuscular re-education, Balance training, Gait training, Patient/Family education, Self Care, Joint mobilization, Stair training, Vestibular training, Dry Needling, Moist heat, and Manual therapy   PLAN FOR NEXT SESSION:   D/C  Cecile Sheerer, SPT  This entire session was performed under direct supervision and direction of a licensed therapist/therapist assistant . I have personally read, edited and approve of the note as written.    This licensed clinician was present and actively directing care throughout the session at all times.  Norman Herrlich PT ,DPT Physical Therapist- [place]  [facility]

## 2023-02-28 ENCOUNTER — Encounter: Payer: Self-pay | Admitting: Physical Therapy

## 2023-02-28 ENCOUNTER — Ambulatory Visit: Payer: 59 | Admitting: Physical Therapy

## 2023-03-02 ENCOUNTER — Ambulatory Visit: Payer: 59 | Admitting: Physical Therapy

## 2023-03-06 ENCOUNTER — Ambulatory Visit (INDEPENDENT_AMBULATORY_CARE_PROVIDER_SITE_OTHER): Payer: 59 | Admitting: Dermatology

## 2023-03-06 VITALS — BP 117/75

## 2023-03-06 DIAGNOSIS — C44729 Squamous cell carcinoma of skin of left lower limb, including hip: Secondary | ICD-10-CM | POA: Diagnosis not present

## 2023-03-06 DIAGNOSIS — D485 Neoplasm of uncertain behavior of skin: Secondary | ICD-10-CM

## 2023-03-06 DIAGNOSIS — C4492 Squamous cell carcinoma of skin, unspecified: Secondary | ICD-10-CM

## 2023-03-06 HISTORY — DX: Squamous cell carcinoma of skin, unspecified: C44.92

## 2023-03-06 NOTE — Progress Notes (Signed)
   Follow-Up Visit   Subjective  [name] [name] is a 67 y.o. female who presents for the following: scaly spot at left lower leg that has been there for about 2 months. Became inflamed about 1 month ago and is sore to touch. Patient does have hx of BCC and SCC.    The following portions of the chart were reviewed this encounter and updated as appropriate: medications, allergies, medical history  Review of Systems:  No other skin or systemic complaints except as noted in HPI or Assessment and Plan.  Objective  Well appearing patient in no apparent distress; mood and affect are within normal limits.   A focused examination was performed of the following areas: Left lower leg  Relevant exam findings are noted in the Assessment and Plan.  left lower lateral leg 15 x 17 mm hyperkeratotic eroded scaly pink plaque with inflammatory areola         Assessment & Plan     Neoplasm of uncertain behavior of skin left lower lateral leg  Skin / nail biopsy Type of biopsy: tangential   Informed consent: discussed and consent obtained   Timeout: patient name, date of birth, surgical site, and procedure verified   Procedure prep:  Patient was prepped and draped in usual sterile fashion Prep type:  Isopropyl alcohol Anesthesia: the lesion was anesthetized in a standard fashion   Anesthetic:  1% lidocaine w/ epinephrine 1-100,000 buffered w/ 8.4% NaHCO3 Instrument used: flexible razor blade   Hemostasis achieved with: pressure and electrodesiccation   Outcome: patient tolerated procedure well   Post-procedure details: sterile dressing applied and wound care instructions given   Dressing type: bandage and petrolatum    Specimen 1 - Surgical pathology Differential Diagnosis: r/o SCC keratoacanthoma vs BCC  Check Margins: No 15 x 17 mm  Will plan EDC vs Mohs pending bx results.     Return for TBSE, as scheduled, Hx SCC, Hx BCC.  Anise Salvo, RMA, am acting as scribe for  Elie Goody, MD .   Documentation: I have reviewed the above documentation for accuracy and completeness, and I agree with the above.  Elie Goody, MD

## 2023-03-06 NOTE — Addendum Note (Signed)
Addended by: Elie Goody on: 03/06/2023 03:29 PM   Modules accepted: Level of Service

## 2023-03-06 NOTE — Patient Instructions (Signed)
Wound Care Instructions  Cleanse wound gently with soap and water once a day then pat dry with clean gauze. Apply a thin coat of Petrolatum (petroleum jelly, "Vaseline") over the wound (unless you have an allergy to this). We recommend that you use a new, sterile tube of Vaseline. Do not pick or remove scabs. Do not remove the yellow or white "healing tissue" from the base of the wound.  Cover the wound with fresh, clean, nonstick gauze and secure with paper tape. You may use Band-Aids in place of gauze and tape if the wound is small enough, but would recommend trimming much of the tape off as there is often too much. Sometimes Band-Aids can irritate the skin.  You should call the office for your biopsy report after 1 week if you have not already been contacted.  If you experience any problems, such as abnormal amounts of bleeding, swelling, significant bruising, significant pain, or evidence of infection, please call the office immediately.  FOR ADULT SURGERY PATIENTS: If you need something for pain relief you may take 1 extra strength Tylenol (acetaminophen) AND 2 Ibuprofen (200mg each) together every 4 hours as needed for pain. (do not take these if you are allergic to them or if you have a reason you should not take them.) Typically, you may only need pain medication for 1 to 3 days.   Due to recent changes in healthcare laws, you may see results of your pathology and/or laboratory studies on MyChart before the doctors have had a chance to review them. We understand that in some cases there may be results that are confusing or concerning to you. Please understand that not all results are received at the same time and often the doctors may need to interpret multiple results in order to provide you with the best plan of care or course of treatment. Therefore, we ask that you please give us 2 business days to thoroughly review all your results before contacting the office for clarification. Should we  see a critical lab result, you will be contacted sooner.   If You Need Anything After Your Visit  If you have any questions or concerns for your doctor, please call our main line at [phone] and press option 4 to reach your doctor's medical assistant. If no one answers, please leave a voicemail as directed and we will return your call as soon as possible. Messages left after 4 pm will be answered the following business day.   You may also send us a message via MyChart. We typically respond to MyChart messages within 1-2 business days.  For prescription refills, please ask your pharmacy to contact our office. Our fax number is [phone].  If you have an urgent issue when the clinic is closed that cannot wait until the next business day, you can page your doctor at the number below.    Please note that while we do our best to be available for urgent issues outside of office hours, we are not available 24/7.   If you have an urgent issue and are unable to reach us, you may choose to seek medical care at your doctor's office, retail clinic, urgent care center, or emergency room.  If you have a medical emergency, please immediately call 911 or go to the emergency department.  Pager Numbers  - Dr. Kowalski: [phone]  - Dr. Moye: [phone]  - Dr. Stewart: [phone]  In the event of inclement weather, please call our main line at [phone] for   an update on the status of any delays or closures.  Dermatology Medication Tips: Please keep the boxes that topical medications come in in order to help keep track of the instructions about where and how to use these. Pharmacies typically print the medication instructions only on the boxes and not directly on the medication tubes.   If your medication is too expensive, please contact our office at [phone] option 4 or send us a message through MyChart.   We are unable to tell what your co-pay for medications will be in advance  as this is different depending on your insurance coverage. However, we may be able to find a substitute medication at lower cost or fill out paperwork to get insurance to cover a needed medication.   If a prior authorization is required to get your medication covered by your insurance company, please allow us 1-2 business days to complete this process.  Drug prices often vary depending on where the prescription is filled and some pharmacies may offer cheaper prices.  The website www.goodrx.com contains coupons for medications through different pharmacies. The prices here do not account for what the cost may be with help from insurance (it may be cheaper with your insurance), but the website can give you the price if you did not use any insurance.  - You can print the associated coupon and take it with your prescription to the pharmacy.  - You may also stop by our office during regular business hours and pick up a GoodRx coupon card.  - If you need your prescription sent electronically to a different pharmacy, notify our office through [facility] MyChart or by phone at [phone] option 4.      

## 2023-03-07 ENCOUNTER — Ambulatory Visit: Payer: 59 | Admitting: Physical Therapy

## 2023-03-09 ENCOUNTER — Ambulatory Visit: Payer: 59 | Admitting: Physical Therapy

## 2023-03-14 ENCOUNTER — Ambulatory Visit: Payer: 59 | Admitting: Physical Therapy

## 2023-03-15 ENCOUNTER — Telehealth: Payer: Self-pay

## 2023-03-15 DIAGNOSIS — C44729 Squamous cell carcinoma of skin of left lower limb, including hip: Secondary | ICD-10-CM

## 2023-03-15 NOTE — Telephone Encounter (Signed)
Patient advised pathology showed SCC, will refer for Mohs. Patient prefers [facility] per Dr. Michaelle Copas recommendation. Referral sent. Butch Penny., RMA

## 2023-03-15 NOTE — Telephone Encounter (Signed)
-----   Message from [place] sent at 03/14/2023  5:29 PM EDT ----- Regarding: Mohs options Hi Annice Pih,  This lesion needs Mohs. Do you have a list of the places to which we refer? Do patients pick among that list?  Thank you, Dr Katrinka Blazing ----- Message ----- From: Interface, [facility] [place] [facility] Sent: 03/14/2023   3:18 PM EDT To: Elie Goody, MD

## 2023-03-16 ENCOUNTER — Ambulatory Visit: Payer: 59 | Admitting: Physical Therapy

## 2023-03-21 ENCOUNTER — Ambulatory Visit: Payer: 59 | Admitting: Physical Therapy

## 2023-03-23 ENCOUNTER — Ambulatory Visit: Payer: 59 | Admitting: Physical Therapy

## 2023-03-28 ENCOUNTER — Ambulatory Visit: Payer: 59 | Admitting: Physical Therapy

## 2023-03-30 ENCOUNTER — Ambulatory Visit: Payer: 59 | Admitting: Physical Therapy

## 2023-03-30 ENCOUNTER — Telehealth: Payer: Self-pay | Admitting: Pulmonary Disease

## 2023-03-30 NOTE — Telephone Encounter (Signed)
Called and spoke to patient.  She stated that she was under the impression that she needed a PFT prior to 10/1 appt. I do not see mentioning of PFT in last OV note nor order for it.  Dr. Jayme Cloud, please advise. Thanks

## 2023-03-30 NOTE — Telephone Encounter (Signed)
I have notified the patient. Nothing further needed. 

## 2023-03-30 NOTE — Telephone Encounter (Signed)
She was still having issues when I saw her last time was about to see GI.  I wanted to check her 1 more time to make sure she is well compensated before we order PFTs.  This will give me a better idea of what she is doing when she is compensated.

## 2023-03-30 NOTE — Telephone Encounter (Signed)
Pt. Called  to sched. F/u with Dr. Jayme Cloud but asking if she needs PFT test she is now sched. on Oct.1st to come back but last AVS didn't state about PFT and no order was placed please advise pt.

## 2023-04-04 ENCOUNTER — Ambulatory Visit: Payer: 59 | Admitting: Physical Therapy

## 2023-04-05 ENCOUNTER — Ambulatory Visit: Payer: 59 | Admitting: Physical Therapy

## 2023-04-06 ENCOUNTER — Ambulatory Visit: Payer: 59 | Admitting: Physical Therapy

## 2023-04-11 ENCOUNTER — Other Ambulatory Visit: Payer: Self-pay

## 2023-04-11 ENCOUNTER — Telehealth: Payer: Self-pay

## 2023-04-11 ENCOUNTER — Ambulatory Visit: Payer: 59 | Admitting: Physical Therapy

## 2023-04-11 ENCOUNTER — Ambulatory Visit: Payer: 59 | Admitting: Gastroenterology

## 2023-04-11 NOTE — Telephone Encounter (Signed)
Per pt went to [facility]  looking for AGI. Per  pt she was told that she had appt on 04-12-2023 at 1. Pt went home .  I left pt message on 04-06-2023 but pt states she didn't listen to it until today when she returned home. It was to late to come to her appt.

## 2023-04-12 ENCOUNTER — Ambulatory Visit: Payer: 59 | Admitting: Physical Therapy

## 2023-04-13 ENCOUNTER — Ambulatory Visit: Payer: 59 | Admitting: Physical Therapy

## 2023-04-18 ENCOUNTER — Ambulatory Visit: Payer: 59 | Admitting: Physical Therapy

## 2023-04-18 NOTE — Therapy (Deleted)
OUTPATIENT PHYSICAL THERAPY CERVICAL EVALUATION   Patient Name: [name] [name] MRN: [id number] DOB:[birth year], 67 y.o., female Today's Date: 04/19/2023  END OF SESSION:   Past Medical History:  Diagnosis Date   Actinic keratosis    Acute hepatitis C without mention of hepatic coma(070.51)    Arthritis    fingers   Basal cell carcinoma 12/13/2022   left upper abdomen, EDC   Carpal tunnel syndrome of left wrist    Depression    GERD (gastroesophageal reflux disease)    Hypothyroidism    Insomnia    Insomnia    Memory loss    Osteoporosis    SCC (squamous cell carcinoma) 03/06/2023   left lower lateral leg, refer for Mohs   Squamous cell carcinoma of skin 12/27/2021   Left pretibial - EDC   Tremor    Wears dentures    full upper, partial lower   Past Surgical History:  Procedure Laterality Date   ANKLE SURGERY Right    BLADDER TACT     CATARACT EXTRACTION W/PHACO Left 10/13/2021   Procedure: CATARACT EXTRACTION PHACO AND INTRAOCULAR LENS PLACEMENT (IOC) LEFT;  Surgeon: Lockie Mola, MD;  Location: [facility];  Service: Ophthalmology;  Laterality: Left;  3.97 00:49.0   CATARACT EXTRACTION W/PHACO Right 10/27/2021   Procedure: CATARACT EXTRACTION PHACO AND INTRAOCULAR LENS PLACEMENT (IOC) RIGHT 4.50 00:58.9;  Surgeon: Lockie Mola, MD;  Location: [facility];  Service: Ophthalmology;  Laterality: Right;   HAND SURGERY Right    TONSILLECTOMY     Patient Active Problem List   Diagnosis Date Noted   Hepatitis C 08/19/2016   Depression 08/19/2016   Insomnia 08/19/2016   Hypothyroidism 08/19/2016    PCP:  Louis Matte, MD       REFERRING PROVIDER:  Altamese Cabal, PA-C       REFERRING DIAG: M54.2 (ICD-10-CM) - Neck pain   THERAPY DIAG:  Unsteadiness on feet  Other abnormalities of gait and mobility  Muscle weakness (generalized)  Rationale for Evaluation and Treatment: Rehabilitation  ONSET DATE:  ***  SUBJECTIVE:                                                                                                                                                                                                         SUBJECTIVE STATEMENT: *** Hand dominance: {MISC; OT HAND DOMINANCE:[id number]}  PERTINENT HISTORY:  ***  PAIN:  Are you having pain? {OPRCPAIN:27236}  PRECAUTIONS: {Therapy precautions:24002}  RED FLAGS: {PT Red Flags:29287}     WEIGHT BEARING RESTRICTIONS: No  FALLS:  Has  patient fallen in last 6 months? {fallsyesno:27318}  LIVING ENVIRONMENT: Lives with: lives with their family Lives in: House/apartment Stairs: Yes: External: 2 steps; can reach both Has following equipment at home:  walking stick   OCCUPATION: Not Working   PLOF: Independent with basic ADLs  PATIENT GOALS: ***  NEXT MD VISIT: ***  OBJECTIVE:   DIAGNOSTIC FINDINGS:  ***  PATIENT SURVEYS:  NDI *** FOTO ***  COGNITION: Overall cognitive status: Within functional limits for tasks assessed  SENSATION: WFL  POSTURE: {posture:25561}  PALPATION: ***   CERVICAL ROM:   {AROM/PROM:27142} ROM A/PROM (deg) eval  Flexion   Extension   Right lateral flexion   Left lateral flexion   Right rotation   Left rotation    (Blank rows = not tested)  UPPER EXTREMITY ROM:  {AROM/PROM:27142} ROM Right eval Left eval  Shoulder flexion    Shoulder extension    Shoulder abduction    Shoulder adduction    Shoulder extension    Shoulder internal rotation    Shoulder external rotation    Elbow flexion    Elbow extension    Wrist flexion    Wrist extension    Wrist ulnar deviation    Wrist radial deviation    Wrist pronation    Wrist supination     (Blank rows = not tested)  UPPER EXTREMITY MMT:  MMT Right eval Left eval  Shoulder flexion    Shoulder extension    Shoulder abduction    Shoulder adduction    Shoulder extension    Shoulder internal rotation     Shoulder external rotation    Middle trapezius    Lower trapezius    Elbow flexion    Elbow extension    Wrist flexion    Wrist extension    Wrist ulnar deviation    Wrist radial deviation    Wrist pronation    Wrist supination    Grip strength     (Blank rows = not tested)  CERVICAL SPECIAL TESTS:  {Cervical special tests:25246}  FUNCTIONAL TESTS:  {Functional tests:24029}  TODAY'S TREATMENT:                                                                                                                              DATE: ***   PATIENT EDUCATION:  Education details: *** Person educated: {Person educated:25204} Education method: {Education Method:25205} Education comprehension: {Education Comprehension:25206}  HOME EXERCISE PROGRAM: ***  ASSESSMENT:  CLINICAL IMPRESSION: Patient is a *** y.o. *** who was seen today for physical therapy evaluation and treatment for ***.   OBJECTIVE IMPAIRMENTS: {opptimpairments:25111}.   ACTIVITY LIMITATIONS: {activitylimitations:27494}  PARTICIPATION LIMITATIONS: {participationrestrictions:25113}  PERSONAL FACTORS: {Personal factors:25162} are also affecting patient's functional outcome.   REHAB POTENTIAL: {rehabpotential:25112}  CLINICAL DECISION MAKING: {clinical decision making:25114}  EVALUATION COMPLEXITY: {Evaluation complexity:25115}   GOALS: Goals reviewed with patient? {yes/no:20286}  SHORT TERM GOALS: Target date: ***  *** Baseline:  Goal status: INITIAL  2.  *** Baseline:  Goal status: INITIAL  3.  *** Baseline:  Goal status: INITIAL  4.  *** Baseline:  Goal status: INITIAL  5.  *** Baseline:  Goal status: INITIAL  6.  *** Baseline:  Goal status: INITIAL  LONG TERM GOALS: Target date: ***  *** Baseline:  Goal status: INITIAL  2.  *** Baseline:  Goal status: INITIAL  3.  *** Baseline:  Goal status: INITIAL  4.  *** Baseline:  Goal status: INITIAL  5.  *** Baseline:  Goal  status: INITIAL  6.  *** Baseline:  Goal status: INITIAL   PLAN:  PT FREQUENCY: {rehab frequency:25116}  PT DURATION: {rehab duration:25117}  PLANNED INTERVENTIONS: {rehab planned interventions:25118::"Therapeutic exercises","Therapeutic activity","Neuromuscular re-education","Balance training","Gait training","Patient/Family education","Self Care","Joint mobilization"}  PLAN FOR NEXT SESSION: ***   Norman Herrlich, PT 04/19/2023, 2:40 PM

## 2023-04-19 ENCOUNTER — Ambulatory Visit: Payer: 59 | Admitting: Physical Therapy

## 2023-04-20 ENCOUNTER — Ambulatory Visit: Payer: 59 | Admitting: Physical Therapy

## 2023-04-21 ENCOUNTER — Other Ambulatory Visit: Payer: Self-pay | Admitting: Pulmonary Disease

## 2023-04-24 ENCOUNTER — Ambulatory Visit: Payer: 59 | Admitting: Physical Therapy

## 2023-04-25 ENCOUNTER — Ambulatory Visit: Payer: 59 | Admitting: Physical Therapy

## 2023-04-26 ENCOUNTER — Ambulatory Visit: Payer: 59 | Admitting: Physical Therapy

## 2023-04-26 ENCOUNTER — Ambulatory Visit: Payer: 59 | Attending: Otolaryngology | Admitting: Physical Therapy

## 2023-04-26 ENCOUNTER — Other Ambulatory Visit: Payer: Self-pay

## 2023-04-26 ENCOUNTER — Encounter: Payer: Self-pay | Admitting: Physical Therapy

## 2023-04-26 DIAGNOSIS — M6281 Muscle weakness (generalized): Secondary | ICD-10-CM | POA: Diagnosis present

## 2023-04-26 DIAGNOSIS — R278 Other lack of coordination: Secondary | ICD-10-CM | POA: Diagnosis present

## 2023-04-26 DIAGNOSIS — M542 Cervicalgia: Secondary | ICD-10-CM | POA: Diagnosis present

## 2023-04-26 DIAGNOSIS — R2681 Unsteadiness on feet: Secondary | ICD-10-CM | POA: Insufficient documentation

## 2023-04-26 DIAGNOSIS — R2689 Other abnormalities of gait and mobility: Secondary | ICD-10-CM | POA: Insufficient documentation

## 2023-04-26 DIAGNOSIS — M5459 Other low back pain: Secondary | ICD-10-CM | POA: Insufficient documentation

## 2023-04-26 NOTE — Therapy (Addendum)
OUTPATIENT PHYSICAL THERAPY CERVICAL EVALUATION   Patient Name: [name] [name] MRN: [id number] DOB:March 23, 1956, 67 y.o., female Today's Date: 04/26/2023  END OF SESSION:  PT End of Session - 04/26/23 1409     Visit Number 1    Number of Visits 16    Date for PT Re-Evaluation 06/22/23    PT Start Time 1409    PT Stop Time 1445    PT Time Calculation (min) 36 min    Behavior During Therapy [facility] for tasks assessed/performed             Past Medical History:  Diagnosis Date   Actinic keratosis    Acute hepatitis C without mention of hepatic coma(070.51)    Arthritis    fingers   Basal cell carcinoma 12/13/2022   left upper abdomen, EDC   Carpal tunnel syndrome of left wrist    Depression    GERD (gastroesophageal reflux disease)    Hypothyroidism    Insomnia    Insomnia    Memory loss    Osteoporosis    SCC (squamous cell carcinoma) 03/06/2023   left lower lateral leg, refer for Mohs   Squamous cell carcinoma of skin 12/27/2021   Left pretibial - EDC   Tremor    Wears dentures    full upper, partial lower   Past Surgical History:  Procedure Laterality Date   ANKLE SURGERY Right    BLADDER TACT     CATARACT EXTRACTION W/PHACO Left 10/13/2021   Procedure: CATARACT EXTRACTION PHACO AND INTRAOCULAR LENS PLACEMENT (IOC) LEFT;  Surgeon: Lockie Mola, MD;  Location: [facility];  Service: Ophthalmology;  Laterality: Left;  3.97 00:49.0   CATARACT EXTRACTION W/PHACO Right 10/27/2021   Procedure: CATARACT EXTRACTION PHACO AND INTRAOCULAR LENS PLACEMENT (IOC) RIGHT 4.50 00:58.9;  Surgeon: Lockie Mola, MD;  Location: [facility];  Service: Ophthalmology;  Laterality: Right;   HAND SURGERY Right    TONSILLECTOMY     Patient Active Problem List   Diagnosis Date Noted   Hepatitis C 08/19/2016   Depression 08/19/2016   Insomnia 08/19/2016   Hypothyroidism 08/19/2016    PCP:    Salena Saner, MD    REFERRING PROVIDER:   Altamese Cabal, PA-C    REFERRING DIAG:  Diagnosis  M54.2 (ICD-10-CM) - Neck pain    THERAPY DIAG:  No diagnosis found.  Rationale for Evaluation and Treatment: Rehabilitation  ONSET DATE: 5 months   SUBJECTIVE:  SUBJECTIVE STATEMENT: Reports increased neck, shoulder, and upper back pain over the last couple months. States that she has started to notice poping and clicking sound without pain with cervical rotation and flexion extension.  Hand dominance: Right  PERTINENT HISTORY:  From recent MD visit: "  The patient is diagnosed with left trapezius strain. 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 I think this is overuse irritation. The patient will be treated conservatively with a muscle relaxer for pain, spasm and rest. The patient will also be given an anti-inflammatory dose taper. The patient will be placed in physical therapy and will follow-up in 6 weeks for reevaluation."  Pt has had recent PT treatment to address BLE strength and balance disorder and was d/c'ed from PT services on 7/15. Since then pt reports increased back, knee and neck pain with increased soreness at the end of work day on [employer]. Pt has visited [facility], and no injection needed since injection given on 8/8 per ortho at this time and to continue to strengthen BLE.   PAIN:  Are you having pain? Yes: NPRS scale: 8/10 Pain location: mid/ lower back Pain description: hurts/sore  Aggravating factors: lifting Relieving factors: standing up straight   PRECAUTIONS: None and Other: wearing back brace for comfort   RED FLAGS: None     WEIGHT BEARING RESTRICTIONS: No  FALLS:  Has patient fallen in last 6 months? No  LIVING ENVIRONMENT: Lives with: lives alone Lives in:  House/apartment Stairs: Yes: External: 5 steps; on right going up Has following equipment at home: Walker - 2 wheeled and does not use   OCCUPATION: food set up and delivery   PLOF: Independent  PATIENT GOALS: get back stronger  NEXT MD VISIT: sept 19, [place] visit    OBJECTIVE:   DIAGNOSTIC FINDINGS:  No recent imaging.   PATIENT SURVEYS:  FOTO 60(goal 59)  COGNITION: Overall cognitive status: Within functional limits for tasks assessed  SENSATION: Light touch: Impaired  and intermittent N/T with carrying heavy objects   POSTURE: rounded shoulders, forward head, decreased lumbar lordosis, and increased thoracic kyphosis  PALPATION: Tenderness to palpation with multiple largre trigger points to middle, upper traps as well as thoracis and cervical paraspinals    CERVICAL ROM:   Active ROM A/PROM (deg) eval  Flexion 18  Extension 42  Right lateral flexion 20  Left lateral flexion 8  Right rotation 52  Left rotation 45   (Blank rows = not tested)  UPPER EXTREMITY ROM:  Active ROM Right eval Left eval  Shoulder flexion [facility]  Shoulder extension [facility]  Shoulder abduction [facility] WFL  Shoulder adduction [facility]  Shoulder extension    Shoulder internal rotation    Shoulder external rotation    Elbow flexion    Elbow extension    Wrist flexion    Wrist extension    Wrist ulnar deviation    Wrist radial deviation    Wrist pronation    Wrist supination     (Blank rows = not tested)  UPPER EXTREMITY MMT:  MMT Right eval Left eval  Shoulder flexion 4+ 4  Shoulder extension 4+ 4  Shoulder abduction 4+ 4+  Shoulder adduction 4+ 4+  Shoulder extension 4+ 4+  Shoulder internal rotation 4+ 4  Shoulder external rotation 4+ 4  Middle trapezius    Lower trapezius    Elbow flexion 4+ 4+  Elbow extension 4+ 4+  Wrist flexion    Wrist  extension    Wrist ulnar deviation    Wrist radial deviation    Wrist pronation    Wrist supination    Grip strength      (Blank rows = not tested)  CERVICAL SPECIAL TESTS:  Cranial cervical flexion test: Negative and Distraction test: Positive    TODAY'S TREATMENT:                                                                                                                              DATE: 04/26/2023    PATIENT EDUCATION:  Education details: POC Person educated: Patient Education method: Explanation Education comprehension: verbalized understanding  HOME EXERCISE PROGRAM: To be given at next session   ASSESSMENT:  CLINICAL IMPRESSION: Patient is a 67 y.o. Female who was seen today for physical therapy evaluation and treatment for neck pain and stiffness. Pt demonstrates reduced cervical rotation to the L and severe reduction of L side lateral flexion to 8 deg. Pt was also noted to have multiple significant trigger points in Bil paraspinals and UT R>L as well as crepitus with cervical rotation and flexion. Pt also reports baseline  LPB and knee pain, but both are being managed through orthopedic service   OBJECTIVE IMPAIRMENTS: decreased ROM, decreased strength, hypomobility, increased fascial restrictions, increased muscle spasms, impaired flexibility, improper body mechanics, postural dysfunction, and pain.   ACTIVITY LIMITATIONS: carrying, lifting, standing, squatting, sleeping, reach over head, and hygiene/grooming  PARTICIPATION LIMITATIONS: laundry, driving, shopping, community activity, and occupation  PERSONAL FACTORS: Age, Behavior pattern, Education, Fitness, Past/current experiences, and 1 comorbidity: LBP knee pain  are also affecting patient's functional outcome.   REHAB POTENTIAL: Good  CLINICAL DECISION MAKING: Stable/uncomplicated  EVALUATION COMPLEXITY: Moderate   GOALS: Goals reviewed with patient? Yes  SHORT TERM GOALS: Target date: 05/31/2023    Pt will be independent with HEP to address cervical stiffness/pain without assist from PT Baseline: to be given at session  2.   Goal status: INITIAL   LONG TERM GOALS: Target date: 06/22/2023    Pt will increased FOTO to >66 to indicate improved function and reduced impairment from cervical pain  Baseline: 60 Goal status: INITIAL  2.  Pt will improve NDI by at least 5 pts to demonstrate improved function with ADLs and reduced disability as result of neck pain.  Baseline: 7/50 Goal status: INITIAL  3.  Pt will improve cervical rotation to at least 55 deg to improve safety with driving Bil  Baseline: 45 L, 52 R  Goal status: INITIAL  4.  Pt will increase lateral flexion in Cspine by at least 15 deg on the L to improve safety and reduced pain with work tasks of moving and preparing food at [employer]  Baseline: L 8 deg Goal status: INITIAL  5.  Pt will report pain in neck 2/10 at worst to improve QoL and improve tolerance to work related tasks  Baseline: 6/10  Goal status: INITIAL  PLAN:  PT FREQUENCY: 1-2x/week  PT DURATION: 8 weeks  PLANNED INTERVENTIONS: Therapeutic exercises, Therapeutic activity, Neuromuscular re-education, Balance training, Gait training, Patient/Family education, Self Care, Joint mobilization, Joint manipulation, Stair training, Dry Needling, Electrical stimulation, Spinal manipulation, Spinal mobilization, Cryotherapy, Moist heat, scar mobilization, Taping, Traction, Ultrasound, Ionotophoresis 4mg /ml Dexamethasone, Manual therapy, and Re-evaluation  PLAN FOR NEXT SESSION:   Manual therapy for muscle extensibility in Cspine HEP for cervical ROM  Postural strengthening/education    Golden Pop, PT 04/26/2023, 2:11 PM

## 2023-04-27 ENCOUNTER — Ambulatory Visit: Payer: 59 | Admitting: Physical Therapy

## 2023-04-28 NOTE — Addendum Note (Signed)
Addended by: Golden Pop on: 04/28/2023 10:13 AM   Modules accepted: Orders

## 2023-05-01 ENCOUNTER — Ambulatory Visit: Payer: 59

## 2023-05-01 ENCOUNTER — Ambulatory Visit: Payer: 59 | Admitting: Physical Therapy

## 2023-05-01 DIAGNOSIS — M542 Cervicalgia: Secondary | ICD-10-CM | POA: Diagnosis not present

## 2023-05-01 DIAGNOSIS — M5459 Other low back pain: Secondary | ICD-10-CM

## 2023-05-01 DIAGNOSIS — M6281 Muscle weakness (generalized): Secondary | ICD-10-CM

## 2023-05-01 NOTE — Therapy (Signed)
OUTPATIENT PHYSICAL THERAPY CERVICAL TREATMENT    Patient Name: [name] [name] MRN: [id number] DOB:Aug 07, 1956, 67 y.o., female Today's Date: 05/01/2023  END OF SESSION:  PT End of Session - 05/01/23 1500     Visit Number 2    Number of Visits 16    Date for PT Re-Evaluation 06/22/23    Authorization Type U[employer]    Progress Note Due on Visit 10    PT Start Time 1400    PT Stop Time 1440    PT Time Calculation (min) 40 min    Activity Tolerance Patient tolerated treatment well;No increased pain    Behavior During Therapy WFL for tasks assessed/performed            Past Medical History:  Diagnosis Date   Actinic keratosis    Acute hepatitis C without mention of hepatic coma(070.51)    Arthritis    fingers   Basal cell carcinoma 12/13/2022   left upper abdomen, EDC   Carpal tunnel syndrome of left wrist    Depression    GERD (gastroesophageal reflux disease)    Hypothyroidism    Insomnia    Insomnia    Memory loss    Osteoporosis    SCC (squamous cell carcinoma) 03/06/2023   left lower lateral leg, refer for Mohs   Squamous cell carcinoma of skin 12/27/2021   Left pretibial - EDC   Tremor    Wears dentures    full upper, partial lower   Past Surgical History:  Procedure Laterality Date   ANKLE SURGERY Right    BLADDER TACT     CATARACT EXTRACTION W/PHACO Left 10/13/2021   Procedure: CATARACT EXTRACTION PHACO AND INTRAOCULAR LENS PLACEMENT (IOC) LEFT;  Surgeon: Lockie Mola, MD;  Location: [facility];  Service: Ophthalmology;  Laterality: Left;  3.97 00:49.0   CATARACT EXTRACTION W/PHACO Right 10/27/2021   Procedure: CATARACT EXTRACTION PHACO AND INTRAOCULAR LENS PLACEMENT (IOC) RIGHT 4.50 00:58.9;  Surgeon: Lockie Mola, MD;  Location: [facility];  Service: Ophthalmology;  Laterality: Right;   HAND SURGERY Right    TONSILLECTOMY     Patient Active Problem List   Diagnosis Date Noted   Hepatitis C 08/19/2016   Depression  08/19/2016   Insomnia 08/19/2016   Hypothyroidism 08/19/2016    PCP:   Salena Saner, MD    REFERRING PROVIDER:   Altamese Cabal, PA-C    REFERRING DIAG:  Diagnosis  M54.2 (ICD-10-CM) - Neck pain    THERAPY DIAG:  Neck pain  Other low back pain  Muscle weakness (generalized)  Rationale for Evaluation and Treatment: Rehabilitation  ONSET DATE: 5 months   SUBJECTIVE:  SUBJECTIVE STATEMENT: Pt reports nothing specifically aggravated by eval visit. Neck doing alright.  Hand dominance: Right  PERTINENT HISTORY:   [name] [name] is a 67yoF who is referred to OPPT from orthopedics for neck pain s/p left (upper )trapezius strain. Pt familiar to our services from recent PT for BLE strength and balance July 2024. Since then pt reports increased back, knee and neck pain with increased soreness at the end of work day at [employer]. MD note also remarks on DJD of shoulder and cervical spine, recommendation for conservative management with muscle relaxer and NSAID therapies. Planning for FU at 6 weeks.   PAIN:  Are you having pain? No pain in neck today. Pt in Rt knee 9/10, pretty bad today.    PRECAUTIONS: None and Other: wearing back brace for comfort   RED FLAGS: None     WEIGHT BEARING RESTRICTIONS: No  FALLS:  Has patient fallen in last 6 months? No  LIVING ENVIRONMENT: Lives with: lives alone Lives in: House/apartment Stairs: Yes: External: 5 steps; on right going up Has following equipment at home: Walker - 2 wheeled and does not use   OCCUPATION: food set up and delivery   PLOF: Independent  PATIENT GOALS: get back stronger  NEXT MD VISIT: sept 19, [place] visit    OBJECTIVE:    TODAY'S TREATMENT:                                                                                                                               DATE: 05/01/2023   -UBE level 2.5 4 minutes alternating Q60sec -Pt moves to hooklying -moist heat applied to neck -inferior force applied to bila tGHJ to achieve pec minor/scap retraction stretch 2x45sec -scap retraction A/ROM 10x3secH  -BUE hooklying scapular Y x15 (cues for scapular retraction and thoracic extension) -cervical rotation with self overpressure 2x30sec bilat  *HEP handout issued   PATIENT EDUCATION:  Education details: HEP education    HOME EXERCISE PROGRAM: Access Code: [email or web address] URL: [email or web address] Date: 05/01/2023 Prepared by: Alvera Novel  Exercises - Seated Scapular Retraction  - 2 x daily - 2 sets - 15 reps - 3sec hold - Supine Cervical Retraction with Towel  - 2 x daily - 2 sets - 15 reps - 3sec hold - Supine Scapular Retraction  - 1 x daily - 7 x weekly - 3 sets - 10 reps - Supine Cervical Rotation PROM  - 2 x daily - 2 reps - 30 hold  ASSESSMENT:  CLINICAL IMPRESSION: Began initial treatment for neck ROM deficits , pain, and soft tissue restriction. Generally good tolerance to use of UBE and heat application, muscle stretch and myofascial release maintained at tolerable amounts of pressure. Initiated HEP this visit with paper handout.  Pt also reports baseline LPB and knee pain, but both are being managed through orthopedic service   OBJECTIVE IMPAIRMENTS: decreased ROM, decreased strength, hypomobility, increased fascial restrictions, increased muscle spasms, impaired flexibility, improper body  mechanics, postural dysfunction, and pain.   ACTIVITY LIMITATIONS: carrying, lifting, standing, squatting, sleeping, reach over head, and hygiene/grooming  PARTICIPATION LIMITATIONS: laundry, driving, shopping, community activity, and occupation  PERSONAL FACTORS: Age, Behavior pattern, Education, Fitness, Past/current experiences, and 1 comorbidity: LBP knee pain  are also  affecting patient's functional outcome.   REHAB POTENTIAL: Good  CLINICAL DECISION MAKING: Stable/uncomplicated  EVALUATION COMPLEXITY: Moderate   GOALS: Goals reviewed with patient? Yes  SHORT TERM GOALS: Target date: 05/31/2023  Pt will be independent with HEP to address cervical stiffness/pain without assist from PT Baseline: to be given at session 2.   Goal status: pending reassessment   LONG TERM GOALS: Target date: 06/22/2023   Pt will increased FOTO to >66 to indicate improved function and reduced impairment from cervical pain  Baseline: 60 Goal status: pending reassessment   2.  Pt will improve NDI by at least 5 pts to demonstrate improved function with ADLs and reduced disability as result of neck pain.  Baseline: 7/50 Goal status: pending reassessment   3.  Pt will improve cervical rotation to at least 55 deg to improve safety with driving Bil  Baseline: 45 L, 52 R  Goal status: pending reassessment   4.  Pt will increase lateral flexion in Cspine by at least 15 deg on the L to improve safety and reduced pain with work tasks of moving and preparing food at [employer]  Baseline: L 8 deg Goal status: pending reassessment   5.  Pt will report pain in neck 2/10 at worst to improve QoL and improve tolerance to work related tasks  Baseline: 6/10  Goal status: pending reassessment      PLAN:  PT FREQUENCY: 1-2x/week  PT DURATION: 8 weeks  PLANNED INTERVENTIONS: Therapeutic exercises, Therapeutic activity, Neuromuscular re-education, Balance training, Gait training, Patient/Family education, Self Care, Joint mobilization, Joint manipulation, Stair training, Dry Needling, Electrical stimulation, Spinal manipulation, Spinal mobilization, Cryotherapy, Moist heat, scar mobilization, Taping, Traction, Ultrasound, Ionotophoresis 4mg /ml Dexamethasone, Manual therapy, and Re-evaluation  PLAN FOR NEXT SESSION:  FU on HEP issue, response     Delno Blaisdell C,  PT 05/01/2023, 3:00 PM   3:00 PM, 05/01/23 Rosamaria Lints, PT, DPT Physical Therapist - [facility]  [phone] [facility])

## 2023-05-02 ENCOUNTER — Ambulatory Visit: Payer: 59 | Admitting: Physical Therapy

## 2023-05-03 ENCOUNTER — Ambulatory Visit: Payer: 59 | Admitting: Physical Therapy

## 2023-05-03 DIAGNOSIS — M6281 Muscle weakness (generalized): Secondary | ICD-10-CM

## 2023-05-03 DIAGNOSIS — M5459 Other low back pain: Secondary | ICD-10-CM

## 2023-05-03 DIAGNOSIS — M542 Cervicalgia: Secondary | ICD-10-CM

## 2023-05-03 DIAGNOSIS — R2681 Unsteadiness on feet: Secondary | ICD-10-CM

## 2023-05-03 NOTE — Therapy (Signed)
OUTPATIENT PHYSICAL THERAPY CERVICAL TREATMENT    Patient Name: [name] [name] MRN: [id number] DOB:08-06-1956, 67 y.o., female Today's Date: 05/03/2023  END OF SESSION:  PT End of Session - 05/03/23 1408     Visit Number 3    Number of Visits 16    Date for PT Re-Evaluation 06/22/23    Authorization Type U[employer]    Progress Note Due on Visit 10    PT Start Time 1405    PT Stop Time 1445    PT Time Calculation (min) 40 min    Activity Tolerance Patient tolerated treatment well;No increased pain    Behavior During Therapy WFL for tasks assessed/performed            Past Medical History:  Diagnosis Date   Actinic keratosis    Acute hepatitis C without mention of hepatic coma(070.51)    Arthritis    fingers   Basal cell carcinoma 12/13/2022   left upper abdomen, EDC   Carpal tunnel syndrome of left wrist    Depression    GERD (gastroesophageal reflux disease)    Hypothyroidism    Insomnia    Insomnia    Memory loss    Osteoporosis    SCC (squamous cell carcinoma) 03/06/2023   left lower lateral leg, refer for Mohs   Squamous cell carcinoma of skin 12/27/2021   Left pretibial - EDC   Tremor    Wears dentures    full upper, partial lower   Past Surgical History:  Procedure Laterality Date   ANKLE SURGERY Right    BLADDER TACT     CATARACT EXTRACTION W/PHACO Left 10/13/2021   Procedure: CATARACT EXTRACTION PHACO AND INTRAOCULAR LENS PLACEMENT (IOC) LEFT;  Surgeon: Lockie Mola, MD;  Location: [facility];  Service: Ophthalmology;  Laterality: Left;  3.97 00:49.0   CATARACT EXTRACTION W/PHACO Right 10/27/2021   Procedure: CATARACT EXTRACTION PHACO AND INTRAOCULAR LENS PLACEMENT (IOC) RIGHT 4.50 00:58.9;  Surgeon: Lockie Mola, MD;  Location: [facility];  Service: Ophthalmology;  Laterality: Right;   HAND SURGERY Right    TONSILLECTOMY     Patient Active Problem List   Diagnosis Date Noted   Hepatitis C 08/19/2016   Depression  08/19/2016   Insomnia 08/19/2016   Hypothyroidism 08/19/2016    PCP:   Salena Saner, MD    REFERRING PROVIDER:   Altamese Cabal, PA-C    REFERRING DIAG:  Diagnosis  M54.2 (ICD-10-CM) - Neck pain    THERAPY DIAG:  Neck pain  Other low back pain  Muscle weakness (generalized)  Unsteadiness on feet  Rationale for Evaluation and Treatment: Rehabilitation  ONSET DATE: 5 months   SUBJECTIVE:  SUBJECTIVE STATEMENT: Pt reports no pain at start of session, but reports R sided stiffness with lifting and rotation at work today.   Hand dominance: Right  PERTINENT HISTORY:   [name] [name] is a 67yoF who is referred to OPPT from orthopedics for neck pain s/p left (upper )trapezius strain. Pt familiar to our services from recent PT for BLE strength and balance July 2024. Since then pt reports increased back, knee and neck pain with increased soreness at the end of work day at [employer]. MD note also remarks on DJD of shoulder and cervical spine, recommendation for conservative management with muscle relaxer and NSAID therapies. Planning for FU at 6 weeks.   PAIN:  Are you having pain? No pain in neck today. Pt in Rt knee 9/10, pretty bad today.    PRECAUTIONS: None and Other: wearing back brace for comfort   RED FLAGS: None     WEIGHT BEARING RESTRICTIONS: No  FALLS:  Has patient fallen in last 6 months? No  LIVING ENVIRONMENT: Lives with: lives alone Lives in: House/apartment Stairs: Yes: External: 5 steps; on right going up Has following equipment at home: Walker - 2 wheeled and does not use   OCCUPATION: food set up and delivery   PLOF: Independent  PATIENT GOALS: get back stronger  NEXT MD VISIT: sept 19, [place] visit    OBJECTIVE:    TODAY'S  TREATMENT:                                                                                                                              DATE: 05/03/2023   PT applied Moist heat pain to shoulders at start of PT treatment. Reports reduced tension in shoulders with heat pack applied.   Seated cervical rotation x 4 with 10 sec hold bil .  Seated UT stretch. 2 x 40 sec bil  Seated levator stretch 2 x 30 sec bil  Seated scapular retraction x 12 with tactile cues for mid/low trac activation  Sit<>supine without assist from PT.  Supine cervical rotation R and L with over pressure from PT 3 x 30 sec.  Overhead pec stretch with PVC pipe 10 x 5 sec hold.  Supine cervical retraction x 10 with 3 sec hold.   Manual therapy with STM to Bil UT and paraspinals with trigger point release x 5 min. -inferior force applied to bila tGHJ to achieve pec minor/scap retraction stretch 2x45sec    PATIENT EDUCATION:  Education details: HEP education    HOME EXERCISE PROGRAM: Access Code: [email or web address] URL: [email or web address] Prepared by: Grier Rocher  Exercises - Seated Upper Trapezius Stretch  - 7 x weekly - 1 x daily - 3 sets - 10 reps - Seated Levator Scapulae Stretch  - 7 x weekly - 1 x daily - 3 sets - 10 reps  Access Code: [email or web address] URL: [email or web address] Date: 05/01/2023 Prepared by: Alvera Novel  Exercises - Seated Scapular Retraction  - 2 x daily -  2 sets - 15 reps - 3sec hold - Supine Cervical Retraction with Towel  - 2 x daily - 2 sets - 15 reps - 3sec hold - Supine Scapular Retraction  - 1 x daily - 7 x weekly - 3 sets - 10 reps - Supine Cervical Rotation PROM  - 2 x daily - 2 reps - 30 hold  ASSESSMENT:  CLINICAL IMPRESSION: Pt reports to PT with no pain on this day but reports stiffness in Cspine R>L per pt report. Difficulty with mid and lower trap activation as well as reduced lateral flexion, but improved from eval. HEP expanded with hand out provided.  Pt will benefit from skilled PT to address pain, stiffness, and postural deficits to improve QoL and return to PLOF. Pt also reports baseline LPB and knee pain, but both are being managed through orthopedic service   OBJECTIVE IMPAIRMENTS: decreased ROM, decreased strength, hypomobility, increased fascial restrictions, increased muscle spasms, impaired flexibility, improper body mechanics, postural dysfunction, and pain.   ACTIVITY LIMITATIONS: carrying, lifting, standing, squatting, sleeping, reach over head, and hygiene/grooming  PARTICIPATION LIMITATIONS: laundry, driving, shopping, community activity, and occupation  PERSONAL FACTORS: Age, Behavior pattern, Education, Fitness, Past/current experiences, and 1 comorbidity: LBP knee pain  are also affecting patient's functional outcome.   REHAB POTENTIAL: Good  CLINICAL DECISION MAKING: Stable/uncomplicated  EVALUATION COMPLEXITY: Moderate   GOALS: Goals reviewed with patient? Yes  SHORT TERM GOALS: Target date: 05/31/2023  Pt will be independent with HEP to address cervical stiffness/pain without assist from PT Baseline: to be given at session 2.   Goal status: pending reassessment   LONG TERM GOALS: Target date: 06/22/2023   Pt will increased FOTO to >66 to indicate improved function and reduced impairment from cervical pain  Baseline: 60 Goal status: pending reassessment   2.  Pt will improve NDI by at least 5 pts to demonstrate improved function with ADLs and reduced disability as result of neck pain.  Baseline: 7/50 Goal status: pending reassessment   3.  Pt will improve cervical rotation to at least 55 deg to improve safety with driving Bil  Baseline: 45 L, 52 R  Goal status: pending reassessment   4.  Pt will increase lateral flexion in Cspine by at least 15 deg on the L to improve safety and reduced pain with work tasks of moving and preparing food at [employer]  Baseline: L 8 deg Goal status: pending  reassessment   5.  Pt will report pain in neck 2/10 at worst to improve QoL and improve tolerance to work related tasks  Baseline: 6/10  Goal status: pending reassessment      PLAN:  PT FREQUENCY: 1-2x/week  PT DURATION: 8 weeks  PLANNED INTERVENTIONS: Therapeutic exercises, Therapeutic activity, Neuromuscular re-education, Balance training, Gait training, Patient/Family education, Self Care, Joint mobilization, Joint manipulation, Stair training, Dry Needling, Electrical stimulation, Spinal manipulation, Spinal mobilization, Cryotherapy, Moist heat, scar mobilization, Taping, Traction, Ultrasound, Ionotophoresis 4mg /ml Dexamethasone, Manual therapy, and Re-evaluation  PLAN FOR NEXT SESSION:   Postural muscle activation. Cervical ROM and manual for tension release.    Grier Rocher PT, DPT  Physical Therapist - [place]  [facility]  3:58 PM 05/03/23

## 2023-05-04 ENCOUNTER — Ambulatory Visit: Payer: 59 | Admitting: Physical Therapy

## 2023-05-09 ENCOUNTER — Encounter: Payer: 59 | Admitting: Physical Therapy

## 2023-05-09 ENCOUNTER — Ambulatory Visit: Payer: 59 | Admitting: Physical Therapy

## 2023-05-09 ENCOUNTER — Ambulatory Visit: Payer: 59 | Admitting: Obstetrics and Gynecology

## 2023-05-10 ENCOUNTER — Ambulatory Visit: Payer: 59 | Admitting: Physical Therapy

## 2023-05-10 DIAGNOSIS — R278 Other lack of coordination: Secondary | ICD-10-CM

## 2023-05-10 DIAGNOSIS — M6281 Muscle weakness (generalized): Secondary | ICD-10-CM

## 2023-05-10 DIAGNOSIS — R2689 Other abnormalities of gait and mobility: Secondary | ICD-10-CM

## 2023-05-10 DIAGNOSIS — R2681 Unsteadiness on feet: Secondary | ICD-10-CM

## 2023-05-10 DIAGNOSIS — M542 Cervicalgia: Secondary | ICD-10-CM

## 2023-05-10 DIAGNOSIS — M5459 Other low back pain: Secondary | ICD-10-CM

## 2023-05-10 NOTE — Therapy (Unsigned)
OUTPATIENT PHYSICAL THERAPY CERVICAL TREATMENT    Patient Name: [name] [name] MRN: [id number] DOB:November 22, 1955, 67 y.o., female Today's Date: 05/10/2023  END OF SESSION:  PT End of Session - 05/10/23 1624     Visit Number 4    Number of Visits 16    Date for PT Re-Evaluation 06/22/23    Authorization Type [employer]    Progress Note Due on Visit 10    PT Start Time 1619    PT Stop Time 1700    PT Time Calculation (min) 41 min    Activity Tolerance Patient tolerated treatment well;No increased pain    Behavior During Therapy WFL for tasks assessed/performed            Past Medical History:  Diagnosis Date   Actinic keratosis    Acute hepatitis C without mention of hepatic coma(070.51)    Arthritis    fingers   Basal cell carcinoma 12/13/2022   left upper abdomen, EDC   Carpal tunnel syndrome of left wrist    Depression    GERD (gastroesophageal reflux disease)    Hypothyroidism    Insomnia    Insomnia    Memory loss    Osteoporosis    SCC (squamous cell carcinoma) 03/06/2023   left lower lateral leg, refer for Mohs   Squamous cell carcinoma of skin 12/27/2021   Left pretibial - EDC   Tremor    Wears dentures    full upper, partial lower   Past Surgical History:  Procedure Laterality Date   ANKLE SURGERY Right    BLADDER TACT     CATARACT EXTRACTION W/PHACO Left 10/13/2021   Procedure: CATARACT EXTRACTION PHACO AND INTRAOCULAR LENS PLACEMENT (IOC) LEFT;  Surgeon: Lockie Mola, MD;  Location: [facility];  Service: Ophthalmology;  Laterality: Left;  3.97 00:49.0   CATARACT EXTRACTION W/PHACO Right 10/27/2021   Procedure: CATARACT EXTRACTION PHACO AND INTRAOCULAR LENS PLACEMENT (IOC) RIGHT 4.50 00:58.9;  Surgeon: Lockie Mola, MD;  Location: [facility];  Service: Ophthalmology;  Laterality: Right;   HAND SURGERY Right    TONSILLECTOMY     Patient Active Problem List   Diagnosis Date Noted   Hepatitis C 08/19/2016   Depression  08/19/2016   Insomnia 08/19/2016   Hypothyroidism 08/19/2016    PCP:   Salena Saner, MD    REFERRING PROVIDER:   Altamese Cabal, PA-C    REFERRING DIAG:  Diagnosis  M54.2 (ICD-10-CM) - Neck pain    THERAPY DIAG:  Neck pain  Other low back pain  Muscle weakness (generalized)  Unsteadiness on feet  Other abnormalities of gait and mobility  Other lack of coordination  Rationale for Evaluation and Treatment: Rehabilitation  ONSET DATE: 5 months   SUBJECTIVE:  SUBJECTIVE STATEMENT: Pt reports no pain at start of session, states that R side and low back were bothering her a little during the weak. Mild cervical stiffness at work today.   Hand dominance: Right  PERTINENT HISTORY:   [name] [name] is a 67yoF who is referred to OPPT from orthopedics for neck pain s/p left (upper )trapezius strain. Pt familiar to our services from recent PT for BLE strength and balance July 2024. Since then pt reports increased back, knee and neck pain with increased soreness at the end of work day at [employer]. MD note also remarks on DJD of shoulder and cervical spine, recommendation for conservative management with muscle relaxer and NSAID therapies. Planning for FU at 6 weeks.   PAIN:  Are you having pain? No pain in neck today. Pt in Rt knee 9/10, pretty bad today.    PRECAUTIONS: None and Other: wearing back brace for comfort   RED FLAGS: None     WEIGHT BEARING RESTRICTIONS: No  FALLS:  Has patient fallen in last 6 months? No  LIVING ENVIRONMENT: Lives with: lives alone Lives in: House/apartment Stairs: Yes: External: 5 steps; on right going up Has following equipment at home: Walker - 2 wheeled and does not use   OCCUPATION: food set up and delivery   PLOF:  Independent  PATIENT GOALS: get back stronger  NEXT MD VISIT: sept 19, [place] visit    OBJECTIVE:    TODAY'S TREATMENT:                                                                                                                              DATE: 05/10/2023   Manual:  STM and trigger point release to bil UT and cervical paraspinals and levator x 15. Performed with cspine in neutral and in stretch for lateral flexion and rotation with lateral flexion. Pt reports decreased stiffness in Cspine at end of treatment.   lateral flexion stretch x 30 sec bil  Seated Levator stretch x 30 sec bil  Cervical rotation x 5 bil with 3 sec hold. BIL ROM symmetrical on this day.  Seated AROM scapular retractionx 12 RTB scapular retraction x 10  Cues for lower trap activation with retraction.   Sit<>supine without Assist.  Supine therex:  LTR with 3 sec hold x 10 bil  Open book from sidelying x 12bil with 2 sec hold Bridge x 10 with hip abuction  Diaphragmatic breathing x 1 min  SLR x 8 bil  SLR with lift over cone x 10 bil Instruction from PT for improved TrA activation to improve spinal stability as well as improved timing of breathing to coordinate movement with core activation. Poor carryover from one movement to the next. Will need to continue to educate.    PATIENT EDUCATION:  Education details: HEP education Benefits for deep core activation to improve spinal stability and reduce compensatory movement strategies.    HOME EXERCISE PROGRAM: Access Code: [email or web address] URL:  [email or web address] Prepared by: Grier Rocher  Exercises - Seated Upper Trapezius Stretch  - 7 x weekly - 1 x daily - 3 sets - 10 reps - Seated Levator Scapulae Stretch  - 7 x weekly - 1 x daily - 3 sets - 10 reps  Access Code: [email or web address] URL: [email or web address] Date: 05/01/2023 Prepared by: Alvera Novel  Exercises - Seated Scapular Retraction  - 2 x daily - 2 sets - 15 reps - 3sec  hold - Supine Cervical Retraction with Towel  - 2 x daily - 2 sets - 15 reps - 3sec hold - Supine Scapular Retraction  - 1 x daily - 7 x weekly - 3 sets - 10 reps - Supine Cervical Rotation PROM  - 2 x daily - 2 reps - 30 hold  ASSESSMENT:  CLINICAL IMPRESSION: Pt reports to PT with no pain on this day but reports stiffness in Cspine R>L per pt report. PT treatment focused on decreased muscle tension and improve muscle extensibility with manual therap as well as improved ROM and deep core activation and proper diaphragmatic breathing; will need significant education to reduce compensatory breathing strategies. Pt will benefit from skilled PT to address pain, stiffness, and postural deficits to improve QoL and return to PLOF. Pt also reports baseline LPB and knee pain, but both are being managed through orthopedic service   OBJECTIVE IMPAIRMENTS: decreased ROM, decreased strength, hypomobility, increased fascial restrictions, increased muscle spasms, impaired flexibility, improper body mechanics, postural dysfunction, and pain.   ACTIVITY LIMITATIONS: carrying, lifting, standing, squatting, sleeping, reach over head, and hygiene/grooming  PARTICIPATION LIMITATIONS: laundry, driving, shopping, community activity, and occupation  PERSONAL FACTORS: Age, Behavior pattern, Education, Fitness, Past/current experiences, and 1 comorbidity: LBP knee pain  are also affecting patient's functional outcome.   REHAB POTENTIAL: Good  CLINICAL DECISION MAKING: Stable/uncomplicated  EVALUATION COMPLEXITY: Moderate   GOALS: Goals reviewed with patient? Yes  SHORT TERM GOALS: Target date: 05/31/2023  Pt will be independent with HEP to address cervical stiffness/pain without assist from PT Baseline: to be given at session 2.   Goal status: pending reassessment   LONG TERM GOALS: Target date: 06/22/2023   Pt will increased FOTO to >66 to indicate improved function and reduced impairment from cervical  pain  Baseline: 60 Goal status: pending reassessment   2.  Pt will improve NDI by at least 5 pts to demonstrate improved function with ADLs and reduced disability as result of neck pain.  Baseline: 7/50 Goal status: pending reassessment   3.  Pt will improve cervical rotation to at least 55 deg to improve safety with driving Bil  Baseline: 45 L, 52 R  Goal status: pending reassessment   4.  Pt will increase lateral flexion in Cspine by at least 15 deg on the L to improve safety and reduced pain with work tasks of moving and preparing food at [employer]  Baseline: L 8 deg Goal status: pending reassessment   5.  Pt will report pain in neck 2/10 at worst to improve QoL and improve tolerance to work related tasks  Baseline: 6/10  Goal status: pending reassessment      PLAN:  PT FREQUENCY: 1-2x/week  PT DURATION: 8 weeks  PLANNED INTERVENTIONS: Therapeutic exercises, Therapeutic activity, Neuromuscular re-education, Balance training, Gait training, Patient/Family education, Self Care, Joint mobilization, Joint manipulation, Stair training, Dry Needling, Electrical stimulation, Spinal manipulation, Spinal mobilization, Cryotherapy, Moist heat, scar mobilization, Taping, Traction, Ultrasound, Ionotophoresis 4mg /ml Dexamethasone, Manual therapy, and Re-evaluation  PLAN FOR NEXT SESSION:  Postural muscle activation. Cervical ROM and manual for tension release. Improved deep core activation and diaphragmatic breathing.    Grier Rocher PT, DPT  Physical Therapist - [place]  [facility]  4:25 PM 05/10/23

## 2023-05-11 ENCOUNTER — Encounter: Payer: 59 | Admitting: Physical Therapy

## 2023-05-11 ENCOUNTER — Ambulatory Visit: Payer: 59 | Admitting: Physical Therapy

## 2023-05-15 ENCOUNTER — Encounter: Payer: 59 | Admitting: Physical Therapy

## 2023-05-16 ENCOUNTER — Encounter: Payer: Self-pay | Admitting: Pulmonary Disease

## 2023-05-16 ENCOUNTER — Ambulatory Visit (INDEPENDENT_AMBULATORY_CARE_PROVIDER_SITE_OTHER): Payer: 59 | Admitting: Pulmonary Disease

## 2023-05-16 VITALS — BP 118/80 | HR 90 | Temp 98.0°F | Ht 65.0 in | Wt 178.6 lb

## 2023-05-16 DIAGNOSIS — R0609 Other forms of dyspnea: Secondary | ICD-10-CM

## 2023-05-16 DIAGNOSIS — K219 Gastro-esophageal reflux disease without esophagitis: Secondary | ICD-10-CM | POA: Diagnosis not present

## 2023-05-16 DIAGNOSIS — R053 Chronic cough: Secondary | ICD-10-CM | POA: Diagnosis not present

## 2023-05-16 DIAGNOSIS — Z23 Encounter for immunization: Secondary | ICD-10-CM

## 2023-05-16 NOTE — Progress Notes (Signed)
Subjective:    Patient ID: [name] [name], female    DOB: April 17, 1956, 67 y.o.   MRN: [id number]  Patient Care Team: Louis Matte, MD as PCP - General (Internal Medicine) Salena Saner, MD as Consulting Physician (Pulmonary Disease)  Chief Complaint  Patient presents with   Follow-up    Occasional dry cough. No wheezing. DOE.    HPI Patient is a 67 year old remote former smoker with a total of 10-pack-year history of smoking who presents for follow-up of an abnormal chest x-ray and pneumonia.  Patient was initially seen on 07 November 2022.  The patient was referred because of nodular changes that were noted the right lung apex which were consistent with infection.  At the time of her initial evaluation she was noted to have purulent sputum production, shortness of breath and cough.  We treated her with broad-spectrum antibiotics follow-up chest x-ray showed resolution of the nodular changes on the right upper lobe after treatment.  Patient was last seen on 22 Dec 2022 at that time she stated that her cough during the day has decreased some and that it mostly occurred at nighttime when she is recumbent.  She also endorsed significant gastroesophageal reflux symptoms which occur particularly at nighttime.  She states that she is having quite a bit of reflux particularly at nighttime.  At that visit she was referred to gastroenterology for evaluation of these persistent symptoms and her PPI therapy was stepped up to esomeprazole 40 mg daily.  She states that her cough has improved somewhat with the esomeprazole however she continues to have severe gastroesophageal reflux symptoms.  She is to see GI in the upcoming weeks.   She has not noted any fevers, chills or sweats since her last visit here.  She does not endorse any wheezing, she has had some shortness of breath on inclines and stairs but not on flat/level ground.  Shortness of breath does not limit activities of daily living.  Overall  feels better with the exception of her persistent gastroesophageal reflux symptoms.  She does not endorse any other symptomatology today.  She has not had PFTs.  She needs flu vaccine today.  Review of Systems A 10 point review of systems was performed and it is as noted above otherwise negative.   Patient Active Problem List   Diagnosis Date Noted   Hepatitis C 08/19/2016   Depression 08/19/2016   Insomnia 08/19/2016   Hypothyroidism 08/19/2016    Social History   Tobacco Use   Smoking status: Former    Current packs/day: 0.00    Average packs/day: 1 pack/day for 10.0 years (10.0 ttl pk-yrs)    Types: Cigarettes    Start date: 27    Quit date: 1983    Years since quitting: 41.7   Smokeless tobacco: Never  Substance Use Topics   Alcohol use: No    Allergies  Allergen Reactions   Sonata [Zaleplon]     Took with another medication with this and it caused hallucinations.   Carbidopa-Levodopa Nausea Only    Current Meds  Medication Sig   amitriptyline (ELAVIL) 50 MG tablet Take 150 mg by mouth at bedtime as needed for sleep.    ARIPiprazole (ABILIFY) 10 MG tablet Take 10 mg by mouth daily.   buPROPion (WELLBUTRIN SR) 150 MG 12 hr tablet Take 150 mg by mouth 3 (three) times daily.    calcium carbonate (OS-CAL) 600 MG TABS tablet Take 600 mg by mouth 2 (two) times daily  with a meal.   donepezil (ARICEPT) 10 MG tablet Take 10 mg by mouth at bedtime.   esomeprazole (NEXIUM) 40 MG capsule TAKE 1 CAPSULE(40 MG) BY MOUTH DAILY   levothyroxine (SYNTHROID) 112 MCG tablet Take 112 mcg by mouth daily before breakfast.   Multiple Vitamins-Minerals (MULTIVITAL-M) TABS Take 1 tablet by mouth daily.    ondansetron (ZOFRAN) 8 MG tablet Take 8 mg by mouth 3 (three) times daily.   Venlafaxine HCl 150 MG TB24 Take 2 tablets by mouth daily.   VICTOZA 18 MG/3ML SOPN Inject 0.6 mg into the skin.    Immunization History  Administered Date(s) Administered   Influenza-Unspecified  05/15/2022   Tdap 07/02/2021   Zoster Recombinant(Shingrix) 07/02/2021        Objective:     BP 118/80 (BP Location: Right Arm, Cuff Size: Normal)   Pulse 90   Temp 98 F (36.7 C)   Ht 5\' 5"  (1.651 m)   Wt 178 lb 9.6 oz (81 kg)   SpO2 96%   BMI 29.72 kg/m   SpO2: 96 % O2 Device: None (Room air)  GENERAL: Well-developed, overweight woman, no acute distress.  Fully ambulatory.  No conversational dyspnea. HEAD: Normocephalic, atraumatic.  EYES: Pupils equal, round, reactive to light.  No scleral icterus.  MOUTH: Poor dentition, mouth malocclusion. NECK: Supple. No thyromegaly. Trachea midline. No JVD.  No adenopathy. PULMONARY: Good air entry bilaterally.  Coarse, otherwise no adventitious sounds. CARDIOVASCULAR: S1 and S2. Regular rate and rhythm.  No rubs, murmurs or gallops heard. ABDOMEN: Benign. MUSCULOSKELETAL: No joint deformity, no clubbing, no edema.  NEUROLOGIC: No overt focal deficit, no gait disturbance, speech is fluent. SKIN: Intact,warm,dry.  Multiple actinic keratoses. PSYCH: Mood and behavior normal.    Assessment & Plan:     ICD-10-CM   1. Dyspnea on exertion  R06.09 Pulmonary Function Test [facility] Only   Will obtain PFTs    2. Gastroesophageal reflux disease, unspecified whether esophagitis present  K21.9    Has upcoming GI appointment Suspect will need EGD Still very symptomatic Continue esomeprazole daily Antireflux measures    3. Chronic cough  R05.3 Pulmonary Function Test [facility] Only   Likely related to reflux Improvement noted but still persistent To have GI eval    4. Flu vaccine need  Z23 Flu Vaccine Trivalent High Dose (Fluad)   Patient received flu vaccine today      Orders Placed This Encounter  Procedures   Flu Vaccine Trivalent High Dose (Fluad)   Pulmonary Function Test [facility] Only    Standing Status:   Future    Standing Expiration Date:   05/15/2024    Order Specific Question:   Full PFT: includes the following: basic  spirometry, spirometry pre & post bronchodilator, diffusion capacity (DLCO), lung volumes    Answer:   Full PFT    Order Specific Question:   This test can only be performed at    Answer:   [facility]    Will see the patient in follow-up in 3 to 4 months time she is to contact us prior to that time should any new difficulties arise.      Gailen Shelter, MD Advanced Bronchoscopy PCCM  Pulmonary-[place]    *This note was dictated using voice recognition software/Dragon.  Despite best efforts to proofread, errors can occur which can change the meaning. Any transcriptional errors that result from this process are unintentional and may not be fully corrected at the time of dictation.

## 2023-05-16 NOTE — Patient Instructions (Signed)
You received your flu vaccine today.  Keep your appointment with the gastroenterologist.  I suspect they will have to endoscopy given your persistent symptoms.  We are going to reschedule breathing tests.  Will see you in follow-up in 3 to 4 months time call sooner should any new problems arise.

## 2023-05-17 ENCOUNTER — Encounter: Payer: 59 | Admitting: Physical Therapy

## 2023-05-17 ENCOUNTER — Ambulatory Visit: Payer: 59 | Attending: Orthopedic Surgery | Admitting: Physical Therapy

## 2023-05-17 DIAGNOSIS — M542 Cervicalgia: Secondary | ICD-10-CM | POA: Diagnosis present

## 2023-05-17 DIAGNOSIS — R2681 Unsteadiness on feet: Secondary | ICD-10-CM | POA: Diagnosis present

## 2023-05-17 DIAGNOSIS — M5459 Other low back pain: Secondary | ICD-10-CM | POA: Insufficient documentation

## 2023-05-17 DIAGNOSIS — M6281 Muscle weakness (generalized): Secondary | ICD-10-CM | POA: Diagnosis present

## 2023-05-17 NOTE — Therapy (Signed)
OUTPATIENT PHYSICAL THERAPY CERVICAL TREATMENT    Patient Name: [name] [name] MRN: [id number] DOB:01/06/1956, 67 y.o., female Today's Date: 05/17/2023  END OF SESSION:  PT End of Session - 05/17/23 1523     Visit Number 5    Number of Visits 16    Date for PT Re-Evaluation 06/22/23    Authorization Type UHC    Progress Note Due on Visit 10    PT Start Time 1508    PT Stop Time 1548    PT Time Calculation (min) 40 min    Activity Tolerance Patient tolerated treatment well;No increased pain    Behavior During Therapy WFL for tasks assessed/performed            Past Medical History:  Diagnosis Date   Actinic keratosis    Acute hepatitis C without mention of hepatic coma(070.51)    Arthritis    fingers   Basal cell carcinoma 12/13/2022   left upper abdomen, EDC   Carpal tunnel syndrome of left wrist    Depression    GERD (gastroesophageal reflux disease)    Hypothyroidism    Insomnia    Insomnia    Memory loss    Osteoporosis    SCC (squamous cell carcinoma) 03/06/2023   left lower lateral leg, refer for Mohs   Squamous cell carcinoma of skin 12/27/2021   Left pretibial - EDC   Tremor    Wears dentures    full upper, partial lower   Past Surgical History:  Procedure Laterality Date   ANKLE SURGERY Right    BLADDER TACT     CATARACT EXTRACTION W/PHACO Left 10/13/2021   Procedure: CATARACT EXTRACTION PHACO AND INTRAOCULAR LENS PLACEMENT (IOC) LEFT;  Surgeon: Lockie Mola, MD;  Location: [facility];  Service: Ophthalmology;  Laterality: Left;  3.97 00:49.0   CATARACT EXTRACTION W/PHACO Right 10/27/2021   Procedure: CATARACT EXTRACTION PHACO AND INTRAOCULAR LENS PLACEMENT (IOC) RIGHT 4.50 00:58.9;  Surgeon: Lockie Mola, MD;  Location: [facility];  Service: Ophthalmology;  Laterality: Right;   HAND SURGERY Right    TONSILLECTOMY     Patient Active Problem List   Diagnosis Date Noted   Hepatitis C 08/19/2016   Depression  08/19/2016   Insomnia 08/19/2016   Hypothyroidism 08/19/2016    PCP:   Salena Saner, MD    REFERRING PROVIDER:   Altamese Cabal, PA-C    REFERRING DIAG:  Diagnosis  M54.2 (ICD-10-CM) - Neck pain    THERAPY DIAG:  Unsteadiness on feet  Neck pain  Other low back pain  Muscle weakness (generalized)  Rationale for Evaluation and Treatment: Rehabilitation  ONSET DATE: 5 months   SUBJECTIVE:  SUBJECTIVE STATEMENT: Pt reports no neck pain, but rates back pain 10/10 but shows no s/s of pain throughout session.     Hand dominance: Right  PERTINENT HISTORY:   [name] [name] is a 67yoF who is referred to OPPT from orthopedics for neck pain s/p left (upper )trapezius strain. Pt familiar to our services from recent PT for BLE strength and balance July 2024. Since then pt reports increased back, knee and neck pain with increased soreness at the end of work day at [employer]. MD note also remarks on DJD of shoulder and cervical spine, recommendation for conservative management with muscle relaxer and NSAID therapies. Planning for FU at 6 weeks.   PAIN:  Are you having pain? No pain in neck today. Low back pain 10/10.   PRECAUTIONS: None and Other: wearing back brace for comfort   RED FLAGS: None     WEIGHT BEARING RESTRICTIONS: No  FALLS:  Has patient fallen in last 6 months? No  LIVING ENVIRONMENT: Lives with: lives alone Lives in: House/apartment Stairs: Yes: External: 5 steps; on right going up Has following equipment at home: Walker - 2 wheeled and does not use   OCCUPATION: food set up and delivery   PLOF: Independent  PATIENT GOALS: get back stronger  NEXT MD VISIT: sept 19, [place] visit    OBJECTIVE:    TODAY'S TREATMENT:                                                                                                                               DATE: 05/17/2023   Throughout session. PT placed moist heat over shoulders for pain management and on low back in supine:   Nustep BLE/BUE reciprocal movement training to improve trunkal rotation.   lateral flexion/UT stretch 2x 30 sec bil  Seated Levator stretch x 30 sec bil  Cervical rotation x 5 bil with 3 sec hold. BIL ROM symmetrical on this day.  Shoulder roll with hold on depressed, retraction position x 2 sec  Seated AROM scapular retractionx 12 YTB standing shoulder extension x 10  Cues for lower trap activation with retraction movements .  HS stretch x 20 sec bil  Gluteal stretch x 20 sec bil  Single knee to chest x 20 sec bil   Sit<>supine without Assist.  Supine therex:  LTR with 3 sec hold x 10 bil  Open book from sidelying x 12bil with 2 sec hold Bridge x 10    PATIENT EDUCATION:  Education details: HEP education Benefits for deep core activation to improve spinal stability and reduce compensatory movement strategies.    HOME EXERCISE PROGRAM: Access Code: [email or web address] URL: [email or web address] Prepared by: Grier Rocher  Exercises - Seated Upper Trapezius Stretch  - 7 x weekly - 1 x daily - 3 sets - 10 reps - Seated Levator Scapulae Stretch  - 7 x weekly - 1 x daily - 3 sets - 10 reps  Access Code: [email or web address] URL:  [email or web address] Date: 05/01/2023 Prepared by: Alvera Novel  Exercises - Seated Scapular Retraction  - 2 x daily - 2 sets - 15 reps - 3sec hold - Supine Cervical Retraction with Towel  - 2 x daily - 2 sets - 15 reps - 3sec hold - Supine Scapular Retraction  - 1 x daily - 7 x weekly - 3 sets - 10 reps - Supine Cervical Rotation PROM  - 2 x daily - 2 reps - 30 hold  ASSESSMENT:  CLINICAL IMPRESSION:  Pt reports to PT with no pain in cspine, but 10/10 pain in low back reported. But no s/s of pain throughout session. PT  treatment focused on improved postural muscle activation, increased spinal mobility. Pt reports decreased stiffness in low back at end of session.  Pt will benefit from skilled PT to address pain, stiffness, and postural deficits to improve QoL and return to PLOF. Pt also reports baseline LPB and knee pain, but both are being managed through orthopedic service   OBJECTIVE IMPAIRMENTS: decreased ROM, decreased strength, hypomobility, increased fascial restrictions, increased muscle spasms, impaired flexibility, improper body mechanics, postural dysfunction, and pain.   ACTIVITY LIMITATIONS: carrying, lifting, standing, squatting, sleeping, reach over head, and hygiene/grooming  PARTICIPATION LIMITATIONS: laundry, driving, shopping, community activity, and occupation  PERSONAL FACTORS: Age, Behavior pattern, Education, Fitness, Past/current experiences, and 1 comorbidity: LBP knee pain  are also affecting patient's functional outcome.   REHAB POTENTIAL: Good  CLINICAL DECISION MAKING: Stable/uncomplicated  EVALUATION COMPLEXITY: Moderate   GOALS: Goals reviewed with patient? Yes  SHORT TERM GOALS: Target date: 05/31/2023  Pt will be independent with HEP to address cervical stiffness/pain without assist from PT Baseline: to be given at session 2.   Goal status: pending reassessment   LONG TERM GOALS: Target date: 06/22/2023   Pt will increased FOTO to >66 to indicate improved function and reduced impairment from cervical pain  Baseline: 60 Goal status: pending reassessment   2.  Pt will improve NDI by at least 5 pts to demonstrate improved function with ADLs and reduced disability as result of neck pain.  Baseline: 7/50 Goal status: pending reassessment   3.  Pt will improve cervical rotation to at least 55 deg to improve safety with driving Bil  Baseline: 45 L, 52 R  Goal status: pending reassessment   4.  Pt will increase lateral flexion in Cspine by at least 15 deg on the L to  improve safety and reduced pain with work tasks of moving and preparing food at [employer]  Baseline: L 8 deg Goal status: pending reassessment   5.  Pt will report pain in neck 2/10 at worst to improve QoL and improve tolerance to work related tasks  Baseline: 6/10  Goal status: pending reassessment      PLAN:  PT FREQUENCY: 1-2x/week  PT DURATION: 8 weeks  PLANNED INTERVENTIONS: Therapeutic exercises, Therapeutic activity, Neuromuscular re-education, Balance training, Gait training, Patient/Family education, Self Care, Joint mobilization, Joint manipulation, Stair training, Dry Needling, Electrical stimulation, Spinal manipulation, Spinal mobilization, Cryotherapy, Moist heat, scar mobilization, Taping, Traction, Ultrasound, Ionotophoresis 4mg /ml Dexamethasone, Manual therapy, and Re-evaluation  PLAN FOR NEXT SESSION:   Postural muscle activation. Cervical ROM and manual for tension release. Improved deep core activation    Grier Rocher PT, DPT  Physical Therapist - [facility]  [facility]  3:28 PM 05/17/23

## 2023-05-23 ENCOUNTER — Ambulatory Visit: Payer: 59 | Admitting: Physical Therapy

## 2023-05-23 DIAGNOSIS — R2681 Unsteadiness on feet: Secondary | ICD-10-CM | POA: Diagnosis not present

## 2023-05-23 DIAGNOSIS — M542 Cervicalgia: Secondary | ICD-10-CM

## 2023-05-23 NOTE — Therapy (Unsigned)
OUTPATIENT PHYSICAL THERAPY CERVICAL TREATMENT    Patient Name: [name] [name] MRN: [id number] DOB:[birth year], 67 y.o., female Today's Date: 05/23/2023  END OF SESSION:  PT End of Session - 05/23/23 1620     Visit Number 6    Number of Visits 16    Date for PT Re-Evaluation 06/22/23    Authorization Type UHC    Progress Note Due on Visit 10    PT Start Time 1619    PT Stop Time 1659    PT Time Calculation (min) 40 min    Equipment Utilized During Treatment Gait belt    Activity Tolerance Patient tolerated treatment well;No increased pain    Behavior During Therapy WFL for tasks assessed/performed             Past Medical History:  Diagnosis Date   Actinic keratosis    Acute hepatitis C without mention of hepatic coma(070.51)    Arthritis    fingers   Basal cell carcinoma 12/13/2022   left upper abdomen, EDC   Carpal tunnel syndrome of left wrist    Depression    GERD (gastroesophageal reflux disease)    Hypothyroidism    Insomnia    Insomnia    Memory loss    Osteoporosis    SCC (squamous cell carcinoma) 03/06/2023   left lower lateral leg, refer for Mohs   Squamous cell carcinoma of skin 12/27/2021   Left pretibial - EDC   Tremor    Wears dentures    full upper, partial lower   Past Surgical History:  Procedure Laterality Date   ANKLE SURGERY Right    BLADDER TACT     CATARACT EXTRACTION W/PHACO Left 10/13/2021   Procedure: CATARACT EXTRACTION PHACO AND INTRAOCULAR LENS PLACEMENT (IOC) LEFT;  Surgeon: Lockie Mola, MD;  Location: [facility];  Service: Ophthalmology;  Laterality: Left;  3.97 00:49.0   CATARACT EXTRACTION W/PHACO Right 10/27/2021   Procedure: CATARACT EXTRACTION PHACO AND INTRAOCULAR LENS PLACEMENT (IOC) RIGHT 4.50 00:58.9;  Surgeon: Lockie Mola, MD;  Location: [facility];  Service: Ophthalmology;  Laterality: Right;   HAND SURGERY Right    TONSILLECTOMY     Patient Active Problem List   Diagnosis Date  Noted   Hepatitis C 08/19/2016   Depression 08/19/2016   Insomnia 08/19/2016   Hypothyroidism 08/19/2016    PCP:   Salena Saner, MD    REFERRING PROVIDER:   Altamese Cabal, PA-C    REFERRING DIAG:  Diagnosis  M54.2 (ICD-10-CM) - Neck pain    THERAPY DIAG:  Neck pain  Rationale for Evaluation and Treatment: Rehabilitation  ONSET DATE: 5 months   SUBJECTIVE:  SUBJECTIVE STATEMENT: Pt reports no neck pain but continued tightness in the neck. Pt reports she is seeing someone about her spine on Friday and she is having some ankle swelling.  Hand dominance: Right  PERTINENT HISTORY:   [name] [name] is a 67yoF who is referred to OPPT from orthopedics for neck pain s/p left (upper )trapezius strain. Pt familiar to our services from recent PT for BLE strength and balance July 2024. Since then pt reports increased back, knee and neck pain with increased soreness at the end of work day at [employer]. MD note also remarks on DJD of shoulder and cervical spine, recommendation for conservative management with muscle relaxer and NSAID therapies. Planning for FU at 6 weeks.   PAIN:  Are you having pain? No pain in neck today. Low back pain 10/10.   PRECAUTIONS: None and Other: wearing back brace for comfort   RED FLAGS: None     WEIGHT BEARING RESTRICTIONS: No  FALLS:  Has patient fallen in last 6 months? No  LIVING ENVIRONMENT: Lives with: lives alone Lives in: House/apartment Stairs: Yes: External: 5 steps; on right going up Has following equipment at home: Walker - 2 wheeled and does not use   OCCUPATION: food set up and delivery   PLOF: Independent  PATIENT GOALS: get back stronger  NEXT MD VISIT: sept 19, [place] visit    OBJECTIVE:    TODAY'S TREATMENT:                                                                                                                               DATE: 05/23/2023   Throughout session. PT placed moist heat over shoulders for pain management and on low back in supine:   lateral flexion/UT stretch 2x 30 sec bil  Seated Levator stretch x 30 sec bil  Seated row with GTB 3 x 10 reps  Seated B shoulder ER 2 x 15 reps with GTB  Seated AROM scapular retractionx 12 YTB standing shoulder extension x 10  Open book stretch x 10 ea side  LTR x 10 ea side  mcGill Curl up x 10 reps -cues for appropriate positioning and form   Manual UT stretch L x 45 sec with GH depression  Cervical rotation x x 30 sec hold at end range  L UT TP release and STM x 4 min   PATIENT EDUCATION:  Education details: HEP education Benefits for deep core activation to improve spinal stability and reduce compensatory movement strategies.    HOME EXERCISE PROGRAM: Access Code: [email or web address] URL: [email or web address] Prepared by: Grier Rocher  Exercises - Seated Upper Trapezius Stretch  - 7 x weekly - 1 x daily - 3 sets - 10 reps - Seated Levator Scapulae Stretch  - 7 x weekly - 1 x daily - 3 sets - 10 reps  Access Code: [email or web address] URL: [email or web address] Date: 05/01/2023 Prepared by: Alvera Novel  Exercises - Seated Scapular Retraction  - 2  x daily - 2 sets - 15 reps - 3sec hold - Supine Cervical Retraction with Towel  - 2 x daily - 2 sets - 15 reps - 3sec hold - Supine Scapular Retraction  - 1 x daily - 7 x weekly - 3 sets - 10 reps - Supine Cervical Rotation PROM  - 2 x daily - 2 reps - 30 hold  ASSESSMENT:  CLINICAL IMPRESSION:  Continued with current plan of care as laid out in evaluation and recent prior sessions. Pt remains motivated to advance progress toward goals in order to maximize independence and safety at home. Pt requires high level assistance and cuing for completion of exercises in order to  provide adequate level of stimulation challenge while minimizing pain and discomfort when possible. Pt closely monitored throughout session pt response and to maximize patient safety during interventions. Pt continues to demonstrate progress toward goals AEB progression of interventions this date either in volume or intensity.   OBJECTIVE IMPAIRMENTS: decreased ROM, decreased strength, hypomobility, increased fascial restrictions, increased muscle spasms, impaired flexibility, improper body mechanics, postural dysfunction, and pain.   ACTIVITY LIMITATIONS: carrying, lifting, standing, squatting, sleeping, reach over head, and hygiene/grooming  PARTICIPATION LIMITATIONS: laundry, driving, shopping, community activity, and occupation  PERSONAL FACTORS: Age, Behavior pattern, Education, Fitness, Past/current experiences, and 1 comorbidity: LBP knee pain  are also affecting patient's functional outcome.   REHAB POTENTIAL: Good  CLINICAL DECISION MAKING: Stable/uncomplicated  EVALUATION COMPLEXITY: Moderate   GOALS: Goals reviewed with patient? Yes  SHORT TERM GOALS: Target date: 05/31/2023  Pt will be independent with HEP to address cervical stiffness/pain without assist from PT Baseline: to be given at session 2.   Goal status: pending reassessment   LONG TERM GOALS: Target date: 06/22/2023   Pt will increased FOTO to >66 to indicate improved function and reduced impairment from cervical pain  Baseline: 60 Goal status: pending reassessment   2.  Pt will improve NDI by at least 5 pts to demonstrate improved function with ADLs and reduced disability as result of neck pain.  Baseline: 7/50 Goal status: pending reassessment   3.  Pt will improve cervical rotation to at least 55 deg to improve safety with driving Bil  Baseline: 45 L, 52 R  Goal status: pending reassessment   4.  Pt will increase lateral flexion in Cspine by at least 15 deg on the L to improve safety and reduced pain  with work tasks of moving and preparing food at [employer]  Baseline: L 8 deg Goal status: pending reassessment   5.  Pt will report pain in neck 2/10 at worst to improve QoL and improve tolerance to work related tasks  Baseline: 6/10  Goal status: pending reassessment      PLAN:  PT FREQUENCY: 1-2x/week  PT DURATION: 8 weeks  PLANNED INTERVENTIONS: Therapeutic exercises, Therapeutic activity, Neuromuscular re-education, Balance training, Gait training, Patient/Family education, Self Care, Joint mobilization, Joint manipulation, Stair training, Dry Needling, Electrical stimulation, Spinal manipulation, Spinal mobilization, Cryotherapy, Moist heat, scar mobilization, Taping, Traction, Ultrasound, Ionotophoresis 4mg /ml Dexamethasone, Manual therapy, and Re-evaluation  PLAN FOR NEXT SESSION:   Postural muscle activation. Cervical ROM and manual for tension release. Improved deep core activation    Norman Herrlich PT ,DPT Physical Therapist- [place]  [facility]  4:21 PM 05/23/23

## 2023-05-24 ENCOUNTER — Other Ambulatory Visit: Payer: Self-pay

## 2023-05-26 ENCOUNTER — Encounter: Payer: 59 | Admitting: Physical Therapy

## 2023-05-29 ENCOUNTER — Encounter: Payer: 59 | Admitting: Physical Therapy

## 2023-05-30 ENCOUNTER — Other Ambulatory Visit: Payer: Self-pay

## 2023-05-30 ENCOUNTER — Encounter: Payer: Self-pay | Admitting: Gastroenterology

## 2023-05-30 ENCOUNTER — Ambulatory Visit (INDEPENDENT_AMBULATORY_CARE_PROVIDER_SITE_OTHER): Payer: 59 | Admitting: Gastroenterology

## 2023-05-30 VITALS — BP 130/83 | HR 97 | Temp 98.2°F | Ht 65.0 in | Wt 178.2 lb

## 2023-05-30 DIAGNOSIS — Z1211 Encounter for screening for malignant neoplasm of colon: Secondary | ICD-10-CM

## 2023-05-30 DIAGNOSIS — K219 Gastro-esophageal reflux disease without esophagitis: Secondary | ICD-10-CM

## 2023-05-30 MED ORDER — ESOMEPRAZOLE MAGNESIUM 40 MG PO CPDR
40.0000 mg | DELAYED_RELEASE_CAPSULE | Freq: Two times a day (BID) | ORAL | 0 refills | Status: DC
Start: 2023-05-30 — End: 2023-12-28

## 2023-05-30 MED ORDER — NA SULFATE-K SULFATE-MG SULF 17.5-3.13-1.6 GM/177ML PO SOLN
354.0000 mL | Freq: Once | ORAL | 0 refills | Status: AC
Start: 2023-05-30 — End: 2023-05-30

## 2023-05-30 NOTE — Progress Notes (Signed)
Arlyss Repress, MD [address]  [address]  [place], [place] [address]  Main: [phone]  Fax: [phone]    Gastroenterology Consultation  Referring Provider:     Louis Matte Primary Care Physician:  Leanna Sato, MD Primary Gastroenterologist:  Dr. Arlyss Repress Reason for Consultation: Reflux        HPI:   [name] [name] is a 67 y.o. female referred by Dr. Marvis Moeller, Doralee Albino, MD  for consultation & management of reflux symptoms.  She reports that for last 4 years, she has been experiencing burning in the chest, regurgitation of food, sour taste in mouth.  She is taking Nexium 40 mg daily with partial relief of symptoms.  She had an episode of aspiration event resulting in pneumonia, thought to be secondary to severe acid reflux.  She denies any difficulty swallowing.  She tried Tums, omeprazole in the past.  She denies any nocturnal symptoms.  She drinks 2 cups of coffee daily, likes eating chocolate.  Does not drink alcohol, does not smoke  NSAIDs: None  Antiplts/Anticoagulants/Anti thrombotics: None  GI Procedures: None  Past Medical History:  Diagnosis Date   Actinic keratosis    Acute hepatitis C without mention of hepatic coma(070.51)    Arthritis    fingers   Basal cell carcinoma 12/13/2022   left upper abdomen, EDC   Carpal tunnel syndrome of left wrist    Depression    GERD (gastroesophageal reflux disease)    Hypothyroidism    Insomnia    Insomnia    Memory loss    Osteoporosis    SCC (squamous cell carcinoma) 03/06/2023   left lower lateral leg, refer for Mohs   Squamous cell carcinoma of skin 12/27/2021   Left pretibial - EDC   Tremor    Wears dentures    full upper, partial lower    Past Surgical History:  Procedure Laterality Date   ANKLE SURGERY Right    BLADDER TACT     CATARACT EXTRACTION W/PHACO Left 10/13/2021   Procedure: CATARACT EXTRACTION PHACO AND INTRAOCULAR LENS PLACEMENT (IOC) LEFT;  Surgeon: Lockie Mola, MD;  Location: [facility];  Service: Ophthalmology;  Laterality: Left;  3.97 00:49.0   CATARACT EXTRACTION W/PHACO Right 10/27/2021   Procedure: CATARACT EXTRACTION PHACO AND INTRAOCULAR LENS PLACEMENT (IOC) RIGHT 4.50 00:58.9;  Surgeon: Lockie Mola, MD;  Location: [facility];  Service: Ophthalmology;  Laterality: Right;   HAND SURGERY Right    TONSILLECTOMY       Current Outpatient Medications:    amitriptyline (ELAVIL) 50 MG tablet, Take 150 mg by mouth at bedtime as needed for sleep. , Disp: , Rfl:    ARIPiprazole (ABILIFY) 5 MG tablet, Take 5 mg by mouth daily., Disp: , Rfl:    buPROPion (WELLBUTRIN SR) 150 MG 12 hr tablet, Take 150 mg by mouth 3 (three) times daily. , Disp: , Rfl:    calcium carbonate (OS-CAL) 600 MG TABS tablet, Take 600 mg by mouth 2 (two) times daily with a meal., Disp: , Rfl:    donepezil (ARICEPT) 10 MG tablet, Take 10 mg by mouth at bedtime., Disp: , Rfl:    esomeprazole (NEXIUM) 40 MG capsule, Take 1 capsule (40 mg total) by mouth 2 (two) times daily before a meal., Disp: 60 capsule, Rfl: 0   levothyroxine (SYNTHROID) 112 MCG tablet, Take 112 mcg by mouth daily before breakfast., Disp: , Rfl:    methocarbamol (ROBAXIN) 500 MG tablet,  Take 1 tablet by mouth at bedtime., Disp: , Rfl:    Multiple Vitamins-Minerals (MULTIVITAL-M) TABS, Take 1 tablet by mouth daily. , Disp: , Rfl:    mupirocin ointment (BACTROBAN) 2 %, Apply 1 Application topically daily. Qd to wound on left lower leg until resolved, Disp: 22 g, Rfl: 0   Na Sulfate-K Sulfate-Mg Sulf 17.5-3.13-1.6 GM/177ML SOLN, Take 354 mLs by mouth once for 1 dose., Disp: 354 mL, Rfl: 0   ondansetron (ZOFRAN) 8 MG tablet, Take 8 mg by mouth 3 (three) times daily., Disp: , Rfl:    Venlafaxine HCl 150 MG TB24, Take 2 tablets by mouth daily., Disp: , Rfl:    VICTOZA 18 MG/3ML SOPN, Inject 0.6 mg into the skin., Disp: , Rfl:    Family History  Problem Relation Age of Onset    Dementia Mother    Breast cancer Sister 82   Neuropathy Neg Hx    Tremor Neg Hx      Social History   Tobacco Use   Smoking status: Former    Current packs/day: 0.00    Average packs/day: 1 pack/day for 10.0 years (10.0 ttl pk-yrs)    Types: Cigarettes    Start date: 84    Quit date: 57    Years since quitting: 41.8   Smokeless tobacco: Never  Vaping Use   Vaping status: Never Used  Substance Use Topics   Alcohol use: No   Drug use: No    Allergies as of 05/30/2023 - Review Complete 05/16/2023  Allergen Reaction Noted   Sonata [zaleplon]  07/25/2013   Carbidopa-levodopa Nausea Only 10/06/2021    Review of Systems:    All systems reviewed and negative except where noted in HPI.   Physical Exam:  BP 130/83 (BP Location: Left Arm, Patient Position: Sitting, Cuff Size: Normal)   Pulse 97   Temp 98.2 F (36.8 C) (Oral)   Ht 5\' 5"  (1.651 m)   Wt 178 lb 4 oz (80.9 kg)   BMI 29.66 kg/m  No LMP recorded. Patient is postmenopausal.  General:   Alert,  Well-developed, well-nourished, pleasant and cooperative in NAD Head:  Normocephalic and atraumatic. Eyes:  Sclera clear, no icterus.   Conjunctiva pink. Ears:  Normal auditory acuity. Nose:  No deformity, discharge, or lesions. Mouth:  No deformity or lesions,oropharynx pink & moist. Neck:  Supple; no masses or thyromegaly. Lungs:  Respirations even and unlabored.  Clear throughout to auscultation.   No wheezes, crackles, or rhonchi. No acute distress. Heart:  Regular rate and rhythm; no murmurs, clicks, rubs, or gallops. Abdomen:  Normal bowel sounds. Soft, non-tender and non-distended without masses, hepatosplenomegaly or hernias noted.  No guarding or rebound tenderness.   Rectal: Not performed Msk:  Symmetrical without gross deformities. Good, equal movement & strength bilaterally. Pulses:  Normal pulses noted. Extremities:  No clubbing or edema.  No cyanosis. Neurologic:  Alert and oriented x3;  grossly normal  neurologically. Skin:  Intact without significant lesions or rashes. No jaundice. Psych:  Alert and cooperative. Normal mood and affect.  Imaging Studies: No abdominal imaging  Assessment and Plan:   [name] [name] is a 67 y.o. female with history of hypothyroidism is seen in consultation for 4 years history of GERD symptoms, ?  Aspiration pneumonia secondary to acid reflux  Chronic GERD Discussed about antireflux lifestyle, advised to cut back on coffee and chocolate Advised to increase Nexium 40 mg to twice daily before meals, 1 month prescription sent Recommend EGD for  further evaluation  Colon cancer screening Discussed about colonoscopy and patient is agreeable   Follow up based on the above workup   Arlyss Repress, MD

## 2023-05-31 ENCOUNTER — Telehealth: Payer: Self-pay

## 2023-05-31 ENCOUNTER — Ambulatory Visit: Payer: 59 | Admitting: Physical Therapy

## 2023-05-31 NOTE — Telephone Encounter (Signed)
Per pt phone call pt is unable to have procedure on 06-06-2023 pt doesn't have transportation. Would like to r/s .

## 2023-05-31 NOTE — Telephone Encounter (Signed)
Called patient and reschedule patient to 06/22/2023 with Dr. Allegra Lai in [place]. Patient states she wants to go to [place] not [place]. Mailed out new instructions and sent to [employer]. Nash Dimmer and she will get patient moved to [facility] on 06/22/2023. Called trish and she will get patient moved to 06/22/2023 at [facility]

## 2023-06-01 ENCOUNTER — Ambulatory Visit: Payer: 59 | Admitting: Physical Therapy

## 2023-06-01 DIAGNOSIS — M542 Cervicalgia: Secondary | ICD-10-CM

## 2023-06-01 DIAGNOSIS — R2681 Unsteadiness on feet: Secondary | ICD-10-CM | POA: Diagnosis not present

## 2023-06-01 NOTE — Therapy (Signed)
OUTPATIENT PHYSICAL THERAPY CERVICAL TREATMENT    Patient Name: [name] [name] MRN: [id number] DOB:1956/08/05, 67 y.o., female Today's Date: 06/01/2023  END OF SESSION:  PT End of Session - 06/01/23 1516     Visit Number 7    Number of Visits 16    Date for PT Re-Evaluation 06/22/23    Authorization Type UHC    Progress Note Due on Visit 10    PT Start Time 1400    PT Stop Time 1440    PT Time Calculation (min) 40 min    Equipment Utilized During Treatment Gait belt    Activity Tolerance Patient tolerated treatment well;No increased pain    Behavior During Therapy WFL for tasks assessed/performed              Past Medical History:  Diagnosis Date   Actinic keratosis    Acute hepatitis C without mention of hepatic coma(070.51)    Arthritis    fingers   Basal cell carcinoma 12/13/2022   left upper abdomen, EDC   Carpal tunnel syndrome of left wrist    Depression    GERD (gastroesophageal reflux disease)    Hypothyroidism    Insomnia    Insomnia    Memory loss    Osteoporosis    SCC (squamous cell carcinoma) 03/06/2023   left lower lateral leg, refer for Mohs   Squamous cell carcinoma of skin 12/27/2021   Left pretibial - EDC   Tremor    Wears dentures    full upper, partial lower   Past Surgical History:  Procedure Laterality Date   ANKLE SURGERY Right    BLADDER TACT     CATARACT EXTRACTION W/PHACO Left 10/13/2021   Procedure: CATARACT EXTRACTION PHACO AND INTRAOCULAR LENS PLACEMENT (IOC) LEFT;  Surgeon: Lockie Mola, MD;  Location: [facility];  Service: Ophthalmology;  Laterality: Left;  3.97 00:49.0   CATARACT EXTRACTION W/PHACO Right 10/27/2021   Procedure: CATARACT EXTRACTION PHACO AND INTRAOCULAR LENS PLACEMENT (IOC) RIGHT 4.50 00:58.9;  Surgeon: Lockie Mola, MD;  Location: [facility];  Service: Ophthalmology;  Laterality: Right;   HAND SURGERY Right    TONSILLECTOMY     Patient Active Problem List   Diagnosis Date  Noted   Hepatitis C 08/19/2016   Depression 08/19/2016   Insomnia 08/19/2016   Hypothyroidism 08/19/2016    PCP:   Salena Saner, MD    REFERRING PROVIDER:   Altamese Cabal, PA-C    REFERRING DIAG:  Diagnosis  M54.2 (ICD-10-CM) - Neck pain    THERAPY DIAG:  No diagnosis found.  Rationale for Evaluation and Treatment: Rehabilitation  ONSET DATE: 5 months   SUBJECTIVE:  SUBJECTIVE STATEMENT: Pt reports no neck pain but continued tightness in the neck. Pt reports she brought in an order for her back but would like to wait until next visit to address this with therapy/ new evaluation.  Hand dominance: Right  PERTINENT HISTORY:   [name] [name] is a 67yoF who is referred to OPPT from orthopedics for neck pain s/p left (upper )trapezius strain. Pt familiar to our services from recent PT for BLE strength and balance July 2024. Since then pt reports increased back, knee and neck pain with increased soreness at the end of work day at [employer]. MD note also remarks on DJD of shoulder and cervical spine, recommendation for conservative management with muscle relaxer and NSAID therapies. Planning for FU at 6 weeks.   PAIN:  Are you having pain? No pain in neck today. Low back pain 10/10.   PRECAUTIONS: None and Other: wearing back brace for comfort   RED FLAGS: None     WEIGHT BEARING RESTRICTIONS: No  FALLS:  Has patient fallen in last 6 months? No  LIVING ENVIRONMENT: Lives with: lives alone Lives in: House/apartment Stairs: Yes: External: 5 steps; on right going up Has following equipment at home: Walker - 2 wheeled and does not use   OCCUPATION: food set up and delivery   PLOF: Independent  PATIENT GOALS: get back stronger  NEXT MD VISIT: sept 19, [place] visit     OBJECTIVE:    TODAY'S TREATMENT:                                                                                                                              DATE: 06/01/2023   lateral flexion/UT stretch 2x 30 sec bil  Seated row with GTB 3 x 10 reps  Seated AROM scapular retractionx 12 Open book stretch x 10 ea side  Supine B shoulder ER 2 x 15 reps with GTB  mc[employer]ill Curl up 2 x 10 reps -cues for appropriate positioning and form   Manual UT stretch L x 45 sec with GH depression  Cervical lateral flexion 2 x 45 sec ea side  Cervical rotation  2x 30 sec hold at end range  L UT TP release and STM x 4 min   PATIENT EDUCATION:  Education details: HEP education Benefits for deep core activation to improve spinal stability and reduce compensatory movement strategies.    HOME EXERCISE PROGRAM: Access Code: [email or web address] URL: [email or web address] Prepared by: Grier Rocher  Exercises - Seated Upper Trapezius Stretch  - 7 x weekly - 1 x daily - 3 sets - 10 reps - Seated Levator Scapulae Stretch  - 7 x weekly - 1 x daily - 3 sets - 10 reps  Access Code: [email or web address] URL: [email or web address] Date: 05/01/2023 Prepared by: Alvera Novel  Exercises - Seated Scapular Retraction  - 2 x daily - 2 sets - 15 reps - 3sec hold - Supine Cervical Retraction with Towel  - 2 x daily -  2 sets - 15 reps - 3sec hold - Supine Scapular Retraction  - 1 x daily - 7 x weekly - 3 sets - 10 reps - Supine Cervical Rotation PROM  - 2 x daily - 2 reps - 30 hold  ASSESSMENT:  CLINICAL IMPRESSION:  Continued with current plan of care as laid out in evaluation and recent prior sessions. Pt showing signs of improved cervical Rom this date. Pt brings in order for low back pain and asks this be addressed at next session. Will plan to further evaluate low back next session and officially add to POC. Pt continues to demonstrate progress toward goals AEB progression of interventions this  date either in volume or intensity.   OBJECTIVE IMPAIRMENTS: decreased ROM, decreased strength, hypomobility, increased fascial restrictions, increased muscle spasms, impaired flexibility, improper body mechanics, postural dysfunction, and pain.   ACTIVITY LIMITATIONS: carrying, lifting, standing, squatting, sleeping, reach over head, and hygiene/grooming  PARTICIPATION LIMITATIONS: laundry, driving, shopping, community activity, and occupation  PERSONAL FACTORS: Age, Behavior pattern, Education, Fitness, Past/current experiences, and 1 comorbidity: LBP knee pain  are also affecting patient's functional outcome.   REHAB POTENTIAL: Good  CLINICAL DECISION MAKING: Stable/uncomplicated  EVALUATION COMPLEXITY: Moderate   GOALS: Goals reviewed with patient? Yes  SHORT TERM GOALS: Target date: 05/31/2023  Pt will be independent with HEP to address cervical stiffness/pain without assist from PT Baseline: to be given at session 2.   Goal status: pending reassessment   LONG TERM GOALS: Target date: 06/22/2023   Pt will increased FOTO to >66 to indicate improved function and reduced impairment from cervical pain  Baseline: 60 Goal status: pending reassessment   2.  Pt will improve NDI by at least 5 pts to demonstrate improved function with ADLs and reduced disability as result of neck pain.  Baseline: 7/50 Goal status: pending reassessment   3.  Pt will improve cervical rotation to at least 55 deg to improve safety with driving Bil  Baseline: 45 L, 52 R  Goal status: pending reassessment   4.  Pt will increase lateral flexion in Cspine by at least 15 deg on the L to improve safety and reduced pain with work tasks of moving and preparing food at [employer]  Baseline: L 8 deg Goal status: pending reassessment   5.  Pt will report pain in neck 2/10 at worst to improve QoL and improve tolerance to work related tasks  Baseline: 6/10  Goal status: pending reassessment       PLAN:  PT FREQUENCY: 1-2x/week  PT DURATION: 8 weeks  PLANNED INTERVENTIONS: Therapeutic exercises, Therapeutic activity, Neuromuscular re-education, Balance training, Gait training, Patient/Family education, Self Care, Joint mobilization, Joint manipulation, Stair training, Dry Needling, Electrical stimulation, Spinal manipulation, Spinal mobilization, Cryotherapy, Moist heat, scar mobilization, Taping, Traction, Ultrasound, Ionotophoresis 4mg /ml Dexamethasone, Manual therapy, and Re-evaluation  PLAN FOR NEXT SESSION:  Low back eval and add to goals/ POC ( pt brought order in for low back)   Postural muscle activation. Cervical ROM and manual for tension release. Improved deep core activation    Norman Herrlich PT ,DPT Physical Therapist- [place]  [facility]  3:16 PM 06/01/23

## 2023-06-02 ENCOUNTER — Telehealth: Payer: Self-pay

## 2023-06-02 NOTE — Telephone Encounter (Signed)
Per pt phone call need  to reschedule procedure.

## 2023-06-02 NOTE — Telephone Encounter (Signed)
Called patient and informed patient we could reschedule one more time but after that we would not be able to reschedule again. Rescheduled to 07/05/2023. Called endo and talk to [facility] and she will reschedule the procedure. Sent out new instructions to patient

## 2023-06-05 ENCOUNTER — Encounter: Payer: 59 | Admitting: Physical Therapy

## 2023-06-07 ENCOUNTER — Encounter: Payer: 59 | Admitting: Physical Therapy

## 2023-06-08 ENCOUNTER — Ambulatory Visit: Payer: 59 | Admitting: Physical Therapy

## 2023-06-08 DIAGNOSIS — M542 Cervicalgia: Secondary | ICD-10-CM

## 2023-06-08 DIAGNOSIS — M5459 Other low back pain: Secondary | ICD-10-CM

## 2023-06-08 DIAGNOSIS — R2681 Unsteadiness on feet: Secondary | ICD-10-CM | POA: Diagnosis not present

## 2023-06-08 DIAGNOSIS — M6281 Muscle weakness (generalized): Secondary | ICD-10-CM

## 2023-06-08 NOTE — Therapy (Signed)
OUTPATIENT PHYSICAL THERAPY CERVICAL DISCHARGE / LUMBAR RE- EVAL    Patient Name: [name] [name] MRN: [id number] DOB:1956-04-03, 67 y.o., female Today's Date: 06/08/2023  END OF SESSION:  PT End of Session - 06/08/23 1534     Visit Number 8    Number of Visits 16    Date for PT Re-Evaluation 08/03/23    Authorization Type UHC    Progress Note Due on Visit 18    PT Start Time 1530    PT Stop Time 1613    PT Time Calculation (min) 43 min    Equipment Utilized During Treatment Gait belt    Activity Tolerance Patient tolerated treatment well;No increased pain    Behavior During Therapy WFL for tasks assessed/performed              Past Medical History:  Diagnosis Date   Actinic keratosis    Acute hepatitis C without mention of hepatic coma(070.51)    Arthritis    fingers   Basal cell carcinoma 12/13/2022   left upper abdomen, EDC   Carpal tunnel syndrome of left wrist    Depression    GERD (gastroesophageal reflux disease)    Hypothyroidism    Insomnia    Insomnia    Memory loss    Osteoporosis    SCC (squamous cell carcinoma) 03/06/2023   left lower lateral leg, refer for Mohs   Squamous cell carcinoma of skin 12/27/2021   Left pretibial - EDC   Tremor    Wears dentures    full upper, partial lower   Past Surgical History:  Procedure Laterality Date   ANKLE SURGERY Right    BLADDER TACT     CATARACT EXTRACTION W/PHACO Left 10/13/2021   Procedure: CATARACT EXTRACTION PHACO AND INTRAOCULAR LENS PLACEMENT (IOC) LEFT;  Surgeon: Lockie Mola, MD;  Location: [facility];  Service: Ophthalmology;  Laterality: Left;  3.97 00:49.0   CATARACT EXTRACTION W/PHACO Right 10/27/2021   Procedure: CATARACT EXTRACTION PHACO AND INTRAOCULAR LENS PLACEMENT (IOC) RIGHT 4.50 00:58.9;  Surgeon: Lockie Mola, MD;  Location: [facility];  Service: Ophthalmology;  Laterality: Right;   HAND SURGERY Right    TONSILLECTOMY     Patient Active Problem List    Diagnosis Date Noted   Hepatitis C 08/19/2016   Depression 08/19/2016   Insomnia 08/19/2016   Hypothyroidism 08/19/2016    PCP:   Salena Saner, MD    REFERRING PROVIDER:   Altamese Cabal, PA-C    REFERRING DIAG:  Diagnosis  M54.2 (ICD-10-CM) - Neck pain    THERAPY DIAG:  Neck pain  Other low back pain  Muscle weakness (generalized)  Rationale for Evaluation and Treatment: Rehabilitation  ONSET DATE: 5 months   SUBJECTIVE:  SUBJECTIVE STATEMENT: Pt reports low back pain that began approximately 3 weeks ago. Pt reports the pain is exacerbated by bending even by just wiping off of a table. Pt had no history of back pain prior. Pt reports pain in the lower thoracic/ upper lumbar spine.   Hand dominance: Right  PERTINENT HISTORY:   [name] [name] is a 67yoF who is referred to OPPT from orthopedics for neck pain s/p left (upper) trapezius strain. Pt familiar to our services from recent PT for BLE strength and balance July 2024. Since then pt reports increased back, knee and neck pain with increased soreness at the end of work day at [employer]. MD note also remarks on DJD of shoulder and cervical spine, recommendation for conservative management with muscle relaxer and NSAID therapies. Planning for FU at 6 weeks.   PAIN:  Are you having pain?  0/10 current  7/10 wiping off table  7/10 bending to pick something up  2/10 lying down.  Description: aching    PRECAUTIONS: None and Other: wearing back brace for comfort   RED FLAGS: None     WEIGHT BEARING RESTRICTIONS: No  FALLS:  Has patient fallen in last 6 months? No  LIVING ENVIRONMENT: Lives with: lives alone Lives in: House/apartment Stairs: Yes: External: 5 steps; on right going up Has following  equipment at home: Walker - 2 wheeled and does not use   OCCUPATION: food set up and delivery   PLOF: Independent  PATIENT GOALS: get back stronger  NEXT MD VISIT: sept 19, [place] visit    OBJECTIVE:  LUMBAR ROM:  Active  A/PROM  eval  Flexion Hands to knees   Extension Min movement   Right lateral flexion WNL  Left lateral flexion WNL  Right rotation Limited, no pain  Left rotation Limited, no pain   (Blank rows = not tested)  MMT grades Right Left  Hip flexion 4+ 4+  Hip Abduction 4+ 4+  Hip Adduction    Knee Extension     Knee Flexion    DF    PF     Special tests   HS length 35 on R, 16 on L Piriformis, tightness on the L  DLLT 75 degrees caused pain for remainder  TODAY'S TREATMENT:                                                                                                                              DATE: 06/08/2023  D/C from Neck pain and evaluation for low back pain see above test performed for lumbar for reevaluation findings  PATIENT EDUCATION:  Education details: HEP education Benefits for deep core activation to improve spinal stability and reduce compensatory movement strategies.    HOME EXERCISE PROGRAM: Access Code: [email or web address] URL: [email or web address] Prepared by: Grier Rocher  Exercises - Seated Upper Trapezius Stretch  - 7 x weekly - 1 x daily - 3 sets - 10 reps - Seated Levator Scapulae Stretch  -  7 x weekly - 1 x daily - 3 sets - 10 reps  Access Code: [email or web address] URL: [email or web address] Date: 05/01/2023 Prepared by: Alvera Novel  Exercises - Seated Scapular Retraction  - 2 x daily - 2 sets - 15 reps - 3sec hold - Supine Cervical Retraction with Towel  - 2 x daily - 2 sets - 15 reps - 3sec hold - Supine Scapular Retraction  - 1 x daily - 7 x weekly - 3 sets - 10 reps - Supine Cervical Rotation PROM  - 2 x daily - 2 reps - 30 hold  ASSESSMENT:  CLINICAL IMPRESSION:  Patient presents to physical  therapy for discharge summary for her cervical treatment as well as for evaluation for her lumbar and lower back pain.  Patient presents with tenderness to palpation along her bilateral paraspinal starting at her lower thoracic imaging all the way into her lower lumbar muscles.  Patient also presents with weakness in her core and abdominal muscles likely leading to increased stress on her lumbar paraspinals causing the pain she is experiencing this is evidenced by double leg lowering test causing exacerbation of lower back pain.  Patient will benefit from skilled physical therapy to improve her low back pain, improve her lower extremity tightness, and to improve her overall quality of life.  OBJECTIVE IMPAIRMENTS: decreased ROM, decreased strength, hypomobility, increased fascial restrictions, increased muscle spasms, impaired flexibility, improper body mechanics, postural dysfunction, and pain.   ACTIVITY LIMITATIONS: carrying, lifting, standing, squatting, sleeping, reach over head, and hygiene/grooming  PARTICIPATION LIMITATIONS: laundry, driving, shopping, community activity, and occupation  PERSONAL FACTORS: Age, Behavior pattern, Education, Fitness, Past/current experiences, and 1 comorbidity: LBP knee pain  are also affecting patient's functional outcome.   REHAB POTENTIAL: Good  CLINICAL DECISION MAKING: Stable/uncomplicated  EVALUATION COMPLEXITY: Moderate   GOALS: Goals reviewed with patient? Yes  SHORT TERM GOALS: Target date: 05/31/2023  Pt will be independent with HEP to address cervical stiffness/pain without assist from PT Baseline: to be given at session 2.  10/24: been completing, pain has been improved   Goal status: pending reassessment   LONG TERM GOALS: Target date: 06/22/2023   Pt will increased FOTO to >66 to indicate improved function and reduced impairment from cervical pain  Baseline: 60 10/24:66 Goal status: Met goal for cervical F OTO  2.  Pt will improve  NDI by at least 5 pts to demonstrate improved function with ADLs and reduced disability as result of neck pain.  Baseline: 7/50 10/24: 5/50 Goal status: partially met  3.  Pt will improve cervical rotation to at least 55 deg to improve safety with driving Bil  Baseline: 45 L, 52 R  Goal status: Met  4.  Pt will increase lateral flexion in Cspine by at least 15 deg on the L to improve safety and reduced pain with work tasks of moving and preparing food at [employer]  Baseline: L 8 deg L 25 R 35 Goal status: MET   5.  Pt will report pain in neck 2/10 at worst to improve QoL and improve tolerance to work related tasks  Baseline: 6/10  10/24:  No neck pain in last several days  Goal status:MET  6.  Patient will improve modified Oswestry back pain disability questionnaire by 5 points or greater to indicate improved subjective level of back pain with functional activities Baseline: 10/50 Goal status: INITIAL 7.  Patient will improve double leg limb lowering test by 20 degrees or greater indicating improved spinal stability  as well as improved core muscle activation. Baseline: 75 degrees ( 15 form 90) Goal status: INITIAL  8.  Patient will report improved back pain as evidenced by pain level of 3 of 10 or less on visual analog pain scale in order to indicate improved quality of life Baseline: 7/10 when bending or stooping  Goal status: INITIAL    PLAN:  PT FREQUENCY: 1-2x/week  PT DURATION: 8 weeks  PLANNED INTERVENTIONS: Therapeutic exercises, Therapeutic activity, Neuromuscular re-education, Balance training, Gait training, Patient/Family education, Self Care, Joint mobilization, Joint manipulation, Stair training, Dry Needling, Electrical stimulation, Spinal manipulation, Spinal mobilization, Cryotherapy, Moist heat, scar mobilization, Taping, Traction, Ultrasound, Ionotophoresis 4mg /ml Dexamethasone, Manual therapy, and Re-evaluation  PLAN FOR NEXT SESSION:  Deep core  activation, lower back STM as indicated, hip strength as indicated    Norman Herrlich PT ,DPT Physical Therapist- [place]  [facility]  5:31 PM 06/08/23

## 2023-06-12 ENCOUNTER — Encounter: Payer: 59 | Admitting: Physical Therapy

## 2023-06-14 ENCOUNTER — Encounter: Payer: 59 | Admitting: Physical Therapy

## 2023-06-15 ENCOUNTER — Ambulatory Visit: Payer: 59 | Admitting: Physical Therapy

## 2023-06-15 ENCOUNTER — Encounter: Payer: Self-pay | Admitting: Physical Therapy

## 2023-06-15 DIAGNOSIS — M5459 Other low back pain: Secondary | ICD-10-CM

## 2023-06-15 DIAGNOSIS — M6281 Muscle weakness (generalized): Secondary | ICD-10-CM

## 2023-06-15 DIAGNOSIS — R2681 Unsteadiness on feet: Secondary | ICD-10-CM | POA: Diagnosis not present

## 2023-06-15 NOTE — Therapy (Signed)
OUTPATIENT PHYSICAL THERAPY CERVICAL DISCHARGE / LUMBAR RE- EVAL    Patient Name: [name] [name] MRN: [id number] DOB:[birth year], 67 y.o., female Today's Date: 06/15/2023  END OF SESSION:  PT End of Session - 06/15/23 1534     Visit Number 9    Number of Visits 16    Date for PT Re-Evaluation 08/03/23    Authorization Type [employer]    Progress Note Due on Visit 18    PT Start Time 1534    PT Stop Time 1614    PT Time Calculation (min) 40 min    Equipment Utilized During Treatment Gait belt    Activity Tolerance Patient tolerated treatment well;No increased pain    Behavior During Therapy WFL for tasks assessed/performed              Past Medical History:  Diagnosis Date   Actinic keratosis    Acute hepatitis C without mention of hepatic coma(070.51)    Arthritis    fingers   Basal cell carcinoma 12/13/2022   left upper abdomen, EDC   Carpal tunnel syndrome of left wrist    Depression    GERD (gastroesophageal reflux disease)    Hypothyroidism    Insomnia    Insomnia    Memory loss    Osteoporosis    SCC (squamous cell carcinoma) 03/06/2023   left lower lateral leg, refer for Mohs   Squamous cell carcinoma of skin 12/27/2021   Left pretibial - EDC   Tremor    Wears dentures    full upper, partial lower   Past Surgical History:  Procedure Laterality Date   ANKLE SURGERY Right    BLADDER TACT     CATARACT EXTRACTION W/PHACO Left 10/13/2021   Procedure: CATARACT EXTRACTION PHACO AND INTRAOCULAR LENS PLACEMENT (IOC) LEFT;  Surgeon: Lockie Mola, MD;  Location: [facility];  Service: Ophthalmology;  Laterality: Left;  3.97 00:49.0   CATARACT EXTRACTION W/PHACO Right 10/27/2021   Procedure: CATARACT EXTRACTION PHACO AND INTRAOCULAR LENS PLACEMENT (IOC) RIGHT 4.50 00:58.9;  Surgeon: Lockie Mola, MD;  Location: [facility];  Service: Ophthalmology;  Laterality: Right;   HAND SURGERY Right    TONSILLECTOMY     Patient Active Problem List    Diagnosis Date Noted   Hepatitis C 08/19/2016   Depression 08/19/2016   Insomnia 08/19/2016   Hypothyroidism 08/19/2016    PCP:   Salena Saner, MD    REFERRING PROVIDER:   Altamese Cabal, PA-C    REFERRING DIAG:  Diagnosis  M54.2 (ICD-10-CM) - Neck pain    THERAPY DIAG:  Neck pain  Unsteadiness on feet  Other abnormalities of gait and mobility  Other low back pain  Muscle weakness (generalized)  Other lack of coordination  Rationale for Evaluation and Treatment: Rehabilitation  ONSET DATE: 5 months   SUBJECTIVE:  SUBJECTIVE STATEMENT: Pt reported that she is experiencing some low back pain today. Pt reported 3/10 on NPS in lower back.    Hand dominance: Right  PERTINENT HISTORY:   [name] [name] is a 67yoF who is referred to OPPT from orthopedics for neck pain s/p left (upper) trapezius strain. Pt familiar to our services from recent PT for BLE strength and balance July 2024. Since then pt reports increased back, knee and neck pain with increased soreness at the end of work day at [employer]. MD note also remarks on DJD of shoulder and cervical spine, recommendation for conservative management with muscle relaxer and NSAID therapies. Planning for FU at 6 weeks.   PAIN:  Are you having pain?  0/10 current  7/10 wiping off table  7/10 bending to pick something up  2/10 lying down.  Description: aching    PRECAUTIONS: None and Other: wearing back brace for comfort   RED FLAGS: None     WEIGHT BEARING RESTRICTIONS: No  FALLS:  Has patient fallen in last 6 months? No  LIVING ENVIRONMENT: Lives with: lives alone Lives in: House/apartment Stairs: Yes: External: 5 steps; on right going up Has following equipment at home: Walker - 2 wheeled and  does not use   OCCUPATION: food set up and delivery   PLOF: Independent  PATIENT GOALS: get back stronger  NEXT MD VISIT: sept 19, [place] visit    OBJECTIVE: (objective measures completed at evaluation of the lumbar spine on 10/24 unless otherwise dated)  LUMBAR ROM:  Active  A/PROM  eval  Flexion Hands to knees   Extension Min movement   Right lateral flexion WNL  Left lateral flexion WNL  Right rotation Limited, no pain  Left rotation Limited, no pain   (Blank rows = not tested)  MMT grades Right Left  Hip flexion 4+ 4+  Hip Abduction 4+ 4 (on 10/31 in [place])  Hip Adduction    Knee Extension     Knee Flexion    DF    PF     Special tests   HS length 35 on R, 16 on L Piriformis, tightness on the L  DLLT 75 degrees caused pain for remainder  TODAY'S TREATMENT:                                                                                                                              DATE: 06/15/2023  TA:  PROM Hamstring stretch LLE 1x45 seconds   PROM piriformis stretch LLE 2x45 seconds Figure 4 stretch x 45 sec LLE   TE: Lower back rotational stretch 10x10 seconds each side Supine glute bridges with RTB around knees 2x8 Clamshells LLE GTB 2x10 TA Activation 15 reps of 10 second holds  Magill curl up without cervical flexion 10 reps Magill curl up 10 reps SLR 10 reps each LE      Palpation:  Pain along left transverse process of L3 vertebrae   Manual  Therapy:  IASTM along left lower back along origin of glutes and along QL  PATIENT EDUCATION:  Education details: HEP education Benefits for deep core activation to improve spinal stability and reduce compensatory movement strategies.    HOME EXERCISE PROGRAM: Access Code: [email or web address] URL: [email or web address] Date: 06/15/2023 Prepared by: Thresa Ross  Exercises - Supine Lower Trunk Rotation  - 1 x daily - 7 x weekly - 10 reps - 10 sec  hold - Supine Bridge with Resistance Band  - 1 x  daily - 7 x weekly - 2 sets - 8 reps - Supine Transversus Abdominis Bracing - Hands on Stomach  - 1 x daily - 7 x weekly - 3 sets - 10 reps - Supine Straight Leg Raises  - 1 x daily - 7 x weekly - 2 sets - 8 reps  ASSESSMENT:  CLINICAL IMPRESSION:  Pt was able to tolerate all tasks during today's session. Pt reported improvement in low back pain after IASTM. Pt was administered updated HEP that included there ex from today's visit. Pt presented with tenderness along left lower back, which is why most interventions were targeted towards that specific area during today's session. Patient will benefit from skilled physical therapy to improve her low back pain, improve her lower extremity tightness, and to improve her overall quality of life.  OBJECTIVE IMPAIRMENTS: decreased ROM, decreased strength, hypomobility, increased fascial restrictions, increased muscle spasms, impaired flexibility, improper body mechanics, postural dysfunction, and pain.   ACTIVITY LIMITATIONS: carrying, lifting, standing, squatting, sleeping, reach over head, and hygiene/grooming  PARTICIPATION LIMITATIONS: laundry, driving, shopping, community activity, and occupation  PERSONAL FACTORS: Age, Behavior pattern, Education, Fitness, Past/current experiences, and 1 comorbidity: LBP knee pain  are also affecting patient's functional outcome.   REHAB POTENTIAL: Good  CLINICAL DECISION MAKING: Stable/uncomplicated  EVALUATION COMPLEXITY: Moderate   GOALS: Goals reviewed with patient? Yes  SHORT TERM GOALS: Target date: 05/31/2023  Pt will be independent with HEP to address cervical stiffness/pain without assist from PT Baseline: to be given at session 2.  10/24: been completing, pain has been improved   Goal status: pending reassessment   LONG TERM GOALS: Target date: 06/22/2023   Pt will increased FOTO to >66 to indicate improved function and reduced impairment from cervical pain  Baseline: 60 10/24:66 Goal  status: Met goal for cervical F OTO  2.  Pt will improve NDI by at least 5 pts to demonstrate improved function with ADLs and reduced disability as result of neck pain.  Baseline: 7/50 10/24: 5/50 Goal status: partially met  3.  Pt will improve cervical rotation to at least 55 deg to improve safety with driving Bil  Baseline: 45 L, 52 R  Goal status: Met  4.  Pt will increase lateral flexion in Cspine by at least 15 deg on the L to improve safety and reduced pain with work tasks of moving and preparing food at [employer]  Baseline: L 8 deg L 25 R 35 Goal status: MET   5.  Pt will report pain in neck 2/10 at worst to improve QoL and improve tolerance to work related tasks  Baseline: 6/10  10/24:  No neck pain in last several days  Goal status:MET  6.  Patient will improve modified Oswestry back pain disability questionnaire by 5 points or greater to indicate improved subjective level of back pain with functional activities Baseline: 10/50 Goal status: INITIAL 7.  Patient will improve double leg limb lowering test by 20 degrees or greater  indicating improved spinal stability as well as improved core muscle activation. Baseline: 75 degrees ( 15 form 90) Goal status: INITIAL  8.  Patient will report improved back pain as evidenced by pain level of 3 of 10 or less on visual analog pain scale in order to indicate improved quality of life Baseline: 7/10 when bending or stooping  Goal status: INITIAL    PLAN:  PT FREQUENCY: 1-2x/week  PT DURATION: 8 weeks  PLANNED INTERVENTIONS: Therapeutic exercises, Therapeutic activity, Neuromuscular re-education, Balance training, Gait training, Patient/Family education, Self Care, Joint mobilization, Joint manipulation, Stair training, Dry Needling, Electrical stimulation, Spinal manipulation, Spinal mobilization, Cryotherapy, Moist heat, scar mobilization, Taping, Traction, Ultrasound, Ionotophoresis 4mg /ml Dexamethasone, Manual therapy, and  Re-evaluation  PLAN FOR NEXT SESSION:  Deep core activation, lower back STM as indicated, hip strength as indicated   Debara Pickett, SPT    Norman Herrlich PT ,DPT Physical Therapist- [place]  [facility]  4:16 PM 06/15/23

## 2023-06-19 ENCOUNTER — Encounter: Payer: 59 | Admitting: Physical Therapy

## 2023-06-19 ENCOUNTER — Ambulatory Visit: Payer: 59 | Attending: Orthopedic Surgery | Admitting: Physical Therapy

## 2023-06-19 DIAGNOSIS — M5459 Other low back pain: Secondary | ICD-10-CM | POA: Insufficient documentation

## 2023-06-19 DIAGNOSIS — M542 Cervicalgia: Secondary | ICD-10-CM | POA: Insufficient documentation

## 2023-06-19 DIAGNOSIS — R2689 Other abnormalities of gait and mobility: Secondary | ICD-10-CM | POA: Diagnosis present

## 2023-06-19 DIAGNOSIS — R278 Other lack of coordination: Secondary | ICD-10-CM | POA: Diagnosis present

## 2023-06-19 DIAGNOSIS — R2681 Unsteadiness on feet: Secondary | ICD-10-CM | POA: Insufficient documentation

## 2023-06-19 DIAGNOSIS — M6281 Muscle weakness (generalized): Secondary | ICD-10-CM | POA: Diagnosis present

## 2023-06-19 NOTE — Therapy (Signed)
OUTPATIENT PHYSICAL THERAPY CERVICAL DISCHARGE / LUMBAR treatment   PHYSICAL THERAPY PROGRESS NOTE   Dates of reporting period  06/08/2023   to   06/19/2023      Patient Name: [name] [name] MRN: [id number] DOB:05/24/1956, 67 y.o., female Today's Date: 06/19/2023  END OF SESSION:  PT End of Session - 06/19/23 1354     Visit Number 10    Number of Visits 16    Date for PT Re-Evaluation 08/03/23    Authorization Type U[employer]    Progress Note Due on Visit 18    PT Start Time 1403    PT Stop Time 1445    PT Time Calculation (min) 42 min    Equipment Utilized During Treatment Gait belt    Activity Tolerance Patient tolerated treatment well;No increased pain    Behavior During Therapy WFL for tasks assessed/performed              Past Medical History:  Diagnosis Date   Actinic keratosis    Acute hepatitis C without mention of hepatic coma(070.51)    Arthritis    fingers   Basal cell carcinoma 12/13/2022   left upper abdomen, EDC   Carpal tunnel syndrome of left wrist    Depression    GERD (gastroesophageal reflux disease)    Hypothyroidism    Insomnia    Insomnia    Memory loss    Osteoporosis    SCC (squamous cell carcinoma) 03/06/2023   left lower lateral leg, refer for Mohs   Squamous cell carcinoma of skin 12/27/2021   Left pretibial - EDC   Tremor    Wears dentures    full upper, partial lower   Past Surgical History:  Procedure Laterality Date   ANKLE SURGERY Right    BLADDER TACT     CATARACT EXTRACTION W/PHACO Left 10/13/2021   Procedure: CATARACT EXTRACTION PHACO AND INTRAOCULAR LENS PLACEMENT (IOC) LEFT;  Surgeon: Lockie Mola, MD;  Location: [facility];  Service: Ophthalmology;  Laterality: Left;  3.97 00:49.0   CATARACT EXTRACTION W/PHACO Right 10/27/2021   Procedure: CATARACT EXTRACTION PHACO AND INTRAOCULAR LENS PLACEMENT (IOC) RIGHT 4.50 00:58.9;  Surgeon: Lockie Mola, MD;  Location: [facility];  Service:  Ophthalmology;  Laterality: Right;   HAND SURGERY Right    TONSILLECTOMY     Patient Active Problem List   Diagnosis Date Noted   Hepatitis C 08/19/2016   Depression 08/19/2016   Insomnia 08/19/2016   Hypothyroidism 08/19/2016    PCP:   Salena Saner, MD    REFERRING PROVIDER:   Altamese Cabal, PA-C    REFERRING DIAG:  Diagnosis  M54.2 (ICD-10-CM) - Neck pain    THERAPY DIAG:  Other low back pain  Muscle weakness (generalized)  Neck pain  Unsteadiness on feet  Other abnormalities of gait and mobility  Other lack of coordination  Rationale for Evaluation and Treatment: Rehabilitation  ONSET DATE: 5 months   SUBJECTIVE:  SUBJECTIVE STATEMENT: Pt reports to PT with no pain at start of PT session. No pain while working this morning.   Hand dominance: Right  PERTINENT HISTORY:   [name] [name] is a 67yoF who is referred to OPPT from orthopedics for neck pain s/p left (upper) trapezius strain. Pt familiar to our services from recent PT for BLE strength and balance July 2024. Since then pt reports increased back, knee and neck pain with increased soreness at the end of work day at [employer]. MD note also remarks on DJD of shoulder and cervical spine, recommendation for conservative management with muscle relaxer and NSAID therapies. Planning for FU at 6 weeks.   PAIN:  Are you having pain?  0/10 current  Description: aching    PRECAUTIONS: None and Other: wearing back brace for comfort   RED FLAGS: None     WEIGHT BEARING RESTRICTIONS: No  FALLS:  Has patient fallen in last 6 months? No  LIVING ENVIRONMENT: Lives with: lives alone Lives in: House/apartment Stairs: Yes: External: 5 steps; on right going up Has following equipment at home: Walker -  2 wheeled and does not use   OCCUPATION: food set up and delivery   PLOF: Independent  PATIENT GOALS: get back stronger  NEXT MD VISIT: sept 19, [place] visit    OBJECTIVE: (objective measures completed at evaluation of the lumbar spine on 10/24 unless otherwise dated)  LUMBAR ROM:  Active  A/PROM  eval  Flexion Hands to knees   Extension Min movement   Right lateral flexion WNL  Left lateral flexion WNL  Right rotation Limited, no pain  Left rotation Limited, no pain   (Blank rows = not tested)  MMT grades Right Left  Hip flexion 4+ 4+  Hip Abduction 4+ 4 (on 10/31 in [place])  Hip Adduction    Knee Extension     Knee Flexion    DF    PF     Special tests   HS length 35 on R, 16 on L Piriformis, tightness on the L  DLLT 75 degrees caused pain for remainder  TODAY'S TREATMENT:                                                                                                                              DATE: 06/19/2023  Supine/sidelying  PROM Hamstring stretch LLE 2x45 seconds   PROM piriformis stretch LLE 2x45 seconds Figure 4 stretch x 45 sec LLE  Hip flexor PROM stretch 2 x 45 sec   TE: Lower back rotational stretch 12x5 seconds each side Supine glute bridges with isometric hip adduction 2x10 Clamshells sitting EOB x 10 manual resistance Attempted TA Activation x 5 reps, but poor body awareness limits success  Mcgill curl up 10 reps Posterior pelvic tilt x 10  SLR 10 reps each LE        PATIENT EDUCATION:  Education details: HEP education Benefits for deep core activation to improve spinal  stability and reduce compensatory movement strategies.    HOME EXERCISE PROGRAM: Access Code: [email or web address] URL: [email or web address] Date: 06/15/2023 Prepared by: Thresa Ross  Exercises - Supine Lower Trunk Rotation  - 1 x daily - 7 x weekly - 10 reps - 10 sec  hold - Supine Bridge with Resistance Band  - 1 x daily - 7 x weekly - 2 sets - 8 reps - Supine  Transversus Abdominis Bracing - Hands on Stomach  - 1 x daily - 7 x weekly - 3 sets - 10 reps - Supine Straight Leg Raises  - 1 x daily - 7 x weekly - 2 sets - 8 reps  ASSESSMENT:  CLINICAL IMPRESSION:  PT progress note: re-eval for LBP and visit count re-started 2 visits prior; refer to re-eval note on 10/24.  Able to tolerate all tasks during today's session. Reduced pain allowed PT treatment to focus on improved muscle extensibility and deep core muscle activation.  Pt performed increased repetitions with reduced pain compared to prior session. Patient will benefit from skilled physical therapy to improve her low back pain, improve her lower extremity tightness, and to improve her overall quality of life. Patient's condition has the potential to improve in response to therapy. Maximum improvement is yet to be obtained. The anticipated improvement is attainable and reasonable in a generally predictable time.   OBJECTIVE IMPAIRMENTS: decreased ROM, decreased strength, hypomobility, increased fascial restrictions, increased muscle spasms, impaired flexibility, improper body mechanics, postural dysfunction, and pain.   ACTIVITY LIMITATIONS: carrying, lifting, standing, squatting, sleeping, reach over head, and hygiene/grooming  PARTICIPATION LIMITATIONS: laundry, driving, shopping, community activity, and occupation  PERSONAL FACTORS: Age, Behavior pattern, Education, Fitness, Past/current experiences, and 1 comorbidity: LBP knee pain  are also affecting patient's functional outcome.   REHAB POTENTIAL: Good  CLINICAL DECISION MAKING: Stable/uncomplicated  EVALUATION COMPLEXITY: Moderate   GOALS: Goals reviewed with patient? Yes  SHORT TERM GOALS: Target date: 05/31/2023  Pt will be independent with HEP to address cervical stiffness/pain without assist from PT Baseline: to be given at session 2.  10/24: been completing, pain has been improved   Goal status: pending reassessment    LONG TERM GOALS: Target date: 06/22/2023   Pt will increased FOTO to >66 to indicate improved function and reduced impairment from cervical pain  Baseline: 60 10/24:66 Goal status: Met goal for cervical F OTO  2.  Pt will improve NDI by at least 5 pts to demonstrate improved function with ADLs and reduced disability as result of neck pain.  Baseline: 7/50 10/24: 5/50 Goal status: partially met  3.  Pt will improve cervical rotation to at least 55 deg to improve safety with driving Bil  Baseline: 45 L, 52 R  Goal status: Met  4.  Pt will increase lateral flexion in Cspine by at least 15 deg on the L to improve safety and reduced pain with work tasks of moving and preparing food at [employer]  Baseline: L 8 deg L 25 R 35 Goal status: MET   5.  Pt will report pain in neck 2/10 at worst to improve QoL and improve tolerance to work related tasks  Baseline: 6/10  10/24:  No neck pain in last several days  Goal status:MET  6.  Patient will improve modified Oswestry back pain disability questionnaire by 5 points or greater to indicate improved subjective level of back pain with functional activities Baseline: 10/50 Goal status: INITIAL 7.  Patient will improve double leg limb lowering test by  20 degrees or greater indicating improved spinal stability as well as improved core muscle activation. Baseline: 75 degrees ( 15 form 90) Goal status: INITIAL  8.  Patient will report improved back pain as evidenced by pain level of 3 of 10 or less on visual analog pain scale in order to indicate improved quality of life Baseline: 7/10 when bending or stooping  Goal status: INITIAL    PLAN:  PT FREQUENCY: 1-2x/week  PT DURATION: 8 weeks  PLANNED INTERVENTIONS: Therapeutic exercises, Therapeutic activity, Neuromuscular re-education, Balance training, Gait training, Patient/Family education, Self Care, Joint mobilization, Joint manipulation, Stair training, Dry Needling, Electrical  stimulation, Spinal manipulation, Spinal mobilization, Cryotherapy, Moist heat, scar mobilization, Taping, Traction, Ultrasound, Ionotophoresis 4mg /ml Dexamethasone, Manual therapy, and Re-evaluation  PLAN FOR NEXT SESSION:  Deep core activation, lower back STM as indicated, hip strength as indicated   Grier Rocher PT, DPT  Physical Therapist - [place]  [facility]  2:50 PM 06/19/23

## 2023-06-20 ENCOUNTER — Ambulatory Visit: Payer: 59 | Admitting: Dermatology

## 2023-06-20 DIAGNOSIS — L570 Actinic keratosis: Secondary | ICD-10-CM

## 2023-06-20 DIAGNOSIS — L578 Other skin changes due to chronic exposure to nonionizing radiation: Secondary | ICD-10-CM | POA: Diagnosis not present

## 2023-06-20 DIAGNOSIS — W908XXA Exposure to other nonionizing radiation, initial encounter: Secondary | ICD-10-CM

## 2023-06-20 DIAGNOSIS — L821 Other seborrheic keratosis: Secondary | ICD-10-CM | POA: Diagnosis not present

## 2023-06-20 DIAGNOSIS — L814 Other melanin hyperpigmentation: Secondary | ICD-10-CM | POA: Diagnosis not present

## 2023-06-20 DIAGNOSIS — Z85828 Personal history of other malignant neoplasm of skin: Secondary | ICD-10-CM

## 2023-06-20 NOTE — Patient Instructions (Addendum)
Seborrheic Keratosis  What causes seborrheic keratoses? Seborrheic keratoses are harmless, common skin growths that first appear during adult life.  As time goes by, more growths appear.  Some people may develop a large number of them.  Seborrheic keratoses appear on both covered and uncovered body parts.  They are not caused by sunlight.  The tendency to develop seborrheic keratoses can be inherited.  They vary in color from skin-colored to gray, brown, or even black.  They can be either smooth or have a rough, warty surface.   Seborrheic keratoses are superficial and look as if they were stuck on the skin.  Under the microscope this type of keratosis looks like layers upon layers of skin.  That is why at times the top layer may seem to fall off, but the rest of the growth remains and re-grows.    Treatment Seborrheic keratoses do not need to be treated, but can easily be removed in the office.  Seborrheic keratoses often cause symptoms when they rub on clothing or jewelry.  Lesions can be in the way of shaving.  If they become inflamed, they can cause itching, soreness, or burning.  Removal of a seborrheic keratosis can be accomplished by freezing, burning, or surgery. If any spot bleeds, scabs, or grows rapidly, please return to have it checked, as these can be an indication of a skin cancer.  Actinic keratoses are precancerous spots that appear secondary to cumulative UV radiation exposure/sun exposure over time. They are chronic with expected duration over 1 year. A portion of actinic keratoses will progress to squamous cell carcinoma of the skin. It is not possible to reliably predict which spots will progress to skin cancer and so treatment is recommended to prevent development of skin cancer.  Recommend daily broad spectrum sunscreen SPF 30+ to sun-exposed areas, reapply every 2 hours as needed.  Recommend staying in the shade or wearing long sleeves, sun glasses (UVA+UVB protection) and  wide brim hats (4-inch brim around the entire circumference of the hat). Call for new or changing lesions.   Cryotherapy Aftercare  Wash gently with soap and water everyday.   Apply Vaseline and Band-Aid daily until healed.    Melanoma ABCDEs  Melanoma is the most dangerous type of skin cancer, and is the leading cause of death from skin disease.  You are more likely to develop melanoma if you: Have light-colored skin, light-colored eyes, or red or blond hair Spend a lot of time in the sun Tan regularly, either outdoors or in a tanning bed Have had blistering sunburns, especially during childhood Have a close family member who has had a melanoma Have atypical moles or large birthmarks  Early detection of melanoma is key since treatment is typically straightforward and cure rates are extremely high if we catch it early.   The first sign of melanoma is often a change in a mole or a new dark spot.  The ABCDE system is a way of remembering the signs of melanoma.  A for asymmetry:  The two halves do not match. B for border:  The edges of the growth are irregular. C for color:  A mixture of colors are present instead of an even brown color. D for diameter:  Melanomas are usually (but not always) greater than 6mm - the size of a pencil eraser. E for evolution:  The spot keeps changing in size, shape, and color.  Please check your skin once per month between visits. You can  use a small mirror in front and a large mirror behind you to keep an eye on the back side or your body.   If you see any new or changing lesions before your next follow-up, please call to schedule a visit.  Please continue daily skin protection including broad spectrum sunscreen SPF 30+ to sun-exposed areas, reapplying every 2 hours as needed when you're outdoors.   Staying in the shade or wearing long sleeves, sun glasses (UVA+UVB protection) and wide brim hats (4-inch brim around the entire circumference of the hat)  are also recommended for sun protection.    Due to recent changes in healthcare laws, you may see results of your pathology and/or laboratory studies on MyChart before the doctors have had a chance to review them. We understand that in some cases there may be results that are confusing or concerning to you. Please understand that not all results are received at the same time and often the doctors may need to interpret multiple results in order to provide you with the best plan of care or course of treatment. Therefore, we ask that you please give [place] 2 business days to thoroughly review all your results before contacting the office for clarification. Should we see a critical lab result, you will be contacted sooner.   If You Need Anything After Your Visit  If you have any questions or concerns for your doctor, please call our main line at [phone] and press option 4 to reach your doctor's medical assistant. If no one answers, please leave a voicemail as directed and we will return your call as soon as possible. Messages left after 4 pm will be answered the following business day.   You may also send [place] a message via MyChart. We typically respond to MyChart messages within 1-2 business days.  For prescription refills, please ask your pharmacy to contact our office. Our fax number is [phone].  If you have an urgent issue when the clinic is closed that cannot wait until the next business day, you can page your doctor at the number below.    Please note that while we do our best to be available for urgent issues outside of office hours, we are not available 24/7.   If you have an urgent issue and are unable to reach [place], you may choose to seek medical care at your doctor's office, retail clinic, urgent care center, or emergency room.  If you have a medical emergency, please immediately call 911 or go to the emergency department.  Pager Numbers  - Dr. Gwen Pounds: [phone]  - Dr. Roseanne Reno:  [phone]  - Dr. Katrinka Blazing: [phone]   In the event of inclement weather, please call our main line at [phone] for an update on the status of any delays or closures.  Dermatology Medication Tips: Please keep the boxes that topical medications come in in order to help keep track of the instructions about where and how to use these. Pharmacies typically print the medication instructions only on the boxes and not directly on the medication tubes.   If your medication is too expensive, please contact our office at [phone] option 4 or send [place] a message through MyChart.   We are unable to tell what your co-pay for medications will be in advance as this is different depending on your insurance coverage. However, we may be able to find a substitute medication at lower cost or fill out paperwork to get insurance to cover a needed medication.  If a prior authorization is required to get your medication covered by your insurance company, please allow [place] 1-2 business days to complete this process.  Drug prices often vary depending on where the prescription is filled and some pharmacies may offer cheaper prices.  The website www.goodrx.com contains coupons for medications through different pharmacies. The prices here do not account for what the cost may be with help from insurance (it may be cheaper with your insurance), but the website can give you the price if you did not use any insurance.  - You can print the associated coupon and take it with your prescription to the pharmacy.  - You may also stop by our office during regular business hours and pick up a GoodRx coupon card.  - If you need your prescription sent electronically to a different pharmacy, notify our office through [facility] or by phone at [phone] option 4.     Si Usted Necesita Algo Despus de Su Visita  Tambin puede enviarnos un mensaje a travs de Clinical cytogeneticist. Por lo general respondemos a los mensajes de  MyChart en el transcurso de 1 a 2 das hbiles.  Para renovar recetas, por favor pida a su farmacia que se ponga en contacto con nuestra oficina. Annie Sable de fax es [place] [phone].  Si tiene un asunto urgente cuando la clnica est cerrada y que no puede esperar hasta el siguiente da hbil, puede llamar/localizar a su doctor(a) al nmero que aparece a continuacin.   Por favor, tenga en cuenta que aunque hacemos todo lo posible para estar disponibles para asuntos urgentes fuera del horario de [place], no estamos disponibles las 24 horas del da, los 7 [address]  [address] de la [place].   Si tiene un problema urgente y no puede comunicarse con nosotros, puede optar por buscar atencin mdica  en el consultorio de su doctor(a), en una clnica privada, en un centro de atencin urgente o en una sala de emergencias.  Si tiene Engineer, drilling, por favor llame inmediatamente al 911 o vaya a la sala de emergencias.  Nmeros de bper  - Dr. Gwen Pounds: [phone]  - Dra. Roseanne Reno: [phone]  - Dr. Katrinka Blazing: [phone]   En caso de inclemencias del tiempo, por favor llame a Lacy Duverney principal al [phone] para una actualizacin sobre el [place] de cualquier retraso o cierre.  Consejos para la medicacin en dermatologa: Por favor, guarde las cajas en las que vienen los medicamentos de uso tpico para ayudarle a seguir las instrucciones sobre dnde y cmo usarlos. Las farmacias generalmente imprimen las instrucciones del medicamento slo en las cajas y no directamente en los tubos del [place].   Si su medicamento es muy caro, por favor, pngase en contacto con Rolm Gala llamando al [phone] y presione la opcin 4 o envenos un mensaje a travs de Clinical cytogeneticist.   No podemos decirle cul ser su copago por los medicamentos por adelantado ya que esto es diferente dependiendo de la cobertura de su seguro. Sin embargo, es posible que podamos encontrar un medicamento sustituto a Advice worker un formulario para que el seguro cubra el medicamento que se considera necesario.   Si se requiere una autorizacin previa para que su compaa de seguros [place] su medicamento, por favor permtanos de 1 a 2 das hbiles para completar [address]  Los precios de los medicamentos varan con frecuencia dependiendo del Environmental consultant de dnde se surte la receta y alguna farmacias pueden ofrecer precios ms baratos.  El sitio web  www.goodrx.com tiene cupones para medicamentos de Health and safety inspector. Los precios aqu no tienen en cuenta lo que podra costar con la ayuda del seguro (puede ser ms barato con su seguro), pero el sitio web puede darle el precio si no utiliz Tourist information centre manager.  - Puede imprimir el cupn correspondiente y llevarlo con su receta a la farmacia.  - Tambin puede pasar por nuestra oficina durante el horario de atencin regular y Education officer, museum una tarjeta de cupones de GoodRx.  - Si necesita que su receta se enve electrnicamente a una farmacia diferente, informe a nuestra oficina a travs de MyChart de [place] o por telfono llamando al [phone] y presione la opcin 4.

## 2023-06-20 NOTE — Progress Notes (Signed)
Follow-Up Visit   Subjective  [name] [name] is a 67 y.o. female who presents for the following: 6 month ak follow up, patient has hx of bcc and scc. Reports she is having Mohs surgery tomorrow to treat recent scc found at left lower lateral leg but she thinks it is clear. Today she reports a spot at right sideburn area and dryness at ears.    The patient has spots, moles and lesions to be evaluated, some may be new or changing and the patient may have concern these could be cancer.   The following portions of the chart were reviewed this encounter and updated as appropriate: medications, allergies, medical history  Review of Systems:  No other skin or systemic complaints except as noted in HPI or Assessment and Plan.  Objective  Well appearing patient in no apparent distress; mood and affect are within normal limits.   A focused examination was performed of the following areas: Face, b/l arms, b/l hands, b/l lower legs   Relevant exam findings are noted in the Assessment and Plan.  left medial lower leg x 1, left upper pretibia x 5, right forearm x 3, left wrist x 3, left upper knee x 2, right medial knee x 1, left anterior thigh x 1, right lateral thigh x 1, left ankle x 1, (18) Erythematous thin papules/macules with gritty scale.     Assessment & Plan   SEBORRHEIC KERATOSIS - Stuck-on, waxy, tan-brown papules including Sk at right preauricular area - Benign-appearing - Discussed benign etiology and prognosis. - Observe - Call for any changes   LENTIGINES Exam: scattered tan macules Due to sun exposure Treatment Plan: Benign-appearing, observe. Recommend daily broad spectrum sunscreen SPF 30+ to sun-exposed areas, reapply every 2 hours as needed.  Call for any changes   ACTINIC DAMAGE - chronic, secondary to cumulative UV radiation exposure/sun exposure over time - diffuse scaly erythematous macules with underlying dyspigmentation - Recommend daily broad spectrum  sunscreen SPF 30+ to sun-exposed areas, reapply every 2 hours as needed.  - Recommend staying in the shade or wearing long sleeves, sun glasses (UVA+UVB protection) and wide brim hats (4-inch brim around the entire circumference of the hat). - Call for new or changing lesions.  HISTORY OF SQUAMOUS CELL CARCINOMA OF THE SKIN 03/06/23 Left lower lateral leg- clear at this time at exam today with biopsy only, Mohs not needed, pt will cancel appt 12/27/21 left pretibial ED&C site clear - No evidence of recurrence today - Recommend regular full body skin exams - Recommend daily broad spectrum sunscreen SPF 30+ to sun-exposed areas, reapply every 2 hours as needed.  - Call if any new or changing lesions are noted between office visits  Actinic keratosis (18) left medial lower leg x 1, left upper pretibia x 5, right forearm x 3, left wrist x 3, left upper knee x 2, right medial knee x 1, left anterior thigh x 1, right lateral thigh x 1, left ankle x 1,  Hypertrophic aks at lower legs and thighs   Actinic keratoses are precancerous spots that appear secondary to cumulative UV radiation exposure/sun exposure over time. They are chronic with expected duration over 1 year. A portion of actinic keratoses will progress to squamous cell carcinoma of the skin. It is not possible to reliably predict which spots will progress to skin cancer and so treatment is recommended to prevent development of skin cancer.  Recommend daily broad spectrum sunscreen SPF 30+ to sun-exposed areas, reapply every  2 hours as needed.  Recommend staying in the shade or wearing long sleeves, sun glasses (UVA+UVB protection) and wide brim hats (4-inch brim around the entire circumference of the hat). Call for new or changing lesions.  Destruction of lesion - left medial lower leg x 1, left upper pretibia x 5, right forearm x 3, left wrist x 3, left upper knee x 2, right medial knee x 1, left anterior thigh x 1, right lateral thigh x  1, left ankle x 1, (18)  Destruction method: cryotherapy   Informed consent: discussed and consent obtained   Lesion destroyed using liquid nitrogen: Yes   Region frozen until ice ball extended beyond lesion: Yes   Outcome: patient tolerated procedure well with no complications   Post-procedure details: wound care instructions given   Additional details:  Prior to procedure, discussed risks of blister formation, small wound, skin dyspigmentation, or rare scar following cryotherapy. Recommend Vaseline ointment to treated areas while healing.   HISTORY OF BASAL CELL CARCINOMA OF THE SKIN Left upper abdomen EDC done 12/13/2022 - No evidence of recurrence today - Recommend regular full body skin exams - Recommend daily broad spectrum sunscreen SPF 30+ to sun-exposed areas, reapply every 2 hours as needed.  - Call if any new or changing lesions are noted between office visits   Return in about 6 months (around 12/18/2023) for TBSE.  I, Asher Muir, CMA, am acting as scribe for Willeen Niece, MD.   Documentation: I have reviewed the above documentation for accuracy and completeness, and I agree with the above.  Willeen Niece, MD

## 2023-06-21 ENCOUNTER — Encounter: Payer: 59 | Admitting: Physical Therapy

## 2023-06-21 ENCOUNTER — Ambulatory Visit: Payer: 59 | Admitting: Physical Therapy

## 2023-06-26 ENCOUNTER — Ambulatory Visit: Payer: 59 | Admitting: Physical Therapy

## 2023-06-26 ENCOUNTER — Encounter: Payer: 59 | Admitting: Physical Therapy

## 2023-06-28 ENCOUNTER — Ambulatory Visit: Payer: 59 | Admitting: Physical Therapy

## 2023-06-28 ENCOUNTER — Encounter: Payer: 59 | Admitting: Physical Therapy

## 2023-06-28 DIAGNOSIS — M5459 Other low back pain: Secondary | ICD-10-CM | POA: Diagnosis not present

## 2023-06-28 DIAGNOSIS — M6281 Muscle weakness (generalized): Secondary | ICD-10-CM

## 2023-06-28 NOTE — Therapy (Unsigned)
OUTPATIENT PHYSICAL THERAPY LUMBAR treatment        Patient Name: [name] [name] MRN: [id number] DOB:1956/04/26, 67 y.o., female Today's Date: 06/28/2023  END OF SESSION:  PT End of Session - 06/28/23 1529     Visit Number 11    Number of Visits 16    Date for PT Re-Evaluation 08/03/23    Authorization Type [employer]    Progress Note Due on Visit 18    PT Start Time 1449    PT Stop Time 1528    PT Time Calculation (min) 39 min    Equipment Utilized During Treatment Gait belt    Activity Tolerance Patient tolerated treatment well;No increased pain    Behavior During Therapy WFL for tasks assessed/performed               Past Medical History:  Diagnosis Date   Actinic keratosis    Acute hepatitis C without mention of hepatic coma(070.51)    Arthritis    fingers   Basal cell carcinoma 12/13/2022   left upper abdomen, EDC   Carpal tunnel syndrome of left wrist    Depression    GERD (gastroesophageal reflux disease)    Hypothyroidism    Insomnia    Insomnia    Memory loss    Osteoporosis    SCC (squamous cell carcinoma) 03/06/2023   left lower lateral leg, refer for Mohs   Squamous cell carcinoma of skin 12/27/2021   Left pretibial - EDC   Tremor    Wears dentures    full upper, partial lower   Past Surgical History:  Procedure Laterality Date   ANKLE SURGERY Right    BLADDER TACT     CATARACT EXTRACTION W/PHACO Left 10/13/2021   Procedure: CATARACT EXTRACTION PHACO AND INTRAOCULAR LENS PLACEMENT (IOC) LEFT;  Surgeon: Lockie Mola, MD;  Location: [facility];  Service: Ophthalmology;  Laterality: Left;  3.97 00:49.0   CATARACT EXTRACTION W/PHACO Right 10/27/2021   Procedure: CATARACT EXTRACTION PHACO AND INTRAOCULAR LENS PLACEMENT (IOC) RIGHT 4.50 00:58.9;  Surgeon: Lockie Mola, MD;  Location: [facility];  Service: Ophthalmology;  Laterality: Right;   HAND SURGERY Right    TONSILLECTOMY     Patient Active Problem List    Diagnosis Date Noted   Hepatitis C 08/19/2016   Depression 08/19/2016   Insomnia 08/19/2016   Hypothyroidism 08/19/2016    PCP:   Salena Saner, MD    REFERRING PROVIDER:   Altamese Cabal, PA-C    REFERRING DIAG:  Diagnosis  M54.2 (ICD-10-CM) - Neck pain    THERAPY DIAG:  No diagnosis found.  Rationale for Evaluation and Treatment: Rehabilitation  ONSET DATE: 5 months   SUBJECTIVE:  SUBJECTIVE STATEMENT: Pt reports to PT with no pain at start of PT session. Had some pain earlier in the day but no current pain.   Hand dominance: Right  PERTINENT HISTORY:   [name] [name] is a 67yoF who is referred to OPPT from orthopedics for neck pain s/p left (upper) trapezius strain. Pt familiar to our services from recent PT for BLE strength and balance July 2024. Since then pt reports increased back, knee and neck pain with increased soreness at the end of work day at [employer]. MD note also remarks on DJD of shoulder and cervical spine, recommendation for conservative management with muscle relaxer and NSAID therapies. Planning for FU at 6 weeks.   PAIN:  Are you having pain?  0/10 current  Description: aching    PRECAUTIONS: None and Other: wearing back brace for comfort   RED FLAGS: None     WEIGHT BEARING RESTRICTIONS: No  FALLS:  Has patient fallen in last 6 months? No  LIVING ENVIRONMENT: Lives with: lives alone Lives in: House/apartment Stairs: Yes: External: 5 steps; on right going up Has following equipment at home: Walker - 2 wheeled and does not use   OCCUPATION: food set up and delivery   PLOF: Independent  PATIENT GOALS: get back stronger  NEXT MD VISIT: sept 19, [place] visit    OBJECTIVE: (objective measures completed at evaluation of the lumbar  spine on 10/24 unless otherwise dated)  LUMBAR ROM:  Active  A/PROM  eval  Flexion Hands to knees   Extension Min movement   Right lateral flexion WNL  Left lateral flexion WNL  Right rotation Limited, no pain  Left rotation Limited, no pain   (Blank rows = not tested)  MMT grades Right Left  Hip flexion 4+ 4+  Hip Abduction 4+ 4 (on 10/31 in [place])  Hip Adduction    Knee Extension     Knee Flexion    DF    PF     Special tests   HS length 35 on R, 16 on L Piriformis, tightness on the L  DLLT 75 degrees caused pain for remainder  TODAY'S TREATMENT:                                                                                                                              DATE: 06/28/2023  Supine/sidelying  PROM Hamstring stretch LLE 2x45 seconds   PROM piriformis stretch LLE 2x45 seconds Figure 4 stretch x 45 sec LLE   TE: Lower back rotational stretch 10x5 seconds each side Supine glute bridges with isometric hip abduction resistance for increased gluteal activation 2x10 -cues for proper form as pt had initial discomfort with this movement.  Sideling clamshell 2 x 12 ea LE  Mcgill curl up 2 x 10 reps Posterior pelvic tilt x 10  SLR 2 x 10 reps each LE     Standing hip flexor stretch x 45 sec LLE  PATIENT EDUCATION:  Education details: HEP education Benefits for deep core activation to improve spinal stability and reduce compensatory movement strategies.    HOME EXERCISE PROGRAM: Access Code: [email or web address] URL: [email or web address] Date: 06/15/2023 Prepared by: Thresa Ross  Exercises - Supine Lower Trunk Rotation  - 1 x daily - 7 x weekly - 10 reps - 10 sec  hold - Supine Bridge with Resistance Band  - 1 x daily - 7 x weekly - 2 sets - 8 reps - Supine Transversus Abdominis Bracing - Hands on Stomach  - 1 x daily - 7 x weekly - 3 sets - 10 reps - Supine Straight Leg Raises  - 1 x daily - 7 x weekly - 2 sets - 8  reps  ASSESSMENT:  CLINICAL IMPRESSION:  Continued with current plan of care as laid out in evaluation and recent prior sessions. Pt remains motivated to advance progress toward goals in order to maximize independence and safety at home. Pt requires high level assistance and cuing for completion of exercises in order to provide adequate level of stimulation challenge while minimizing pain and discomfort when possible. Pt closely monitored throughout session pt response and to maximize patient safety during interventions. Pt continues to demonstrate progress toward goals AEB progression of interventions this date either in volume or intensity.  OBJECTIVE IMPAIRMENTS: decreased ROM, decreased strength, hypomobility, increased fascial restrictions, increased muscle spasms, impaired flexibility, improper body mechanics, postural dysfunction, and pain.   ACTIVITY LIMITATIONS: carrying, lifting, standing, squatting, sleeping, reach over head, and hygiene/grooming  PARTICIPATION LIMITATIONS: laundry, driving, shopping, community activity, and occupation  PERSONAL FACTORS: Age, Behavior pattern, Education, Fitness, Past/current experiences, and 1 comorbidity: LBP knee pain  are also affecting patient's functional outcome.   REHAB POTENTIAL: Good  CLINICAL DECISION MAKING: Stable/uncomplicated  EVALUATION COMPLEXITY: Moderate   GOALS: Goals reviewed with patient? Yes  SHORT TERM GOALS: Target date: 05/31/2023  Pt will be independent with HEP to address cervical stiffness/pain without assist from PT Baseline: to be given at session 2.  10/24: been completing, pain has been improved   Goal status: pending reassessment   LONG TERM GOALS: Target date: 06/22/2023   Pt will increased FOTO to >66 to indicate improved function and reduced impairment from cervical pain  Baseline: 60 10/24:66 Goal status: Met goal for cervical F OTO  2.  Pt will improve NDI by at least 5 pts to demonstrate  improved function with ADLs and reduced disability as result of neck pain.  Baseline: 7/50 10/24: 5/50 Goal status: partially met  3.  Pt will improve cervical rotation to at least 55 deg to improve safety with driving Bil  Baseline: 45 L, 52 R  Goal status: Met  4.  Pt will increase lateral flexion in Cspine by at least 15 deg on the L to improve safety and reduced pain with work tasks of moving and preparing food at [employer]  Baseline: L 8 deg L 25 R 35 Goal status: MET   5.  Pt will report pain in neck 2/10 at worst to improve QoL and improve tolerance to work related tasks  Baseline: 6/10  10/24:  No neck pain in last several days  Goal status:MET  6.  Patient will improve modified Oswestry back pain disability questionnaire by 5 points or greater to indicate improved subjective level of back pain with functional activities Baseline: 10/50 Goal status: INITIAL 7.  Patient will improve double leg limb lowering test by 20 degrees or greater indicating improved spinal  stability as well as improved core muscle activation. Baseline: 75 degrees ( 15 form 90) Goal status: INITIAL  8.  Patient will report improved back pain as evidenced by pain level of 3 of 10 or less on visual analog pain scale in order to indicate improved quality of life Baseline: 7/10 when bending or stooping  Goal status: INITIAL    PLAN:  PT FREQUENCY: 1-2x/week  PT DURATION: 8 weeks  PLANNED INTERVENTIONS: Therapeutic exercises, Therapeutic activity, Neuromuscular re-education, Balance training, Gait training, Patient/Family education, Self Care, Joint mobilization, Joint manipulation, Stair training, Dry Needling, Electrical stimulation, Spinal manipulation, Spinal mobilization, Cryotherapy, Moist heat, scar mobilization, Taping, Traction, Ultrasound, Ionotophoresis 4mg /ml Dexamethasone, Manual therapy, and Re-evaluation  PLAN FOR NEXT SESSION:  Deep core activation, lower back STM as indicated,  hip strength as indicated   Grier Rocher PT, DPT  Physical Therapist - [place]  [facility]  3:30 PM 06/28/23

## 2023-07-03 ENCOUNTER — Encounter: Payer: 59 | Admitting: Physical Therapy

## 2023-07-03 ENCOUNTER — Telehealth: Payer: Self-pay

## 2023-07-03 ENCOUNTER — Ambulatory Visit: Payer: 59 | Admitting: Physical Therapy

## 2023-07-03 DIAGNOSIS — M5459 Other low back pain: Secondary | ICD-10-CM | POA: Diagnosis not present

## 2023-07-03 DIAGNOSIS — R278 Other lack of coordination: Secondary | ICD-10-CM

## 2023-07-03 DIAGNOSIS — R2681 Unsteadiness on feet: Secondary | ICD-10-CM

## 2023-07-03 DIAGNOSIS — R2689 Other abnormalities of gait and mobility: Secondary | ICD-10-CM

## 2023-07-03 DIAGNOSIS — M542 Cervicalgia: Secondary | ICD-10-CM

## 2023-07-03 DIAGNOSIS — M6281 Muscle weakness (generalized): Secondary | ICD-10-CM

## 2023-07-03 NOTE — Therapy (Unsigned)
OUTPATIENT PHYSICAL THERAPY LUMBAR treatment        Patient Name: RUBI OOTEN MRN: [id number] DOB:1956/07/17, 67 y.o., female Today's Date: 07/03/2023  END OF SESSION:  PT End of Session - 07/03/23 1404     Visit Number 12    Number of Visits 16    Date for PT Re-Evaluation 08/03/23    Authorization Type [employer]    Progress Note Due on Visit 18    PT Start Time 1420    PT Stop Time 1455    PT Time Calculation (min) 35 min    Equipment Utilized During Treatment Gait belt    Activity Tolerance Patient tolerated treatment well;No increased pain    Behavior During Therapy WFL for tasks assessed/performed               Past Medical History:  Diagnosis Date   Actinic keratosis    Acute hepatitis C without mention of hepatic coma(070.51)    Arthritis    fingers   Basal cell carcinoma 12/13/2022   left upper abdomen, EDC   Carpal tunnel syndrome of left wrist    Depression    GERD (gastroesophageal reflux disease)    Hypothyroidism    Insomnia    Insomnia    Memory loss    Osteoporosis    SCC (squamous cell carcinoma) 03/06/2023   left lower lateral leg, refer for Mohs   Squamous cell carcinoma of skin 12/27/2021   Left pretibial - EDC   Tremor    Wears dentures    full upper, partial lower   Past Surgical History:  Procedure Laterality Date   ANKLE SURGERY Right    BLADDER TACT     CATARACT EXTRACTION W/PHACO Left 10/13/2021   Procedure: CATARACT EXTRACTION PHACO AND INTRAOCULAR LENS PLACEMENT (IOC) LEFT;  Surgeon: Lockie Mola, MD;  Location: [facility];  Service: Ophthalmology;  Laterality: Left;  3.97 00:49.0   CATARACT EXTRACTION W/PHACO Right 10/27/2021   Procedure: CATARACT EXTRACTION PHACO AND INTRAOCULAR LENS PLACEMENT (IOC) RIGHT 4.50 00:58.9;  Surgeon: Lockie Mola, MD;  Location: [facility];  Service: Ophthalmology;  Laterality: Right;   HAND SURGERY Right    TONSILLECTOMY     Patient Active Problem List    Diagnosis Date Noted   Hepatitis C 08/19/2016   Depression 08/19/2016   Insomnia 08/19/2016   Hypothyroidism 08/19/2016    PCP:   Salena Saner, MD    REFERRING PROVIDER:   Altamese Cabal, PA-C    REFERRING DIAG:  Diagnosis  M54.2 (ICD-10-CM) - Neck pain    THERAPY DIAG:  Other low back pain  Muscle weakness (generalized)  Unsteadiness on feet  Other abnormalities of gait and mobility  Other lack of coordination  Neck pain  Rationale for Evaluation and Treatment: Rehabilitation  ONSET DATE: 5 months   SUBJECTIVE:  SUBJECTIVE STATEMENT: Pt reports to PT with no pain at start of PT session. Reports that she recently found out that she cannot work her normal hours and draw Social Security benefits. Will be out of work for a while.   Hand dominance: Right  PERTINENT HISTORY:   Jadalise Delaine is a 67yoF who is referred to OPPT from orthopedics for neck pain s/p left (upper) trapezius strain. Pt familiar to our services from recent PT for BLE strength and balance July 2024. Since then pt reports increased back, knee and neck pain with increased soreness at the end of work day at [employer]. MD note also remarks on DJD of shoulder and cervical spine, recommendation for conservative management with muscle relaxer and NSAID therapies. Planning for FU at 6 weeks.   PAIN:  Are you having pain?  0/10 current  Description: aching    PRECAUTIONS: None and Other: wearing back brace for comfort   RED FLAGS: None     WEIGHT BEARING RESTRICTIONS: No  FALLS:  Has patient fallen in last 6 months? No  LIVING ENVIRONMENT: Lives with: lives alone Lives in: House/apartment Stairs: Yes: External: 5 steps; on right going up Has following equipment at home: Walker - 2  wheeled and does not use   OCCUPATION: food set up and delivery   PLOF: Independent  PATIENT GOALS: get back stronger  NEXT MD VISIT: sept 19, [place] visit    OBJECTIVE: (objective measures completed at evaluation of the lumbar spine on 10/24 unless otherwise dated)  LUMBAR ROM:  Active  A/PROM  eval  Flexion Hands to knees   Extension Min movement   Right lateral flexion WNL  Left lateral flexion WNL  Right rotation Limited, no pain  Left rotation Limited, no pain   (Blank rows = not tested)  MMT grades Right Left  Hip flexion 4+ 4+  Hip Abduction 4+ 4 (on 10/31 in [place])  Hip Adduction    Knee Extension     Knee Flexion    DF    PF     Special tests   HS length 35 on R, 16 on L Piriformis, tightness on the L  DLLT 75 degrees caused pain for remainder  TODAY'S TREATMENT:                                                                                                                              DATE: 07/03/2023 Nustep. Level 1-2 x 5 min   Supine therex:  SLR x 12  Pelvis rock x 10   LTR x 12 with 3 sec hold  Open book  x 12 with 3 sec hold and min assist for proper UE ROM  Bridge with bolster squeeze.  X 12  5 sec boat hold x 5  Bridges without support x 8  Forward tball rollout x 12  Diagonal t ball rollout x 10 bil  Standing wall plank 3 x 15 sec hold  Standing side wall plank 3 2 x 15 sec bil.   PATIENT EDUCATION:  Education details: HEP education Benefits for deep core activation to improve spinal stability and reduce compensatory movement strategies.    HOME EXERCISE PROGRAM: Access Code: [email or web address] URL: [email or web address] Date: 06/15/2023 Prepared by: Thresa Ross  Exercises - Supine Lower Trunk Rotation  - 1 x daily - 7 x weekly - 10 reps - 10 sec  hold - Supine Bridge with Resistance Band  - 1 x daily - 7 x weekly - 2 sets - 8 reps - Supine Transversus Abdominis Bracing - Hands on Stomach  - 1 x daily - 7 x weekly - 3 sets - 10  reps - Supine Straight Leg Raises  - 1 x daily - 7 x weekly - 2 sets - 8 reps  ASSESSMENT:  CLINICAL IMPRESSION:  Continued with current plan of care as laid out in evaluation and recent prior sessions. Due to reduced work hours, pt reports decreased pain in low back, but occasional stiffness prior to PT treatment. Pt remains motivated to advance progress toward goals in order to maximize independence and safety at home. Pt requires high level assistance and cuing for completion of exercises in order to provide adequate level of stimulation challenge while minimizing pain and discomfort when possible. Pt closely monitored throughout session pt response and to maximize patient safety during interventions. Pt continues to demonstrate progress toward goals AEB progression of interventions this date either in volume or intensity.  OBJECTIVE IMPAIRMENTS: decreased ROM, decreased strength, hypomobility, increased fascial restrictions, increased muscle spasms, impaired flexibility, improper body mechanics, postural dysfunction, and pain.   ACTIVITY LIMITATIONS: carrying, lifting, standing, squatting, sleeping, reach over head, and hygiene/grooming  PARTICIPATION LIMITATIONS: laundry, driving, shopping, community activity, and occupation  PERSONAL FACTORS: Age, Behavior pattern, Education, Fitness, Past/current experiences, and 1 comorbidity: LBP knee pain  are also affecting patient's functional outcome.   REHAB POTENTIAL: Good  CLINICAL DECISION MAKING: Stable/uncomplicated  EVALUATION COMPLEXITY: Moderate   GOALS: Goals reviewed with patient? Yes  SHORT TERM GOALS: Target date: 05/31/2023  Pt will be independent with HEP to address cervical stiffness/pain without assist from PT Baseline: to be given at session 2.  10/24: been completing, pain has been improved   Goal status: pending reassessment   LONG TERM GOALS: Target date: 06/22/2023   Pt will increased FOTO to >66 to indicate  improved function and reduced impairment from cervical pain  Baseline: 60 10/24:66 Goal status: Met goal for cervical F OTO  2.  Pt will improve NDI by at least 5 pts to demonstrate improved function with ADLs and reduced disability as result of neck pain.  Baseline: 7/50 10/24: 5/50 Goal status: partially met  3.  Pt will improve cervical rotation to at least 55 deg to improve safety with driving Bil  Baseline: 45 L, 52 R  Goal status: Met  4.  Pt will increase lateral flexion in Cspine by at least 15 deg on the L to improve safety and reduced pain with work tasks of moving and preparing food at [employer]  Baseline: L 8 deg L 25 R 35 Goal status: MET   5.  Pt will report pain in neck 2/10 at worst to improve QoL and improve tolerance to work related tasks  Baseline: 6/10  10/24:  No neck pain in last several days  Goal status:MET  6.  Patient will improve modified Oswestry back pain disability questionnaire by 5 points or greater to indicate improved  subjective level of back pain with functional activities Baseline: 10/50 Goal status: INITIAL 7.  Patient will improve double leg limb lowering test by 20 degrees or greater indicating improved spinal stability as well as improved core muscle activation. Baseline: 75 degrees ( 15 form 90) Goal status: INITIAL  8.  Patient will report improved back pain as evidenced by pain level of 3 of 10 or less on visual analog pain scale in order to indicate improved quality of life Baseline: 7/10 when bending or stooping  Goal status: INITIAL    PLAN:  PT FREQUENCY: 1-2x/week  PT DURATION: 8 weeks  PLANNED INTERVENTIONS: Therapeutic exercises, Therapeutic activity, Neuromuscular re-education, Balance training, Gait training, Patient/Family education, Self Care, Joint mobilization, Joint manipulation, Stair training, Dry Needling, Electrical stimulation, Spinal manipulation, Spinal mobilization, Cryotherapy, Moist heat, scar  mobilization, Taping, Traction, Ultrasound, Ionotophoresis 4mg /ml Dexamethasone, Manual therapy, and Re-evaluation  PLAN FOR NEXT SESSION:   Deep core activation, lower back STM as indicated, hip strength as indicated   Golden Pop PT ,DPT Physical Therapist- [facility]  [facility]   2:07 PM 07/03/23

## 2023-07-03 NOTE — Telephone Encounter (Signed)
Per pt wants to cancel procedure. Per pt know  need to call back to r/s she doesn't want to have it done.

## 2023-07-03 NOTE — Telephone Encounter (Signed)
Called endo and talk to [place] and got the procedures canceled. Called patient and informed patient it has been cancel and told her if she did want to reschedule just to let us know and she said she doubt that will ever happen but she appreciates it.

## 2023-07-05 ENCOUNTER — Encounter: Payer: 59 | Admitting: Physical Therapy

## 2023-07-05 ENCOUNTER — Ambulatory Visit: Admission: RE | Admit: 2023-07-05 | Payer: 59 | Source: Home / Self Care | Admitting: Gastroenterology

## 2023-07-05 SURGERY — COLONOSCOPY WITH PROPOFOL
Anesthesia: General

## 2023-07-10 ENCOUNTER — Encounter: Payer: 59 | Admitting: Physical Therapy

## 2023-07-10 ENCOUNTER — Ambulatory Visit: Payer: 59 | Admitting: Physical Therapy

## 2023-07-10 DIAGNOSIS — M6281 Muscle weakness (generalized): Secondary | ICD-10-CM

## 2023-07-10 DIAGNOSIS — M542 Cervicalgia: Secondary | ICD-10-CM

## 2023-07-10 DIAGNOSIS — R278 Other lack of coordination: Secondary | ICD-10-CM

## 2023-07-10 DIAGNOSIS — R2689 Other abnormalities of gait and mobility: Secondary | ICD-10-CM

## 2023-07-10 DIAGNOSIS — M5459 Other low back pain: Secondary | ICD-10-CM | POA: Diagnosis not present

## 2023-07-10 DIAGNOSIS — R2681 Unsteadiness on feet: Secondary | ICD-10-CM

## 2023-07-10 NOTE — Therapy (Unsigned)
OUTPATIENT PHYSICAL THERAPY LUMBAR treatment        Patient Name: [name] [name] MRN: [id number] DOB:April 25, 1956, 67 y.o., female Today's Date: 07/10/2023  END OF SESSION:  PT End of Session - 07/10/23 1635     Visit Number 13    Number of Visits 16    Date for PT Re-Evaluation 08/03/23    Authorization Type U[employer]    Progress Note Due on Visit 18    PT Start Time 1620    PT Stop Time 1700    PT Time Calculation (min) 40 min    Equipment Utilized During Treatment Gait belt    Activity Tolerance Patient tolerated treatment well;No increased pain    Behavior During Therapy WFL for tasks assessed/performed               Past Medical History:  Diagnosis Date   Actinic keratosis    Acute hepatitis C without mention of hepatic coma(070.51)    Arthritis    fingers   Basal cell carcinoma 12/13/2022   left upper abdomen, EDC   Carpal tunnel syndrome of left wrist    Depression    GERD (gastroesophageal reflux disease)    Hypothyroidism    Insomnia    Insomnia    Memory loss    Osteoporosis    SCC (squamous cell carcinoma) 03/06/2023   left lower lateral leg, refer for Mohs   Squamous cell carcinoma of skin 12/27/2021   Left pretibial - EDC   Tremor    Wears dentures    full upper, partial lower   Past Surgical History:  Procedure Laterality Date   ANKLE SURGERY Right    BLADDER TACT     CATARACT EXTRACTION W/PHACO Left 10/13/2021   Procedure: CATARACT EXTRACTION PHACO AND INTRAOCULAR LENS PLACEMENT (IOC) LEFT;  Surgeon: Lockie Mola, MD;  Location: [facility];  Service: Ophthalmology;  Laterality: Left;  3.97 00:49.0   CATARACT EXTRACTION W/PHACO Right 10/27/2021   Procedure: CATARACT EXTRACTION PHACO AND INTRAOCULAR LENS PLACEMENT (IOC) RIGHT 4.50 00:58.9;  Surgeon: Lockie Mola, MD;  Location: [facility];  Service: Ophthalmology;  Laterality: Right;   HAND SURGERY Right    TONSILLECTOMY     Patient Active Problem List    Diagnosis Date Noted   Hepatitis C 08/19/2016   Depression 08/19/2016   Insomnia 08/19/2016   Hypothyroidism 08/19/2016    PCP:   Salena Saner, MD    REFERRING PROVIDER:   Altamese Cabal, PA-C    REFERRING DIAG:  Diagnosis  M54.2 (ICD-10-CM) - Neck pain    THERAPY DIAG:  Other low back pain  Muscle weakness (generalized)  Unsteadiness on feet  Neck pain  Other lack of coordination  Other abnormalities of gait and mobility  Rationale for Evaluation and Treatment: Rehabilitation  ONSET DATE: 5 months   SUBJECTIVE:  SUBJECTIVE STATEMENT: Pt reports to PT with no pain at start of PT session. States she is not doing much to irritate her back daily since cutting back on work hours.  Hand dominance: Right  PERTINENT HISTORY:   [name] [name] is a 67yoF who is referred to OPPT from orthopedics for neck pain s/p left (upper) trapezius strain. Pt familiar to our services from recent PT for BLE strength and balance July 2024. Since then pt reports increased back, knee and neck pain with increased soreness at the end of work day at [employer]. MD note also remarks on DJD of shoulder and cervical spine, recommendation for conservative management with muscle relaxer and NSAID therapies. Planning for FU at 6 weeks.   PAIN:  Are you having pain?  0/10 current  Description: aching    PRECAUTIONS: None and Other: wearing back brace for comfort   RED FLAGS: None     WEIGHT BEARING RESTRICTIONS: No  FALLS:  Has patient fallen in last 6 months? No  LIVING ENVIRONMENT: Lives with: lives alone Lives in: House/apartment Stairs: Yes: External: 5 steps; on right going up Has following equipment at home: Walker - 2 wheeled and does not use   OCCUPATION: food set up and  delivery   PLOF: Independent  PATIENT GOALS: get back stronger  NEXT MD VISIT: sept 19, [place] visit    OBJECTIVE: (objective measures completed at evaluation of the lumbar spine on 10/24 unless otherwise dated)  LUMBAR ROM:  Active  A/PROM  eval  Flexion Hands to knees   Extension Min movement   Right lateral flexion WNL  Left lateral flexion WNL  Right rotation Limited, no pain  Left rotation Limited, no pain   (Blank rows = not tested)  MMT grades Right Left  Hip flexion 4+ 4+  Hip Abduction 4+ 4 (on 10/31 in [place])  Hip Adduction    Knee Extension     Knee Flexion    DF    PF     Special tests   HS length 35 on R, 16 on L Piriformis, tightness on the L  DLLT 75 degrees caused pain for remainder  TODAY'S TREATMENT:                                                                                                                              DATE: 07/10/2023  UBE forward/reverse 2 each with 2 min rest break between bouts.   UT stretch 2 x 30 sec  Cervical rotation 2 x 30 sec bil  Wall plank on rails 2 x 30 sec  Wall plank with hand tap x 10 bil  Wall plank with 1 LE lift x 10 bil  Piriformis sitting stretch 2x 30 sec  Therapy ball rollout forward x 10  Therapy ball rollout forward. Diagonal x 10 each   Cues for deep core activation.   PATIENT EDUCATION:  Education details: HEP education Benefits for deep core activation  to improve spinal stability and reduce compensatory movement strategies.  Pt educated throughout session about proper posture and technique with exercises. Improved exercise technique, movement at target joints, use of target muscles after min to mod verbal, visual, tactile cues.    HOME EXERCISE PROGRAM: Access Code: [email or web address] URL: [email or web address] Date: 06/15/2023 Prepared by: Thresa Ross  Exercises - Supine Lower Trunk Rotation  - 1 x daily - 7 x weekly - 10 reps - 10 sec  hold - Supine Bridge with Resistance Band   - 1 x daily - 7 x weekly - 2 sets - 8 reps - Supine Transversus Abdominis Bracing - Hands on Stomach  - 1 x daily - 7 x weekly - 3 sets - 10 reps - Supine Straight Leg Raises  - 1 x daily - 7 x weekly - 2 sets - 8 reps  ASSESSMENT:  CLINICAL IMPRESSION:  Continued with current plan of care as laid out in evaluation and recent prior sessions. Due to reduced work hours, pt reports decreased pain in low back, but occasional stiffness prior to PT treatment. PT treatment focused on improved hip mobility, core stability and cervical ROM. Due to reduced pain with daily task, pt me be near end of benefit from skilled PT. Pt closely monitored throughout session pt response and to maximize patient safety during interventions. Pt continues to demonstrate progress toward goals AEB progression of interventions this date either in volume or intensity.  OBJECTIVE IMPAIRMENTS: decreased ROM, decreased strength, hypomobility, increased fascial restrictions, increased muscle spasms, impaired flexibility, improper body mechanics, postural dysfunction, and pain.   ACTIVITY LIMITATIONS: carrying, lifting, standing, squatting, sleeping, reach over head, and hygiene/grooming  PARTICIPATION LIMITATIONS: laundry, driving, shopping, community activity, and occupation  PERSONAL FACTORS: Age, Behavior pattern, Education, Fitness, Past/current experiences, and 1 comorbidity: LBP knee pain  are also affecting patient's functional outcome.   REHAB POTENTIAL: Good  CLINICAL DECISION MAKING: Stable/uncomplicated  EVALUATION COMPLEXITY: Moderate   GOALS: Goals reviewed with patient? Yes  SHORT TERM GOALS: Target date: 05/31/2023  Pt will be independent with HEP to address cervical stiffness/pain without assist from PT Baseline: to be given at session 2.  10/24: been completing, pain has been improved   Goal status: pending reassessment   LONG TERM GOALS: Target date: 06/22/2023   Pt will increased FOTO to >66 to  indicate improved function and reduced impairment from cervical pain  Baseline: 60 10/24:66 Goal status: Met goal for cervical F OTO  2.  Pt will improve NDI by at least 5 pts to demonstrate improved function with ADLs and reduced disability as result of neck pain.  Baseline: 7/50 10/24: 5/50 Goal status: partially met  3.  Pt will improve cervical rotation to at least 55 deg to improve safety with driving Bil  Baseline: 45 L, 52 R  Goal status: Met  4.  Pt will increase lateral flexion in Cspine by at least 15 deg on the L to improve safety and reduced pain with work tasks of moving and preparing food at [employer]  Baseline: L 8 deg L 25 R 35 Goal status: MET   5.  Pt will report pain in neck 2/10 at worst to improve QoL and improve tolerance to work related tasks  Baseline: 6/10  10/24:  No neck pain in last several days  Goal status:MET  6.  Patient will improve modified Oswestry back pain disability questionnaire by 5 points or greater to indicate improved subjective level of back pain with functional activities  Baseline: 10/50 Goal status: INITIAL  7.  Patient will improve double leg limb lowering test by 20 degrees or greater indicating improved spinal stability as well as improved core muscle activation. Baseline: 75 degrees ( 15 form 90) Goal status: INITIAL  8.  Patient will report improved back pain as evidenced by pain level of 3 of 10 or less on visual analog pain scale in order to indicate improved quality of life Baseline: 7/10 when bending or stooping  Goal status: INITIAL    PLAN:  PT FREQUENCY: 1-2x/week  PT DURATION: 8 weeks  PLANNED INTERVENTIONS: Therapeutic exercises, Therapeutic activity, Neuromuscular re-education, Balance training, Gait training, Patient/Family education, Self Care, Joint mobilization, Joint manipulation, Stair training, Dry Needling, Electrical stimulation, Spinal manipulation, Spinal mobilization, Cryotherapy, Moist heat, scar  mobilization, Taping, Traction, Ultrasound, Ionotophoresis 4mg /ml Dexamethasone, Manual therapy, and Re-evaluation  PLAN FOR NEXT SESSION:   Deep core activation, lower back STM as indicated, hip strength as indicated   Golden Pop PT ,DPT Physical Therapist- [place]  [facility]   4:36 PM 07/10/23

## 2023-07-11 ENCOUNTER — Encounter (HOSPITAL_BASED_OUTPATIENT_CLINIC_OR_DEPARTMENT_OTHER): Payer: 59

## 2023-07-12 ENCOUNTER — Encounter: Payer: 59 | Admitting: Physical Therapy

## 2023-07-12 ENCOUNTER — Ambulatory Visit: Payer: 59 | Admitting: Physical Therapy

## 2023-07-12 DIAGNOSIS — M5459 Other low back pain: Secondary | ICD-10-CM

## 2023-07-12 DIAGNOSIS — M542 Cervicalgia: Secondary | ICD-10-CM

## 2023-07-12 DIAGNOSIS — M6281 Muscle weakness (generalized): Secondary | ICD-10-CM

## 2023-07-12 DIAGNOSIS — R2681 Unsteadiness on feet: Secondary | ICD-10-CM

## 2023-07-12 DIAGNOSIS — R278 Other lack of coordination: Secondary | ICD-10-CM

## 2023-07-12 NOTE — Therapy (Signed)
OUTPATIENT PHYSICAL THERAPY LUMBAR treatment/  Discharge  summary        Patient Name: [name] [name] MRN: [id number] DOB:1955/11/29, 67 y.o., female Today's Date: 07/12/2023  END OF SESSION:  PT End of Session - 07/12/23 1410     Visit Number 14    Number of Visits 16    Date for PT Re-Evaluation 08/03/23    Authorization Type UHC    Progress Note Due on Visit 18    PT Start Time 1404    PT Stop Time 1445    PT Time Calculation (min) 41 min    Equipment Utilized During Treatment Gait belt    Activity Tolerance Patient tolerated treatment well;No increased pain    Behavior During Therapy WFL for tasks assessed/performed               Past Medical History:  Diagnosis Date   Actinic keratosis    Acute hepatitis C without mention of hepatic coma(070.51)    Arthritis    fingers   Basal cell carcinoma 12/13/2022   left upper abdomen, EDC   Carpal tunnel syndrome of left wrist    Depression    GERD (gastroesophageal reflux disease)    Hypothyroidism    Insomnia    Insomnia    Memory loss    Osteoporosis    SCC (squamous cell carcinoma) 03/06/2023   left lower lateral leg, refer for Mohs   Squamous cell carcinoma of skin 12/27/2021   Left pretibial - EDC   Tremor    Wears dentures    full upper, partial lower   Past Surgical History:  Procedure Laterality Date   ANKLE SURGERY Right    BLADDER TACT     CATARACT EXTRACTION W/PHACO Left 10/13/2021   Procedure: CATARACT EXTRACTION PHACO AND INTRAOCULAR LENS PLACEMENT (IOC) LEFT;  Surgeon: Lockie Mola, MD;  Location: [facility];  Service: Ophthalmology;  Laterality: Left;  3.97 00:49.0   CATARACT EXTRACTION W/PHACO Right 10/27/2021   Procedure: CATARACT EXTRACTION PHACO AND INTRAOCULAR LENS PLACEMENT (IOC) RIGHT 4.50 00:58.9;  Surgeon: Lockie Mola, MD;  Location: [facility];  Service: Ophthalmology;  Laterality: Right;   HAND SURGERY Right    TONSILLECTOMY     Patient Active  Problem List   Diagnosis Date Noted   Hepatitis C 08/19/2016   Depression 08/19/2016   Insomnia 08/19/2016   Hypothyroidism 08/19/2016    PCP:   Salena Saner, MD    REFERRING PROVIDER:   Altamese Cabal, PA-C    REFERRING DIAG:  Diagnosis  M54.2 (ICD-10-CM) - Neck pain    THERAPY DIAG:  Other low back pain  Muscle weakness (generalized)  Unsteadiness on feet  Neck pain  Other lack of coordination  Rationale for Evaluation and Treatment: Rehabilitation  ONSET DATE: 5 months   SUBJECTIVE:  SUBJECTIVE STATEMENT: Pt reports to PT with no pain at start of PT session. Reports that her R knee was bothering her over the weekend, but feels better today. Also reports mild LBP 6/10 on Sunday that resolved in 30 min rest break.   Hand dominance: Right  PERTINENT HISTORY:   [name] [name] is a 67yoF who is referred to OPPT from orthopedics for neck pain s/p left (upper) trapezius strain. Pt familiar to our services from recent PT for BLE strength and balance July 2024. Since then pt reports increased back, knee and neck pain with increased soreness at the end of work day at [employer]. MD note also remarks on DJD of shoulder and cervical spine, recommendation for conservative management with muscle relaxer and NSAID therapies. Planning for FU at 6 weeks.   PAIN:  Are you having pain?  0/10 current  Description: aching    PRECAUTIONS: None and Other: wearing back brace for comfort   RED FLAGS: None     WEIGHT BEARING RESTRICTIONS: No  FALLS:  Has patient fallen in last 6 months? No  LIVING ENVIRONMENT: Lives with: lives alone Lives in: House/apartment Stairs: Yes: External: 5 steps; on right going up Has following equipment at home: Walker - 2 wheeled and does  not use   OCCUPATION: food set up and delivery   PLOF: Independent  PATIENT GOALS: get back stronger  NEXT MD VISIT: sept 19, [place] visit    OBJECTIVE: 11/27  LUMBAR ROM:  Active  A/PROM  eval  Flexion Hands to ankles    Extension   Right lateral flexion WNL  Left lateral flexion WNL  Right rotation [facility]  Left rotation Limited, no pain   (Blank rows = not tested)  MMT grades Right Left  Hip flexion 4+ 4+  Hip Abduction 4+ 4+ (on 10/31 in [place])  Hip Adduction 5 5  Knee Extension  5 5  Knee Flexion 5 5  DF 5 5  PF 4 4   Special tests    DLLT full ROM no pain  degrees caused pain for remainder  TODAY'S TREATMENT:                                                                                                                              DATE: 07/12/2023  Goal assessment. See below for details   PATIENT EDUCATION:  Education details: HEP education Benefits for deep core activation to improve spinal stability and reduce compensatory movement strategies.  Pt educated throughout session about proper posture and technique with exercises. Improved exercise technique, movement at target joints, use of target muscles after min to mod verbal, visual, tactile cues.    HOME EXERCISE PROGRAM: Access Code: [email or web address] URL: [email or web address] Date: 06/15/2023 Prepared by: Thresa Ross  Exercises - Supine Lower Trunk Rotation  - 1 x daily - 7 x weekly - 10 reps - 10 sec  hold - Supine Bridge with Resistance Band  - 1 x  daily - 7 x weekly - 2 sets - 8 reps - Supine Transversus Abdominis Bracing - Hands on Stomach  - 1 x daily - 7 x weekly - 3 sets - 10 reps - Supine Straight Leg Raises  - 1 x daily - 7 x weekly - 2 sets - 8 reps  ASSESSMENT:  CLINICAL IMPRESSION:  Continued with current plan of care as laid out in evaluation and recent prior sessions. Due to reduced work hours, pt reports decreased pain in low back, but occasional stiffness prior to PT  treatment. PT treatment focused on improved hip mobility, core stability and cervical ROM. Due to reduced pain with daily task, pt me be near end of benefit from skilled PT. Pt closely monitored throughout session pt response and to maximize patient safety during interventions. Pt continues to demonstrate progress toward goals AEB progression of interventions this date either in volume or intensity.  OBJECTIVE IMPAIRMENTS: decreased ROM, decreased strength, hypomobility, increased fascial restrictions, increased muscle spasms, impaired flexibility, improper body mechanics, postural dysfunction, and pain.   ACTIVITY LIMITATIONS: carrying, lifting, standing, squatting, sleeping, reach over head, and hygiene/grooming  PARTICIPATION LIMITATIONS: laundry, driving, shopping, community activity, and occupation  PERSONAL FACTORS: Age, Behavior pattern, Education, Fitness, Past/current experiences, and 1 comorbidity: LBP knee pain  are also affecting patient's functional outcome.   REHAB POTENTIAL: Good  CLINICAL DECISION MAKING: Stable/uncomplicated  EVALUATION COMPLEXITY: Moderate   GOALS: Goals reviewed with patient? Yes  SHORT TERM GOALS: Target date: 05/31/2023  Pt will be independent with HEP to address cervical stiffness/pain without assist from PT Baseline: to be given at session 2.  10/24: been completing, pain has been improved   Goal status: pending reassessment   LONG TERM GOALS: Target date: 06/22/2023   Pt will increased FOTO to >66 to indicate improved function and reduced impairment from cervical pain  Baseline: 60 10/24:66 11/27: 66 Goal status: Met goal for cervical F OTO  2.  Pt will improve NDI by at least 5 pts to demonstrate improved function with ADLs and reduced disability as result of neck pain.  Baseline: 7/50 10/24: 5/50 Goal status: partially met  3.  Pt will improve cervical rotation to at least 55 deg to improve safety with driving Bil  Baseline: 45 L, 52 R   11/27: 55 deg bil  Goal status: Met  4.  Pt will increase lateral flexion in Cspine by at least 15 deg on the L to improve safety and reduced pain with work tasks of moving and preparing food at [employer]  Baseline: L 8 deg L 25 R 35 Goal status: MET   5.  Pt will report pain in neck 2/10 at worst to improve QoL and improve tolerance to work related tasks  Baseline: 6/10  10/24:  No neck pain in last several days  Goal status:MET  6.  Patient will improve modified Oswestry back pain disability questionnaire by 5 points or greater to indicate improved subjective level of back pain with functional activities Baseline: 10/50 11/27: 9/50  Goal status: IN PROGRESS  7.  Patient will improve double leg limb lowering test by 20 degrees or greater indicating improved spinal stability as well as improved core muscle activation. Baseline: 75 degrees ( 15 form 90) 11/27: full ROM no pain.  Goal status: MET  8.  Patient will report improved back pain as evidenced by pain level of 3 of 10 or less on visual analog pain scale in order to indicate improved quality of  life Baseline: 7/10 when bending or stooping  11/27: 0/10 at PT session for 2-3 weeks  Goal status: MET    PLAN:  PT FREQUENCY: 1-2x/week  PT DURATION: 8 weeks  PLANNED INTERVENTIONS: Therapeutic exercises, Therapeutic activity, Neuromuscular re-education, Balance training, Gait training, Patient/Family education, Self Care, Joint mobilization, Joint manipulation, Stair training, Dry Needling, Electrical stimulation, Spinal manipulation, Spinal mobilization, Cryotherapy, Moist heat, scar mobilization, Taping, Traction, Ultrasound, Ionotophoresis 4mg /ml Dexamethasone, Manual therapy, and Re-evaluation  PLAN FOR NEXT SESSION:   D/c from PT   Golden Pop PT ,DPT Physical Therapist- [facility]  [facility]   2:14 PM 07/12/23

## 2023-07-17 ENCOUNTER — Ambulatory Visit: Payer: 59 | Admitting: Physical Therapy

## 2023-07-17 ENCOUNTER — Encounter: Payer: 59 | Admitting: Physical Therapy

## 2023-07-19 ENCOUNTER — Encounter: Payer: 59 | Admitting: Physical Therapy

## 2023-07-19 ENCOUNTER — Ambulatory Visit: Payer: 59 | Admitting: Physical Therapy

## 2023-07-24 ENCOUNTER — Ambulatory Visit: Payer: 59 | Admitting: Physical Therapy

## 2023-07-24 ENCOUNTER — Encounter: Payer: 59 | Admitting: Physical Therapy

## 2023-07-26 ENCOUNTER — Encounter: Payer: 59 | Admitting: Physical Therapy

## 2023-07-31 ENCOUNTER — Encounter: Payer: 59 | Admitting: Physical Therapy

## 2023-08-02 ENCOUNTER — Encounter: Payer: 59 | Admitting: Physical Therapy

## 2023-08-07 ENCOUNTER — Encounter: Payer: 59 | Admitting: Physical Therapy

## 2023-08-15 ENCOUNTER — Encounter: Payer: 59 | Admitting: Physical Therapy

## 2023-08-15 ENCOUNTER — Other Ambulatory Visit: Payer: Self-pay | Admitting: Pulmonary Disease

## 2023-08-15 DIAGNOSIS — K219 Gastro-esophageal reflux disease without esophagitis: Secondary | ICD-10-CM

## 2023-08-22 ENCOUNTER — Encounter: Payer: 59 | Admitting: Physical Therapy

## 2023-08-24 ENCOUNTER — Encounter: Payer: 59 | Admitting: Physical Therapy

## 2023-08-30 ENCOUNTER — Encounter: Payer: 59 | Admitting: Physical Therapy

## 2023-09-04 ENCOUNTER — Encounter: Payer: 59 | Admitting: Physical Therapy

## 2023-09-06 ENCOUNTER — Encounter: Payer: 59 | Admitting: Physical Therapy

## 2023-09-11 ENCOUNTER — Telehealth: Payer: Self-pay | Admitting: Pulmonary Disease

## 2023-09-11 ENCOUNTER — Encounter: Payer: 59 | Admitting: Physical Therapy

## 2023-09-11 NOTE — Telephone Encounter (Signed)
PT has 2/14 appt and needs a PFT at [facility] sched. I said we'd be calling her but she is anxious to set the appt because "I have been waiting over 6 months for this appt." Her # is [phone]

## 2023-09-13 ENCOUNTER — Encounter: Payer: 59 | Admitting: Physical Therapy

## 2023-09-19 ENCOUNTER — Encounter: Payer: 59 | Admitting: Physical Therapy

## 2023-09-21 ENCOUNTER — Encounter: Payer: 59 | Admitting: Physical Therapy

## 2023-09-21 NOTE — Telephone Encounter (Signed)
 NFN

## 2023-09-22 NOTE — Telephone Encounter (Signed)
 There are no more PFT appts left before her appt that is why she hasn't been scheduled

## 2023-09-25 ENCOUNTER — Other Ambulatory Visit: Payer: Self-pay | Admitting: Family Medicine

## 2023-09-25 DIAGNOSIS — Z1231 Encounter for screening mammogram for malignant neoplasm of breast: Secondary | ICD-10-CM

## 2023-09-26 ENCOUNTER — Encounter: Payer: 59 | Admitting: Physical Therapy

## 2023-09-28 ENCOUNTER — Encounter: Payer: 59 | Admitting: Physical Therapy

## 2023-09-29 ENCOUNTER — Ambulatory Visit: Payer: 59 | Admitting: Pulmonary Disease

## 2023-09-29 ENCOUNTER — Encounter: Payer: Self-pay | Admitting: Pulmonary Disease

## 2023-09-29 VITALS — BP 122/76 | HR 80 | Temp 97.6°F | Ht 65.0 in | Wt 174.0 lb

## 2023-09-29 DIAGNOSIS — R053 Chronic cough: Secondary | ICD-10-CM

## 2023-09-29 DIAGNOSIS — K219 Gastro-esophageal reflux disease without esophagitis: Secondary | ICD-10-CM

## 2023-09-29 DIAGNOSIS — R0609 Other forms of dyspnea: Secondary | ICD-10-CM | POA: Diagnosis not present

## 2023-09-29 LAB — NITRIC OXIDE: Nitric Oxide: 27

## 2023-09-29 NOTE — Progress Notes (Signed)
Subjective:    Patient ID: [name] [name], female    DOB: 01/17/1956, 68 y.o.   MRN: [id number]  Patient Care Team: Leanna Sato, MD as PCP - General (Family Medicine) Salena Saner, MD as Consulting Physician (Pulmonary Disease)  Chief Complaint  Patient presents with   Follow-up    Shortness of breath on exertion and occasional cough.    BACKGROUND/INTERVAL: Patient is a 68 year old who follows up on the issue of cough.  Cough mostly in the evenings and related to gastroesophageal reflux.  HPI Discussed the use of AI scribe software for clinical note transcription with the patient, who gave verbal consent to proceed.  History of Present Illness   [name] [name] is a 68 year old female who presents for follow-up on cough.  She has mild shortness of breath on exertion which is chronic in nature.  Previously treated with inhalers during acute bronchospasm while she was suffering from pneumonia.   She experiences shortness of breath primarily during exertion, such as walking.  However, she does not feel that this is limiting to her.  She does not currently use an inhaler having discontinued it in the past as she did not find it useful.  She occasionally has a cough.  This has gotten better on more aggressive management of her gastroesophageal reflux.  A pulmonary function test has not been completed due to scheduling issues at the pulmonary lab.  She has a history of gastroesophageal reflux disease (GERD). She supplements her medication with over-the-counter omeprazole, taking 40 mg in the morning and 40 mg in the evening, which helps her symptoms. She was advised by a gastroenterologist to increase her Nexium (esomeprazole) dosage by an additional 40 mg, but the prescription was not sent to the pharmacy. She has not undergone an EGD or colonoscopy, although these were recommended by her gastroenterologist. She previously had a negative Cologuard test and has not returned to the  gastroenterologist since.   She does not endorse any other symptomatology today.     Review of Systems A 10 point review of systems was performed and it is as noted above otherwise negative.   Patient Active Problem List   Diagnosis Date Noted   Hepatitis C 08/19/2016   Depression 08/19/2016   Insomnia 08/19/2016   Hypothyroidism 08/19/2016    Social History   Tobacco Use   Smoking status: Former    Current packs/day: 0.00    Average packs/day: 1 pack/day for 10.0 years (10.0 ttl pk-yrs)    Types: Cigarettes    Start date: 28    Quit date: 1983    Years since quitting: 42.1   Smokeless tobacco: Never  Substance Use Topics   Alcohol use: No    Allergies  Allergen Reactions   Sonata [Zaleplon]     Took with another medication with this and it caused hallucinations.   Carbidopa-Levodopa Nausea Only    Current Meds  Medication Sig   amitriptyline (ELAVIL) 50 MG tablet Take 150 mg by mouth at bedtime as needed for sleep.    ARIPiprazole (ABILIFY) 10 MG tablet Take 10 mg by mouth daily.   buPROPion (WELLBUTRIN SR) 150 MG 12 hr tablet Take 150 mg by mouth 3 (three) times daily.    calcium carbonate (OS-CAL) 600 MG TABS tablet Take 600 mg by mouth 2 (two) times daily with a meal.   donepezil (ARICEPT) 10 MG tablet Take 10 mg by mouth at bedtime.   esomeprazole (NEXIUM) 40 MG  capsule Take 1 capsule (40 mg total) by mouth 2 (two) times daily before a meal.   levothyroxine (SYNTHROID) 112 MCG tablet Take 112 mcg by mouth daily before breakfast.   methocarbamol (ROBAXIN) 500 MG tablet Take 1 tablet by mouth at bedtime.   Multiple Vitamins-Minerals (MULTIVITAL-M) TABS Take 1 tablet by mouth daily.    mupirocin ointment (BACTROBAN) 2 % Apply 1 Application topically daily. Qd to wound on left lower leg until resolved   ondansetron (ZOFRAN) 8 MG tablet Take 8 mg by mouth 3 (three) times daily.   Venlafaxine HCl 150 MG TB24 Take 2 tablets by mouth daily.    Immunization History   Administered Date(s) Administered   Fluad Trivalent(High Dose 65+) 05/16/2023   Influenza-Unspecified 05/15/2022   Tdap 07/02/2021   Zoster Recombinant(Shingrix) 07/02/2021        Objective:     BP 122/76 (BP Location: Right Arm, Patient Position: Sitting, Cuff Size: Normal)   Pulse 80   Temp 97.6 F (36.4 C) (Temporal)   Ht 5\' 5"  (1.651 m)   Wt 174 lb (78.9 kg)   SpO2 97%   BMI 28.96 kg/m   SpO2: 97 %  GENERAL: Well-developed, overweight woman, no acute distress.  Fully ambulatory.  No conversational dyspnea. HEAD: Normocephalic, atraumatic.  EYES: Pupils equal, round, reactive to light.  No scleral icterus.  MOUTH: Poor dentition, mouth malocclusion. NECK: Supple. No thyromegaly. Trachea midline. No JVD.  No adenopathy. PULMONARY: Good air entry bilaterally.  Coarse, otherwise no adventitious sounds. CARDIOVASCULAR: S1 and S2. Regular rate and rhythm.  No rubs, murmurs or gallops heard. ABDOMEN: Benign. MUSCULOSKELETAL: No joint deformity, no clubbing, no edema.  NEUROLOGIC: No overt focal deficit, no gait disturbance, speech is fluent. SKIN: Intact,warm,dry.  Multiple actinic keratoses. PSYCH: Flat affect.  Appears befuddled at times  Lab Results  Component Value Date   NITRICOXIDE 27 09/29/2023     Assessment & Plan:     ICD-10-CM   1. Dyspnea on exertion  R06.09 Nitric oxide    2. Gastroesophageal reflux disease, unspecified whether esophagitis present  K21.9     3. Chronic cough  R05.3       Orders Placed This Encounter  Procedures   Nitric oxide    Discussion:    Shortness of Breath Intermittent dyspnea on exertion. No airway inflammation or acute distress noted. Discussed limited testing slots for PFTs and plan to train in-house lab personnel to alleviate scheduling issues. - No evidence of type II inflammation - Schedule pulmonary function test, this was ordered in October needs to be realized - Follow-up in six months or as  needed  Gastroesophageal Reflux Disease (GERD) Ongoing GERD symptoms. Currently taking OTC omeprazole 40 mg BID. Previous Nexium generic 40 mg prescription not filled. Discussed need for EGD to evaluate upper GI symptoms and patient's preference to avoid colonoscopy given negative Cologuard test. - Schedule appointment with gastroenterologist for reflux management and potential medication adjustments - Discuss need for EGD with gastroenterologist  General Health Maintenance Negative Cologuard test. Patient prefers to avoid colonoscopy at this time. - Discuss with gastroenterologist the need for colonoscopy if indicated  Follow-up - Provide instructions in person - Schedule follow-up appointment in six months.      Advised if symptoms do not improve or worsen, to please contact office for sooner follow up or seek emergency care.    I spent 32 minutes of dedicated to the care of this patient on the date of this encounter to include pre-visit  review of records, face-to-face time with the patient discussing conditions above, post visit ordering of testing, clinical documentation with the electronic health record, making appropriate referrals as documented, and communicating necessary findings to members of the patients care team.     C. Danice Goltz, MD Advanced Bronchoscopy [facility]    *This note was generated using voice recognition software/Dragon and/or AI transcription program.  Despite best efforts to proofread, errors can occur which can change the meaning. Any transcriptional errors that result from this process are unintentional and may not be fully corrected at the time of dictation.

## 2023-09-29 NOTE — Patient Instructions (Signed)
Your lungs sounded clear today.  There was no evidence of inflammation in your airway.  We are going to schedule you for the breathing test that were ordered back in October.  We will see you in follow-up in 6 months time call sooner should any new problems arise.  We will have the breathing test done as soon as you can be scheduled.

## 2023-10-03 ENCOUNTER — Encounter: Payer: 59 | Admitting: Physical Therapy

## 2023-10-05 ENCOUNTER — Encounter: Payer: 59 | Admitting: Physical Therapy

## 2023-10-17 ENCOUNTER — Ambulatory Visit
Admission: RE | Admit: 2023-10-17 | Discharge: 2023-10-17 | Disposition: A | Discharge status: Home / Self Care | Payer: 59 | Source: Ambulatory Visit | Attending: Family Medicine | Admitting: Family Medicine

## 2023-10-17 DIAGNOSIS — Z1231 Encounter for screening mammogram for malignant neoplasm of breast: Secondary | ICD-10-CM | POA: Insufficient documentation

## 2023-11-06 ENCOUNTER — Telehealth: Payer: Self-pay | Admitting: Gastroenterology

## 2023-11-06 ENCOUNTER — Telehealth: Payer: Self-pay | Admitting: Pulmonary Disease

## 2023-11-06 DIAGNOSIS — R0602 Shortness of breath: Secondary | ICD-10-CM

## 2023-11-06 NOTE — Telephone Encounter (Signed)
 If she is willing to go to [facility] would you put in PFT order for [facility] and I will get her scheduled

## 2023-11-06 NOTE — Telephone Encounter (Signed)
 The patient called to schedule her follow-up appointment with Dr. Allegra Lai on 12/28/23 at 2:45 PM. I informed her that a reminder will be sent out in the mail.

## 2023-11-06 NOTE — Telephone Encounter (Signed)
 Pt is asking why cough is still occuring

## 2023-11-06 NOTE — Telephone Encounter (Signed)
New PFT order has been placed.

## 2023-11-06 NOTE — Telephone Encounter (Signed)
 She has not completed the testing previously requested.  First testing would be the lung function test ordered in October 2024.  This would tell me about airways reactivity.  In the past, her cough responded to inhalers but she quit taking them.  However, it would really be good to have the breathing test so we could tell what else is going on.  In addition she has very severe gastroesophageal reflux and she really should have that evaluated more carefully by her gastroenterologist.  This can cause severe cough.  If she needs a reassessment of the cough she should make an appointment so we can go over these things more in-depth.

## 2023-11-06 NOTE — Telephone Encounter (Signed)
 I have notified the patient. She is wanting to have her PFT done ASAP to see what is going on. She said she is willing to drive to [facility] to have it done.  Synetta Fail please schedule her PFT ASAP. Thank you!

## 2023-11-07 NOTE — Telephone Encounter (Signed)
 I have left a message asking the patient to call me back to schedule her PFT in [place] if she is willing

## 2023-11-13 ENCOUNTER — Telehealth: Payer: Self-pay | Admitting: Pulmonary Disease

## 2023-11-13 NOTE — Telephone Encounter (Signed)
 I have called Mrs. Shambaugh and she confirmed she is willing to get her PFT done at [facility]. I have left a message with respiratory to call and scheduled for the patient

## 2023-11-14 NOTE — Telephone Encounter (Signed)
 Mrs. Knobloch has her PFT scheduled on 11/16/23 @ 2:00pm at [facility]. Cordelia Pen the RT scheduled for the patient

## 2023-11-16 ENCOUNTER — Encounter (HOSPITAL_COMMUNITY)

## 2023-11-20 ENCOUNTER — Inpatient Hospital Stay (HOSPITAL_COMMUNITY): Admission: RE | Admit: 2023-11-20 | Source: Ambulatory Visit

## 2023-11-27 ENCOUNTER — Telehealth: Payer: Self-pay

## 2023-11-27 ENCOUNTER — Ambulatory Visit (HOSPITAL_COMMUNITY)
Admission: RE | Admit: 2023-11-27 | Discharge: 2023-11-27 | Disposition: A | Discharge status: Home / Self Care | Source: Ambulatory Visit | Attending: Pulmonary Disease | Admitting: Pulmonary Disease

## 2023-11-27 DIAGNOSIS — R0602 Shortness of breath: Secondary | ICD-10-CM | POA: Diagnosis present

## 2023-11-27 LAB — PULMONARY FUNCTION TEST
DL/VA % pred: 112 %
DL/VA: 4.63 ml/min/mmHg/L
DLCO unc % pred: 75 %
DLCO unc: 15.25 ml/min/mmHg
FEF 25-75 Post: 2.49 L/s
FEF 25-75 Pre: 2.32 L/s
FEF2575-%Change-Post: 6 %
FEF2575-%Pred-Post: 124 %
FEF2575-%Pred-Pre: 115 %
FEV1-%Change-Post: 0 %
FEV1-%Pred-Post: 79 %
FEV1-%Pred-Pre: 78 %
FEV1-Post: 1.9 L
FEV1-Pre: 1.89 L
FEV1FVC-%Change-Post: 0 %
FEV1FVC-%Pred-Pre: 113 %
FEV6-%Change-Post: 0 %
FEV6-%Pred-Post: 72 %
FEV6-%Pred-Pre: 72 %
FEV6-Post: 2.19 L
FEV6-Pre: 2.19 L
FEV6FVC-%Pred-Post: 104 %
FEV6FVC-%Pred-Pre: 104 %
FVC-%Change-Post: 0 %
FVC-%Pred-Post: 69 %
FVC-%Pred-Pre: 69 %
FVC-Post: 2.19 L
FVC-Pre: 2.19 L
Post FEV1/FVC ratio: 86 %
Post FEV6/FVC ratio: 100 %
Pre FEV1/FVC ratio: 86 %
Pre FEV6/FVC Ratio: 100 %
RV % pred: 49 %
RV: 1.11 L
TLC % pred: 66 %
TLC: 3.48 L

## 2023-11-27 MED ORDER — ALBUTEROL SULFATE (2.5 MG/3ML) 0.083% IN NEBU
2.5000 mg | INHALATION_SOLUTION | Freq: Once | RESPIRATORY_TRACT | Status: AC
  Administered 2023-11-27: 2.5 mg via RESPIRATORY_TRACT

## 2023-11-27 NOTE — Telephone Encounter (Signed)
Patient had PFT done today.

## 2023-11-27 NOTE — Telephone Encounter (Signed)
 Copied from CRM [id number]. Topic: Clinical - Medical Advice >> Nov 27, 2023  3:54 PM Isabell A wrote: Reason for CRM: Patient calling to let Dr.Gonzalez know she has completed her breathing treatment.

## 2023-11-27 NOTE — Telephone Encounter (Signed)
 Noted.

## 2023-12-05 ENCOUNTER — Encounter: Payer: Self-pay | Admitting: Psychiatry

## 2023-12-05 ENCOUNTER — Ambulatory Visit (INDEPENDENT_AMBULATORY_CARE_PROVIDER_SITE_OTHER): Payer: Self-pay | Admitting: Psychiatry

## 2023-12-05 VITALS — BP 122/76 | HR 101 | Temp 97.8°F | Ht 65.0 in | Wt 178.8 lb

## 2023-12-05 DIAGNOSIS — G4700 Insomnia, unspecified: Secondary | ICD-10-CM | POA: Diagnosis not present

## 2023-12-05 DIAGNOSIS — G3184 Mild cognitive impairment, so stated: Secondary | ICD-10-CM | POA: Diagnosis not present

## 2023-12-05 DIAGNOSIS — Z79899 Other long term (current) drug therapy: Secondary | ICD-10-CM | POA: Diagnosis not present

## 2023-12-05 DIAGNOSIS — F331 Major depressive disorder, recurrent, moderate: Secondary | ICD-10-CM | POA: Diagnosis not present

## 2023-12-05 MED ORDER — RAMELTEON 8 MG PO TABS: 8.0000 mg | ORAL_TABLET | Freq: Every evening | ORAL | 1 refills | Status: DC | PRN
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
## 2023-12-05 NOTE — Patient Instructions (Addendum)
 Continue venlafaxine 300 mg daily Continue bupropion 300 mg in AM, 150 mg in PM Continue Abilify 10 mg daily Decrease amitriptyline 100 mg at night for two weeks, then 50 mg at night for two weeks, then discontinue  Obtain EKG - please call [phone] to make an appointment  Referral to therapy- onsite Next appointment- 6/5 at 2:30

## 2023-12-08 ENCOUNTER — Other Ambulatory Visit: Payer: Self-pay | Admitting: Orthopedic Surgery

## 2023-12-08 DIAGNOSIS — Z96652 Presence of left artificial knee joint: Secondary | ICD-10-CM

## 2023-12-14 ENCOUNTER — Ambulatory Visit
Admission: RE | Admit: 2023-12-14 | Discharge: 2023-12-14 | Disposition: A | Discharge status: Home / Self Care | Source: Ambulatory Visit | Attending: Psychiatry | Admitting: Psychiatry

## 2023-12-14 DIAGNOSIS — I4510 Unspecified right bundle-branch block: Secondary | ICD-10-CM | POA: Insufficient documentation

## 2023-12-14 DIAGNOSIS — Z79899 Other long term (current) drug therapy: Secondary | ICD-10-CM | POA: Diagnosis present

## 2023-12-14 DIAGNOSIS — Z5181 Encounter for therapeutic drug level monitoring: Secondary | ICD-10-CM | POA: Diagnosis not present

## 2023-12-16 NOTE — Progress Notes (Signed)
 Please inform the patient that the EKG shows a borderline QTc range. This finding does not require any immediate change, but we will continue to monitor it over time as a precaution. At this point, I recommend continuing the current medication as discussed until the next visit.

## 2023-12-19 ENCOUNTER — Ambulatory Visit: Payer: 59 | Admitting: Dermatology

## 2023-12-19 ENCOUNTER — Telehealth: Payer: Self-pay

## 2023-12-19 DIAGNOSIS — L719 Rosacea, unspecified: Secondary | ICD-10-CM

## 2023-12-19 DIAGNOSIS — W908XXA Exposure to other nonionizing radiation, initial encounter: Secondary | ICD-10-CM | POA: Diagnosis not present

## 2023-12-19 DIAGNOSIS — Z1283 Encounter for screening for malignant neoplasm of skin: Secondary | ICD-10-CM

## 2023-12-19 DIAGNOSIS — L820 Inflamed seborrheic keratosis: Secondary | ICD-10-CM | POA: Diagnosis not present

## 2023-12-19 DIAGNOSIS — L570 Actinic keratosis: Secondary | ICD-10-CM

## 2023-12-19 DIAGNOSIS — Q828 Other specified congenital malformations of skin: Secondary | ICD-10-CM

## 2023-12-19 DIAGNOSIS — L814 Other melanin hyperpigmentation: Secondary | ICD-10-CM

## 2023-12-19 DIAGNOSIS — Z85828 Personal history of other malignant neoplasm of skin: Secondary | ICD-10-CM

## 2023-12-19 DIAGNOSIS — L853 Xerosis cutis: Secondary | ICD-10-CM

## 2023-12-19 DIAGNOSIS — L821 Other seborrheic keratosis: Secondary | ICD-10-CM

## 2023-12-19 DIAGNOSIS — L578 Other skin changes due to chronic exposure to nonionizing radiation: Secondary | ICD-10-CM | POA: Diagnosis not present

## 2023-12-19 DIAGNOSIS — D229 Melanocytic nevi, unspecified: Secondary | ICD-10-CM

## 2023-12-19 DIAGNOSIS — L304 Erythema intertrigo: Secondary | ICD-10-CM

## 2023-12-19 DIAGNOSIS — B351 Tinea unguium: Secondary | ICD-10-CM

## 2023-12-19 DIAGNOSIS — D1801 Hemangioma of skin and subcutaneous tissue: Secondary | ICD-10-CM

## 2023-12-19 MED ORDER — NYSTATIN 100000 UNIT/GM EX CREA: TOPICAL_CREAM | CUTANEOUS | 5 refills | Status: AC

## 2023-12-19 MED ORDER — FLUCONAZOLE 200 MG PO TABS: 200.0000 mg | ORAL_TABLET | Freq: Every day | ORAL | 0 refills | Status: DC

## 2023-12-19 MED ORDER — METRONIDAZOLE 0.75 % EX CREA: TOPICAL_CREAM | CUTANEOUS | 11 refills | Status: DC

## 2023-12-19 NOTE — Progress Notes (Signed)
 Follow-Up Visit   Subjective  [name] [name] is a 68 y.o. female who presents for the following: Skin Cancer Screening and Full Body Skin Exam  The patient presents for Total-Body Skin Exam (TBSE) for skin cancer screening and mole check. The patient has spots, moles and lesions to be evaluated, some may be new or changing. She has a scaly spot right post ankle, lower back. She has a rash under the right breast x 3 days. History of BCC and SCC.  The following portions of the chart were reviewed this encounter and updated as appropriate: medications, allergies, medical history  Review of Systems:  No other skin or systemic complaints except as noted in HPI or Assessment and Plan.  Objective  Well appearing patient in no apparent distress; mood and affect are within normal limits.  A full examination was performed including scalp, head, eyes, ears, nose, lips, neck, chest, axillae, abdomen, back, buttocks, bilateral upper extremities, bilateral lower extremities, hands, feet, fingers, toes, fingernails, and toenails. All findings within normal limits unless otherwise noted below.   Relevant physical exam findings are noted in the Assessment and Plan.  nasal dorsum Pink scaly macule.  Left Lower Back Erythematous stuck-on, waxy papule or plaque  Assessment & Plan   SKIN CANCER SCREENING PERFORMED TODAY.  ACTINIC DAMAGE - Chronic condition, secondary to cumulative UV/sun exposure - diffuse scaly erythematous macules with underlying dyspigmentation - Recommend daily broad spectrum sunscreen SPF 30+ to sun-exposed areas, reapply every 2 hours as needed.  - Staying in the shade or wearing long sleeves, sun glasses (UVA+UVB protection) and wide brim hats (4-inch brim around the entire circumference of the hat) are also recommended for sun protection.  - Call for new or changing lesions.  LENTIGINES, SEBORRHEIC KERATOSES, HEMANGIOMAS - Benign normal skin lesions - Benign-appearing -  Call for any changes  MELANOCYTIC NEVI - Tan-brown and/or pink-flesh-colored symmetric macules and papules - Benign appearing on exam today - Observation - Call clinic for new or changing moles - Recommend daily use of broad spectrum spf 30+ sunscreen to sun-exposed areas.   HISTORY OF BASAL CELL CARCINOMA OF THE SKIN Left upper abdomen EDC done 12/13/2022 - No evidence of recurrence today - Recommend regular full body skin exams - Recommend daily broad spectrum sunscreen SPF 30+ to sun-exposed areas, reapply every 2 hours as needed.  - Call if any new or changing lesions are noted between office visits  HISTORY OF SQUAMOUS CELL CARCINOMA OF THE SKIN 03/06/23 Left lower lateral leg- clear at this time at exam today with biopsy only. 12/27/21 left pretibial ED&C site clear - No evidence of recurrence today - Recommend regular full body skin exams - Recommend daily broad spectrum sunscreen SPF 30+ to sun-exposed areas, reapply every 2 hours as needed.  - Call if any new or changing lesions are noted between office visits  POROKERATOSIS Exam: L post ankle with 1.2 cm light pink patch with keratotic rim.  Benign-appearing.  Observation.  Call clinic for new or changing lesions.  Recommend daily use of broad spectrum spf 30+ sunscreen to sun-exposed areas.   INTERTRIGO Exam: Erythematous macerated patches in body folds  Chronic and persistent condition with duration or expected duration over one year. Condition is bothersome/symptomatic for patient. Currently flared.   Intertrigo is a chronic recurrent rash that occurs in skin fold areas that may be associated with friction; heat; moisture; yeast; fungus; and bacteria.  It is exacerbated by increased movement / activity; sweating; and higher  atmospheric temperature.  Use of an absorbant powder such as Zeasorb AF powder or other OTC antifungal powder to the area daily can prevent rash recurrence. Other options to help keep the area dry  include blow drying the area after bathing or using antiperspirant products such as Duradry sweat minimizing gel.  Treatment Plan: Start Nystatin cream apply to aa skin folds twice daily as needed for flares dsp 30 g 5Rf. Patient prefers Nystatin over Ketoconazole  cream.    ROSACEA Exam Erythema with telangiectasias R > L cheek and nose  Chronic and persistent condition with duration or expected duration over one year. Condition is bothersome/symptomatic for patient. Currently flared.   Rosacea is a chronic progressive skin condition usually affecting the face of adults, causing redness and/or acne bumps. It is treatable but not curable. It sometimes affects the eyes (ocular rosacea) as well. It may respond to topical and/or systemic medication and can flare with stress, sun exposure, alcohol, exercise, topical steroids (including hydrocortisone/cortisone 10) and some foods.  Daily application of broad spectrum spf 30+ sunscreen to face is recommended to reduce flares.  Treatment Plan Start metronidazole 0.75% cream to cheeks and nose at bedtime, use twice daily for flares dsp 45g 1 yr Rf.    ONYCHOMYCOSIS Exam: Thickened toenails with subungal debris c/w onychomycosis  Chronic and persistent condition with duration or expected duration over one year. Condition is symptomatic/ bothersome to patient. Not currently at goal.  Treatment Plan: LFTs reviewed from 12/13/2021, wnl. Discussed terbinafine vs fluconazole. Start fluconazole 200 MG take 1 po once a week (Rx written 1 po every day) dsp #30 0Rf.  Side effects of fluconazole (diflucan) include nausea, diarrhea, headache, dizziness, taste changes, rare risk of irritation of the liver, allergy, or decreased blood counts (which could show up as infection or tiredness).   Xerosis - diffuse xerotic patches - recommend gentle, hydrating skin care - gentle skin care handout given  AK (ACTINIC KERATOSIS) nasal dorsum Actinic keratoses  are precancerous spots that appear secondary to cumulative UV radiation exposure/sun exposure over time. They are chronic with expected duration over 1 year. A portion of actinic keratoses will progress to squamous cell carcinoma of the skin. It is not possible to reliably predict which spots will progress to skin cancer and so treatment is recommended to prevent development of skin cancer.  Recommend daily broad spectrum sunscreen SPF 30+ to sun-exposed areas, reapply every 2 hours as needed.  Recommend staying in the shade or wearing long sleeves, sun glasses (UVA+UVB protection) and wide brim hats (4-inch brim around the entire circumference of the hat). Call for new or changing lesions. Destruction of lesion - nasal dorsum  Destruction method: cryotherapy   Informed consent: discussed and consent obtained   Lesion destroyed using liquid nitrogen: Yes   Region frozen until ice ball extended beyond lesion: Yes   Outcome: patient tolerated procedure well with no complications   Post-procedure details: wound care instructions given   Additional details:  Prior to procedure, discussed risks of blister formation, small wound, skin dyspigmentation, or rare scar following cryotherapy. Recommend Vaseline ointment to treated areas while healing.  INFLAMED SEBORRHEIC KERATOSIS Left Lower Back Symptomatic, irritating, patient would like treated. Destruction of lesion - Left Lower Back  Destruction method: cryotherapy   Informed consent: discussed and consent obtained   Lesion destroyed using liquid nitrogen: Yes   Region frozen until ice ball extended beyond lesion: Yes   Outcome: patient tolerated procedure well with no complications   Post-procedure  details: wound care instructions given   Additional details:  Prior to procedure, discussed risks of blister formation, small wound, skin dyspigmentation, or rare scar following cryotherapy. Recommend Vaseline ointment to treated areas while  healing.  Return in about 6 months (around 06/20/2024) for Toenails, Intertrigo.  IBernardine Bridegroom, CMA, am acting as scribe for Artemio Larry, MD .   Documentation: I have reviewed the above documentation for accuracy and completeness, and I agree with the above.  Artemio Larry, MD

## 2023-12-19 NOTE — Patient Instructions (Addendum)
 Start Fluconazole 200 MG take 1 tablet by mouth once a week. Prescription will be written to take daily.  Side effects of fluconazole (diflucan) include nausea, diarrhea, headache, dizziness, taste changes, rare risk of irritation of the liver, allergy, or decreased blood counts (which could show up as infection or tiredness).   Cryotherapy Aftercare  Wash gently with soap and water everyday.   Apply Vaseline and Band-Aid daily until healed.     Gentle Skin Care Guide  1. Bathe no more than once a day.  2. Avoid bathing in hot water  3. Use a mild soap like Dove, Vanicream, Cetaphil, CeraVe. Can use Lever 2000 or Cetaphil antibacterial soap  4. Use soap only where you need it. On most days, use it under your arms, between your legs, and on your feet. Let the water rinse other areas unless visibly dirty.  5. When you get out of the bath/shower, use a towel to gently blot your skin dry, don't rub it.  6. While your skin is still a little damp, apply a moisturizing cream such as Vanicream, CeraVe, Cetaphil, Eucerin, Sarna lotion or plain Vaseline Jelly. For hands apply Neutrogena [place] Hand Cream or Excipial Hand Cream.  7. Reapply moisturizer any time you start to itch or feel dry.  8. Sometimes using free and clear laundry detergents can be helpful. Fabric softener sheets should be avoided. Downy Free & Gentle liquid, or any liquid fabric softener that is free of dyes and perfumes, it acceptable to use  9. If your doctor has given you prescription creams you may apply moisturizers over them     Due to recent changes in healthcare laws, you may see results of your pathology and/or laboratory studies on MyChart before the doctors have had a chance to review them. We understand that in some cases there may be results that are confusing or concerning to you. Please understand that not all results are received at the same time and often the doctors may need to interpret multiple  results in order to provide you with the best plan of care or course of treatment. Therefore, we ask that you please give us  2 business days to thoroughly review all your results before contacting the office for clarification. Should we see a critical lab result, you will be contacted sooner.   If You Need Anything After Your Visit  If you have any questions or concerns for your doctor, please call our main line at [phone] and press option 4 to reach your doctor's medical assistant. If no one answers, please leave a voicemail as directed and we will return your call as soon as possible. Messages left after 4 pm will be answered the following business day.   You may also send us  a message via MyChart. We typically respond to MyChart messages within 1-2 business days.  For prescription refills, please ask your pharmacy to contact our office. Our fax number is [phone].  If you have an urgent issue when the clinic is closed that cannot wait until the next business day, you can page your doctor at the number below.    Please note that while we do our best to be available for urgent issues outside of office hours, we are not available 24/7.   If you have an urgent issue and are unable to reach us , you may choose to seek medical care at your doctor's office, retail clinic, urgent care center, or emergency room.  If you have a medical emergency,  please immediately call 911 or go to the emergency department.  Pager Numbers  - Dr. Bary Likes: [phone]  - Dr. Annette Barters: [phone]  - Dr. Felipe Horton: [phone]   In the event of inclement weather, please call our main line at [phone] for an update on the status of any delays or closures.  Dermatology Medication Tips: Please keep the boxes that topical medications come in in order to help keep track of the instructions about where and how to use these. Pharmacies typically print the medication instructions only on the boxes and not  directly on the medication tubes.   If your medication is too expensive, please contact our office at [phone] option 4 or send us  a message through MyChart.   We are unable to tell what your co-pay for medications will be in advance as this is different depending on your insurance coverage. However, we may be able to find a substitute medication at lower cost or fill out paperwork to get insurance to cover a needed medication.   If a prior authorization is required to get your medication covered by your insurance company, please allow us  1-2 business days to complete this process.  Drug prices often vary depending on where the prescription is filled and some pharmacies may offer cheaper prices.  The website www.goodrx.com contains coupons for medications through different pharmacies. The prices here do not account for what the cost may be with help from insurance (it may be cheaper with your insurance), but the website can give you the price if you did not use any insurance.  - You can print the associated coupon and take it with your prescription to the pharmacy.  - You may also stop by our office during regular business hours and pick up a GoodRx coupon card.  - If you need your prescription sent electronically to a different pharmacy, notify our office through [facility] or by phone at [phone] option 4.     Si Usted Necesita Algo Despus de Su Visita  Tambin puede enviarnos un mensaje a travs de Clinical cytogeneticist. Por lo general respondemos a los mensajes de MyChart en el transcurso de 1 a 2 das hbiles.  Para renovar recetas, por favor pida a su farmacia que se ponga en contacto con nuestra oficina. Franz Jacks de fax es [place] [phone].  Si tiene un asunto urgente cuando la clnica est cerrada y que no puede esperar hasta el siguiente da hbil, puede llamar/localizar a su doctor(a) al nmero que aparece a continuacin.   Por favor, tenga en cuenta que aunque hacemos  todo lo posible para estar disponibles para asuntos urgentes fuera del horario de [place], no estamos disponibles las 24 horas del da, los 7 [address]  [address] de la [place].   Si tiene un problema urgente y no puede comunicarse con nosotros, puede optar por buscar atencin mdica  en el consultorio de su doctor(a), en una clnica privada, en un centro de atencin urgente o en una sala de emergencias.  Si tiene Engineer, drilling, por favor llame inmediatamente al 911 o vaya a la sala de emergencias.  Nmeros de bper  - Dr. Bary Likes: [phone]  - Dra. Annette Barters: [phone]  - Dr. Felipe Horton: [phone]   En caso de inclemencias del tiempo, por favor llame a Lajuan Pila principal al [phone] para una actualizacin sobre el [place] de cualquier retraso o cierre.  Consejos para la medicacin en dermatologa: Por favor, guarde las cajas en las que vienen los medicamentos de uso tpico para ayudarle  a seguir las [employer] dnde y cmo usarlos. Las farmacias generalmente imprimen las instrucciones del medicamento slo en las cajas y no directamente en los tubos del [place].   Si su medicamento es muy caro, por favor, pngase en contacto con Bettyjane Brunet llamando al [phone] y presione la opcin 4 o envenos un mensaje a travs de Clinical cytogeneticist.   No podemos decirle cul ser su copago por los medicamentos por adelantado ya que esto es diferente dependiendo de la cobertura de su seguro. Sin embargo, es posible que podamos encontrar un medicamento sustituto a Audiological scientist un formulario para que el seguro cubra el medicamento que se considera necesario.   Si se requiere una autorizacin previa para que su compaa de seguros [place] su medicamento, por favor permtanos de 1 a 2 das hbiles para completar este proceso.  Los precios de los medicamentos varan con frecuencia dependiendo del Environmental consultant de dnde se surte la receta y alguna farmacias pueden ofrecer precios ms baratos.  El  sitio web www.goodrx.com tiene cupones para medicamentos de Health and safety inspector. Los precios aqu no tienen en cuenta lo que podra costar con la ayuda del seguro (puede ser ms barato con su seguro), pero el sitio web puede darle el precio si no utiliz Tourist information centre manager.  - Puede imprimir el cupn correspondiente y llevarlo con su receta a la farmacia.  - Tambin puede pasar por nuestra oficina durante el horario de atencin regular y Education officer, museum una tarjeta de cupones de GoodRx.  - Si necesita que su receta se enve electrnicamente a una farmacia diferente, informe a nuestra oficina a travs de MyChart de [place] o por telfono llamando al [phone] y presione la opcin 4.

## 2023-12-19 NOTE — Telephone Encounter (Signed)
 per dr hisada order pt was given the information about ekg results and plan. pt understood.

## 2023-12-19 NOTE — Telephone Encounter (Signed)
-----   Message from [place] sent at 12/16/2023  9:09 AM EDT ----- Please inform the patient that the EKG shows a borderline QTc range. This finding does not require any immediate change, but we will continue to monitor it over time as a precaution. At this point, I recommend continuing the current medication as dis cussed until the next visit.

## 2023-12-20 ENCOUNTER — Other Ambulatory Visit (HOSPITAL_COMMUNITY)

## 2023-12-21 ENCOUNTER — Other Ambulatory Visit
Admission: RE | Admit: 2023-12-21 | Discharge: 2023-12-21 | Disposition: A | Discharge status: Home / Self Care | Source: Ambulatory Visit | Attending: Orthopedic Surgery | Admitting: Orthopedic Surgery

## 2023-12-21 ENCOUNTER — Ambulatory Visit
Admission: RE | Admit: 2023-12-21 | Discharge: 2023-12-21 | Disposition: A | Discharge status: Home / Self Care | Source: Ambulatory Visit | Attending: Orthopedic Surgery | Admitting: Orthopedic Surgery

## 2023-12-21 ENCOUNTER — Other Ambulatory Visit: Payer: Self-pay

## 2023-12-21 ENCOUNTER — Encounter
Admission: RE | Admit: 2023-12-21 | Discharge: 2023-12-21 | Disposition: A | Discharge status: Home / Self Care | Source: Ambulatory Visit | Attending: Orthopedic Surgery | Admitting: Orthopedic Surgery

## 2023-12-21 DIAGNOSIS — Z96652 Presence of left artificial knee joint: Secondary | ICD-10-CM | POA: Insufficient documentation

## 2023-12-21 LAB — CBC WITH DIFFERENTIAL/PLATELET
Abs Immature Granulocytes: 0.01 10*3/uL (ref 0.00–0.07)
Basophils Absolute: 0 10*3/uL (ref 0.0–0.1)
Basophils Relative: 0 %
Eosinophils Absolute: 0.1 10*3/uL (ref 0.0–0.5)
Eosinophils Relative: 2 %
HCT: 42.2 % (ref 36.0–46.0)
Hemoglobin: 14.1 g/dL (ref 12.0–15.0)
Immature Granulocytes: 0 %
Lymphocytes Relative: 32 %
Lymphs Abs: 2 10*3/uL (ref 0.7–4.0)
MCH: 31.5 pg (ref 26.0–34.0)
MCHC: 33.4 g/dL (ref 30.0–36.0)
MCV: 94.4 fL (ref 80.0–100.0)
Monocytes Absolute: 0.6 10*3/uL (ref 0.1–1.0)
Monocytes Relative: 9 %
Neutro Abs: 3.5 10*3/uL (ref 1.7–7.7)
Neutrophils Relative %: 57 %
Platelets: 217 10*3/uL (ref 150–400)
RBC: 4.47 MIL/uL (ref 3.87–5.11)
RDW: 12.3 % (ref 11.5–15.5)
WBC: 6.2 10*3/uL (ref 4.0–10.5)
nRBC: 0 % (ref 0.0–0.2)

## 2023-12-21 LAB — C-REACTIVE PROTEIN: CRP: <0.5 mg/dL (ref <1.0)

## 2023-12-21 MED ORDER — TECHNETIUM TC 99M MEDRONATE IV KIT
20.0000 | PACK | Freq: Once | INTRAVENOUS | Status: AC | PRN
  Administered 2023-12-21: 21.98 via INTRAVENOUS

## 2023-12-28 ENCOUNTER — Ambulatory Visit (INDEPENDENT_AMBULATORY_CARE_PROVIDER_SITE_OTHER): Admitting: Gastroenterology

## 2023-12-28 VITALS — BP 156/90 | HR 94 | Temp 98.2°F | Wt 178.0 lb

## 2023-12-28 DIAGNOSIS — K219 Gastro-esophageal reflux disease without esophagitis: Secondary | ICD-10-CM | POA: Diagnosis not present

## 2023-12-28 NOTE — Progress Notes (Signed)
 Karma Oz, MD [address]  [address]  [place], [place] [address]  Main: [phone]  Fax: [phone]    Gastroenterology Consultation  Referring Provider:     Macie Saxon, MD Primary Care Physician:  Macie Saxon, MD Primary Gastroenterologist:  Dr. Karma Oz Reason for Consultation: Reflux        HPI:   [name] [name] is a 68 y.o. female referred by Dr. Willene Harper, Georgeann Kindred, MD  for consultation & management of reflux symptoms.  She reports that for last 4 years, she has been experiencing burning in the chest, regurgitation of food, sour taste in mouth.  She is taking Nexium  40 mg daily with partial relief of symptoms.  She had an episode of aspiration event resulting in pneumonia, thought to be secondary to severe acid reflux.  She denies any difficulty swallowing.  She tried Tums, omeprazole in the past.  She denies any nocturnal symptoms.  She drinks 2 cups of coffee daily, likes eating chocolate.  Does not drink alcohol, does not smoke  Follow-up visit 12/28/2023 [name] [name] is here for follow-up of chronic GERD.  She did not undergo upper endoscopy and colonoscopy as recommended.  She underwent Cologuard test which came back negative.  She reports that her reflux symptoms are under control on Nexium  20 mg daily.  She does not have any concerns today.  She also cannot find a ride, that is why reluctant to undergo procedures.  NSAIDs: None  Antiplts/Anticoagulants/Anti thrombotics: None  GI Procedures: None  Past Medical History:  Diagnosis Date   Actinic keratosis    Acute hepatitis C without mention of hepatic coma(070.51)    Arthritis    fingers   Basal cell carcinoma 12/13/2022   left upper abdomen, [facility]   Carpal tunnel syndrome of left wrist    Depression    GERD (gastroesophageal reflux disease)    Hypothyroidism    Insomnia    Insomnia    Memory loss    Osteoporosis    SCC (squamous cell carcinoma) 03/06/2023   left lower lateral leg, clear  with bx on 06/20/23, Mohs cancelled   Squamous cell carcinoma of skin 12/27/2021   Left pretibial - [facility]   Tremor    Wears dentures    full upper, partial lower    Past Surgical History:  Procedure Laterality Date   ANKLE SURGERY Right    BLADDER TACT     CATARACT EXTRACTION W/PHACO Left 10/13/2021   Procedure: CATARACT EXTRACTION PHACO AND INTRAOCULAR LENS PLACEMENT (IOC) LEFT;  Surgeon: Annell Kidney, MD;  Location: [facility];  Service: Ophthalmology;  Laterality: Left;  3.97 00:49.0   CATARACT EXTRACTION W/PHACO Right 10/27/2021   Procedure: CATARACT EXTRACTION PHACO AND INTRAOCULAR LENS PLACEMENT (IOC) RIGHT 4.50 00:58.9;  Surgeon: Annell Kidney, MD;  Location: [facility];  Service: Ophthalmology;  Laterality: Right;   HAND SURGERY Right    TONSILLECTOMY       Current Outpatient Medications:    amitriptyline (ELAVIL) 50 MG tablet, Take 150 mg by mouth at bedtime as needed for sleep. , Disp: , Rfl:    ARIPiprazole (ABILIFY) 10 MG tablet, Take 10 mg by mouth daily., Disp: , Rfl:    ARIPiprazole (ABILIFY) 5 MG tablet, Take 5 mg by mouth daily., Disp: , Rfl:    buPROPion (WELLBUTRIN SR) 150 MG 12 hr tablet, Take 150 mg by mouth 3 (three) times daily. , Disp: , Rfl:    busPIRone (BUSPAR)  10 MG tablet, Take 10 mg by mouth 3 (three) times daily., Disp: , Rfl:    BYETTA 5 MCG PEN 5 MCG/0.02ML SOPN injection, Inject 5 mcg into the skin 2 (two) times daily with a meal., Disp: , Rfl:    calcium carbonate (OS-CAL) 600 MG TABS tablet, Take 600 mg by mouth 2 (two) times daily with a meal., Disp: , Rfl:    celecoxib  (CELEBREX ) 200 MG capsule, Take 200 mg by mouth daily as needed., Disp: , Rfl:    donepezil (ARICEPT) 10 MG tablet, Take 10 mg by mouth at bedtime., Disp: , Rfl:    esomeprazole  (NEXIUM ) 40 MG capsule, Take 1 capsule (40 mg total) by mouth 2 (two) times daily before a meal., Disp: 60 capsule, Rfl: 0   fluconazole  (DIFLUCAN ) 200 MG tablet, Take 1 tablet  (200 mg total) by mouth daily., Disp: 30 tablet, Rfl: 0   HYDROcodone-acetaminophen  (NORCO/VICODIN) 5-325 MG tablet, Take 2 tablets by mouth 2 (two) times daily., Disp: , Rfl:    [employer] PEN NEEDLES 31G X 6 MM MISC, as directed., Disp: , Rfl:    levothyroxine (SYNTHROID) 112 MCG tablet, Take 112 mcg by mouth daily before breakfast., Disp: , Rfl:    memantine (NAMENDA) 5 MG tablet, Take 5 mg by mouth 2 (two) times daily., Disp: , Rfl:    methocarbamol (ROBAXIN) 500 MG tablet, Take 1 tablet by mouth at bedtime., Disp: , Rfl:    metroNIDAZOLE  (METROCREAM ) 0.75 % cream, Apply to cheeks and nose at bedtime. Use twice daily for flares., Disp: 45 g, Rfl: 11   Multiple Vitamin (MULTI-VITAMIN) tablet, Take 1 tablet by mouth daily., Disp: , Rfl:    mupirocin  ointment (BACTROBAN ) 2 %, Apply 1 Application topically daily. Qd to wound on left lower leg until resolved, Disp: 22 g, Rfl: 0   MYRBETRIQ 25 MG TB24 tablet, Take 25 mg by mouth daily., Disp: , Rfl:    nystatin  cream (MYCOSTATIN ), Apply to affected areas rash in skin folds, Disp: 30 g, Rfl: 5   ramelteon  (ROZEREM ) 8 MG tablet, Take 1 tablet (8 mg total) by mouth at bedtime as needed for sleep., Disp: 30 tablet, Rfl: 1   solifenacin (VESICARE) 10 MG tablet, Take 10 mg by mouth daily., Disp: , Rfl:    Venlafaxine HCl 150 MG TB24, Take 2 tablets by mouth daily., Disp: , Rfl:    Family History  Problem Relation Age of Onset   Dementia Mother    Breast cancer Sister 64   Bulemia Brother    Alcohol abuse Brother    Neuropathy Neg Hx    Tremor Neg Hx      Social History   Tobacco Use   Smoking status: Former    Current packs/day: 0.00    Average packs/day: 1 pack/day for 10.0 years (10.0 ttl pk-yrs)    Types: Cigarettes    Start date: 36    Quit date: 32    Years since quitting: 42.3   Smokeless tobacco: Never  Vaping Use   Vaping status: Never Used  Substance Use Topics   Alcohol use: No   Drug use: No    Allergies as of  12/28/2023 - Review Complete 12/28/2023  Allergen Reaction Noted   Sonata [zaleplon]  07/25/2013   Carbidopa-levodopa Nausea Only 10/06/2021    Review of Systems:    All systems reviewed and negative except where noted in HPI.   Physical Exam:  BP (!) 156/90 (BP Location: Left Arm, Patient Position: Sitting,  Cuff Size: Normal)   Pulse 94   Temp 98.2 F (36.8 C) (Oral)   Wt 178 lb (80.7 kg)   BMI 29.62 kg/m  No LMP recorded. Patient is postmenopausal.  General:   Alert,  Well-developed, well-nourished, pleasant and cooperative in NAD Head:  Normocephalic and atraumatic. Eyes:  Sclera clear, no icterus.   Conjunctiva pink. Ears:  Normal auditory acuity. Nose:  No deformity, discharge, or lesions. Mouth:  No deformity or lesions,oropharynx pink & moist. Neck:  Supple; no masses or thyromegaly. Lungs:  Respirations even and unlabored.  Clear throughout to auscultation.   No wheezes, crackles, or rhonchi. No acute distress. Heart:  Regular rate and rhythm; no murmurs, clicks, rubs, or gallops. Abdomen:  Normal bowel sounds. Soft, non-tender and non-distended without masses, hepatosplenomegaly or hernias noted.  No guarding or rebound tenderness.   Rectal: Not performed Msk:  Symmetrical without gross deformities. Good, equal movement & strength bilaterally. Pulses:  Normal pulses noted. Extremities:  No clubbing or edema.  No cyanosis. Neurologic:  Alert and oriented x3;  grossly normal neurologically. Skin:  Intact without significant lesions or rashes. No jaundice. Psych:  Alert and cooperative. Normal mood and affect.  Imaging Studies: No abdominal imaging  Assessment and Plan:   [name] [name] is a 68 y.o. female with history of hypothyroidism is seen in consultation for follow-up of chronic GERD, currently controlled on low-dose Nexium  20 mg daily.  Patient does not have any other alarm symptoms.  She cannot undergo EGD because of not having a ride. Continue antireflux  lifestyle  Follow up as needed   Karma Oz, MD

## 2023-12-30 ENCOUNTER — Encounter: Payer: Self-pay | Admitting: Gastroenterology

## 2024-01-04 ENCOUNTER — Encounter: Payer: Self-pay | Admitting: Pulmonary Disease

## 2024-01-04 ENCOUNTER — Ambulatory Visit (INDEPENDENT_AMBULATORY_CARE_PROVIDER_SITE_OTHER): Admitting: Pulmonary Disease

## 2024-01-04 VITALS — BP 130/88 | HR 88 | Temp 97.6°F | Ht 65.0 in | Wt 174.0 lb

## 2024-01-04 DIAGNOSIS — Z87891 Personal history of nicotine dependence: Secondary | ICD-10-CM

## 2024-01-04 DIAGNOSIS — R053 Chronic cough: Secondary | ICD-10-CM | POA: Diagnosis not present

## 2024-01-04 DIAGNOSIS — R0609 Other forms of dyspnea: Secondary | ICD-10-CM

## 2024-01-04 DIAGNOSIS — I4510 Unspecified right bundle-branch block: Secondary | ICD-10-CM

## 2024-01-04 DIAGNOSIS — K219 Gastro-esophageal reflux disease without esophagitis: Secondary | ICD-10-CM | POA: Diagnosis not present

## 2024-01-04 NOTE — Progress Notes (Signed)
 Subjective:    Patient ID: [name] [name], female    DOB: 29-Nov-1955, 68 y.o.   MRN: [id number]  Patient Care Team: Macie Saxon, MD as PCP - General (Family Medicine) Marc Senior, MD as Consulting Physician (Pulmonary Disease)  Chief Complaint  Patient presents with   Follow-up    Occasional cough, shortness of breath and wheezing.     BACKGROUND/INTERVAL:Patient is a 68 year old who follows up on the issue of cough. Cough mostly in the evenings and related to gastroesophageal reflux.  Patient was last seen on 29 September 2023.  HPI Discussed the use of AI scribe software for clinical note transcription with the patient, who gave verbal consent to proceed.  History of Present Illness   [name] [name] is a 68 year old female who presents with shortness of breath.  She experiences chronic shortness of breath, primarily triggered by activities such as walking up steps. She attributes her breathing difficulties to her weight and has not been using any inhalers. No worsening of breathing problems was noted after a COVID-19 infection a year ago.  She has a history of sleep problems and was previously prescribed 150 mg of amitriptyline at bedtime, which was effective. However, her current doctor is tapering this medication, replacing it with an 8 mg tablet, resulting in her waking up at 3 or 4 AM, making her sleep feel more like a nap. She has not been diagnosed with sleep apnea.  She requires a knee replacement due to a 'bone on bone' condition, which limits her mobility and ability to walk, further impacting her weight management efforts.  She recalls having an EKG in the past but does not remember when her heart was last checked. A right bundle branch block was noted on a previous EKG.      She had PFTs performed on 27 November 2023 showing an FEV1 of 1.89 L or 78% predicted, FVC of 2.19 L or 69% predicted, FEV1/FVC of 86%, no bronchodilator response.  Lung volumes moderately  reduced, ERV is severely reduced consistent with obesity.  Minimal diffusion capacity impairment which corrects to normal by alveolar volume.  No obstructive defect.  HPI Review of Systems A 10 point review of systems was performed and it is as noted above otherwise negative.   Patient Active Problem List   Diagnosis Date Noted   Hepatitis C 08/19/2016   Depression 08/19/2016   Insomnia 08/19/2016   Hypothyroidism 08/19/2016    Social History   Tobacco Use   Smoking status: Former    Current packs/day: 0.00    Average packs/day: 1 pack/day for 10.0 years (10.0 ttl pk-yrs)    Types: Cigarettes    Start date: 37    Quit date: 1983    Years since quitting: 42.4   Smokeless tobacco: Never  Substance Use Topics   Alcohol use: No    Allergies  Allergen Reactions   Sonata [Zaleplon]     Took with another medication with this and it caused hallucinations.   Carbidopa-Levodopa Nausea Only    Current Meds  Medication Sig   amitriptyline (ELAVIL) 50 MG tablet Take 150 mg by mouth at bedtime as needed for sleep.    ARIPiprazole (ABILIFY) 10 MG tablet Take 10 mg by mouth daily.   ARIPiprazole (ABILIFY) 5 MG tablet Take 5 mg by mouth daily.   buPROPion (WELLBUTRIN SR) 150 MG 12 hr tablet Take 150 mg by mouth 3 (three) times daily.    busPIRone (BUSPAR) 10  MG tablet Take 10 mg by mouth 3 (three) times daily.   BYETTA 5 MCG PEN 5 MCG/0.02ML SOPN injection Inject 5 mcg into the skin 2 (two) times daily with a meal.   calcium carbonate (OS-CAL) 600 MG TABS tablet Take 600 mg by mouth 2 (two) times daily with a meal.   celecoxib  (CELEBREX ) 200 MG capsule Take 200 mg by mouth daily as needed.   donepezil (ARICEPT) 10 MG tablet Take 10 mg by mouth at bedtime.   esomeprazole  (NEXIUM ) 20 MG capsule Take 20 mg by mouth daily at 12 noon.   fluconazole  (DIFLUCAN ) 200 MG tablet Take 1 tablet (200 mg total) by mouth daily.   HYDROcodone-acetaminophen  (NORCO/VICODIN) 5-325 MG tablet Take 2  tablets by mouth 2 (two) times daily.   [employer] PEN NEEDLES 31G X 6 MM MISC as directed.   levothyroxine (SYNTHROID) 112 MCG tablet Take 112 mcg by mouth daily before breakfast.   memantine (NAMENDA) 5 MG tablet Take 10 mg by mouth 2 (two) times daily.   methocarbamol (ROBAXIN) 500 MG tablet Take 1 tablet by mouth at bedtime.   metroNIDAZOLE  (METROCREAM ) 0.75 % cream Apply to cheeks and nose at bedtime. Use twice daily for flares.   Multiple Vitamin (MULTI-VITAMIN) tablet Take 1 tablet by mouth daily.   mupirocin  ointment (BACTROBAN ) 2 % Apply 1 Application topically daily. Qd to wound on left lower leg until resolved   MYRBETRIQ 25 MG TB24 tablet Take 25 mg by mouth daily.   nystatin  cream (MYCOSTATIN ) Apply to affected areas rash in skin folds   solifenacin (VESICARE) 10 MG tablet Take 10 mg by mouth daily.   Venlafaxine HCl 150 MG TB24 Take 2 tablets by mouth daily.   [DISCONTINUED] ramelteon  (ROZEREM ) 8 MG tablet Take 1 tablet (8 mg total) by mouth at bedtime as needed for sleep.    Immunization History  Administered Date(s) Administered   Fluad Trivalent(High Dose 65+) 05/16/2023   Influenza-Unspecified 05/15/2022   Tdap 07/02/2021   Zoster Recombinant(Shingrix) 07/02/2021        Objective:     BP 130/88 (BP Location: Left Arm, Patient Position: Sitting, Cuff Size: Normal)   Pulse 88   Temp 97.6 F (36.4 C) (Temporal)   Ht 5\' 5"  (1.651 m)   Wt 174 lb (78.9 kg)   SpO2 97%   BMI 28.96 kg/m   SpO2: 97 %  GENERAL: Well-developed, overweight woman, no acute distress.  Fully ambulatory.  No conversational dyspnea. HEAD: Normocephalic, atraumatic.  EYES: Pupils equal, round, reactive to light.  No scleral icterus.  MOUTH: Poor dentition, mouth malocclusion. NECK: Supple. No thyromegaly. Trachea midline. No JVD.  No adenopathy. PULMONARY: Good air entry bilaterally.  Coarse, otherwise no adventitious sounds. CARDIOVASCULAR: S1 and S2. Regular rate and rhythm.  No rubs,  murmurs or gallops heard. ABDOMEN: Benign. MUSCULOSKELETAL: No joint deformity, no clubbing, no edema.  NEUROLOGIC: No overt focal deficit, no gait disturbance, speech is fluent. SKIN: Intact,warm,dry.  Multiple actinic keratoses. PSYCH: Flat affect.      Assessment & Plan:     ICD-10-CM   1. Dyspnea on exertion  R06.09 ECHOCARDIOGRAM COMPLETE    2. Right bundle branch block  I45.10     3. Gastroesophageal reflux disease, unspecified whether esophagitis present  K21.9     4. Chronic cough  R05.3       Orders Placed This Encounter  Procedures   ECHOCARDIOGRAM COMPLETE    Standing Status:   Future    Expected Date:  02/04/2024    Expiration Date:   01/03/2025    Where should this test be performed:   [facility]    Please indicate who you request to read the nuc med / echo results.:   [facility]    Perflutren DEFINITY (image enhancing agent) should be administered unless hypersensitivity or allergy exist:   Administer Perflutren    Reason for exam-Echo:   Dyspnea  R06.00   Discussion:    Shortness of breath Chronic shortness of breath likely related to obesity and knee osteoarthritis limiting mobility. No significant change in symptoms. No evidence of sleep apnea. Pulmonary function test shows restriction likely due to obesity.  Minimal diffusion capacity defect.  Normal chest X-rays. No indication for high-resolution CT. Symptoms exacerbated by stair climbing. - Consider pulmonary rehabilitation to improve conditioning.  Minimal diffusion defect Minimal diffusion defect on pulmonary function test, indicating mild impairment in oxygen transport. Corrected values are normal, suggesting minimal clinical significance and likely associated to obesity.  Knee osteoarthritis Severe knee osteoarthritis with bone-on-bone contact, limiting mobility and contributing to shortness of breath. Knee replacement surgery is planned but pending due to lack of post-operative care  support.  Right bundle branch block Right bundle branch block on EKG, indicating potential cardiac conduction issues. Further evaluation needed for structural heart abnormalities. - Order echocardiogram to assess cardiac structure.       Advised if symptoms do not improve or worsen, to please contact office for sooner follow up or seek emergency care.    I spent 32 minutes of dedicated to the care of this patient on the date of this encounter to include pre-visit review of records, face-to-face time with the patient discussing conditions above, post visit ordering of testing, clinical documentation with the electronic health record, making appropriate referrals as documented, and communicating necessary findings to members of the patients care team.     C. Chloe Counter, MD Advanced Bronchoscopy P[facility]    *This note was generated using voice recognition software/Dragon and/or AI transcription program.  Despite best efforts to proofread, errors can occur which can change the meaning. Any transcriptional errors that result from this process are unintentional and may not be fully corrected at the time of dictation.

## 2024-01-04 NOTE — Patient Instructions (Signed)
 VISIT SUMMARY:  Today, we discussed your chronic shortness of breath, sleep issues, knee osteoarthritis, and a previous finding of right bundle branch block on your EKG. We reviewed your symptoms and planned further evaluations and treatments to help manage your conditions.  YOUR PLAN:  -SHORTNESS OF BREATH: Your chronic shortness of breath is likely related to your weight and knee osteoarthritis, which limits your mobility. Your chest X-rays are normal, and there is no need for a high-resolution CT scan at this time. We recommend considering pulmonary rehabilitation to improve your conditioning and help manage your symptoms.  -MINIMAL DIFFUSION DEFECT: A minimal diffusion defect was found on your pulmonary function test, which means there is a mild impairment in oxygen transport. However, the corrected values are normal, so this finding has minimal clinical significance.  -KNEE OSTEOARTHRITIS: You have severe knee osteoarthritis with bone-on-bone contact, which limits your mobility and contributes to your shortness of breath. A knee replacement surgery is planned, but it is currently pending due to the lack of post-operative care support.  -RIGHT BUNDLE BRANCH BLOCK: A right bundle branch block was noted on your EKG, which indicates a potential issue with the electrical conduction in your heart. We need to further evaluate this with an echocardiogram to check for any structural abnormalities in your heart.  INSTRUCTIONS:  Please schedule an echocardiogram to assess your heart's structure. Consider enrolling in a pulmonary rehabilitation program to help improve your conditioning. Follow up with your orthopedic surgeon regarding the knee replacement surgery and ensure you have post-operative care support in place.  Will see you in follow-up in 3 months time call sooner should any new problems arise.

## 2024-01-12 ENCOUNTER — Ambulatory Visit: Payer: Self-pay | Admitting: Pulmonary Disease
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
## 2024-01-18 ENCOUNTER — Encounter: Payer: Self-pay | Admitting: Psychiatry

## 2024-01-18 ENCOUNTER — Ambulatory Visit: Admitting: Psychiatry

## 2024-01-18 VITALS — BP 118/84 | HR 98 | Ht 65.0 in | Wt 174.8 lb

## 2024-01-18 DIAGNOSIS — G3184 Mild cognitive impairment, so stated: Secondary | ICD-10-CM

## 2024-01-18 DIAGNOSIS — F331 Major depressive disorder, recurrent, moderate: Secondary | ICD-10-CM

## 2024-01-18 DIAGNOSIS — G4700 Insomnia, unspecified: Secondary | ICD-10-CM

## 2024-01-18 MED ORDER — PRAZOSIN HCL 1 MG PO CAPS: 1.0000 mg | ORAL_CAPSULE | Freq: Every day | ORAL | 1 refills | Status: DC

## 2024-01-18 NOTE — Patient Instructions (Signed)
 Continue venlafaxine 300 mg daily Continue bupropion 300 mg in AM, 150 mg in PM ( Continue Abilify 10 mg daily Next appointment- 7/21 at 4 PM

## 2024-01-19 ENCOUNTER — Telehealth: Payer: Self-pay

## 2024-01-19 NOTE — Telephone Encounter (Signed)
 pt was called she states that was the only one and that she will check with the pharmacy

## 2024-01-19 NOTE — Telephone Encounter (Signed)
 pt left message that none of her medications have been sent to the pharmacy. pt was last seen on 6-5 next appt 7-21

## 2024-01-19 NOTE — Telephone Encounter (Signed)
 Prazosin was ordered to the pharmacy yesterday. Could you ask her if she needs any other medications at this time?

## 2024-01-20 ENCOUNTER — Encounter: Payer: Self-pay | Admitting: Pulmonary Disease

## 2024-01-20 ENCOUNTER — Telehealth: Payer: Self-pay | Admitting: Psychiatry

## 2024-01-20 NOTE — Telephone Encounter (Signed)
 Spoke with the patient. She reports that prazosin  has been helpful for initial insomnia, though she continues to experience middle insomnia. She took amitriptyline last night to address this but denies any drowsiness or falls. She agrees to discontinue amitriptyline and will continue the current dose of prazosin  for now.

## 2024-01-22 ENCOUNTER — Telehealth: Payer: Self-pay

## 2024-01-22 NOTE — Telephone Encounter (Signed)
 Please clarify date picked up - did you mean June 16 ?

## 2024-01-22 NOTE — Telephone Encounter (Signed)
 pharmacy called with a responce to you wanting to know when pt got the bupropion and the buspirone from friday.  Bupropion the wellbutrin sr 150mg  take 1 in the am and 2 in the pm #90 was picked up on 6-16 has 2 refills left. The buspirone buspar 10mg  was take 3 times a day. #90 was also picked up on 6-16 and has 1 refill.

## 2024-01-23 NOTE — Telephone Encounter (Signed)
 left message to confirm date. (i sorry i think it was may and i put down june by mistake) but i left a message to just make sure)

## 2024-01-24 NOTE — Telephone Encounter (Signed)
 Noted, thank you

## 2024-01-24 NOTE — Telephone Encounter (Signed)
 pharmacy called it was filled on 6-6

## 2024-01-29 ENCOUNTER — Telehealth: Payer: Self-pay | Admitting: Psychiatry

## 2024-01-29 ENCOUNTER — Other Ambulatory Visit: Payer: Self-pay | Admitting: Psychiatry

## 2024-01-29 ENCOUNTER — Telehealth: Payer: Self-pay

## 2024-01-29 MED ORDER — DOXEPIN HCL 6 MG PO TABS: 6.0000 mg | ORAL_TABLET | Freq: Every evening | ORAL | 1 refills | Status: DC | PRN

## 2024-01-29 NOTE — Telephone Encounter (Signed)
 Talked with the patient. She has not slept for the past two weeks, and cannot even take a nap. She denies decreased need for sleep, euphoria, stating that she feels tired from this. No SI, hallucinations. She did find prazosin  to be helpful for anxiety, and denies any dizziness or fall.  She agrees with the plans as outlined.  Discussed potential risk of drowsiness, fall. She agrees to contact the office if any concern.  - continue prazosin  1 mg at night  - start doxepin 6 mg at night as needed for insomnia.

## 2024-01-29 NOTE — Telephone Encounter (Signed)
 pt left a message that neither sleeping medication is working for her and she had no sleep this weekend.

## 2024-01-31 ENCOUNTER — Other Ambulatory Visit: Payer: Self-pay | Admitting: Dermatology

## 2024-02-06 ENCOUNTER — Telehealth: Payer: Self-pay | Admitting: Psychiatry

## 2024-02-06 NOTE — Telephone Encounter (Signed)
 Called patient to make aware of the message from provider she voiced understanding

## 2024-02-06 NOTE — Telephone Encounter (Signed)
 Patient left message stating the sleep medication is not keeping her asleep. She keeps waking around 2 am. Please call to advise

## 2024-02-06 NOTE — Telephone Encounter (Signed)
 There are limited medication options available due to the risk of possible adverse reactions. I would also like to review her vitals before making any adjustments to her regimen. Please place her on the waitlist for an earlier visit and notify her accordingly.
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
## 2024-02-08 ENCOUNTER — Telehealth: Payer: Self-pay | Admitting: Psychiatry

## 2024-02-08 ENCOUNTER — Encounter: Payer: Self-pay | Admitting: Psychiatry

## 2024-02-08 ENCOUNTER — Ambulatory Visit: Admitting: Psychiatry

## 2024-02-08 VITALS — BP 110/75 | HR 93 | Temp 96.4°F | Ht 65.0 in | Wt 170.0 lb

## 2024-02-08 DIAGNOSIS — G4700 Insomnia, unspecified: Secondary | ICD-10-CM

## 2024-02-08 DIAGNOSIS — G3184 Mild cognitive impairment, so stated: Secondary | ICD-10-CM | POA: Diagnosis not present

## 2024-02-08 DIAGNOSIS — F331 Major depressive disorder, recurrent, moderate: Secondary | ICD-10-CM | POA: Diagnosis not present

## 2024-02-08 NOTE — Patient Instructions (Addendum)
 Continue venlafaxine 300 mg daily Continue bupropion SR 300 mg in AM, 150 mg in PM  Decrease abilify 5 mg daily for one week, then discontinue Discontinue doexpin Start prazosin  1 mg at night Referral to therapy- CBT-I Referral to TMS at [place] Next appointment- 7/21 at 4 PM

## 2024-02-08 NOTE — Telephone Encounter (Signed)
 PT called 6/26 stating that she was not going to follow up with any of her appointments due to disagreeing with her plan with sleep meds. She cancelled her 03/04/24 appt.

## 2024-03-01 ENCOUNTER — Ambulatory Visit
Admission: RE | Admit: 2024-03-01 | Discharge: 2024-03-01 | Disposition: A | Discharge status: Home / Self Care | Source: Ambulatory Visit | Attending: Pulmonary Disease | Admitting: Pulmonary Disease

## 2024-03-01 DIAGNOSIS — E039 Hypothyroidism, unspecified: Secondary | ICD-10-CM | POA: Diagnosis not present

## 2024-03-01 DIAGNOSIS — R0609 Other forms of dyspnea: Secondary | ICD-10-CM | POA: Insufficient documentation

## 2024-03-01 DIAGNOSIS — I519 Heart disease, unspecified: Secondary | ICD-10-CM | POA: Insufficient documentation

## 2024-03-01 LAB — ECHOCARDIOGRAM COMPLETE
AR max vel: 2.31 cm2
AV Area VTI: 2.91 cm2
AV Area mean vel: 2.14 cm2
AV Mean grad: 4 mmHg
AV Peak grad: 8.1 mmHg
Ao pk vel: 1.43 m/s
Area-P 1/2: 5.09 cm2
MV VTI: 3.23 cm2
S' Lateral: 2.5 cm

## 2024-03-01 NOTE — Progress Notes (Signed)
*  PRELIMINARY RESULTS* Echocardiogram 2D Echocardiogram has been performed.  [name] [name] 03/01/2024, 10:48 AM

## 2024-03-04 ENCOUNTER — Ambulatory Visit: Admitting: Psychiatry

## 2024-03-04 ENCOUNTER — Ambulatory Visit: Payer: Self-pay | Admitting: Pulmonary Disease

## 2024-03-24 ENCOUNTER — Emergency Department

## 2024-03-24 ENCOUNTER — Other Ambulatory Visit: Payer: Self-pay

## 2024-03-24 ENCOUNTER — Emergency Department
Admission: EM | Admit: 2024-03-24 | Discharge: 2024-03-24 | Disposition: A | Discharge status: Home / Self Care | Attending: Emergency Medicine | Admitting: Emergency Medicine

## 2024-03-24 ENCOUNTER — Encounter: Payer: Self-pay | Admitting: Emergency Medicine

## 2024-03-24 DIAGNOSIS — W19XXXA Unspecified fall, initial encounter: Secondary | ICD-10-CM

## 2024-03-24 DIAGNOSIS — S0101XA Laceration without foreign body of scalp, initial encounter: Secondary | ICD-10-CM | POA: Diagnosis not present

## 2024-03-24 DIAGNOSIS — S0990XA Unspecified injury of head, initial encounter: Secondary | ICD-10-CM | POA: Diagnosis present

## 2024-03-24 DIAGNOSIS — W01198A Fall on same level from slipping, tripping and stumbling with subsequent striking against other object, initial encounter: Secondary | ICD-10-CM | POA: Diagnosis not present

## 2024-03-24 MED ORDER — LIDOCAINE HCL (PF) 1 % IJ SOLN
5.0000 mL | Freq: Once | INTRAMUSCULAR | Status: AC
  Administered 2024-03-24: 5 mL
  Filled 2024-03-24: qty 5

## 2024-03-24 MED ORDER — ACETAMINOPHEN 500 MG PO TABS
1000.0000 mg | ORAL_TABLET | Freq: Once | ORAL | Status: AC
  Administered 2024-03-24: 1000 mg via ORAL
  Filled 2024-03-24: qty 2

## 2024-03-24 MED ORDER — METOCLOPRAMIDE HCL 10 MG PO TABS
10.0000 mg | ORAL_TABLET | Freq: Once | ORAL | Status: AC
  Administered 2024-03-24: 10 mg via ORAL
  Filled 2024-03-24: qty 1

## 2024-03-24 NOTE — ED Triage Notes (Signed)
 Pt BIB EMS from home after losing her balance and hitting the right side of her head on the cabinet doors. No LOC, no thinners, wrapped by ems.

## 2024-03-24 NOTE — ED Triage Notes (Signed)
 First Nurse:  Trip and fall from home via EMS. Small laceration and hematoma. Wrapped by EMS. No LOC, no thinners.

## 2024-03-24 NOTE — ED Notes (Signed)
 Patient's head irrigated, notified Tan, MD.

## 2024-03-24 NOTE — ED Notes (Signed)
 Waymond, MD at bedside to evaluate patient.

## 2024-03-24 NOTE — Discharge Instructions (Addendum)
 Please keep the laceration site clean and dry for 2 days.  Please but not soak your head in any body of water until the staples have been removed.  Please make sure to follow-up with primary care doctor to remove the staples in 5 to 7 days.  If you note any redness, purulent drainage, worsening pain to the site or fever please return to emergency department to be reassessed.  You can take 650 mg of Tylenol  every 6 hours as needed for pain.

## 2024-03-24 NOTE — ED Provider Notes (Signed)
 [facility] Provider Note    Event Date/Time   First MD Initiated Contact with Patient 03/24/24 2102     (approximate)   History   Fall   HPI  [name] [name] is a 68 y.o. female GERD, depression, presenting with headache after fall.  Patient states that she tripped, hit her head against a shelf.  No LOC.  She denies any vision changes, no focal weakness or numbness.  Not on any blood thinners.  States that she was able to ambulate after the fall.  Patient states that she got her Tdap less than 5 years ago.  Per independent history from EMS, she has a small scalp laceration and hematoma on the right, not on any blood thinners, EMS wrapped her head.      Physical Exam   Triage Vital Signs: ED Triage Vitals [03/24/24 2016]  Encounter Vitals Group     BP (!) 111/99     Girls Systolic BP Percentile      Girls Diastolic BP Percentile      Boys Systolic BP Percentile      Boys Diastolic BP Percentile      Pulse Rate 90     Resp 18     Temp 98.8 F (37.1 C)     Temp src      SpO2 98 %     Weight 171 lb (77.6 kg)     Height 5' 5 (1.651 m)     Head Circumference      Peak Flow      Pain Score 10     Pain Loc      Pain Education      Exclude from Growth Chart     Most recent vital signs: Vitals:   03/24/24 2016 03/24/24 2232  BP: (!) 111/99 134/85  Pulse: 90 99  Resp: 18 18  Temp: 98.8 F (37.1 C)   SpO2: 98% 96%     General: Awake, no distress.  CV:  Good peripheral perfusion.  Resp:  Normal effort.  No thoracic cage tenderness Abd:  No distention.  Soft nontender Other:  She has a right parietal scalp hematoma, no active bleeding at this time, no other palpable facial deformities or tenderness, no minutes of tenderness, no tenderness to all 4 extremities, full range of motion is intact, equal radial pulses bilaterally, no focal weakness or numbness.  Pupils are equal and reactive, extraocular movements are intact, TMs are clear  bilaterally   ED Results / Procedures / Treatments   Labs (all labs ordered are listed, but only abnormal results are displayed) Labs Reviewed - No data to display     RADIOLOGY On my independent interpretation, CT head without obvious intracranial hemorrhage   PROCEDURES:  Critical Care performed: No  .Laceration Repair  Date/Time: 03/24/2024 11:15 PM  Performed by: Waymond Lorelle Cummins, MD Authorized by: Waymond Lorelle Cummins, MD   Consent:    Consent obtained:  Verbal   Consent given by:  Patient   Risks discussed:  Infection and pain Universal protocol:    Procedure explained and questions answered to patient or proxy's satisfaction: yes     Imaging studies available: yes   Anesthesia:    Anesthesia method:  Local infiltration   Local anesthetic:  Lidocaine  1% w/o epi Laceration details:    Location:  Scalp   Scalp location:  R parietal   Length (cm):  2.5 Pre-procedure details:    Preparation:  Imaging obtained  to evaluate for foreign bodies and patient was prepped and draped in usual sterile fashion Exploration:    Hemostasis achieved with:  Direct pressure   Wound exploration: wound explored through full range of motion   Treatment:    Amount of cleaning:  Standard   Irrigation solution:  Sterile saline   Irrigation method:  Syringe Skin repair:    Repair method:  Staples   Number of staples:  6 Approximation:    Approximation:  Close Repair type:    Repair type:  Simple Post-procedure details:    Dressing:  Non-adherent dressing   Procedure completion:  Tolerated well, no immediate complications    MEDICATIONS ORDERED IN ED: Medications  acetaminophen  (TYLENOL ) tablet 1,000 mg (1,000 mg Oral Given 03/24/24 2022)  metoCLOPramide  (REGLAN ) tablet 10 mg (10 mg Oral Given 03/24/24 2156)  lidocaine  (PF) (XYLOCAINE ) 1 % injection 5 mL (5 mLs Other Given 03/24/24 2238)     IMPRESSION / MDM / ASSESSMENT AND PLAN / ED COURSE  I reviewed the triage vital signs and the  nursing notes.                              Differential diagnosis includes, but is not limited to, intracranial hemorrhage, fracture, hematoma, scalp laceration.  Fall appears to be mechanical.  Will give her some Tylenol  for headache.  Will have her wounds irrigated and reassess if laceration pair is needed.  Patient's presentation is most consistent with acute presentation with potential threat to life or bodily function.  Independent interpretation of imaging below.  Staples are in place, discussed with patient about post laceration care.  Instructed her to follow-up with her primary care doctor to get her staples removed in 5 to 7 days.  Considered but no indication for inpatient admission at this time, she safe for outpatient management.  Will discharge with strict return precautions.    Clinical Course as of 03/24/24 2316  Sun Mar 24, 2024  2122 CT Head Wo Contrast No acute intracranial abnormality.  [TT]  2149 CT Cervical Spine Wo Contrast Multilevel degenerative change without acute abnormality.  [TT]    Clinical Course User Index [TT] Waymond Lorelle Cummins, MD     FINAL CLINICAL IMPRESSION(S) / ED DIAGNOSES   Final diagnoses:  Fall, initial encounter  Laceration of scalp, initial encounter     Rx / DC Orders   ED Discharge Orders     None        Note:  This document was prepared using Dragon voice recognition software and may include unintentional dictation errors.    Waymond Lorelle Cummins, MD 03/24/24 [phone]

## 2024-03-24 NOTE — ED Notes (Signed)
 Patient given discharge instructions including wound care with stated understanding. Patient stable and ambulatory to lobby to await ride.

## 2024-03-27 ENCOUNTER — Ambulatory Visit: Admitting: Dermatology

## 2024-04-03 ENCOUNTER — Ambulatory Visit: Admitting: Dermatology

## 2024-04-03 DIAGNOSIS — L565 Disseminated superficial actinic porokeratosis (DSAP): Secondary | ICD-10-CM | POA: Diagnosis not present

## 2024-04-03 DIAGNOSIS — B351 Tinea unguium: Secondary | ICD-10-CM | POA: Diagnosis not present

## 2024-04-03 DIAGNOSIS — L820 Inflamed seborrheic keratosis: Secondary | ICD-10-CM

## 2024-04-03 DIAGNOSIS — D489 Neoplasm of uncertain behavior, unspecified: Secondary | ICD-10-CM | POA: Diagnosis not present

## 2024-04-03 DIAGNOSIS — C44729 Squamous cell carcinoma of skin of left lower limb, including hip: Secondary | ICD-10-CM

## 2024-04-03 NOTE — Progress Notes (Signed)
 Follow-Up Visit   Subjective  PEITYN PAYTON is a 68 y.o. female who presents for the following: ONYCHOMYCOSIS patient states that she is taking Fluconazole  as prescribed at her last appointment. She states that she feels the medication is not working. She also has scaly spots on her legs to check, sore/irritated. H/o SCC/BCC   The following portions of the chart were reviewed this encounter and updated as appropriate: medications, allergies, medical history  Review of Systems:  No other skin or systemic complaints except as noted in HPI or Assessment and Plan.  Objective  Well appearing patient in no apparent distress; mood and affect are within normal limits.   A focused examination was performed of the following areas: Toenails   Relevant exam findings are noted in the Assessment and Plan.  L lower leg x1, L anterior heel x1, L posterior ankle x1 (3) Pink tan scaly papules L upper knee 10x67mm pink scaly papule     Assessment & Plan   ONYCHOMYCOSIS Exam: Dystrophic thickened toenails with subungal debris Right great and 3rd toenails   Chronic and persistent condition with duration or expected duration over one year. Condition is symptomatic/ bothersome to patient. Not currently at goal.   Treatment Plan: Discussed sending a test to determine ID of fungus and best treatment options.  Will send Fungal ID today.  Continue Fluconazole  200 mg PO qwk as prescribed Side effects of fluconazole  (diflucan ) include nausea, diarrhea, headache, dizziness, taste changes, rare risk of irritation of the liver, allergy, or decreased blood counts (which could show up as infection or tiredness).    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 Condition is symptomatic/ bothersome to patient. Not currently at goal.    DSAP is a chronic inherited condition of sun-exposed skin, most commonly affecting the arms and legs.  It is difficult to treat.  Recommend photoprotection and regular use of spf 30 or higher sunscreen to prevent worsening of condition and precancerous changes.  Treatment Plan: Recommend daily broad spectrum sunscreen SPF 30+ to arms/legs, reapply every 2 hours as needed and/or photoprotection with clothing. Call for new or changing lesions.    May consider SM cholesterol cream on f/up  INFLAMED SEBORRHEIC KERATOSIS (3) L lower leg x1, L anterior heel x1, L posterior ankle x1 (3) Symptomatic, irritating, patient would like treated.  L posterior ankle vs irritated porokeratosis  Destruction of lesion - L lower leg x1, L anterior heel x1, L posterior ankle x1 (3)  Destruction method: cryotherapy   Informed consent: discussed and consent obtained   Lesion destroyed using liquid nitrogen: Yes   Region frozen until ice ball extended beyond lesion: Yes   Outcome: patient tolerated procedure well with no complications   Post-procedure details: wound care instructions given   Additional details:  Prior to procedure, discussed risks of blister formation, small wound, skin dyspigmentation, or rare scar following cryotherapy. Recommend Vaseline ointment to treated areas while healing.   NEOPLASM OF UNCERTAIN BEHAVIOR L upper knee Skin / nail biopsy Type of biopsy: tangential   Informed consent: discussed and consent obtained   Patient was prepped and draped in usual sterile fashion: Area prepped with alcohol. Anesthesia: the lesion was anesthetized in a standard fashion   Anesthetic:  1% lidocaine  w/ epinephrine  1-100,000 buffered w/ 8.4% NaHCO3 Instrument used: flexible razor blade   Hemostasis achieved  with: pressure, aluminum chloride and electrodesiccation   Outcome: patient tolerated procedure well    Destruction of lesion  Destruction method: electrodesiccation and curettage    Informed consent: discussed and consent obtained   Curettage performed in three different directions: Yes   Electrodesiccation performed over the curetted area: Yes   Final wound size (cm):  1.4 Hemostasis achieved with:  pressure, aluminum chloride and electrodesiccation Outcome: patient tolerated procedure well with no complications   Post-procedure details: sterile dressing applied and wound care instructions given   Dressing type: petrolatum and bandage    Specimen 1 - Surgical pathology Differential Diagnosis: r/o SCC  EDC completed  Check Margins: No  Return for Appointment as scheduled.  I, Emerick Ege, CMA am acting as scribe for Rexene Rattler, MD.    Documentation: I have reviewed the above documentation for accuracy and completeness, and I agree with the above.  Rexene Rattler, MD

## 2024-04-03 NOTE — Patient Instructions (Addendum)

## 2024-04-04 ENCOUNTER — Other Ambulatory Visit: Payer: Self-pay | Admitting: Psychiatry

## 2024-04-04 LAB — SURGICAL PATHOLOGY

## 2024-04-08 ENCOUNTER — Ambulatory Visit: Payer: Self-pay | Admitting: Dermatology

## 2024-04-08 ENCOUNTER — Encounter: Payer: Self-pay | Admitting: Dermatology

## 2024-04-08 ENCOUNTER — Telehealth: Payer: Self-pay

## 2024-04-08 NOTE — Telephone Encounter (Signed)
 [employer] nail fungal ID results in media. aw

## 2024-04-08 NOTE — Telephone Encounter (Signed)
 Patient advised of BX results. aw

## 2024-04-08 NOTE — Telephone Encounter (Signed)
-----   Message from Rexene Rattler sent at 04/08/2024 10:04 AM EDT ----- 1. Skin, L upper knee :       WELL DIFFERENTIATED SQUAMOUS CELL CARCINOMA   SCC skin cancer- already treated with EDC at time of biopsy  - please call patient ----- Message ----- From: Interface, Lab In Three Zero One Sent: 04/04/2024   3:52 PM EDT To: Rexene Rattler, MD

## 2024-04-08 NOTE — Telephone Encounter (Signed)
 Patient has been advised of information per Dr. Jackquline. She has rescheduled in September for new nail clipping. aw

## 2024-04-08 NOTE — Telephone Encounter (Signed)
 Left pt msg to call for bx results/sh

## 2024-04-09 ENCOUNTER — Ambulatory Visit: Admitting: Pulmonary Disease

## 2024-04-09 ENCOUNTER — Encounter: Payer: Self-pay | Admitting: Pulmonary Disease

## 2024-04-09 VITALS — BP 128/80 | HR 89 | Temp 98.3°F | Ht 65.0 in | Wt 168.8 lb

## 2024-04-09 DIAGNOSIS — I10 Essential (primary) hypertension: Secondary | ICD-10-CM

## 2024-04-09 DIAGNOSIS — I5189 Other ill-defined heart diseases: Secondary | ICD-10-CM

## 2024-04-09 DIAGNOSIS — R0609 Other forms of dyspnea: Secondary | ICD-10-CM

## 2024-04-09 DIAGNOSIS — R059 Cough, unspecified: Secondary | ICD-10-CM

## 2024-04-09 DIAGNOSIS — Z87891 Personal history of nicotine dependence: Secondary | ICD-10-CM

## 2024-04-09 DIAGNOSIS — K219 Gastro-esophageal reflux disease without esophagitis: Secondary | ICD-10-CM | POA: Diagnosis not present

## 2024-04-09 NOTE — Progress Notes (Unsigned)
 Subjective:    Patient ID: [name] [name], female    DOB: 16-Oct-1955, 68 y.o.   MRN: [id number]  Patient Care Team: [place] HERO, MD as PCP - General (Family Medicine) Tamea Dedra CROME, MD as Consulting Physician (Pulmonary Disease)  Chief Complaint  Patient presents with   Shortness of Breath    Shortness of breath on exertion.     BACKGROUND/INTERVAL:Patient is a 68 year old who follows up on the issue of cough. Cough mostly in the evenings and related to gastroesophageal reflux.  Patient was last seen on 04 Jan 2024.   HPI Discussed the use of AI scribe software for clinical note transcription with the patient, who gave verbal consent to proceed.  History of Present Illness   [name] [name] is a 68 year old female who presents for follow-up of cough related to gastroesophageal reflux symptoms.  She has experienced a significant decrease in her cough, which was previously associated with reflux.   She underwent echocardiogram on 18 July that showed normal LVEF with grade 1 diastolic dysfunction which is mild.  She inquired about diastolic dysfunction and we discussed the implications of the same and how to properly manage with maintaining weight, blood pressure and staying active.  She experiences shortness of breath only when walking longer distances and associates episodes of nervousness, described as panic attacks, with this shortness of breath.  These episodes of shortness of breath have improved dramatically.  She reports recent intentional weight loss.  She remains abstinent of cigarettes.  Overall she feels well and looks well.     DATA 11/27/2023 PFTs: FEV1 of 1.89 L or 78% predicted, FVC of 2.19 L or 69% predicted, FEV1/FVC of 86%, no bronchodilator response. Lung volumes moderately reduced, ERV is severely reduced consistent with obesity. Minimal diffusion capacity impairment which corrects to normal by alveolar volume. No obstructive defect.  03/01/2024  echocardiogram: LVEF 60 to 65%, normal LV function, no regional wall motion abnormalities, grade 1 DD.  Right ventricular systolic function is normal.  Right ventricular size is normal.  Impaired no evidence of aortic valve dysfunction.   Review of Systems A 10 point review of systems was performed and it is as noted above otherwise negative.   Patient Active Problem List   Diagnosis Date Noted   Hepatitis C 08/19/2016   Depression 08/19/2016   Insomnia 08/19/2016   Hypothyroidism 08/19/2016    Social History   Tobacco Use   Smoking status: Former    Current packs/day: 0.00    Average packs/day: 1 pack/day for 10.0 years (10.0 ttl pk-yrs)    Types: Cigarettes    Start date: 47    Quit date: 1983    Years since quitting: 42.7   Smokeless tobacco: Never  Substance Use Topics   Alcohol use: No    Allergies  Allergen Reactions   Sonata [Zaleplon]     Took with another medication with this and it caused hallucinations.   Carbidopa-Levodopa Nausea Only    Current Meds  Medication Sig   amitriptyline (ELAVIL) 50 MG tablet Take 150 mg by mouth at bedtime.   ARIPiprazole (ABILIFY) 10 MG tablet Take 10 mg by mouth daily.   ARIPiprazole (ABILIFY) 5 MG tablet Take 5 mg by mouth daily.   buPROPion (WELLBUTRIN SR) 150 MG 12 hr tablet Take 150 mg by mouth 3 (three) times daily.    busPIRone (BUSPAR) 10 MG tablet Take 10 mg by mouth 3 (three) times daily.   calcium carbonate (OS-CAL)  600 MG TABS tablet Take 600 mg by mouth 2 (two) times daily with a meal.   celecoxib  (CELEBREX ) 200 MG capsule Take 200 mg by mouth daily as needed.   donepezil (ARICEPT) 10 MG tablet Take 10 mg by mouth at bedtime.   esomeprazole  (NEXIUM ) 20 MG capsule Take 20 mg by mouth daily at 12 noon.   fluconazole  (DIFLUCAN ) 200 MG tablet Take 1 tablet (200 mg total) by mouth daily.   [employer] PEN NEEDLES 31G X 6 MM MISC as directed.   levothyroxine (SYNTHROID) 112 MCG tablet Take 112 mcg by mouth daily before  breakfast.   memantine (NAMENDA) 5 MG tablet Take 10 mg by mouth 2 (two) times daily.   methocarbamol (ROBAXIN) 500 MG tablet Take 1 tablet by mouth at bedtime.   metroNIDAZOLE  (METROCREAM ) 0.75 % cream Apply to cheeks and nose at bedtime. Use twice daily for flares.   Multiple Vitamin (MULTI-VITAMIN) tablet Take 1 tablet by mouth daily.   mupirocin  ointment (BACTROBAN ) 2 % Apply 1 Application topically daily. Qd to wound on left lower leg until resolved (Patient taking differently: Apply 1 Application topically as needed. Qd to wound on left lower leg until resolved)   MYRBETRIQ 25 MG TB24 tablet Take 25 mg by mouth daily.   nystatin  cream (MYCOSTATIN ) Apply to affected areas rash in skin folds   Venlafaxine HCl 150 MG TB24 Take 2 tablets by mouth daily.    Immunization History  Administered Date(s) Administered   Fluad Trivalent(High Dose 65+) 05/16/2023   Influenza-Unspecified 05/15/2022   Tdap 07/02/2021   Zoster Recombinant(Shingrix) 07/02/2021        Objective:     BP 128/80   Pulse 89   Temp 98.3 F (36.8 C) (Oral)   Ht 5' 5 (1.651 m)   Wt 168 lb 12.8 oz (76.6 kg)   SpO2 96%   BMI 28.09 kg/m   SpO2: 96 %  GENERAL: Well-developed, overweight woman, no acute distress.  Fully ambulatory.  No conversational dyspnea. HEAD: Normocephalic, atraumatic.  EYES: Pupils equal, round, reactive to light.  No scleral icterus.  MOUTH: Poor dentition, mouth malocclusion. NECK: Supple. No thyromegaly. Trachea midline. No JVD.  No adenopathy. PULMONARY: Good air entry bilaterally.  Coarse, otherwise no adventitious sounds. CARDIOVASCULAR: S1 and S2. Regular rate and rhythm.  No rubs, murmurs or gallops heard. ABDOMEN: Benign. MUSCULOSKELETAL: No joint deformity, no clubbing, no edema.  NEUROLOGIC: No overt focal deficit, no gait disturbance, speech is fluent. SKIN: Intact,warm,dry.  Multiple actinic keratoses. PSYCH: Flat affect.     Assessment & Plan:     ICD-10-CM   1.  Cough, unspecified type - RESOLVED  R05.9     2. Chronic GERD  K21.9     3. Grade I diastolic dysfunction  I51.89     4. Dyspnea on exertion -IMPROVED  R06.09      Discussion:    Gastroesophageal reflux disease (GERD) GERD symptoms are controlled, contributing to the resolution of the cough.  Diastolic dysfunction, grade 1 Mild diastolic dysfunction with grade 1 stiffness in the main chamber of the heart. This is the mildest form and is not expected to progress significantly unless there is uncontrolled hypertension. - Encourage staying active and maintaining blood pressure control. - Advise weight loss for management.  Hypertension Blood pressure is well controlled, important for preventing progression of diastolic dysfunction.  Panic attacks Experiencing panic attacks, potentially contributing to episodes of shortness of breath. The relationship between panic attacks and shortness of breath is  under consideration, but no definitive cause identified.   Will see the patient in follow-up in 6 months time she is to contact us  prior to that time should any new difficulties arise.     Advised if symptoms do not improve or worsen, to please contact office for sooner follow up or seek emergency care.    I spent 32 minutes of dedicated to the care of this patient on the date of this encounter to include pre-visit review of records, face-to-face time with the patient discussing conditions above, post visit ordering of testing, clinical documentation with the electronic health record, making appropriate referrals as documented, and communicating necessary findings to members of the patients care team.     C. Leita Sanders, MD Advanced Bronchoscopy PCCM [facility]    *This note was generated using voice recognition software/Dragon and/or AI transcription program.  Despite best efforts to proofread, errors can occur which can change the meaning. Any transcriptional  errors that result from this process are unintentional and may not be fully corrected at the time of dictation.

## 2024-04-09 NOTE — Patient Instructions (Addendum)
 VISIT SUMMARY:  Today, we discussed your heart stiffness, reflux symptoms, and recent weight loss. Your cough has significantly decreased, and your reflux symptoms are under control. We also talked about your heart condition, shortness of breath, and episodes of nervousness.  YOUR PLAN:  -GASTROESOPHAGEAL REFLUX DISEASE (GERD): GERD is a condition where stomach acid frequently flows back into the tube connecting your mouth and stomach. Your symptoms are well controlled, which has helped reduce your cough.  -DIASTOLIC DYSFUNCTION, GRADE 1: Diastolic dysfunction is when the heart's main chamber has difficulty relaxing and filling with blood. You have the mildest form, which is not expected to worsen significantly if your blood pressure remains controlled. Staying active and losing weight are recommended.  -PANIC ATTACKS: Panic attacks are sudden episodes of intense fear that may trigger severe physical reactions. These may be contributing to your shortness of breath, but no definitive cause has been identified.  INSTRUCTIONS:  Please continue to stay active, maintain your blood pressure, and work on weight loss. If you experience any new or worsening symptoms, please schedule a follow-up appointment.

## 2024-05-01 ENCOUNTER — Ambulatory Visit: Admitting: Dermatology

## 2024-05-01 DIAGNOSIS — B351 Tinea unguium: Secondary | ICD-10-CM

## 2024-05-01 NOTE — Progress Notes (Signed)
   Follow-Up Visit   Subjective  [name] [name] is a 68 y.o. female who presents for the following: Repeat toenail clippings for fungal ID testing. Last test showed staph aureus, but no fungus.   The following portions of the chart were reviewed this encounter and updated as appropriate: medications, allergies, medical history  Review of Systems:  No other skin or systemic complaints except as noted in HPI or Assessment and Plan.  Objective  Well appearing patient in no apparent distress; mood and affect are within normal limits.   A focused examination was performed of the following areas: Bilateral feet  Relevant exam findings are noted in the Assessment and Plan.            Assessment & Plan   ONYCHOMYCOSIS Exam: Thickened toenails with subungal debris c/w onychomycosis, nail clippings obtained today  Chronic and persistent condition with duration or expected duration over one year. Condition is bothersome/symptomatic for patient. Currently flared.   Treatment Plan: - Treatment pending results of repeat Nail ID fungal testing - Advised to use OTC 40% urea cream; apply to affected toenails twice daily and cover with Band-Aid to soften nail.    Return for Appointment as scheduled.  I, Emerick Ege, CMA am acting as scribe for Rexene Rattler, MD.   Documentation: I have reviewed the above documentation for accuracy and completeness, and I agree with the above.  Rexene Rattler, MD

## 2024-05-01 NOTE — Patient Instructions (Addendum)
 Look for a cream containing urea 40% to help smooth out the skin. Brands include Eucerin Roughness relief spot treatment (drugstores), PurSources (Amazon), and Urea40 ([employer]), Bare40 ([employer]), Eucerin roughness relief     Due to recent changes in healthcare laws, you may see results of your pathology and/or laboratory studies on MyChart before the doctors have had a chance to review them. We understand that in some cases there may be results that are confusing or concerning to you. Please understand that not all results are received at the same time and often the doctors may need to interpret multiple results in order to provide you with the best plan of care or course of treatment. Therefore, we ask that you please give us  2 business days to thoroughly review all your results before contacting the office for clarification. Should we see a critical lab result, you will be contacted sooner.   If You Need Anything After Your Visit  If you have any questions or concerns for your doctor, please call our main line at [phone] and press option 4 to reach your doctor's medical assistant. If no one answers, please leave a voicemail as directed and we will return your call as soon as possible. Messages left after 4 pm will be answered the following business day.   You may also send us  a message via MyChart. We typically respond to MyChart messages within 1-2 business days.  For prescription refills, please ask your pharmacy to contact our office. Our fax number is [phone].  If you have an urgent issue when the clinic is closed that cannot wait until the next business day, you can page your doctor at the number below.    Please note that while we do our best to be available for urgent issues outside of office hours, we are not available 24/7.   If you have an urgent issue and are unable to reach us , you may choose to seek medical care at your doctor's office, retail clinic, urgent care center, or  emergency room.  If you have a medical emergency, please immediately call 911 or go to the emergency department.  Pager Numbers  - Dr. Hester: [phone]  - Dr. Jackquline: [phone]  - Dr. Claudene: [phone]   - Dr. Raymund: [phone]  In the event of inclement weather, please call our main line at [phone] for an update on the status of any delays or closures.  Dermatology Medication Tips: Please keep the boxes that topical medications come in in order to help keep track of the instructions about where and how to use these. Pharmacies typically print the medication instructions only on the boxes and not directly on the medication tubes.   If your medication is too expensive, please contact our office at [phone] option 4 or send us  a message through MyChart.   We are unable to tell what your co-pay for medications will be in advance as this is different depending on your insurance coverage. However, we may be able to find a substitute medication at lower cost or fill out paperwork to get insurance to cover a needed medication.   If a prior authorization is required to get your medication covered by your insurance company, please allow us  1-2 business days to complete this process.  Drug prices often vary depending on where the prescription is filled and some pharmacies may offer cheaper prices.  The website www.goodrx.com contains coupons for medications through different pharmacies. The prices here do not account for what the  cost may be with help from insurance (it may be cheaper with your insurance), but the website can give you the price if you did not use any insurance.  - You can print the associated coupon and take it with your prescription to the pharmacy.  - You may also stop by our office during regular business hours and pick up a GoodRx coupon card.  - If you need your prescription sent electronically to a different pharmacy, notify our office through [facility] or by phone at [phone] option 4.     Si Usted Necesita Algo Despus de Su Visita  Tambin puede enviarnos un mensaje a travs de Clinical cytogeneticist. Por lo general respondemos a los mensajes de MyChart en el transcurso de 1 a 2 das hbiles.  Para renovar recetas, por favor pida a su farmacia que se ponga en contacto con nuestra oficina. Randi lakes de fax es [place] [phone].  Si tiene un asunto urgente cuando la clnica est cerrada y que no puede esperar hasta el siguiente da hbil, puede llamar/localizar a su doctor(a) al nmero que aparece a continuacin.   Por favor, tenga en cuenta que aunque hacemos todo lo posible para estar disponibles para asuntos urgentes fuera del horario de [place], no estamos disponibles las 24 horas del da, los 7 [address]  [address] de la [place].   Si tiene un problema urgente y no puede comunicarse con nosotros, puede optar por buscar atencin mdica  en el consultorio de su doctor(a), en una clnica privada, en un centro de atencin urgente o en una sala de emergencias.  Si tiene Engineer, drilling, por favor llame inmediatamente al 911 o vaya a la sala de emergencias.  Nmeros de bper  - Dr. Hester: [phone]  - Dra. Jackquline: [phone]  - Dr. Claudene: [phone]  - Dra. Kitts: [phone]  En caso de inclemencias del [place], por favor llame a nuestra lnea principal al [phone] para una actualizacin sobre el estado de cualquier retraso o cierre.  Consejos para la medicacin en dermatologa: Por favor, guarde las cajas en las que vienen los medicamentos de uso tpico para ayudarle a seguir las instrucciones sobre dnde y cmo usarlos. Las farmacias generalmente imprimen las instrucciones del medicamento slo en las cajas y no directamente en los tubos del [place].   Si su medicamento es muy caro, por favor, pngase en contacto con landry rieger llamando al [phone] y presione la opcin 4 o envenos un mensaje a travs de  Clinical cytogeneticist.   No podemos decirle cul ser su copago por los medicamentos por adelantado ya que esto es diferente dependiendo de la cobertura de su seguro. Sin embargo, es posible que podamos encontrar un medicamento sustituto a Audiological scientist un formulario para que el seguro cubra el medicamento que se considera necesario.   Si se requiere una autorizacin previa para que su compaa de seguros malta su medicamento, por favor permtanos de 1 a 2 das hbiles para completar este proceso.  Los precios de los medicamentos varan con frecuencia dependiendo del Environmental consultant de dnde se surte la receta y alguna farmacias pueden ofrecer precios ms baratos.  El sitio web www.goodrx.com tiene cupones para medicamentos de Health and safety inspector. Los precios aqu no tienen en cuenta lo que podra costar con la ayuda del seguro (puede ser ms barato con su seguro), pero el sitio web puede darle el precio si no utiliz Tourist information centre manager.  - Puede imprimir el cupn correspondiente y llevarlo con su receta a la farmacia.  -  Tambin puede pasar por nuestra oficina durante el horario de atencin regular y Education officer, museum una tarjeta de cupones de GoodRx.  - Si necesita que su receta se enve electrnicamente a una farmacia diferente, informe a nuestra oficina a travs de MyChart de [place] o por telfono llamando al [phone] y presione la opcin 4.

## 2024-05-06 ENCOUNTER — Telehealth: Payer: Self-pay

## 2024-05-06 NOTE — Telephone Encounter (Signed)
 [employer] Nail Fungal ID in media. aw

## 2024-05-07 MED ORDER — UREA 40 % EX LOTN: 1.0000 | TOPICAL_LOTION | Freq: Two times a day (BID) | CUTANEOUS | 2 refills | Status: DC

## 2024-05-07 NOTE — Telephone Encounter (Signed)
 Patient left voicemail requesting RX to pharmacy to see if she can get this covered by her insurance. aw

## 2024-05-07 NOTE — Telephone Encounter (Signed)
Patient advised of information per Dr. Stewart. aw 

## 2024-05-07 NOTE — Addendum Note (Signed)
 Addended by: TERESA PALMA R on: 05/07/2024 05:40 PM   Modules accepted: Orders

## 2024-05-18 NOTE — Discharge Instructions (Signed)
 Instructions after Total Knee Replacement   [name] [name], Jr., M.D.    Dept. of Orthopaedics & Sports Medicine [facility] [address] [place], [place]  [address]  Phone: [phone]   Fax: [phone]       [email or web address]       DIET: Drink plenty of non-alcoholic fluids. Resume your normal diet. Include foods high in fiber.  ACTIVITY:  You may use crutches or a walker with weight-bearing as tolerated, unless instructed otherwise. You may be weaned off of the walker or crutches by your Physical Therapist.  Do NOT place pillows under the knee. Anything placed under the knee could limit your ability to straighten the knee.   Use the Bone Foam 3 times a day for 30 minutes each session to help straighten the knee. Continue doing gentle exercises. Exercising will reduce the pain and swelling, increase motion, and prevent muscle weakness.   Please continue to use the TED compression stockings for 6 weeks. You may remove the stockings at night, but should reapply them in the morning. Do not drive or operate any equipment until instructed.  WOUND CARE:  The initial dressing (Aquacel) can remain in place for 7 days (see separate instructions). Continue to use the PolarCare or ice packs periodically to reduce pain and swelling. You may bathe or shower after the staples are removed at the first office visit following surgery.  MEDICATIONS: You may resume your regular medications. Please take the pain medication as prescribed on the medication. Do not take pain medication on an empty stomach. Unless instructed otherwise, you should take an enteric-coated aspirin  81 mg. TWICE a day. (This along with elevation will help reduce the possibility of blood clots/phlebitis in your operated leg.) Use a stool softener (such as Senokot-S or Colace) daily and a laxative (such as Miralax  or Dulcolax) as needed to prevent constipation.  Do not drive or drink alcoholic beverages when  taking pain medications.  CALL THE OFFICE FOR: Temperature above 101 degrees Excessive bleeding or drainage on the dressing. Excessive swelling, coldness, or paleness of the toes. Persistent nausea and vomiting.  FOLLOW-UP:  You should have an appointment to return to the office in 10-14 days after surgery. Arrangements have been made for continuation of Physical Therapy (either home therapy or outpatient therapy).     [facility] Department Directory         [email or web address]       [email or web address]          Cardiology  Appointments: [place] - [phone]  Endocrinology  Appointments: [place] [phone] [place] - [phone]  Gastroenterology  Appointments: [place] [phone] [place] - [phone]        General Surgery   Appointments: [facility]  Internal Medicine/Family Medicine  Appointments: [facility] - [phone] [place] - [phone]  Metabolic and Weigh Loss Surgery  Appointments: [facility]        Neurology  Appointments: [place] [phone] [place] - [phone]  Neurosurgery  Appointments: [place]  Obstetrics & Gynecology  Appointments: [place] [phone] [place] - [phone]        Pediatrics  Appointments: [place] [phone] [place] - [phone]  Physiatry  Appointments: [place] [phone]  Physical Therapy  Appointments: [place] - [phone]        Podiatry  Appointments: [place] [phone] [place] - [phone]  Pulmonology  Appointments: [place]  Rheumatology  Appointments: [place] [phone]  [place] Location: [facility]  [address] [place], [place]  [address]  [place] Location: [facility]. [address] [place], [place]  [address]  [place]  Location: [facility] [address] [place], [place]  [address]

## 2024-05-24 ENCOUNTER — Encounter
Admission: RE | Admit: 2024-05-24 | Discharge: 2024-05-24 | Disposition: A | Discharge status: Home / Self Care | Source: Ambulatory Visit | Attending: Orthopedic Surgery | Admitting: Orthopedic Surgery

## 2024-05-24 ENCOUNTER — Other Ambulatory Visit: Payer: Self-pay

## 2024-05-24 VITALS — HR 85 | Resp 12 | Ht 65.0 in | Wt 174.1 lb

## 2024-05-24 DIAGNOSIS — Z01812 Encounter for preprocedural laboratory examination: Secondary | ICD-10-CM | POA: Insufficient documentation

## 2024-05-24 DIAGNOSIS — M1711 Unilateral primary osteoarthritis, right knee: Secondary | ICD-10-CM | POA: Diagnosis not present

## 2024-05-24 DIAGNOSIS — Z01818 Encounter for other preprocedural examination: Secondary | ICD-10-CM

## 2024-05-24 HISTORY — DX: Nausea with vomiting, unspecified: Z98.890

## 2024-05-24 HISTORY — DX: Nicotine dependence, cigarettes, uncomplicated: F17.210

## 2024-05-24 HISTORY — DX: Personal history of Methicillin resistant Staphylococcus aureus infection: Z86.14

## 2024-05-24 HISTORY — DX: Unspecified right bundle-branch block: I45.10

## 2024-05-24 HISTORY — DX: Unilateral primary osteoarthritis, right knee: M17.11

## 2024-05-24 HISTORY — DX: Other ill-defined heart diseases: I51.89

## 2024-05-24 HISTORY — DX: Dyspnea, unspecified: R06.00

## 2024-05-24 HISTORY — DX: Nausea with vomiting, unspecified: R11.2

## 2024-05-24 LAB — URINALYSIS, ROUTINE W REFLEX MICROSCOPIC
Bacteria, UA: NONE SEEN
Bilirubin Urine: NEGATIVE
Glucose, UA: NEGATIVE mg/dL
Hgb urine dipstick: NEGATIVE
Ketones, ur: NEGATIVE mg/dL
Nitrite: NEGATIVE
Protein, ur: NEGATIVE mg/dL
RBC / HPF: 0 RBC/hpf (ref 0–5)
Specific Gravity, Urine: 1.01 (ref 1.005–1.030)
pH: 7 (ref 5.0–8.0)

## 2024-05-24 LAB — SURGICAL PCR SCREEN
MRSA, PCR: NEGATIVE
Staphylococcus aureus: NEGATIVE

## 2024-05-24 LAB — C-REACTIVE PROTEIN: CRP: 0.6 mg/dL (ref <1.0)

## 2024-05-24 LAB — SEDIMENTATION RATE: Sed Rate: 7 mm/h (ref 0–30)

## 2024-05-24 NOTE — Patient Instructions (Addendum)
 Your procedure is scheduled on:06-03-24 Monday Report to the Registration Desk on the 1st floor of the Medical Mall.Then proceed to the 2nd floor Surgery Desk To find out your arrival time, please call [phone] between 1PM - 3PM on:05-31-24 Friday If your arrival time is 6:00 am, do not arrive before that time as the Medical Mall entrance doors do not open until 6:00 am.  REMEMBER: Instructions that are not followed completely may result in serious medical risk, up to and including death; or upon the discretion of your surgeon and anesthesiologist your surgery may need to be rescheduled.  Do not eat food after midnight the night before surgery.  No gum chewing or hard candies.  You may however, drink CLEAR liquids up to 2 hours before you are scheduled to arrive for your surgery. Do not drink anything within 2 hours of your scheduled arrival time.  Clear liquids include: - water  - apple juice without pulp - gatorade (not RED colors) - black coffee or tea (Do NOT add milk or creamers to the coffee or tea) Do NOT drink anything that is not on this list.  In addition, your doctor has ordered for you to drink the provided:  Ensure Pre-Surgery Clear Carbohydrate Drink  Drinking this carbohydrate drink up to two hours before surgery helps to reduce insulin resistance and improve patient outcomes. Please complete drinking 2 hours before scheduled arrival time.  One week prior to surgery:Last dose will be on 05-26-24  Stop Anti-inflammatories (NSAIDS) such as Advil , Aleve , Ibuprofen , Motrin , Naproxen , Naprosyn  and Aspirin based products such as Excedrin, Goody's Powder, BC Powder. Stop ANY OVER THE COUNTER supplements until after surgery (Calcium Carbonate, Multivitamin)  You may however, continue to take Tylenol  if needed for pain up until the day of surgery.  Continue taking all of your other prescription medications up until the day of surgery.  ON THE DAY OF SURGERY ONLY TAKE THESE  MEDICATIONS WITH SIPS OF WATER: -ARIPiprazole (ABILIFY)  -buPROPion (WELLBUTRIN SR)  -busPIRone (BUSPAR)  -levothyroxine (SYNTHROID)  -memantine (NAMENDA)  -venlafaxine XR (EFFEXOR-XR)   No Alcohol for 24 hours before or after surgery.  No Smoking including e-cigarettes for 24 hours before surgery.  No chewable tobacco products for at least 6 hours before surgery.  No nicotine patches on the day of surgery.  Do not use any recreational drugs for at least a week (preferably 2 weeks) before your surgery.  Please be advised that the combination of cocaine and anesthesia may have negative outcomes, up to and including death. If you test positive for cocaine, your surgery will be cancelled.  On the morning of surgery brush your teeth with toothpaste and water, you may rinse your mouth with mouthwash if you wish. Do not swallow any toothpaste or mouthwash.  Use CHG Soap as directed on instruction sheet.  Do not wear jewelry, make-up, hairpins, clips or nail polish.  For welded (permanent) jewelry: bracelets, anklets, waist bands, etc.  Please have this removed prior to surgery.  If it is not removed, there is a chance that hospital personnel will need to cut it off on the day of surgery.  Do not wear lotions, powders, or perfumes.   Do not shave body hair from the neck down 48 hours before surgery.  Contact lenses, hearing aids and dentures may not be worn into surgery.  Do not bring valuables to the hospital. [facility] is not responsible for any missing/lost belongings or valuables.   Notify your doctor if  there is any change in your medical condition (cold, fever, infection).  Wear comfortable clothing (specific to your surgery type) to the hospital.  After surgery, you can help prevent lung complications by doing breathing exercises.  Take deep breaths and cough every 1-2 hours. Your doctor may order a device called an Incentive Spirometer to help you take deep breaths. When  coughing or sneezing, hold a pillow firmly against your incision with both hands. This is called "splinting." Doing this helps protect your incision. It also decreases belly discomfort.  If you are being admitted to the hospital overnight, leave your suitcase in the car. After surgery it may be brought to your room.  In case of increased patient census, it may be necessary for you, the patient, to continue your postoperative care in the Same Day Surgery department.  If you are being discharged the day of surgery, you will not be allowed to drive home. You will need a responsible individual to drive you home and stay with you for 24 hours after surgery.   If you are taking public transportation, you will need to have a responsible individual with you.  Please call the Pre-admissions Testing Dept. at [phone] if you have any questions about these instructions.  Surgery Visitation Policy:  Patients having surgery or a procedure may have two visitors.  Children under the age of 89 must have an adult with them who is not the patient.  Inpatient Visitation:    Visiting hours are 7 a.m. to 8 p.m. Up to four visitors are allowed at one time in a patient room. The visitors may rotate out with other people during the day.  One visitor age 1 or older may stay with the patient overnight and must be in the room by 8 p.m.    Pre-operative 4 CHG Bath Instructions   You can play a key role in reducing the risk of infection after surgery. Your skin needs to be as free of germs as possible. You can reduce the number of germs on your skin by washing with CHG (chlorhexidine gluconate) soap before surgery. CHG is an antiseptic soap that kills germs and continues to kill germs even after washing.   DO NOT use if you have an allergy to chlorhexidine/CHG or antibacterial soaps. If your skin becomes reddened or irritated, stop using the CHG and notify one of our RNs at [phone].   Please shower  with the CHG soap starting 4 days before surgery using the following schedule:     Please keep in mind the following:  DO NOT shave, including legs and underarms, starting the day of your first shower.   You may shave your face at any point before/day of surgery.  Place clean sheets on your bed the day you start using CHG soap. Use a clean washcloth (not used since being washed) for each shower. DO NOT sleep with pets once you start using the CHG.   CHG Shower Instructions:  If you choose to wash your hair and private area, wash first with your normal shampoo/soap.  After you use shampoo/soap, rinse your hair and body thoroughly to remove shampoo/soap residue.  Turn the water OFF and apply about 3 tablespoons (45 ml) of CHG soap to a CLEAN washcloth.  Apply CHG soap ONLY FROM YOUR NECK DOWN TO YOUR TOES (washing for 3-5 minutes)  DO NOT use CHG soap on face, private areas, open wounds, or sores.  Pay special attention to the area where your  surgery is being performed.  If you are having back surgery, having someone wash your back for you may be helpful. Wait 2 minutes after CHG soap is applied, then you may rinse off the CHG soap.  Pat dry with a clean towel  Put on clean clothes/pajamas   If you choose to wear lotion, please use ONLY the CHG-compatible lotions on the back of this paper.     Additional instructions for the day of surgery: DO NOT APPLY any lotions, deodorants, cologne, or perfumes.   Put on clean/comfortable clothes.  Brush your teeth.  Ask your nurse before applying any prescription medications to the skin.      CHG Compatible Lotions   Aveeno Moisturizing lotion  Cetaphil Moisturizing Cream  Cetaphil Moisturizing Lotion  Clairol Herbal Essence Moisturizing Lotion, Dry Skin  Clairol Herbal Essence Moisturizing Lotion, Extra Dry Skin  Clairol Herbal Essence Moisturizing Lotion, Normal Skin  Curel Age Defying Therapeutic Moisturizing Lotion with Alpha  Hydroxy  Curel Extreme Care Body Lotion  Curel Soothing Hands Moisturizing Hand Lotion  Curel Therapeutic Moisturizing Cream, Fragrance-Free  Curel Therapeutic Moisturizing Lotion, Fragrance-Free  Curel Therapeutic Moisturizing Lotion, Original Formula  Eucerin Daily Replenishing Lotion  Eucerin Dry Skin Therapy Plus Alpha Hydroxy Crme  Eucerin Dry Skin Therapy Plus Alpha Hydroxy Lotion  Eucerin Original Crme  Eucerin Original Lotion  Eucerin Plus Crme Eucerin Plus Lotion  Eucerin TriLipid Replenishing Lotion  Keri Anti-Bacterial Hand Lotion  Keri Deep Conditioning Original Lotion Dry Skin Formula Softly Scented  Keri Deep Conditioning Original Lotion, Fragrance Free Sensitive Skin Formula  Keri Lotion Fast Absorbing Fragrance Free Sensitive Skin Formula  Keri Lotion Fast Absorbing Softly Scented Dry Skin Formula  Keri Original Lotion  Keri Skin Renewal Lotion Keri Silky Smooth Lotion  Keri Silky Smooth Sensitive Skin Lotion  Nivea Body Creamy Conditioning Oil  Nivea Body Extra Enriched Lotion  Nivea Body Original Lotion  Nivea Body Sheer Moisturizing Lotion Nivea Crme  Nivea Skin Firming Lotion  NutraDerm 30 Skin Lotion  NutraDerm Skin Lotion  NutraDerm Therapeutic Skin Cream  NutraDerm Therapeutic Skin Lotion  ProShield Protective Hand Cream  Provon moisturizing lotion  How to Use an Incentive Spirometer An incentive spirometer is a tool that measures how well you are filling your lungs with each breath. Learning to take long, deep breaths using this tool can help you keep your lungs clear and active. This may help to reverse or lessen your chance of developing breathing (pulmonary) problems, especially infection. You may be asked to use a spirometer: After a surgery. If you have a lung problem or a history of smoking. After a long period of time when you have been unable to move or be active. If the spirometer includes an indicator to show the highest number that you have  reached, your health care provider or respiratory therapist will help you set a goal. Keep a log of your progress as told by your health care provider. What are the risks? Breathing too quickly may cause dizziness or cause you to pass out. Take your time so you do not get dizzy or light-headed. If you are in pain, you may need to take pain medicine before doing incentive spirometry. It is harder to take a deep breath if you are having pain. How to use your incentive spirometer  Sit up on the edge of your bed or on a chair. Hold the incentive spirometer so that it is in an upright position. Before you use the spirometer, breathe  out normally. Place the mouthpiece in your mouth. Make sure your lips are closed tightly around it. Breathe in slowly and as deeply as you can through your mouth, causing the piston or the ball to rise toward the top of the chamber. Hold your breath for 3-5 seconds, or for as long as possible. If the spirometer includes a coach indicator, use this to guide you in breathing. Slow down your breathing if the indicator goes above the marked areas. Remove the mouthpiece from your mouth and breathe out normally. The piston or ball will return to the bottom of the chamber. Rest for a few seconds, then repeat the steps 10 or more times. Take your time and take a few normal breaths between deep breaths so that you do not get dizzy or light-headed. Do this every 1-2 hours when you are awake. If the spirometer includes a goal marker to show the highest number you have reached (best effort), use this as a goal to work toward during each repetition. After each set of 10 deep breaths, cough a few times. This will help to make sure that your lungs are clear. If you have an incision on your chest or abdomen from surgery, place a pillow or a rolled-up towel firmly against the incision when you cough. This can help to reduce pain while taking deep breaths and coughing. General tips When  you are able to get out of bed: Walk around often. Continue to take deep breaths and cough in order to clear your lungs. Keep using the incentive spirometer until your health care provider says it is okay to stop using it. If you have been in the hospital, you may be told to keep using the spirometer at home. Contact a health care provider if: You are having difficulty using the spirometer. You have trouble using the spirometer as often as instructed. Your pain medicine is not giving enough relief for you to use the spirometer as told. You have a fever. Get help right away if: You develop shortness of breath. You develop a cough with bloody mucus from the lungs. You have fluid or blood coming from an incision site after you cough. Summary An incentive spirometer is a tool that can help you learn to take long, deep breaths to keep your lungs clear and active. You may be asked to use a spirometer after a surgery, if you have a lung problem or a history of smoking, or if you have been inactive for a long period of time. Use your incentive spirometer as instructed every 1-2 hours while you are awake. If you have an incision on your chest or abdomen, place a pillow or a rolled-up towel firmly against your incision when you cough. This will help to reduce pain. Get help right away if you have shortness of breath, you cough up bloody mucus, or blood comes from your incision when you cough. This information is not intended to replace advice given to you by your health care provider. Make sure you discuss any questions you have with your health care provider. Document Revised: 06/09/2023 Document Reviewed: 06/09/2023 Elsevier Patient Education  2024 Elsevier Inc.    Preoperative Educational Videos for Total Hip, Knee and Shoulder Replacements  To better prepare for surgery, please view our videos that explain the physical activity and discharge planning required to have the best surgical recovery  at [facility].  [email or web address]  Questions? Call [phone] or email [email or web address]  Merchandiser, retail to address health-related social needs:  [email or web address]

## 2024-06-02 ENCOUNTER — Encounter: Payer: Self-pay | Admitting: Orthopedic Surgery

## 2024-06-02 NOTE — H&P (Signed)
 ORTHOPAEDIC HISTORY & PHYSICAL Drake Fonda [place], [place] - 05/29/2024 3:30 PM EDT Formatting of this note is different from the original. NAME: [name] [name] H&P Date: 05/29/2024 Procedure Date: 06/03/2024  Chief Complaint: right knee pain, swelling, and stiffness  HPI [name] [name] is a 68 y.o. female who has severe Right knee pain. Patient reports a longstanding history of right knee discomfort. Patient states that she has had ongoing discomfort and has been evaluated at [facility] where she was told that she had had bone-on-bone arthritis. She states that most of the pain localizes along the lateral aspect of her knee. She does report intermittent swelling and feelings of giving way of the knee. Denies any locking or maltracking symptoms. She states that the pain is made worse with any weightbearing or prolonged ambulation. She states that it greatly affects her ability to perform her ADLs and ambulate long distances as she would like. She states that her pain today is at a 10/10, and is actually requesting some analgesic medications to be sent into her pharmacy. She states that she has tried utilizing Tylenol , NSAIDs, narcotic analgesics, intra-articular corticosteroid and viscosupplementation injections as well as activity modification without much benefit. She is not currently utilizing any ambulatory aids, but is walking very gingerly. She has requested operative intervention for relief of her DJD symptoms. Patient denies any previous cardiac history. She does report a history of pneumonia that she developed last year secondary to aspiration. No previous clots or DVTs. She is not a diabetic. She denies any previous surgeries on her right knee, but does report a history of a left total knee arthroplasty performed by Dr. Leora 3 years ago at [facility]. Of note, she also does report a history of a right ankle fracture with fixation that later became infected over 10 years ago.  Social Hx: Patient  lives at home by herself, but states that she will be having 2 friends look after her postoperatively. She denies any alcohol use, illicit drug use, smoking or nicotine use.  Medications & Allergies Allergies: Allergies Allergen Reactions Carbidopa-Levodopa Nausea Hydrocodone Unknown Zaleplon Other (See Comments)  Home Medicines: Current Outpatient Medications on File Prior to Visit Medication Sig Dispense Refill amitriptyline (ELAVIL) 50 MG tablet Take 150 mg by mouth at bedtime ARIPiprazole (ABILIFY) 10 MG tablet Take 10 mg by mouth once daily buPROPion (WELLBUTRIN SR) 150 MG SR tablet 2 in the am and 1 in the evening celecoxib  (CELEBREX ) 200 MG capsule Take 200 mg by mouth donepeziL (ARICEPT) 10 MG tablet esomeprazole  (NEXIUM ) 40 MG DR capsule Take 40 mg by mouth 2 (two) times daily levothyroxine (SYNTHROID) 112 MCG tablet memantine (NAMENDA) 10 MG tablet Take 1 tablet (10 mg total) by mouth 2 (two) times daily 60 tablet 11 multivitamin tablet Take 1 tablet by mouth daily. ondansetron  (ZOFRAN ) 8 MG tablet venlafaxine 150 mg ER tablet Take 2 tablets (300 mg total) by mouth once daily BD ULTRA-FINE NANO PEN NEEDLE 32 gauge x 5/32 Ndle (Patient not taking: Reported on 05/29/2024) CALCIUM CARBONATE/VITAMIN D3 (CALCIUM 500 + D, D3, ORAL) Take 650 mg by mouth 2 (two) times daily. (Patient not taking: Reported on 05/29/2024)  No current facility-administered medications on file prior to visit.  Medical / Surgical History  Past Medical History: Diagnosis Date Ankle fracture, right s/p hardware placement, complicated by infection; removed 05/04/12 Anxiety Depression 08/19/2016 Hypertension Hypothyroidism 08/19/2016 Mild cognitive impairment MSSA (methicillin susceptible Staphylococcus aureus) infection 05/14/2012 Right ankle Osteoporosis 04/07/2024   Past Surgical History: Procedure Laterality  Date ORIF right bimalleolar ankle fracture 10/26/2011 Dr. Marchia Hardware  removal right ankle 05/14/2012 Dr. Marchia THUMB FUSION TONSILLECTOMY   Physical Exam  Ht:165.1 cm (5' 5) Wt:78.4 kg (172 lb 12.8 oz) BMI: Body mass index is 28.76 kg/m.  General/Constitutional: No apparent distress: well-nourished and well developed. Eyes: Pupils equal, round with synchronous movement. Lymphatic: No palpable adenopathy. Respiratory: Patient has good chest rise and fall with inspiration and expiration. All lung fields are clear to auscultation bilaterally. There is no Rales, rhonchi or wheezes appreciated. Cardiovascular: Upon auscultation there is a regular rate and rhythm without any murmurs, rubs, gallops or heaves appreciated. There does not appear to be any swelling down the lower extremities. Posterior tibial pulses appreciated bilaterally, 2+. Integumentary: No impressive skin lesions present, except as noted in detailed exam. Neuro/Psych: Normal mood and affect, oriented to person, place and time. Musculoskeletal: see exam below  Right knee exam Upon inspection of the patient's right knee there does not appear to be any skin changes, open abrasions, swelling or redness. There is a valgus alignment. Upon palpation, the patient reports having pain along the lateral aspect of their knee. Patient has 4 degrees off of full extension actively with ROM, and able to flex back to approximately 94 degrees with moderate pain, and asked to stop at this juncture. Varus and valgus stress testing shows positive pseudolaxity to varus stressing. The patella tracks well within the femoral groove from flexion into extension with crepitus appreciated. Anterior and posterior drawer testing negative. Patient is neurovascularly intact down their lower extremity to all dermatomes. Posterior tibial pulses appreciated 2+.  Imaging right Knee Imaging: None ordered today. Previous images of the patient's right knee were reviewed. These images showed significant narrowing of the lateral  cartilage space with near bone-on-bone articulation noted. Osteophyte formation is present. Subchondral changes are appreciated. Overall alignment is relative valgus. No fractures, lytic lesions or gross deformities appreciated on films.  Assesment and Plan Knee DJD  I have recommended that [name] [name] undergo right total knee replacement. Consents has been signed. The risks, benefits, prognosis and alternatives including but not limited to DVT, PE, infection, neurovascular injury, failure of the procedure and death were explained to the patient and she is willing to proceed with surgery as described to her by myself. Plan will be for post operative admission of at least 1 midnight for pain control and PT. She will be managed with DVT prophylaxis, antibiotics preoperatively for 24 hours and aggressive in patient rehab.  Patient was sent in a very short course of Norco to help with her underlying discomfort.  Pre, intra and post op interventions were discussed. Patient has good understanding  Medication Reconciliation was performed. Discussed cessation of NSAIDs, vitamins and supplements.  A total of 40 minutes was spent reviewing patient's charts, medical reconciliation, discussing/educating the patient about surgical interventions, and answering any questions provided by the patient.  JOSHUA DALLAS KOYANAGI, PA [facility] 05/29/2024  Electronically signed by KOYANAGI Fonda DALLAS, PA at 05/29/2024 6:00 PM EDT Electronically signed by KOYANAGI Fonda DALLAS, PA at 05/29/2024 6:07 PM EDT

## 2024-06-03 ENCOUNTER — Ambulatory Visit: Payer: Self-pay | Admitting: Urgent Care

## 2024-06-03 ENCOUNTER — Observation Stay

## 2024-06-03 ENCOUNTER — Encounter
Admission: RE | Disposition: A | Discharge status: Home / Self Care | Payer: Self-pay | Source: Home / Self Care | Attending: Orthopedic Surgery

## 2024-06-03 ENCOUNTER — Observation Stay
Admission: RE | Admit: 2024-06-03 | Discharge: 2024-06-04 | Disposition: A | Attending: Orthopedic Surgery | Admitting: Orthopedic Surgery

## 2024-06-03 ENCOUNTER — Other Ambulatory Visit: Payer: Self-pay

## 2024-06-03 ENCOUNTER — Ambulatory Visit

## 2024-06-03 ENCOUNTER — Encounter: Payer: Self-pay | Admitting: Orthopedic Surgery

## 2024-06-03 DIAGNOSIS — M1711 Unilateral primary osteoarthritis, right knee: Principal | ICD-10-CM | POA: Insufficient documentation

## 2024-06-03 DIAGNOSIS — Z96651 Presence of right artificial knee joint: Secondary | ICD-10-CM | POA: Diagnosis not present

## 2024-06-03 DIAGNOSIS — M810 Age-related osteoporosis without current pathological fracture: Secondary | ICD-10-CM | POA: Insufficient documentation

## 2024-06-03 DIAGNOSIS — Z7982 Long term (current) use of aspirin: Secondary | ICD-10-CM | POA: Diagnosis not present

## 2024-06-03 DIAGNOSIS — I10 Essential (primary) hypertension: Secondary | ICD-10-CM | POA: Insufficient documentation

## 2024-06-03 DIAGNOSIS — E039 Hypothyroidism, unspecified: Secondary | ICD-10-CM | POA: Diagnosis not present

## 2024-06-03 HISTORY — PX: KNEE ARTHROPLASTY: SHX992

## 2024-06-03 SURGERY — ARTHROPLASTY, KNEE, TOTAL, USING IMAGELESS COMPUTER-ASSISTED NAVIGATION
Anesthesia: Spinal | Site: Knee | Laterality: Right

## 2024-06-03 MED ORDER — MAGNESIUM HYDROXIDE 400 MG/5ML PO SUSP
30.0000 mL | Freq: Every day | ORAL | Status: DC
  Filled 2024-06-03 (×2): qty 30

## 2024-06-03 MED ORDER — PHENYLEPHRINE HCL-NACL 20-0.9 MG/250ML-% IV SOLN
INTRAVENOUS | Status: DC | PRN
  Administered 2024-06-03: 10 ug/min via INTRAVENOUS

## 2024-06-03 MED ORDER — ONDANSETRON HCL 4 MG PO TABS: 4.0000 mg | ORAL_TABLET | Freq: Four times a day (QID) | ORAL | Status: DC | PRN

## 2024-06-03 MED ORDER — AMITRIPTYLINE HCL 25 MG PO TABS
150.0000 mg | ORAL_TABLET | Freq: Every day | ORAL | Status: DC
Start: 2024-06-03 — End: 2024-06-04
  Administered 2024-06-03: 150 mg via ORAL
  Filled 2024-06-03: qty 6

## 2024-06-03 MED ORDER — PHENOL 1.4 % MT LIQD: 1.0000 | OROMUCOSAL | Status: DC | PRN

## 2024-06-03 MED ORDER — CEFAZOLIN SODIUM-DEXTROSE 2-4 GM/100ML-% IV SOLN
INTRAVENOUS | Status: AC
  Filled 2024-06-03: qty 100

## 2024-06-03 MED ORDER — CHLORHEXIDINE GLUCONATE 4 % EX SOLN: 60.0000 mL | Freq: Once | CUTANEOUS | Status: DC

## 2024-06-03 MED ORDER — BUPIVACAINE HCL (PF) 0.25 % IJ SOLN
INTRAMUSCULAR | Status: DC | PRN
  Administered 2024-06-03: 60 mL

## 2024-06-03 MED ORDER — TRANEXAMIC ACID-NACL 1000-0.7 MG/100ML-% IV SOLN
INTRAVENOUS | Status: AC
  Filled 2024-06-03: qty 100

## 2024-06-03 MED ORDER — BUSPIRONE HCL 10 MG PO TABS
10.0000 mg | ORAL_TABLET | Freq: Three times a day (TID) | ORAL | Status: DC
Start: 2024-06-03 — End: 2024-06-04
  Administered 2024-06-03 – 2024-06-04 (×2): 10 mg via ORAL
  Filled 2024-06-03 (×2): qty 1

## 2024-06-03 MED ORDER — TRAMADOL HCL 50 MG PO TABS: 50.0000 mg | ORAL_TABLET | ORAL | Status: DC | PRN

## 2024-06-03 MED ORDER — ACETAMINOPHEN 325 MG PO TABS: 325.0000 mg | ORAL_TABLET | Freq: Four times a day (QID) | ORAL | Status: DC | PRN

## 2024-06-03 MED ORDER — ACETAMINOPHEN 10 MG/ML IV SOLN
INTRAVENOUS | Status: DC | PRN
  Administered 2024-06-03: 1000 mg via INTRAVENOUS

## 2024-06-03 MED ORDER — PROPOFOL 500 MG/50ML IV EMUL
INTRAVENOUS | Status: DC | PRN
Start: 2024-06-03 — End: 2024-06-03
  Administered 2024-06-03: 125 ug/kg/min via INTRAVENOUS
  Administered 2024-06-03: 50 mg via INTRAVENOUS

## 2024-06-03 MED ORDER — CEFAZOLIN SODIUM-DEXTROSE 2-4 GM/100ML-% IV SOLN
2.0000 g | Freq: Four times a day (QID) | INTRAVENOUS | Status: AC
  Administered 2024-06-03 – 2024-06-04 (×2): 2 g via INTRAVENOUS

## 2024-06-03 MED ORDER — ARIPIPRAZOLE 2 MG PO TABS
5.0000 mg | ORAL_TABLET | Freq: Every day | ORAL | Status: DC
  Administered 2024-06-04: 5 mg via ORAL
  Filled 2024-06-03: qty 3

## 2024-06-03 MED ORDER — LEVOTHYROXINE SODIUM 112 MCG PO TABS
112.0000 ug | ORAL_TABLET | Freq: Every day | ORAL | Status: DC
  Administered 2024-06-04: 112 ug via ORAL
  Filled 2024-06-03: qty 1

## 2024-06-03 MED ORDER — MEMANTINE HCL 5 MG PO TABS
5.0000 mg | ORAL_TABLET | Freq: Two times a day (BID) | ORAL | Status: DC
  Administered 2024-06-03 – 2024-06-04 (×2): 5 mg via ORAL
  Filled 2024-06-03 (×2): qty 1

## 2024-06-03 MED ORDER — FENTANYL CITRATE (PF) 100 MCG/2ML IJ SOLN
INTRAMUSCULAR | Status: AC
  Filled 2024-06-03: qty 2

## 2024-06-03 MED ORDER — BUPROPION HCL ER (SR) 150 MG PO TB12
150.0000 mg | ORAL_TABLET | Freq: Every day | ORAL | Status: DC
Start: 2024-06-03 — End: 2024-06-04
  Administered 2024-06-03: 150 mg via ORAL
  Filled 2024-06-03 (×2): qty 1

## 2024-06-03 MED ORDER — LACTATED RINGERS IV SOLN: INTRAVENOUS | Status: DC

## 2024-06-03 MED ORDER — SENNOSIDES-DOCUSATE SODIUM 8.6-50 MG PO TABS
1.0000 | ORAL_TABLET | Freq: Two times a day (BID) | ORAL | Status: DC
Start: 2024-06-03 — End: 2024-06-04
  Administered 2024-06-03 – 2024-06-04 (×2): 1 via ORAL
  Filled 2024-06-03 (×2): qty 1

## 2024-06-03 MED ORDER — PANTOPRAZOLE SODIUM 40 MG PO TBEC
40.0000 mg | DELAYED_RELEASE_TABLET | Freq: Two times a day (BID) | ORAL | Status: DC
  Administered 2024-06-03 – 2024-06-04 (×2): 40 mg via ORAL
  Filled 2024-06-03 (×2): qty 1

## 2024-06-03 MED ORDER — CELECOXIB 200 MG PO CAPS
ORAL_CAPSULE | ORAL | Status: AC
  Filled 2024-06-03: qty 2

## 2024-06-03 MED ORDER — MEMANTINE HCL 5 MG PO TABS
10.0000 mg | ORAL_TABLET | Freq: Two times a day (BID) | ORAL | Status: DC
  Administered 2024-06-03 – 2024-06-04 (×2): 10 mg via ORAL
  Filled 2024-06-03 (×2): qty 2

## 2024-06-03 MED ORDER — HYDROMORPHONE HCL 1 MG/ML IJ SOLN: 0.5000 mg | INTRAMUSCULAR | Status: DC | PRN

## 2024-06-03 MED ORDER — CELECOXIB 200 MG PO CAPS
200.0000 mg | ORAL_CAPSULE | Freq: Two times a day (BID) | ORAL | Status: DC
  Administered 2024-06-03 – 2024-06-04 (×2): 200 mg via ORAL
  Filled 2024-06-03 (×2): qty 1

## 2024-06-03 MED ORDER — MIRABEGRON ER 25 MG PO TB24
25.0000 mg | ORAL_TABLET | Freq: Every day | ORAL | Status: DC
Start: 2024-06-04 — End: 2024-06-04
  Administered 2024-06-04: 25 mg via ORAL
  Filled 2024-06-03: qty 1

## 2024-06-03 MED ORDER — ACETAMINOPHEN 10 MG/ML IV SOLN
INTRAVENOUS | Status: AC
  Filled 2024-06-03: qty 100

## 2024-06-03 MED ORDER — DONEPEZIL HCL 5 MG PO TABS
10.0000 mg | ORAL_TABLET | Freq: Every day | ORAL | Status: DC
  Administered 2024-06-03: 10 mg via ORAL
  Filled 2024-06-03: qty 2

## 2024-06-03 MED ORDER — DEXAMETHASONE SOD PHOSPHATE PF 10 MG/ML IJ SOLN
8.0000 mg | Freq: Once | INTRAMUSCULAR | Status: AC
  Administered 2024-06-03: 8 mg via INTRAVENOUS

## 2024-06-03 MED ORDER — OXYCODONE HCL 5 MG PO TABS
10.0000 mg | ORAL_TABLET | ORAL | Status: DC | PRN
  Administered 2024-06-03 – 2024-06-04 (×3): 10 mg via ORAL
  Filled 2024-06-03 (×3): qty 2

## 2024-06-03 MED ORDER — CHLORHEXIDINE GLUCONATE 0.12 % MT SOLN
OROMUCOSAL | Status: AC
Start: 2024-06-03 — End: 2024-06-03
  Filled 2024-06-03: qty 15

## 2024-06-03 MED ORDER — ASPIRIN 81 MG PO CHEW
81.0000 mg | CHEWABLE_TABLET | Freq: Two times a day (BID) | ORAL | Status: DC
  Administered 2024-06-04: 81 mg via ORAL
  Filled 2024-06-03: qty 1

## 2024-06-03 MED ORDER — CELECOXIB 200 MG PO CAPS
400.0000 mg | ORAL_CAPSULE | Freq: Once | ORAL | Status: AC
  Administered 2024-06-03: 400 mg via ORAL

## 2024-06-03 MED ORDER — TRANEXAMIC ACID-NACL 1000-0.7 MG/100ML-% IV SOLN
1000.0000 mg | INTRAVENOUS | Status: AC
  Administered 2024-06-03: 1000 mg via INTRAVENOUS

## 2024-06-03 MED ORDER — SODIUM CHLORIDE 0.9 % IR SOLN
Status: DC | PRN
  Administered 2024-06-03: 3000 mL

## 2024-06-03 MED ORDER — BUPROPION HCL ER (SR) 150 MG PO TB12
300.0000 mg | ORAL_TABLET | Freq: Every day | ORAL | Status: DC
  Administered 2024-06-04: 300 mg via ORAL
  Filled 2024-06-03: qty 2

## 2024-06-03 MED ORDER — MENTHOL 3 MG MT LOZG: 1.0000 | LOZENGE | OROMUCOSAL | Status: DC | PRN

## 2024-06-03 MED ORDER — METOCLOPRAMIDE HCL 10 MG PO TABS
10.0000 mg | ORAL_TABLET | Freq: Three times a day (TID) | ORAL | Status: DC
  Administered 2024-06-03 – 2024-06-04 (×3): 10 mg via ORAL
  Filled 2024-06-03 (×3): qty 1

## 2024-06-03 MED ORDER — SODIUM CHLORIDE 0.9 % IV SOLN
INTRAVENOUS | Status: DC
  Administered 2024-06-03: 400 mL via INTRAVENOUS

## 2024-06-03 MED ORDER — ALUM & MAG HYDROXIDE-SIMETH 200-200-20 MG/5ML PO SUSP: 30.0000 mL | ORAL | Status: DC | PRN

## 2024-06-03 MED ORDER — TRANEXAMIC ACID-NACL 1000-0.7 MG/100ML-% IV SOLN
1000.0000 mg | Freq: Once | INTRAVENOUS | Status: AC
  Administered 2024-06-03: 1000 mg via INTRAVENOUS

## 2024-06-03 MED ORDER — FLEET ENEMA RE ENEM
1.0000 | ENEMA | Freq: Once | RECTAL | Status: DC | PRN
Start: 2024-06-03 — End: 2024-06-04

## 2024-06-03 MED ORDER — GABAPENTIN 300 MG PO CAPS
ORAL_CAPSULE | ORAL | Status: AC
  Filled 2024-06-03: qty 1

## 2024-06-03 MED ORDER — FERROUS SULFATE 325 (65 FE) MG PO TABS
325.0000 mg | ORAL_TABLET | Freq: Two times a day (BID) | ORAL | Status: DC
  Administered 2024-06-04: 325 mg via ORAL
  Filled 2024-06-03: qty 1

## 2024-06-03 MED ORDER — MIDAZOLAM HCL 2 MG/2ML IJ SOLN
INTRAMUSCULAR | Status: AC
  Filled 2024-06-03: qty 2

## 2024-06-03 MED ORDER — CEFAZOLIN SODIUM-DEXTROSE 2-4 GM/100ML-% IV SOLN
2.0000 g | INTRAVENOUS | Status: AC
  Administered 2024-06-03: 2 g via INTRAVENOUS

## 2024-06-03 MED ORDER — OXYCODONE HCL 5 MG PO TABS: 5.0000 mg | ORAL_TABLET | ORAL | Status: DC | PRN

## 2024-06-03 MED ORDER — ONDANSETRON HCL 4 MG/2ML IJ SOLN: 4.0000 mg | Freq: Four times a day (QID) | INTRAMUSCULAR | Status: DC | PRN

## 2024-06-03 MED ORDER — ENSURE PRE-SURGERY PO LIQD
296.0000 mL | Freq: Once | ORAL | Status: DC
  Filled 2024-06-03: qty 296

## 2024-06-03 MED ORDER — SODIUM CHLORIDE 0.9 % IV SOLN
INTRAVENOUS | Status: DC | PRN
  Administered 2024-06-03: 60 mL

## 2024-06-03 MED ORDER — ACETAMINOPHEN 10 MG/ML IV SOLN
1000.0000 mg | Freq: Four times a day (QID) | INTRAVENOUS | Status: DC
  Administered 2024-06-03 – 2024-06-04 (×3): 1000 mg via INTRAVENOUS
  Filled 2024-06-03 (×3): qty 100

## 2024-06-03 MED ORDER — MIDAZOLAM HCL 5 MG/5ML IJ SOLN
INTRAMUSCULAR | Status: DC | PRN
Start: 2024-06-03 — End: 2024-06-03
  Administered 2024-06-03: 2 mg via INTRAVENOUS

## 2024-06-03 MED ORDER — ARIPIPRAZOLE 2 MG PO TABS
10.0000 mg | ORAL_TABLET | Freq: Every day | ORAL | Status: DC
  Administered 2024-06-04: 10 mg via ORAL
  Filled 2024-06-03: qty 5

## 2024-06-03 MED ORDER — BISACODYL 10 MG RE SUPP: 10.0000 mg | Freq: Every day | RECTAL | Status: DC | PRN

## 2024-06-03 MED ORDER — ORAL CARE MOUTH RINSE: 15.0000 mL | Freq: Once | OROMUCOSAL | Status: AC

## 2024-06-03 MED ORDER — SURGIPHOR WOUND IRRIGATION SYSTEM - OPTIME
TOPICAL | Status: DC | PRN
  Administered 2024-06-03: 450 mL via TOPICAL

## 2024-06-03 MED ORDER — PHENYLEPHRINE HCL-NACL 20-0.9 MG/250ML-% IV SOLN
INTRAVENOUS | Status: AC
Start: 2024-06-03 — End: 2024-06-03
  Filled 2024-06-03: qty 250

## 2024-06-03 MED ORDER — PROPOFOL 1000 MG/100ML IV EMUL
INTRAVENOUS | Status: AC
Start: 2024-06-03 — End: 2024-06-03
  Filled 2024-06-03: qty 100

## 2024-06-03 MED ORDER — POLYVINYL ALCOHOL 1.4 % OP SOLN: 1.0000 [drp] | Freq: Every day | OPHTHALMIC | Status: DC | PRN

## 2024-06-03 MED ORDER — CHLORHEXIDINE GLUCONATE 0.12 % MT SOLN
15.0000 mL | Freq: Once | OROMUCOSAL | Status: AC
  Administered 2024-06-03: 15 mL via OROMUCOSAL

## 2024-06-03 MED ORDER — GABAPENTIN 300 MG PO CAPS
300.0000 mg | ORAL_CAPSULE | Freq: Once | ORAL | Status: AC
  Administered 2024-06-03: 300 mg via ORAL

## 2024-06-03 MED ORDER — DIPHENHYDRAMINE HCL 12.5 MG/5ML PO ELIX
12.5000 mg | ORAL_SOLUTION | ORAL | Status: DC | PRN
  Administered 2024-06-04: 25 mg via ORAL
  Filled 2024-06-03: qty 10

## 2024-06-03 MED ORDER — LIDOCAINE HCL (PF) 2 % IJ SOLN
INTRAMUSCULAR | Status: AC
  Filled 2024-06-03: qty 5

## 2024-06-03 MED ORDER — FENTANYL CITRATE (PF) 100 MCG/2ML IJ SOLN: 25.0000 ug | INTRAMUSCULAR | Status: DC | PRN

## 2024-06-03 MED ORDER — BUPIVACAINE HCL (PF) 0.5 % IJ SOLN
INTRAMUSCULAR | Status: DC | PRN
  Administered 2024-06-03: 3 mL

## 2024-06-03 MED ORDER — VENLAFAXINE HCL ER 150 MG PO CP24
300.0000 mg | ORAL_CAPSULE | Freq: Every day | ORAL | Status: DC
Start: 2024-06-04 — End: 2024-06-04
  Administered 2024-06-04: 300 mg via ORAL
  Filled 2024-06-03 (×2): qty 2

## 2024-06-03 MED ORDER — BUPROPION HCL ER (SR) 150 MG PO TB12: 150.0000 mg | ORAL_TABLET | ORAL | Status: DC

## 2024-06-03 MED ORDER — PROPOFOL 1000 MG/100ML IV EMUL
INTRAVENOUS | Status: AC
  Filled 2024-06-03: qty 100

## 2024-06-03 SURGICAL SUPPLY — 65 items
ATTUNE PSFEM RTSZ6 NARCEM KNEE (Femur) IMPLANT
ATTUNE PSRP INSR SZ6 5 KNEE (Insert) IMPLANT
BASE TIBIAL ROT PLAT SZ 5 KNEE (Knees) IMPLANT
BATTERY INSTRU NAVIGATION (MISCELLANEOUS) ×4 IMPLANT
BIT DRILL QUICK REL 1/8 2PK SL (BIT) ×1 IMPLANT
BLADE CLIPPER SURG (BLADE) IMPLANT
BLADE SAW 70X12.5 (BLADE) ×1 IMPLANT
BLADE SAW 90X13X1.19 OSCILLAT (BLADE) ×1 IMPLANT
BLADE SAW 90X25X1.19 OSCILLAT (BLADE) ×1 IMPLANT
BRUSH SCRUB EZ PLAIN DRY (MISCELLANEOUS) ×1 IMPLANT
CEMENT BONE GENTAMICIN 40 (Cement) IMPLANT
COOLER ICEMAN CLASSIC (MISCELLANEOUS) ×1 IMPLANT
CUFF TRNQT CYL 24X4X16.5-23 (TOURNIQUET CUFF) IMPLANT
CUFF TRNQT CYL 30X4X21-28X (TOURNIQUET CUFF) IMPLANT
DRAPE SHEET LG 3/4 BI-LAMINATE (DRAPES) ×1 IMPLANT
DRSG AQUACEL AG ADV 3.5X14 (GAUZE/BANDAGES/DRESSINGS) ×1 IMPLANT
DRSG MEPILEX SACRM 8.7X9.8 (GAUZE/BANDAGES/DRESSINGS) ×1 IMPLANT
DRSG OPSITE POSTOP 4X14 (GAUZE/BANDAGES/DRESSINGS) IMPLANT
DRSG TEGADERM 4X4.75 (GAUZE/BANDAGES/DRESSINGS) ×1 IMPLANT
DRSG XEROFORM 1X8 (GAUZE/BANDAGES/DRESSINGS) IMPLANT
DURAPREP 26ML APPLICATOR (WOUND CARE) ×2 IMPLANT
ELECT CAUTERY BLADE 6.4 (BLADE) ×1 IMPLANT
ELECTRODE REM PT RTRN 9FT ADLT (ELECTROSURGICAL) ×1 IMPLANT
EVACUATOR 1/8 PVC DRAIN (DRAIN) ×1 IMPLANT
EX-PIN ORTHOLOCK NAV 4X150 (PIN) ×2 IMPLANT
GAUZE XEROFORM 1X8 LF (GAUZE/BANDAGES/DRESSINGS) ×1 IMPLANT
GLOVE BIOGEL M STRL SZ7.5 (GLOVE) ×6 IMPLANT
GLOVE BIOGEL PI IND STRL 8 (GLOVE) ×1 IMPLANT
GLOVE SRG 8 PF TXTR STRL LF DI (GLOVE) ×1 IMPLANT
GOWN STRL REUS W/ TWL LRG LVL3 (GOWN DISPOSABLE) ×1 IMPLANT
GOWN STRL REUS W/ TWL XL LVL3 (GOWN DISPOSABLE) ×1 IMPLANT
GOWN TOGA ZIPPER T7+ PEEL AWAY (MISCELLANEOUS) ×1 IMPLANT
HOLDER FOLEY CATH W/STRAP (MISCELLANEOUS) ×1 IMPLANT
HOOD PEEL AWAY T7 (MISCELLANEOUS) ×1 IMPLANT
KIT TURNOVER KIT A (KITS) ×1 IMPLANT
KNIFE SCULPS 14X20 (INSTRUMENTS) ×1 IMPLANT
MANIFOLD NEPTUNE II (INSTRUMENTS) ×2 IMPLANT
NDL SPNL 20GX3.5 QUINCKE YW (NEEDLE) ×2 IMPLANT
NEEDLE SPNL 20GX3.5 QUINCKE YW (NEEDLE) ×2 IMPLANT
PACK TOTAL KNEE (MISCELLANEOUS) ×1 IMPLANT
PAD ABD DERMACEA PRESS 5X9 (GAUZE/BANDAGES/DRESSINGS) ×2 IMPLANT
PAD ARMBOARD POSITIONER FOAM (MISCELLANEOUS) ×3 IMPLANT
PAD COLD UNI WRAP-ON (PAD) ×1 IMPLANT
PATELLA MEDIAL ATTUN 35MM KNEE (Knees) IMPLANT
PENCIL SMOKE EVACUATOR COATED (MISCELLANEOUS) ×1 IMPLANT
PIN DRILL FIX HALF THREAD (BIT) ×2 IMPLANT
PIN FIXATION 1/8DIA X 3INL (PIN) ×1 IMPLANT
SOL .9 NS 3000ML IRR UROMATIC (IV SOLUTION) ×1 IMPLANT
SOLN STERILE WATER BTL 1000 ML (IV SOLUTION) ×1 IMPLANT
SOLUTION IRRIG SURGIPHOR (IV SOLUTION) ×1 IMPLANT
SPONGE DRAIN TRACH 4X4 STRL 2S (GAUZE/BANDAGES/DRESSINGS) ×1 IMPLANT
STAPLER SKIN PROX 35W (STAPLE) ×1 IMPLANT
STOCKINETTE IMPERV 14X48 (MISCELLANEOUS) ×1 IMPLANT
STOCKINETTE STRL BIAS CUT 8X4 (MISCELLANEOUS) ×1 IMPLANT
STRAP TIBIA SHORT (MISCELLANEOUS) ×1 IMPLANT
SUCTION TUBE FRAZIER 10FR DISP (SUCTIONS) ×1 IMPLANT
SUT VIC AB 0 CT1 36 (SUTURE) ×1 IMPLANT
SUT VIC AB 1 CT1 36 (SUTURE) ×2 IMPLANT
SUT VIC AB 2-0 CT2 27 (SUTURE) ×1 IMPLANT
SYR 30ML LL (SYRINGE) ×2 IMPLANT
TIP FAN IRRIG PULSAVAC PLUS (DISPOSABLE) ×1 IMPLANT
TOWEL OR 17X26 4PK STRL BLUE (TOWEL DISPOSABLE) IMPLANT
TOWER CARTRIDGE SMART MIX (DISPOSABLE) ×1 IMPLANT
TRAP FLUID SMOKE EVACUATOR (MISCELLANEOUS) ×1 IMPLANT
TRAY FOLEY MTR SLVR 16FR STAT (SET/KITS/TRAYS/PACK) ×1 IMPLANT

## 2024-06-03 NOTE — Interval H&P Note (Signed)
 History and Physical Interval Note:  06/03/2024 11:26 AM  [name] [name]  has presented today for surgery, with the diagnosis of Primary osteoarthritis of right knee.  The various methods of treatment have been discussed with the patient and family. After consideration of risks, benefits and other options for treatment, the patient has consented to  Procedure(s): ARTHROPLASTY, KNEE, TOTAL, USING IMAGELESS COMPUTER-ASSISTED NAVIGATION (Right) as a surgical intervention.  The patient's history has been reviewed, patient examined, no change in status, stable for surgery.  I have reviewed the patient's chart and labs.  Questions were answered to the patient's satisfaction.     [place] Hanlon P Khaliyah Northrop

## 2024-06-03 NOTE — Anesthesia Procedure Notes (Signed)
 Spinal  Patient location during procedure: OR Start time: 06/03/2024 12:27 PM Reason for block: surgical anesthesia Staffing Performed: anesthesiologist  Anesthesiologist: Dario Barter, MD Performed by: Bonnetta Jimmey SAUNDERS, CRNA Authorized by: Dario Barter, MD   Preanesthetic Checklist Completed: patient identified, IV checked, site marked, risks and benefits discussed, surgical consent, monitors and equipment checked, pre-op evaluation and timeout performed Spinal Block Patient position: sitting Prep: DuraPrep Patient monitoring: heart rate, cardiac monitor, continuous pulse ox and blood pressure Approach: midline Location: L3-4 Injection technique: single-shot Needle Needle type: Pencan  Needle gauge: 24 G Needle length: 9 cm Assessment Sensory level: T10 Events: CSF return Additional Notes Meticulous sterile technique used throughout (CHG prep, sterile gloves, sterile drape). Negative paresthesia. Negative blood return. Positive free-flowing CSF. Expiration date of kit checked and confirmed. Patient tolerated procedure well, without complications.

## 2024-06-03 NOTE — Progress Notes (Signed)
 Patient is not able to walk the distance required to go the bathroom, or he/she is unable to safely negotiate stairs required to access the bathroom.  A 3in1 BSC will alleviate this problem   Amenda Duclos P. Angie Fava M.D.

## 2024-06-03 NOTE — Anesthesia Preprocedure Evaluation (Signed)
 Anesthesia Evaluation  Patient identified by MRN, date of birth, ID band Patient awake    Reviewed: Allergy & Precautions, H&P , NPO status , Patient's Chart, lab work & pertinent test results, reviewed documented beta blocker date and time   History of Anesthesia Complications (+) PONV and history of anesthetic complications  Airway Mallampati: III  TM Distance: >3 FB Neck ROM: full    Dental  (+) Dental Advidsory Given, Upper Dentures, Lower Dentures   Pulmonary neg shortness of breath, neg sleep apnea, neg COPD, neg recent URI, former smoker   Pulmonary exam normal breath sounds clear to auscultation       Cardiovascular Exercise Tolerance: Good (-) hypertension(-) angina (-) Past MI, (-) Cardiac Stents and (-) CABG Normal cardiovascular exam+ dysrhythmias (RBBB) (-) Valvular Problems/Murmurs Rhythm:regular Rate:Normal     Neuro/Psych  PSYCHIATRIC DISORDERS  Depression    negative neurological ROS     GI/Hepatic ,GERD  ,,(+) Hepatitis -, C  Endo/Other  neg diabetesHypothyroidism    Renal/GU negative Renal ROS  negative genitourinary   Musculoskeletal   Abdominal   Peds  Hematology negative hematology ROS (+)   Anesthesia Other Findings Fell yesterday with large bruise around right eye.  No evidence of intracranial hemorrhage.  Denies headaches, nausea, changes in vision, new weakness, or new numbness.  Past Medical History: No date: Actinic keratosis No date: Arthritis     Comment:  fingers 12/13/2022: Basal cell carcinoma     Comment:  left upper abdomen, EDC No date: Carpal tunnel syndrome of left wrist No date: Cigarette smoker No date: Depression No date: Dyspnea No date: GERD (gastroesophageal reflux disease) No date: Grade I diastolic dysfunction 1985: Hepatitis C     Comment:  took treatment No date: History of methicillin resistant staphylococcus aureus (MRSA) No date: Hypothyroidism No date:  Insomnia No date: Memory loss No date: Osteoarthritis of right knee No date: Osteoporosis No date: PONV (postoperative nausea and vomiting)     Comment:  n/v with tonsillectomy No date: RBBB (right bundle branch block) 03/06/2023: SCC (squamous cell carcinoma)     Comment:  left lower lateral leg, clear with bx on 06/20/23, Mohs               cancelled 12/27/2021: Squamous cell carcinoma of skin     Comment:  Left pretibial - EDC 04/03/2024: Squamous cell carcinoma of skin     Comment:  L upper knee, EDC No date: Tremor No date: Wears dentures     Comment:  full upper, partial lower   Reproductive/Obstetrics negative OB ROS                              Anesthesia Physical Anesthesia Plan  ASA: 2  Anesthesia Plan: Spinal   Post-op Pain Management:    Induction: Intravenous  PONV Risk Score and Plan: 3 and Propofol infusion and TIVA  Airway Management Planned: Natural Airway and Simple Face Mask  Additional Equipment:   Intra-op Plan:   Post-operative Plan:   Informed Consent: I have reviewed the patients History and Physical, chart, labs and discussed the procedure including the risks, benefits and alternatives for the proposed anesthesia with the patient or authorized representative who has indicated his/her understanding and acceptance.     Dental Advisory Given  Plan Discussed with: Anesthesiologist, CRNA and Surgeon  Anesthesia Plan Comments:          Anesthesia Quick Evaluation

## 2024-06-03 NOTE — Transfer of Care (Signed)
 Immediate Anesthesia Transfer of Care Note  Patient: [name] [name]  Procedure(s) Performed: ARTHROPLASTY, KNEE, TOTAL, USING IMAGELESS COMPUTER-ASSISTED NAVIGATION (Right: Knee)  Patient Location: PACU  Anesthesia Type:General  Level of Consciousness: awake and patient cooperative  Airway & Oxygen Therapy: Patient Spontanous Breathing  Post-op Assessment: Report given to RN and Post -op Vital signs reviewed and stable  Post vital signs: stable  Last Vitals:  Vitals Value Taken Time  BP 98/53 06/03/24 16:01  Temp    Pulse 88 06/03/24 16:03  Resp 14 06/03/24 16:03  SpO2 95 % 06/03/24 16:03  Vitals shown include unfiled device data.  Last Pain:  Vitals:   06/03/24 1043  TempSrc: Temporal  PainSc: 0-No pain         Complications: There were no known notable events for this encounter.

## 2024-06-03 NOTE — Op Note (Signed)
 OPERATIVE NOTE  DATE OF SURGERY:  06/03/2024  PATIENT NAME:  KEYON WINNICK   DOB: 07-12-1956  MRN: [id number]  PRE-OPERATIVE DIAGNOSIS: Degenerative arthrosis of the right knee, primary  POST-OPERATIVE DIAGNOSIS:  Same  PROCEDURE:  Right total knee arthroplasty using computer-assisted navigation  SURGEON:  Lynwood SHAUNNA Mardee Mickey. M.D.  ASSISTANT:  Sidra Koyanagi, PA-C (present and scrubbed throughout the case, critical for assistance with exposure, retraction, instrumentation, and closure)  ANESTHESIA: spinal  ESTIMATED BLOOD LOSS: 75 mL  FLUIDS REPLACED: 500 mL of crystalloid  TOURNIQUET TIME: 72 minutes  DRAINS: 2 medium Hemovac drains  SOFT TISSUE RELEASES: Anterior cruciate ligament, posterior cruciate ligament, deep medial collateral ligament, patellofemoral ligament  IMPLANTS UTILIZED: DePuy Attune size 6n posterior stabilized femoral component (cemented), size 5 rotating platform tibial component (cemented), 35 mm medialized dome patella (cemented), and a 5 mm stabilized rotating platform polyethylene insert.  INDICATIONS FOR SURGERY: Majorie D Claw is a 68 y.o. year old female with a long history of progressive knee pain. X-rays demonstrated severe degenerative changes in tricompartmental fashion. The patient had not seen any significant improvement despite conservative nonsurgical intervention. After discussion of the risks and benefits of surgical intervention, the patient expressed understanding of the risks benefits and agree with plans for total knee arthroplasty.   The risks, benefits, and alternatives were discussed at length including but not limited to the risks of infection, bleeding, nerve injury, stiffness, blood clots, the need for revision surgery, cardiopulmonary complications, among others, and they were willing to proceed.  PROCEDURE IN DETAIL: The patient was brought into the operating room and, after adequate spinal anesthesia was achieved, a tourniquet was  placed on the patient's upper thigh. The patient's knee and leg were cleaned and prepped with alcohol and DuraPrep and draped in the usual sterile fashion. A timeout was performed as per usual protocol. The lower extremity was exsanguinated using an Esmarch, and the tourniquet was inflated to 300 mmHg. An anterior longitudinal incision was made followed by a standard mid vastus approach. The deep fibers of the medial collateral ligament were elevated in a subperiosteal fashion off of the medial flare of the tibia so as to maintain a continuous soft tissue sleeve. The patella was subluxed laterally and the patellofemoral ligament was incised. Inspection of the knee demonstrated severe degenerative changes with full-thickness loss of articular cartilage. Osteophytes were debrided using a rongeur. Anterior and posterior cruciate ligaments were excised. Two 4.0 mm Schanz pins were inserted in the femur and into the tibia for attachment of the array of trackers used for computer-assisted navigation. Hip center was identified using a circumduction technique. Distal landmarks were mapped using the computer. The distal femur and proximal tibia were mapped using the computer. The distal femoral cutting guide was positioned using computer-assisted navigation so as to achieve a 5 distal valgus cut. The femur was sized and it was felt that a size 6n femoral component was appropriate. A size 6 femoral cutting guide was positioned and the anterior cut was performed and verified using the computer. This was followed by completion of the posterior and chamfer cuts. Femoral cutting guide for the central box was then positioned in the center box cut was performed.  Attention was then directed to the proximal tibia. Medial and lateral menisci were excised. The extramedullary tibial cutting guide was positioned using computer-assisted navigation so as to achieve a 0 varus-valgus alignment and 3 posterior slope. The cut was  performed and verified using the computer. The proximal tibia  was sized and it was felt that a size 5 tibial tray was appropriate. Tibial and femoral trials were inserted followed by insertion of a 5 mm polyethylene insert. This allowed for excellent mediolateral soft tissue balancing both in flexion and in full extension. Finally, the patella was cut and prepared so as to accommodate a 35 mm medialized dome patella. A patella trial was placed and the knee was placed through a range of motion with excellent patellar tracking appreciated. The femoral trial was removed after debridement of posterior osteophytes. The central post-hole for the tibial component was reamed followed by insertion of a keel punch. Tibial trials were then removed. Cut surfaces of bone were irrigated with copious amounts of normal saline using pulsatile lavage and then suctioned dry. Polymethylmethacrylate cement with gentamicin was prepared in the usual fashion using a vacuum mixer. Cement was applied to the cut surface of the proximal tibia as well as along the undersurface of a size 5 rotating platform tibial component. Tibial component was positioned and impacted into place. Excess cement was removed using Personal assistant. Cement was then applied to the cut surfaces of the femur as well as along the posterior flanges of the size 6n femoral component. The femoral component was positioned and impacted into place. Excess cement was removed using Personal assistant. A 5 mm polyethylene trial was inserted and the knee was brought into full extension with steady axial compression applied. Finally, cement was applied to the backside of a 35 mm medialized dome patella and the patellar component was positioned and patellar clamp applied. Excess cement was removed using Personal assistant. After adequate curing of the cement, the tourniquet was deflated after a total tourniquet time of 72 minutes. Hemostasis was achieved using electrocautery. The knee was  irrigated with copious amounts of normal saline using pulsatile lavage followed by 450 ml of Surgiphor and then suctioned dry. 20 mL of 1.3% Exparel and 60 mL of 0.25% Marcaine in 40 mL of normal saline was injected along the posterior capsule, medial and lateral gutters, and along the arthrotomy site. A 5 mm stabilized rotating platform polyethylene insert was inserted and the knee was placed through a range of motion with excellent mediolateral soft tissue balancing appreciated and excellent patellar tracking noted. 2 medium drains were placed in the wound bed and brought out through separate stab incisions. The medial parapatellar portion of the incision was reapproximated using interrupted sutures of #1 Vicryl. Subcutaneous tissue was approximated in layers using first #0 Vicryl followed #2-0 Vicryl. The skin was approximated with skin staples. A sterile dressing was applied.  The patient tolerated the procedure well and was transported to the recovery room in stable condition.    Jewell Haught P. Cloris Flippo, Jr., M.D.

## 2024-06-04 ENCOUNTER — Observation Stay

## 2024-06-04 ENCOUNTER — Encounter: Payer: Self-pay | Admitting: Orthopedic Surgery

## 2024-06-04 ENCOUNTER — Other Ambulatory Visit: Payer: Self-pay

## 2024-06-04 DIAGNOSIS — M1711 Unilateral primary osteoarthritis, right knee: Secondary | ICD-10-CM | POA: Diagnosis not present

## 2024-06-04 MED ORDER — OXYCODONE HCL 5 MG PO TABS
5.0000 mg | ORAL_TABLET | ORAL | 0 refills | Status: DC | PRN
  Filled 2024-06-04: qty 30, 5d supply, fill #0

## 2024-06-04 MED ORDER — CELECOXIB 200 MG PO CAPS
200.0000 mg | ORAL_CAPSULE | Freq: Two times a day (BID) | ORAL | 1 refills | Status: AC
  Filled 2024-06-04: qty 60, 30d supply, fill #0

## 2024-06-04 MED ORDER — CEFAZOLIN SODIUM-DEXTROSE 2-4 GM/100ML-% IV SOLN
INTRAVENOUS | Status: AC
Start: 2024-06-04 — End: 2024-06-04
  Filled 2024-06-04: qty 100

## 2024-06-04 MED ORDER — TRAMADOL HCL 50 MG PO TABS
50.0000 mg | ORAL_TABLET | Freq: Four times a day (QID) | ORAL | 0 refills | Status: DC | PRN
  Filled 2024-06-04: qty 30, 8d supply, fill #0

## 2024-06-04 MED ORDER — ASPIRIN 81 MG PO CHEW: 81.0000 mg | CHEWABLE_TABLET | Freq: Two times a day (BID) | ORAL | Status: AC

## 2024-06-04 NOTE — Evaluation (Signed)
 Physical Therapy Evaluation Patient Details Name: [name] [name] MRN: [id number] DOB: 1956-01-07 Today's Date: 06/04/2024  History of Present Illness  Patient is a 68 year old female with degenerative arthrosis of the right knee s/p right total knee arthroplasty.  Clinical Impression  Patient is agreeable to PT evaluation. She lives alone but has friends that will check on her after this hospital stay. She is ambulatory with a rolling walker at baseline and reports only one recent fall (has significant bruising on her face).  No physical assistance required with mobility today. She ambulating in hallway with rolling walker with reciprocal gait pattern, mildly decreased stance time on the right. Encouraged continued use of rolling walker for safety. Stair training completed. Patient eager to go home soon. Mobility is adequate for discharge home with support from friends. PT will follow up if patient does not discharge as anticipated.     If plan is discharge home, recommend the following: Assist for transportation   Can travel by private vehicle        Equipment Recommendations None recommended by PT (patient declined [facility])  Recommendations for Other Services       Functional Status Assessment Patient has had a recent decline in their functional status and demonstrates the ability to make significant improvements in function in a reasonable and predictable amount of time.     Precautions / Restrictions Precautions Precautions: Knee Precaution Booklet Issued: Yes (comment) Recall of Precautions/Restrictions: Intact Restrictions Weight Bearing Restrictions Per Provider Order: Yes RLE Weight Bearing Per Provider Order: Weight bearing as tolerated      Mobility  Bed Mobility Overal bed mobility: Modified Independent                  Transfers Overall transfer level: Needs assistance Equipment used: Rolling walker (2 wheels) Transfers: Sit to/from Stand Sit to Stand:  Supervision           General transfer comment: safe hand placement used with occasional cues    Ambulation/Gait Ambulation/Gait assistance: Supervision Gait Distance (Feet): 120 Feet Assistive device: Rolling walker (2 wheels) Gait Pattern/deviations: Step-through pattern, Decreased stance time - right Gait velocity: decreased     General Gait Details: reciprocal gait with mildly decreased stance time RLE. mild increased pain reported with ambulation. encourage continued use of rolling walker for safety and fall prevention  Stairs Stairs: Yes Stairs assistance: Supervision Stair Management: One rail Right, One rail Left, Step to pattern, Forwards Number of Stairs: 4 General stair comments: using right side going up and left side going down, both hands on the same rail each time.  Wheelchair Mobility     Tilt Bed    Modified Rankin (Stroke Patients Only)       Balance Overall balance assessment: Needs assistance Sitting-balance support: Feet supported Sitting balance-Leahy Scale: Good     Standing balance support: No upper extremity supported Standing balance-Leahy Scale: Fair Standing balance comment: patient able to stand briefly without UE support for rolling walker adjusment for height without loss of balance                             Pertinent Vitals/Pain Pain Assessment Pain Assessment: 0-10 Pain Score: 3  Pain Location: R knee Pain Descriptors / Indicators: Discomfort Pain Intervention(s): Monitored during session, Limited activity within patient's tolerance, Repositioned, Premedicated before session    Home Living Family/patient expects to be discharged to:: Private residence Living Arrangements: Alone Available Help at  Discharge: Friend(s) Type of Home: Apartment Home Access: Stairs to enter Entrance Stairs-Rails: Right;Left (cannot reach both) Entrance Stairs-Number of Steps: 4   Home Layout: One level Home Equipment: Clinical biochemist (2 wheels)      Prior Function Prior Level of Function : Independent/Modified Independent;Driving             Mobility Comments: using rolling walker. she fell recently and hit her face, other than than no falls reported       Extremity/Trunk Assessment   Upper Extremity Assessment Upper Extremity Assessment: Defer to OT evaluation    Lower Extremity Assessment Lower Extremity Assessment: RLE deficits/detail RLE Deficits / Details: SLR independently. knee flexion 85 degrees       Communication   Communication Communication: No apparent difficulties    Cognition Arousal: Alert Behavior During Therapy: WFL for tasks assessed/performed   PT - Cognitive impairments: No apparent impairments                         Following commands: Intact       Cueing Cueing Techniques: Verbal cues     General Comments      Exercises Total Joint Exercises Goniometric ROM: right knee 85 degrees in sitting   Assessment/Plan    PT Assessment Patient needs continued PT services  PT Problem List Decreased strength;Decreased range of motion;Decreased activity tolerance;Decreased balance;Decreased mobility;Decreased knowledge of use of DME       PT Treatment Interventions DME instruction;Gait training;Stair training;Functional mobility training;Therapeutic activities;Therapeutic exercise;Balance training;Neuromuscular re-education;Patient/family education    PT Goals (Current goals can be found in the Care Plan section)  Acute Rehab PT Goals Patient Stated Goal: to go home PT Goal Formulation: With patient Time For Goal Achievement: 06/18/24 Potential to Achieve Goals: Good    Frequency BID     Co-evaluation               AM-PAC PT 6 Clicks Mobility  Outcome Measure Help needed turning from your back to your side while in a flat bed without using bedrails?: None Help needed moving from lying on your back to sitting on the side of a flat bed  without using bedrails?: A Little Help needed moving to and from a bed to a chair (including a wheelchair)?: A Little Help needed standing up from a chair using your arms (e.g., wheelchair or bedside chair)?: A Little Help needed to walk in hospital room?: A Little Help needed climbing 3-5 steps with a railing? : A Little 6 Click Score: 19    End of Session Equipment Utilized During Treatment: Gait belt Activity Tolerance: Patient tolerated treatment well Patient left: in chair (transport chair with staff) Nurse Communication: Mobility status PT Visit Diagnosis: Difficulty in walking, not elsewhere classified (R26.2);Other abnormalities of gait and mobility (R26.89)    Time: 9098-9082 PT Time Calculation (min) (ACUTE ONLY): 16 min   Charges:   PT Evaluation $PT Eval Low Complexity: 1 Low   PT General Charges $$ ACUTE PT VISIT: 1 Visit         Randine Essex, PT, MPT   Randine LULLA Essex 06/04/2024, 11:40 AM

## 2024-06-04 NOTE — Progress Notes (Signed)
 ORTHOPAEDICS: Patient is doing well with regard to the right knee.  She sustained a fall on Sunday and sustained blunt trauma to the right side of her face. She did not go to the ED. She denied any loss of consciousness, headache, or visual changes. She was noted to have periorbital ecchymosis and swelling yesterday. She requested we proceed with knee surgery and Anesthesiology did not see any contraindications.  Still with right periorbital ecchymosis and soft tissue swelling. Generalized tenderness to the periorbital region. EOM is intact. Cervical spine is nontender and demonstrates good range of motion.  I will obtain a maxillofacial CT to evaluate for any bony injury.   She may continue with routine postop rehabilitation of the right total knee arthroplasty.  Antonyo Hinderer P. Floraine Buechler, Jr. M.D.

## 2024-06-04 NOTE — Plan of Care (Signed)

## 2024-06-04 NOTE — Anesthesia Postprocedure Evaluation (Signed)
 Anesthesia Post Note  Patient: [name] [name]  Procedure(s) Performed: ARTHROPLASTY, KNEE, TOTAL, USING IMAGELESS COMPUTER-ASSISTED NAVIGATION (Right: Knee)  Patient location during evaluation: Short Stay Anesthesia Type: Spinal Level of consciousness: awake and alert and oriented Pain management: satisfactory to patient Vital Signs Assessment: post-procedure vital signs reviewed and stable Respiratory status: spontaneous breathing Cardiovascular status: stable Postop Assessment: patient able to bend at knees, no apparent nausea or vomiting, adequate PO intake and able to ambulate Anesthetic complications: no Comments: Has been able to void.   There were no known notable events for this encounter.   Last Vitals:  Vitals:   06/03/24 2359 06/04/24 0455  BP: 104/61 (P) 108/86  Pulse: 88 (P) 93  Resp: 16 (P) 15  Temp: 36.6 C (P) 36.7 C  SpO2: 94% (P) 96%    Last Pain:  Vitals:   06/04/24 0455  TempSrc: (P) Temporal  PainSc:                  Roselee Hint

## 2024-06-04 NOTE — Discharge Summary (Signed)
 Physician Discharge Summary  Subjective: 1 Day Post-Op Procedure(s) (LRB): ARTHROPLASTY, KNEE, TOTAL, USING IMAGELESS COMPUTER-ASSISTED NAVIGATION (Right) Patient reports pain as mild.   Patient seen in rounds with Dr. Mardee. Patient is well, and has had no acute complaints or problems Patient is ready to go home  Physician Discharge Summary  Patient ID: [name] [name] MRN: [id number] DOB/AGE: 1956-02-07 68 y.o.  Admit date: 06/03/2024 Discharge date: 06/04/2024  Admission Diagnoses:  Discharge Diagnoses:  Principal Problem:   History of total knee arthroplasty, right   Discharged Condition: good  Hospital Course: Patient presented to the hospital on 06/03/2024 for an elective right total knee arthroplasty performed by Dr. Mardee. Patient was given 1g of TXA and 2g of Ancef prior to the procedure. she tolerated the procedure well without any complications. See procedural note below for details. Postoperatively, the patient did very well. she was able to pass PT protocols on post-op day one without any issues. JP drain was removed without any difficulty and was intact. she was able to void her bladder without any difficulty. Physical exam was unremarkable regarding her knee. However, patient had sustained a fall a day prior to her surgical date and hit her right side of her face, CT was ordered to rule out any acute fracture. She denies any SOB, CP, N/V, fevers or chills. Vital signs are stable. Patient is stable to discharge home.  PROCEDURE:  Right total knee arthroplasty using computer-assisted navigation   SURGEON:  Lynwood SHAUNNA Mardee Mickey. M.D.   ASSISTANT:  Sidra Koyanagi, PA-C (present and scrubbed throughout the case, critical for assistance with exposure, retraction, instrumentation, and closure)   ANESTHESIA: spinal   ESTIMATED BLOOD LOSS: 75 mL   FLUIDS REPLACED: 500 mL of crystalloid   TOURNIQUET TIME: 72 minutes   DRAINS: 2 medium Hemovac drains   SOFT TISSUE  RELEASES: Anterior cruciate ligament, posterior cruciate ligament, deep medial collateral ligament, patellofemoral ligament   IMPLANTS UTILIZED: DePuy Attune size 6n posterior stabilized femoral component (cemented), size 5 rotating platform tibial component (cemented), 35 mm medialized dome patella (cemented), and a 5 mm stabilized rotating platform polyethylene insert.  Treatments: none  Discharge Exam: Blood pressure (P) 108/86, pulse (P) 93, temperature (P) 98 F (36.7 C), temperature source (P) Temporal, resp. rate (P) 15, height 5' 5 (1.651 m), weight 78.9 kg, SpO2 (P) 96%.   Disposition: home   Allergies as of 06/04/2024       Reactions   Sonata [zaleplon]    Took with another medication with this and it caused hallucinations.   Carbidopa-levodopa Nausea Only        Medication List     TAKE these medications    acetaminophen  650 MG CR tablet Commonly known as: TYLENOL  Take 1,300 mg by mouth every 8 (eight) hours as needed for pain.   amitriptyline 50 MG tablet Commonly known as: ELAVIL Take 150 mg by mouth at bedtime.   ARIPiprazole 5 MG tablet Commonly known as: ABILIFY Take 5 mg by mouth See admin instructions. Take with 10 mg for a total of 15 mg daily   ARIPiprazole 10 MG tablet Commonly known as: ABILIFY Take 10 mg by mouth See admin instructions. Take with 5 mg for a total of 15 mg daily-AM   aspirin 81 MG chewable tablet Chew 1 tablet (81 mg total) by mouth 2 (two) times daily.   buPROPion 150 MG 12 hr tablet Commonly known as: WELLBUTRIN SR Take 150-300 mg by mouth See admin instructions. Take 300  mg in the morning and 150 mg at bedtime   busPIRone 10 MG tablet Commonly known as: BUSPAR Take 10 mg by mouth 3 (three) times daily.   CALCIUM CARBONATE PO Take 500 mg by mouth daily as needed (Heartburn/Acid reflux).   celecoxib  200 MG capsule Commonly known as: CELEBREX  Take 1 capsule (200 mg total) by mouth 2 (two) times daily.   donepezil  10 MG tablet Commonly known as: ARICEPT Take 10 mg by mouth at bedtime.   Dry Eye Relief Drops 0.2-0.2-1 % Soln Generic drug: Glycerin-Hypromellose-PEG 400 Place 1 drop into both eyes daily as needed (Dry eye). bausch and lomb   esomeprazole  20 MG capsule Commonly known as: NEXIUM  Take 20 mg by mouth daily as needed (Acid reflux).   [employer] Pen Needles 31G X 6 MM Misc Generic drug: Insulin Pen Needle as directed.   levothyroxine 112 MCG tablet Commonly known as: SYNTHROID Take 112 mcg by mouth daily before breakfast.   memantine 10 MG tablet Commonly known as: NAMENDA Take 10 mg by mouth See admin instructions. Take with 5 mg for a total of 15 mg twice a day   memantine 5 MG tablet Commonly known as: NAMENDA Take 5 mg by mouth See admin instructions. Take with 10 mg for a total of 15 mg twice a day   Multi-Vitamin tablet Take 1 tablet by mouth daily. Woman 50 +   Myrbetriq 25 MG Tb24 tablet Generic drug: mirabegron ER Take 25 mg by mouth daily after lunch.   nystatin  cream Commonly known as: MYCOSTATIN  Apply to affected areas rash in skin folds What changed:  how much to take how to take this when to take this reasons to take this additional instructions   oxyCODONE  5 MG immediate release tablet Commonly known as: Oxy IR/ROXICODONE  Take 1 tablet (5 mg total) by mouth every 4 (four) hours as needed for moderate pain (pain score 4-6) (pain score 4-6).   traMADol 50 MG tablet Commonly known as: ULTRAM Take 1 tablet (50 mg total) by mouth every 6 (six) hours as needed. What changed: how much to take   venlafaxine XR 150 MG 24 hr capsule Commonly known as: EFFEXOR-XR Take 300 mg by mouth daily with breakfast.               Durable Medical Equipment  (From admission, onward)           Start     Ordered   06/03/24 1741  DME Walker rolling  Once       Question:  Patient needs a walker to treat with the following condition  Answer:  Total knee  replacement status   06/03/24 1740   06/03/24 1741  DME Bedside commode  Once       Comments: Patient is not able to walk the distance required to go the bathroom, or he/she is unable to safely negotiate stairs required to access the bathroom.  A 3in1 BSC will alleviate this problem  Question:  Patient needs a bedside commode to treat with the following condition  Answer:  Total knee replacement status   06/03/24 1740            Follow-up Information     Charlene Debby BROCKS, PA-C Follow up on 06/18/2024.   Specialties: Orthopedic Surgery, Emergency Medicine Why: at 2:00pm Contact information: [address] [place] [address] [phone]         Mardee Lynwood SQUIBB, MD Follow up on 07/18/2024.   Specialty:  Orthopedic Surgery Why: at 1:45pm Contact information: [address] [facility] [place] [address] [phone]                 Signed: Sidra Koyanagi 06/04/2024, 8:11 AM   Objective: Vital signs in last 24 hours: Temp:  [97.8 F (36.6 C)-99.1 F (37.3 C)] (P) 98 F (36.7 C) (10/21 0455) Pulse Rate:  [80-94] (P) 93 (10/21 0455) Resp:  [13-20] (P) 15 (10/21 0455) BP: (98-143)/(53-88) (P) 108/86 (10/21 0455) SpO2:  [91 %-99 %] (P) 96 % (10/21 0455) Weight:  [78.9 kg] 78.9 kg (10/20 1043)  Intake/Output from previous day:  Intake/Output Summary (Last 24 hours) at 06/04/2024 0811 Last data filed at 06/04/2024 0452 Gross per 24 hour  Intake 1915.28 ml  Output 1055 ml  Net 860.28 ml    Intake/Output this shift: No intake/output data recorded.  Labs: No results for input(s): HGB in the last 72 hours. No results for input(s): WBC, RBC, HCT, PLT in the last 72 hours. No results for input(s): NA, K, CL, CO2, BUN, CREATININE, GLUCOSE, CALCIUM in the last 72 hours. No results for input(s): LABPT, INR in the last 72 hours.  EXAM: General - Patient is Alert, Appropriate, and Oriented Extremity -  Neurologically intact Neurovascular intact Sensation intact distally Intact pulses distally Dorsiflexion/Plantar flexion intact No cellulitis present Compartment soft Dressing - dressing C/D/I and no drainage Motor Function - intact, moving foot and toes well on exam. JP Drain pulled without difficulty. Intact  Imaging:  FINDINGS:   BONES: No acute facial fracture. No mandibular dislocation. No suspicious bone lesion. Edentulous. Disc and facet degeneration in the included portion of the upper cervical spine including asymmetrically advanced left facet arthrosis at C2-C3 and C3-C4 with grade 1 anterolisthesis at the latter.   ORBITS: Bilateral cataract extraction. Right periorbital hematoma with diffuse soft tissue swelling/contusion extending laterally and inferiorly in the right facial soft tissues. No retrobulbar hematoma. No inflammatory change.   SINUSES AND MASTOIDS: No acute abnormality.   IMPRESSION: 1. No acute facial fracture. 2. Right periorbital hematoma.  Assessment/Plan: 1 Day Post-Op Procedure(s) (LRB): ARTHROPLASTY, KNEE, TOTAL, USING IMAGELESS COMPUTER-ASSISTED NAVIGATION (Right) Procedure(s) (LRB): ARTHROPLASTY, KNEE, TOTAL, USING IMAGELESS COMPUTER-ASSISTED NAVIGATION (Right) Past Medical History:  Diagnosis Date   Actinic keratosis    Arthritis    fingers   Basal cell carcinoma 12/13/2022   left upper abdomen, EDC   Carpal tunnel syndrome of left wrist    Cigarette smoker    Depression    Dyspnea    GERD (gastroesophageal reflux disease)    Grade I diastolic dysfunction    Hepatitis C 1985   took treatment   History of methicillin resistant staphylococcus aureus (MRSA)    Hypothyroidism    Insomnia    Memory loss    Osteoarthritis of right knee    Osteoporosis    PONV (postoperative nausea and vomiting)    n/v with tonsillectomy   RBBB (right bundle branch block)    SCC (squamous cell carcinoma) 03/06/2023   left lower lateral leg,  clear with bx on 06/20/23, Mohs cancelled   Squamous cell carcinoma of skin 12/27/2021   Left pretibial - EDC   Squamous cell carcinoma of skin 04/03/2024   L upper knee, EDC   Tremor    Wears dentures    full upper, partial lower   Principal Problem:   History of total knee arthroplasty, right  Estimated body mass index is 28.96 kg/m as calculated from the  following:   Height as of this encounter: 5' 5 (1.651 m).   Weight as of this encounter: 78.9 kg.  Patient will continue to work with physical therapy to pass postoperative PT protocols, ROM and strengthening   Discussed with the patient continuing to utilize Polar Care   Patient will use bone foam in 20-30 minute intervals   Patient will wear TED hose bilaterally to help prevent DVT and clot formation   Discussed the Aquacel bandage.  This bandage will stay in place 7 days postoperatively.  Can be replaced with honeycomb bandages that will be sent home with the patient   Discussed sending the patient home with tramadol and oxycodone  for as needed pain management.  Patient will also be sent home with Celebrex  to help with swelling and inflammation.  Patient will take an 81 mg aspirin twice daily for DVT prophylaxis   JP drain removed without difficulty, intact  CT of head negative for any acute fracture   Weight-Bearing as tolerated to right leg   Patient will follow-up with [facility] orth[facility]ics in 2 weeks for staple removal and reevaluation  Diet - Regular diet Follow up - in 2 weeks Activity - WBAT Disposition - Home Condition Upon Discharge - Good DVT Prophylaxis - Aspirin and TED hose  Fonda CHARLENA Koyanagi, PA-C Orthopaedic Surgery 06/04/2024, 8:11 AM

## 2024-06-04 NOTE — Plan of Care (Signed)
   Problem: Activity: Goal: Risk for activity intolerance will decrease Outcome: Progressing   Problem: Coping: Goal: Level of anxiety will decrease Outcome: Progressing   Problem: Pain Managment: Goal: General experience of comfort will improve and/or be controlled Outcome: Progressing

## 2024-06-04 NOTE — Progress Notes (Addendum)
 Subjective: 1 Day Post-Op Procedure(s) (LRB): ARTHROPLASTY, KNEE, TOTAL, USING IMAGELESS COMPUTER-ASSISTED NAVIGATION (Right) Patient reports pain as mild.   Patient seen in rounds with Dr. Mardee. Patient is well, and has had no acute complaints or problems Denies any CP, SOB, N/V, fevers or chills We will start therapy today.  Plan is to go Home after hospital stay.  Objective: Vital signs in last 24 hours: Temp:  [97.8 F (36.6 C)-99.1 F (37.3 C)] (P) 98 F (36.7 C) (10/21 0455) Pulse Rate:  [80-94] (P) 93 (10/21 0455) Resp:  [13-20] (P) 15 (10/21 0455) BP: (98-143)/(53-88) (P) 108/86 (10/21 0455) SpO2:  [91 %-99 %] (P) 96 % (10/21 0455) Weight:  [78.9 kg] 78.9 kg (10/20 1043)  Intake/Output from previous day:  Intake/Output Summary (Last 24 hours) at 06/04/2024 0809 Last data filed at 06/04/2024 0452 Gross per 24 hour  Intake 1915.28 ml  Output 1055 ml  Net 860.28 ml    Intake/Output this shift: No intake/output data recorded.  Labs: No results for input(s): HGB in the last 72 hours. No results for input(s): WBC, RBC, HCT, PLT in the last 72 hours. No results for input(s): NA, K, CL, CO2, BUN, CREATININE, GLUCOSE, CALCIUM in the last 72 hours. No results for input(s): LABPT, INR in the last 72 hours.  EXAM General - Patient is Alert, Appropriate, and Oriented Extremity - Neurologically intact Neurovascular intact Sensation intact distally Intact pulses distally Dorsiflexion/Plantar flexion intact No cellulitis present Compartment soft Dressing - dressing with moderate drainage at inferior portion. Aquacell removed, area cleaned with chloroprep and sterile gauze, new aquacell applied Motor Function - intact, moving foot and toes well on exam. JP Drain remains in place  Past Medical History:  Diagnosis Date   Actinic keratosis    Arthritis    fingers   Basal cell carcinoma 12/13/2022   left upper abdomen, ED[facility]   Carpal tunnel  syndrome of left wrist    Cigarette smoker    Depression    Dyspnea    GERD (gastroesophageal reflux disease)    Grade I diastolic dysfunction    Hepatitis C 1985   took treatment   History of methicillin resistant staphylococcus aureus (MRSA)    Hypothyroidism    Insomnia    Memory loss    Osteoarthritis of right knee    Osteoporosis    PONV (postoperative nausea and vomiting)    n/v with tonsillectomy   RBBB (right bundle branch block)    SCC (squamous cell carcinoma) 03/06/2023   left lower lateral leg, clear with bx on 06/20/23, Mohs cancelled   Squamous cell carcinoma of skin 12/27/2021   Left pretibial - ED[facility]   Squamous cell carcinoma of skin 04/03/2024   L upper knee, [facility]   Tremor    Wears dentures    full upper, partial lower    Assessment/Plan: 1 Day Post-Op Procedure(s) (LRB): ARTHROPLASTY, KNEE, TOTAL, USING IMAGELESS COMPUTER-ASSISTED NAVIGATION (Right) Principal Problem:   History of total knee arthroplasty, right  Estimated body mass index is 28.96 kg/m as calculated from the following:   Height as of this encounter: 5' 5 (1.651 m).   Weight as of this encounter: 78.9 kg. Advance diet Up with therapy  Patient will continue to work with physical therapy to pass postoperative PT protocols, ROM and strengthening  Discussed with the patient continuing to utilize Polar Care  Patient will use bone foam in 20-30 minute intervals  Patient will wear TED hose bilaterally to help prevent DVT and clot  formation  Discussed the Aquacel bandage.  This bandage will stay in place 7 days postoperatively.  Can be replaced with honeycomb bandages that will be sent home with the patient  Wound cleaned. New Aquacell applied  Discussed sending the patient home with tramadol and oxycodone  for as needed pain management.  Patient will also be sent home with Celebrex  to help with swelling and inflammation.  Patient will take an 81 mg aspirin twice daily for DVT  prophylaxis  JP drain remains in place  Weight-Bearing as tolerated to right leg  Head CT ordered, pending results  Patient will follow-up with [facility] orthopedics in 2 weeks for staple removal and reevaluation  Fonda Koyanagi, PA-C [facility] 06/04/2024, 8:09 AM

## 2024-06-04 NOTE — Progress Notes (Signed)
 DISCHARGE NOTE:    Pt dc with IV removed and dc instructions given. Pt received medications delivered to hospital room. Pt voices no questions or concerns at this time. Pt wheeled down to medical mall entrance by staff. Pt has both TED hose on and in place. Pt's friend provided transportation.

## 2024-06-04 NOTE — Evaluation (Signed)
 Occupational Therapy Evaluation Patient Details Name: [name] [name] MRN: [id number] DOB: Jun 06, 1956 Today's Date: 06/04/2024   History of Present Illness   Patient is a 68 year old female with degenerative arthrosis of the right knee s/p right total knee arthroplasty.     Clinical Impressions Pt seen for OT evaluation this date, POD#1 from above surgery. Pt was independent in all ADL prior to surgery and is eager to return to PLOF with less pain and improved safety and independence. Pt currently requires minimal assist for LB dressing and bathing while in seated position due to pain and limited AROM of R knee. Pt instructed in polar care mgt, falls prevention strategies, pet care considerations, home/routines modifications, DME/AE for LB bathing and dressing tasks, and compression stocking mgt. Pt would benefit from skilled OT services including additional instruction in dressing techniques with or without assistive devices for dressing and bathing skills to support recall and carryover prior to discharge and ultimately to maximize safety, independence, and minimize falls risk and caregiver burden.      If plan is discharge home, recommend the following:   A little help with walking and/or transfers;A little help with bathing/dressing/bathroom;Assistance with cooking/housework;Assist for transportation;Help with stairs or ramp for entrance     Functional Status Assessment   Patient has had a recent decline in their functional status and demonstrates the ability to make significant improvements in function in a reasonable and predictable amount of time.     Equipment Recommendations   BSC/3in1     Recommendations for Other Services         Precautions/Restrictions   Precautions Precautions: Knee Precaution Booklet Issued: Yes (comment) Recall of Precautions/Restrictions: Intact Restrictions Weight Bearing Restrictions Per Provider Order: Yes RLE Weight Bearing Per  Provider Order: Weight bearing as tolerated     Mobility Bed Mobility Overal bed mobility: Modified Independent                  Transfers                          Balance                                           ADL either performed or assessed with clinical judgement   ADL Overall ADL's : Needs assistance/impaired                                       General ADL Comments: Pt currently requires MIN A for LB ADL tasks 2/2 expected post-op decr in ROM, strength, and R knee pain     Vision         Perception         Praxis         Pertinent Vitals/Pain Pain Assessment Pain Assessment: 0-10 Pain Score: 5  Pain Location: R knee Pain Descriptors / Indicators: Discomfort Pain Intervention(s): Monitored during session, Repositioned, Patient requesting pain meds-RN notified, Ice applied     Extremity/Trunk Assessment Upper Extremity Assessment Upper Extremity Assessment: Overall WFL for tasks assessed   Lower Extremity Assessment Lower Extremity Assessment: Defer to PT evaluation;RLE deficits/detail RLE Deficits / Details: SLR independently. knee flexion 85 degrees       Communication Communication Communication: No apparent difficulties  Cognition Arousal: Alert Behavior During Therapy: WFL for tasks assessed/performed Cognition: No apparent impairments                               Following commands: Intact       Cueing  General Comments   Cueing Techniques: Verbal cues      Exercises Other Exercises Other Exercises: Pt instructed in home/routines modifications, pet care considerations, falls prevention, AE/DME, compression stocking mgt, and polar care mgt. Handout provided to support recall and carryover.   Shoulder Instructions      Home Living Family/patient expects to be discharged to:: Private residence Living Arrangements: Alone Available Help at Discharge:  Friend(s) Type of Home: Apartment Home Access: Stairs to enter [employer] of Steps: 4 Entrance Stairs-Rails: Right;Left (cannot reach both) Home Layout: One level     Bathroom Shower/Tub: Chief Strategy Officer: Standard     Home Equipment: Agricultural consultant (2 wheels)          Prior Functioning/Environment Prior Level of Function : Independent/Modified Independent;Driving;History of Falls (last six months)             Mobility Comments: using rolling walker. she fell recently and hit her face, other than than no falls reported ADLs Comments: indep, has a cat    OT Problem List: Decreased strength;Pain;Decreased range of motion;Impaired balance (sitting and/or standing);Decreased knowledge of use of DME or AE   OT Treatment/Interventions: Self-care/ADL training;Therapeutic exercise;Therapeutic activities;DME and/or AE instruction;Patient/family education;Balance training      OT Goals(Current goals can be found in the care plan section)   Acute Rehab OT Goals Patient Stated Goal: go home OT Goal Formulation: With patient Time For Goal Achievement: 06/18/24 Potential to Achieve Goals: Good ADL Goals Pt Will Perform Lower Body Dressing: with modified independence;with adaptive equipment;sit to/from stand Pt Will Transfer to Toilet: with modified independence;ambulating (LRAD) Pt Will Perform Toileting - Clothing Manipulation and hygiene: with modified independence;sitting/lateral leans Additional ADL Goal #1: Pt will independently instruct family/caregivers in polar care mgt. Additional ADL Goal #2: Pt will independently instruct family/caregivers in compression stocking mgt.   OT Frequency:  Min 2X/week    Co-evaluation              AM-PAC OT 6 Clicks Daily Activity     Outcome Measure Help from another person eating meals?: None Help from another person taking care of personal grooming?: None Help from another person toileting,  which includes using toliet, bedpan, or urinal?: A Little Help from another person bathing (including washing, rinsing, drying)?: A Little Help from another person to put on and taking off regular upper body clothing?: None Help from another person to put on and taking off regular lower body clothing?: A Little 6 Click Score: 21   End of Session Nurse Communication: Mobility status;Patient requests pain meds  Activity Tolerance: Patient tolerated treatment well Patient left: in bed;with call bell/phone within reach  OT Visit Diagnosis: Other abnormalities of gait and mobility (R26.89);Pain Pain - Right/Left: Right Pain - part of body: Knee                Time: 8874-8860 OT Time Calculation (min): 14 min Charges:  OT General Charges $OT Visit: 1 Visit OT Evaluation $OT Eval Low Complexity: 1 Low OT Treatments $Self Care/Home Management : 8-22 mins  Warren SAUNDERS., MPH, MS, OTR/L ascom [phone] 06/04/24, 2:33 PM

## 2024-06-04 NOTE — Care Management Obs Status (Signed)
 MEDICARE OBSERVATION STATUS NOTIFICATION   Patient Details  Name: [name] MRN: [id number] Date of Birth: September 23, 1955   Medicare Observation Status Notification Given:  Yes    Rojelio SHAUNNA Rattler 06/04/2024, 12:14 PM

## 2024-06-28 ENCOUNTER — Ambulatory Visit: Payer: Self-pay | Admitting: Pulmonary Disease

## 2024-06-28 NOTE — Telephone Encounter (Signed)
 E2C2 Pulmonary Triage - Initial Assessment Questions "Chief Complaint (e.g., cough, sob, wheezing, fever, chills, sweat or additional symptoms) *Go to specific symptom protocol after initial questions. Sob with exertion, dizziness intermittently  "How long have symptoms been present?" yesterday  Have you tested for COVID or Flu? Note: If not, ask patient if a home test can be taken. If so, instruct patient to call back for positive results. No  MEDICINES:   "Have you used any OTC meds to help with symptoms?" No If yes, ask "What medications?" na  "Have you used your inhalers/maintenance medication?" No If yes, "What medications?" no  If inhaler, ask "How many puffs and how often?" Note: Review instructions on medication in the chart. na  OXYGEN: "Do you wear supplemental oxygen?" No If yes, "How many liters are you supposed to use?" na  "Do you monitor your oxygen levels?" No If yes, What is your reading (oxygen level) today? na  What is your usual oxygen saturation reading?  (Note: Pulmonary O2 sats should be 90% or greater) Does not check     Shortness of breath, dizziness Reason for Disposition  [1] MILD difficulty breathing (e.g., minimal/no SOB at rest, SOB with walking, pulse < 100) AND [2] NEW-onset or WORSE than normal  Answer Assessment - Initial Assessment Questions Advised UC today and ED/911 if symptoms worsen.  Advised patient to contact primary care and surgeon. Patient is not established care for primary care at [facility].  1. RESPIRATORY STATUS: Describe your breathing? (e.g., wheezing, shortness of breath, unable to speak, severe coughing)      Sob, dizziness(intermittently, only in morning); sob with exertion, not at rest; currently no shortness of breath Denies chest pain, wheezing, faint, coughing TKR-06/03/24; symptoms occurred after surger 2. ONSET: When did this breathing problem begin?      yesterday 3. PATTERN Does the difficult  breathing come and go, or has it been constant since it started?      Comes and goes 4. SEVERITY: How bad is your breathing? (e.g., mild, moderate, severe)      mild 5. RECURRENT SYMPTOM: Have you had difficulty breathing before? If Yes, ask: When was the last time? and What happened that time?      no 6. CARDIAC HISTORY: Do you have any history of heart disease? (e.g., heart attack, angina, bypass surgery, angioplasty)      no 7. LUNG HISTORY: Do you have any history of lung disease?  (e.g., pulmonary embolus, asthma, emphysema)     no 8. CAUSE: What do you think is causing the breathing problem?      Unsure 9. OTHER SYMPTOMS: Do you have any other symptoms? (e.g., chest pain, cough, dizziness, fever, runny nose)     denies 10. O2 SATURATION MONITOR:  Do you use an oxygen saturation monitor (pulse oximeter) at home? If Yes, ask: What is your reading (oxygen level) today? What is your usual oxygen saturation reading? (e.g., 95%)       no  12. TRAVEL: Have you traveled out of the country in the last month? (e.g., travel history, exposures)       No  Does not have any medications, out of inhaler and no oxygen.  Protocols used: Breathing Difficulty-A-AH

## 2024-06-28 NOTE — Telephone Encounter (Signed)
 Patient disconnected prior to transfer to NT. I have attempted to call the patient but there was no answer. Voicemail left for the patient to call back. Routing for additional attempts.      Copied from CRM #[id number]. Topic: Clinical - Red Word Triage >> Jun 28, 2024  3:19 PM Leila BROCKS wrote: Red Word that prompted transfer to Nurse Triage: Patient [phone] states is having shortness of breath, and dizziness started yesterday. Patient denies wheezing, pain, nor fever. Patient is asking to be seen or have Dr. Tamea send a medication? Informed patient, the office is closed and will get NT. Please advise. >> Jun 28, 2024  3:26 PM Chantha C wrote: Patient cannot wait on the line anymore and states she will call back on Monday. Informed NT.

## 2024-06-28 NOTE — Telephone Encounter (Signed)
 Pt triaged at 1601 by SJ, RN.

## 2024-07-01 NOTE — Telephone Encounter (Signed)
 FYI Patient reports symptoms are better today. She did not go to urgent care or ED. She reports shortness of breath is due to exertion/pain, from total knee surgery. Patient advised to go to urgent care or ED, if symptoms recur. NFN.

## 2024-07-01 NOTE — Telephone Encounter (Signed)
 If she is not on a blood thinner for DVT prophylaxis after knee surgery she needs to be seen in ED to make sure it is not a PE.

## 2024-07-01 NOTE — Telephone Encounter (Signed)
 Patient advised. She is not on blood thinner. Voiced understanding to go to ED. NFN.

## 2024-07-01 NOTE — Telephone Encounter (Signed)
 Pt is on Asprin 81 mg, per post-op notes from 11/4 at [facility].

## 2024-07-02 ENCOUNTER — Ambulatory Visit: Admitting: Dermatology

## 2024-07-09 ENCOUNTER — Other Ambulatory Visit: Payer: Self-pay

## 2024-07-09 ENCOUNTER — Emergency Department: Admitting: Certified Registered"

## 2024-07-09 ENCOUNTER — Emergency Department

## 2024-07-09 ENCOUNTER — Observation Stay
Admission: EM | Admit: 2024-07-09 | Discharge: 2024-07-10 | Disposition: A | Discharge status: Home / Self Care | Attending: Orthopedic Surgery | Admitting: Orthopedic Surgery

## 2024-07-09 ENCOUNTER — Encounter: Payer: Self-pay | Admitting: Emergency Medicine

## 2024-07-09 ENCOUNTER — Encounter: Admission: EM | Disposition: A | Payer: Self-pay | Source: Home / Self Care | Attending: Emergency Medicine

## 2024-07-09 DIAGNOSIS — T8133XA Disruption of traumatic injury wound repair, initial encounter: Secondary | ICD-10-CM | POA: Diagnosis present

## 2024-07-09 DIAGNOSIS — S81011A Laceration without foreign body, right knee, initial encounter: Principal | ICD-10-CM

## 2024-07-09 DIAGNOSIS — T8131XA Disruption of external operation (surgical) wound, not elsewhere classified, initial encounter: Secondary | ICD-10-CM

## 2024-07-09 HISTORY — PX: DEBRIDEMENT AND CLOSURE WOUND: SHX5614

## 2024-07-09 LAB — BASIC METABOLIC PANEL WITH GFR
Anion gap: 7 (ref 5–15)
BUN: 11 mg/dL (ref 8–23)
CO2: 27 mmol/L (ref 22–32)
Calcium: 8.6 mg/dL — ABNORMAL LOW (ref 8.9–10.3)
Chloride: 106 mmol/L (ref 98–111)
Creatinine, Ser: 0.83 mg/dL (ref 0.44–1.00)
GFR, Estimated: >60 mL/min (ref >60)
Glucose, Bld: 89 mg/dL (ref 70–99)
Potassium: 4 mmol/L (ref 3.5–5.1)
Sodium: 141 mmol/L (ref 135–145)

## 2024-07-09 LAB — CBC WITH DIFFERENTIAL/PLATELET
Abs Immature Granulocytes: 0.01 K/uL (ref 0.00–0.07)
Basophils Absolute: 0 K/uL (ref 0.0–0.1)
Basophils Relative: 0 %
Eosinophils Absolute: 0.1 K/uL (ref 0.0–0.5)
Eosinophils Relative: 2 %
HCT: 35.1 % — ABNORMAL LOW (ref 36.0–46.0)
Hemoglobin: 11.2 g/dL — ABNORMAL LOW (ref 12.0–15.0)
Immature Granulocytes: 0 %
Lymphocytes Relative: 25 %
Lymphs Abs: 1.4 K/uL (ref 0.7–4.0)
MCH: 30.4 pg (ref 26.0–34.0)
MCHC: 31.9 g/dL (ref 30.0–36.0)
MCV: 95.4 fL (ref 80.0–100.0)
Monocytes Absolute: 0.5 K/uL (ref 0.1–1.0)
Monocytes Relative: 9 %
Neutro Abs: 3.5 K/uL (ref 1.7–7.7)
Neutrophils Relative %: 64 %
Platelets: 250 K/uL (ref 150–400)
RBC: 3.68 MIL/uL — ABNORMAL LOW (ref 3.87–5.11)
RDW: 13.4 % (ref 11.5–15.5)
WBC: 5.6 K/uL (ref 4.0–10.5)
nRBC: 0 % (ref 0.0–0.2)

## 2024-07-09 SURGERY — DEBRIDEMENT, WOUND, WITH CLOSURE
Anesthesia: General | Laterality: Right

## 2024-07-09 MED ORDER — SURGIRINSE WOUND IRRIGATION SYSTEM - OPTIME
TOPICAL | Status: DC | PRN
  Administered 2024-07-09: 450 mL via TOPICAL

## 2024-07-09 MED ORDER — KETAMINE HCL 50 MG/5ML IJ SOSY
PREFILLED_SYRINGE | INTRAMUSCULAR | Status: DC | PRN
  Administered 2024-07-09: 20 mg via INTRAVENOUS
  Administered 2024-07-09: 10 mg via INTRAVENOUS

## 2024-07-09 MED ORDER — ASPIRIN 81 MG PO CHEW
81.0000 mg | CHEWABLE_TABLET | Freq: Two times a day (BID) | ORAL | Status: DC
  Administered 2024-07-10: 81 mg via ORAL
  Filled 2024-07-09: qty 1

## 2024-07-09 MED ORDER — MEMANTINE HCL 5 MG PO TABS
15.0000 mg | ORAL_TABLET | Freq: Two times a day (BID) | ORAL | Status: DC
  Administered 2024-07-10: 15 mg via ORAL
  Filled 2024-07-09: qty 3

## 2024-07-09 MED ORDER — ONDANSETRON HCL 4 MG/2ML IJ SOLN
4.0000 mg | Freq: Four times a day (QID) | INTRAMUSCULAR | Status: DC | PRN
  Administered 2024-07-10: 4 mg via INTRAVENOUS
  Filled 2024-07-09: qty 2

## 2024-07-09 MED ORDER — LEVOTHYROXINE SODIUM 112 MCG PO TABS
112.0000 ug | ORAL_TABLET | Freq: Every day | ORAL | Status: DC
  Administered 2024-07-10: 112 ug via ORAL
  Filled 2024-07-09: qty 1

## 2024-07-09 MED ORDER — BISACODYL 10 MG RE SUPP
10.0000 mg | Freq: Every day | RECTAL | Status: DC | PRN
Start: 2024-07-09 — End: 2024-07-10

## 2024-07-09 MED ORDER — SODIUM CHLORIDE (PF) 0.9 % IJ SOLN
INTRAMUSCULAR | Status: AC
  Filled 2024-07-09: qty 40

## 2024-07-09 MED ORDER — OXYCODONE HCL 5 MG PO TABS: 5.0000 mg | ORAL_TABLET | ORAL | Status: DC | PRN

## 2024-07-09 MED ORDER — MAGNESIUM HYDROXIDE 400 MG/5ML PO SUSP: 30.0000 mL | Freq: Every day | ORAL | Status: DC

## 2024-07-09 MED ORDER — HYDROGEN PEROXIDE 3 % EX SOLN
CUTANEOUS | Status: DC | PRN
Start: 2024-07-09 — End: 2024-07-09
  Administered 2024-07-09: 1

## 2024-07-09 MED ORDER — VANCOMYCIN HCL 1000 MG IV SOLR
INTRAVENOUS | Status: AC
  Filled 2024-07-09: qty 40

## 2024-07-09 MED ORDER — BUPIVACAINE HCL (PF) 0.5 % IJ SOLN
INTRAMUSCULAR | Status: AC
Start: 2024-07-09 — End: 2024-07-09
  Filled 2024-07-09: qty 60

## 2024-07-09 MED ORDER — FENTANYL CITRATE (PF) 100 MCG/2ML IJ SOLN
INTRAMUSCULAR | Status: AC
  Filled 2024-07-09: qty 2

## 2024-07-09 MED ORDER — SODIUM CHLORIDE 0.9 % IV BOLUS
1000.0000 mL | Freq: Once | INTRAVENOUS | Status: AC
  Administered 2024-07-09: 1000 mL via INTRAVENOUS

## 2024-07-09 MED ORDER — LACTATED RINGERS IV SOLN: INTRAVENOUS | Status: DC

## 2024-07-09 MED ORDER — DEXAMETHASONE SOD PHOSPHATE PF 10 MG/ML IJ SOLN
INTRAMUSCULAR | Status: DC | PRN
  Administered 2024-07-09: 10 mg via INTRAVENOUS

## 2024-07-09 MED ORDER — VENLAFAXINE HCL ER 150 MG PO CP24
300.0000 mg | ORAL_CAPSULE | Freq: Every day | ORAL | Status: DC
  Administered 2024-07-10: 300 mg via ORAL
  Filled 2024-07-09: qty 2

## 2024-07-09 MED ORDER — ONDANSETRON HCL 4 MG PO TABS: 4.0000 mg | ORAL_TABLET | Freq: Four times a day (QID) | ORAL | Status: DC | PRN

## 2024-07-09 MED ORDER — OXYCODONE HCL 5 MG PO TABS
ORAL_TABLET | ORAL | Status: AC
  Filled 2024-07-09: qty 1

## 2024-07-09 MED ORDER — TETANUS-DIPHTH-ACELL PERTUSSIS 5-2-15.5 LF-MCG/0.5 IM SUSP
0.5000 mL | Freq: Once | INTRAMUSCULAR | Status: AC
Start: 2024-07-09 — End: 2024-07-09
  Administered 2024-07-09: 0.5 mL via INTRAMUSCULAR
  Filled 2024-07-09: qty 0.5

## 2024-07-09 MED ORDER — CELECOXIB 200 MG PO CAPS
200.0000 mg | ORAL_CAPSULE | Freq: Two times a day (BID) | ORAL | Status: DC
  Administered 2024-07-09 – 2024-07-10 (×2): 200 mg via ORAL
  Filled 2024-07-09 (×2): qty 1

## 2024-07-09 MED ORDER — ACETAMINOPHEN 10 MG/ML IV SOLN
INTRAVENOUS | Status: DC | PRN
  Administered 2024-07-09: 1000 mg via INTRAVENOUS

## 2024-07-09 MED ORDER — ALUM & MAG HYDROXIDE-SIMETH 200-200-20 MG/5ML PO SUSP: 30.0000 mL | ORAL | Status: DC | PRN

## 2024-07-09 MED ORDER — ARIPIPRAZOLE 2 MG PO TABS: 5.0000 mg | ORAL_TABLET | ORAL | Status: DC

## 2024-07-09 MED ORDER — BUPROPION HCL ER (SR) 150 MG PO TB12: 150.0000 mg | ORAL_TABLET | ORAL | Status: DC

## 2024-07-09 MED ORDER — CHLORHEXIDINE GLUCONATE 0.12 % MT SOLN
15.0000 mL | Freq: Once | OROMUCOSAL | Status: AC
  Administered 2024-07-09: 15 mL via OROMUCOSAL

## 2024-07-09 MED ORDER — METOCLOPRAMIDE HCL 5 MG PO TABS
10.0000 mg | ORAL_TABLET | Freq: Three times a day (TID) | ORAL | Status: DC
  Administered 2024-07-10 (×2): 10 mg via ORAL
  Filled 2024-07-09 (×2): qty 2

## 2024-07-09 MED ORDER — MINERAL OIL LIGHT OIL
TOPICAL_OIL | Status: AC
Start: 2024-07-09 — End: 2024-07-09
  Filled 2024-07-09: qty 10

## 2024-07-09 MED ORDER — PHENOL 1.4 % MT LIQD
1.0000 | OROMUCOSAL | Status: DC | PRN
Start: 2024-07-09 — End: 2024-07-10

## 2024-07-09 MED ORDER — DEXMEDETOMIDINE HCL IN NACL 80 MCG/20ML IV SOLN
INTRAVENOUS | Status: DC | PRN
  Administered 2024-07-09: 8 ug via INTRAVENOUS
  Administered 2024-07-09: 4 ug via INTRAVENOUS

## 2024-07-09 MED ORDER — FLEET ENEMA RE ENEM: 1.0000 | ENEMA | Freq: Once | RECTAL | Status: DC | PRN

## 2024-07-09 MED ORDER — POLYVINYL ALCOHOL 1.4 % OP SOLN: 1.0000 [drp] | Freq: Every day | OPHTHALMIC | Status: DC | PRN

## 2024-07-09 MED ORDER — AMITRIPTYLINE HCL 25 MG PO TABS
150.0000 mg | ORAL_TABLET | Freq: Every day | ORAL | Status: DC
  Administered 2024-07-09: 150 mg via ORAL
  Filled 2024-07-09: qty 6

## 2024-07-09 MED ORDER — OXYCODONE HCL 5 MG PO TABS
5.0000 mg | ORAL_TABLET | Freq: Once | ORAL | Status: AC
  Administered 2024-07-09: 5 mg via ORAL

## 2024-07-09 MED ORDER — BUPIVACAINE LIPOSOME 1.3 % IJ SUSP
INTRAMUSCULAR | Status: AC
Start: 2024-07-09 — End: 2024-07-09
  Filled 2024-07-09: qty 20

## 2024-07-09 MED ORDER — ORAL CARE MOUTH RINSE: 15.0000 mL | Freq: Once | OROMUCOSAL | Status: AC

## 2024-07-09 MED ORDER — FENTANYL CITRATE (PF) 100 MCG/2ML IJ SOLN
INTRAMUSCULAR | Status: DC | PRN
  Administered 2024-07-09 (×2): 50 ug via INTRAVENOUS

## 2024-07-09 MED ORDER — CEFAZOLIN SODIUM-DEXTROSE 2-4 GM/100ML-% IV SOLN
2.0000 g | Freq: Four times a day (QID) | INTRAVENOUS | Status: AC
  Administered 2024-07-10 (×2): 2 g via INTRAVENOUS
  Filled 2024-07-09 (×2): qty 100

## 2024-07-09 MED ORDER — SODIUM CHLORIDE 0.9 % IR SOLN
Status: DC | PRN
  Administered 2024-07-09 (×3): 3000 mL

## 2024-07-09 MED ORDER — KETAMINE HCL 50 MG/5ML IJ SOSY
PREFILLED_SYRINGE | INTRAMUSCULAR | Status: AC
Start: 2024-07-09 — End: 2024-07-09
  Filled 2024-07-09: qty 5

## 2024-07-09 MED ORDER — BUSPIRONE HCL 10 MG PO TABS
10.0000 mg | ORAL_TABLET | Freq: Three times a day (TID) | ORAL | Status: DC
  Administered 2024-07-10 (×2): 10 mg via ORAL
  Filled 2024-07-09 (×2): qty 1

## 2024-07-09 MED ORDER — DIPHENHYDRAMINE HCL 12.5 MG/5ML PO ELIX: 12.5000 mg | ORAL_SOLUTION | ORAL | Status: DC | PRN

## 2024-07-09 MED ORDER — TRAMADOL HCL 50 MG PO TABS: 50.0000 mg | ORAL_TABLET | ORAL | Status: DC | PRN

## 2024-07-09 MED ORDER — ACETAMINOPHEN 10 MG/ML IV SOLN
INTRAVENOUS | Status: AC
  Filled 2024-07-09: qty 100

## 2024-07-09 MED ORDER — MIDAZOLAM HCL (PF) 2 MG/2ML IJ SOLN
INTRAMUSCULAR | Status: DC | PRN
  Administered 2024-07-09: 2 mg via INTRAVENOUS

## 2024-07-09 MED ORDER — FENTANYL CITRATE (PF) 100 MCG/2ML IJ SOLN
25.0000 ug | INTRAMUSCULAR | Status: DC | PRN
  Administered 2024-07-09 (×3): 50 ug via INTRAVENOUS

## 2024-07-09 MED ORDER — GENTAMICIN SULFATE 40 MG/ML IJ SOLN
INTRAMUSCULAR | Status: AC
Start: 2024-07-09 — End: 2024-07-09
  Filled 2024-07-09: qty 6

## 2024-07-09 MED ORDER — ACETAMINOPHEN 10 MG/ML IV SOLN
1000.0000 mg | Freq: Four times a day (QID) | INTRAVENOUS | Status: DC
  Administered 2024-07-09 – 2024-07-10 (×3): 1000 mg via INTRAVENOUS
  Filled 2024-07-09 (×3): qty 100

## 2024-07-09 MED ORDER — CELECOXIB 200 MG PO CAPS: 200.0000 mg | ORAL_CAPSULE | Freq: Two times a day (BID) | ORAL | Status: DC

## 2024-07-09 MED ORDER — ARIPIPRAZOLE 5 MG PO TABS
15.0000 mg | ORAL_TABLET | Freq: Every day | ORAL | Status: DC
  Administered 2024-07-10: 15 mg via ORAL
  Filled 2024-07-09: qty 3

## 2024-07-09 MED ORDER — SODIUM CHLORIDE 0.9 % IV SOLN: INTRAVENOUS | Status: DC

## 2024-07-09 MED ORDER — MENTHOL 3 MG MT LOZG: 1.0000 | LOZENGE | OROMUCOSAL | Status: DC | PRN

## 2024-07-09 MED ORDER — PANTOPRAZOLE SODIUM 40 MG PO TBEC
40.0000 mg | DELAYED_RELEASE_TABLET | Freq: Two times a day (BID) | ORAL | Status: DC
  Administered 2024-07-09 – 2024-07-10 (×2): 40 mg via ORAL
  Filled 2024-07-09 (×2): qty 1

## 2024-07-09 MED ORDER — MEMANTINE HCL 5 MG PO TABS: 5.0000 mg | ORAL_TABLET | ORAL | Status: DC

## 2024-07-09 MED ORDER — SENNOSIDES-DOCUSATE SODIUM 8.6-50 MG PO TABS
1.0000 | ORAL_TABLET | Freq: Two times a day (BID) | ORAL | Status: DC
  Administered 2024-07-09 – 2024-07-10 (×2): 1 via ORAL
  Filled 2024-07-09 (×2): qty 1

## 2024-07-09 MED ORDER — CEFAZOLIN SODIUM-DEXTROSE 2-4 GM/100ML-% IV SOLN
INTRAVENOUS | Status: AC
  Filled 2024-07-09: qty 100

## 2024-07-09 MED ORDER — MIDAZOLAM HCL 2 MG/2ML IJ SOLN
INTRAMUSCULAR | Status: AC
  Filled 2024-07-09: qty 2

## 2024-07-09 MED ORDER — LIDOCAINE HCL (CARDIAC) PF 100 MG/5ML IV SOSY
PREFILLED_SYRINGE | INTRAVENOUS | Status: DC | PRN
  Administered 2024-07-09: 100 mg via INTRAVENOUS

## 2024-07-09 MED ORDER — ONDANSETRON HCL 4 MG/2ML IJ SOLN
INTRAMUSCULAR | Status: DC | PRN
  Administered 2024-07-09: 4 mg via INTRAVENOUS

## 2024-07-09 MED ORDER — PROPOFOL 10 MG/ML IV BOLUS
INTRAVENOUS | Status: DC | PRN
  Administered 2024-07-09: 40 mg via INTRAVENOUS
  Administered 2024-07-09: 30 mg via INTRAVENOUS
  Administered 2024-07-09: 100 ug/kg/min via INTRAVENOUS

## 2024-07-09 MED ORDER — OXYCODONE HCL 5 MG PO TABS
10.0000 mg | ORAL_TABLET | ORAL | Status: DC | PRN
  Administered 2024-07-09: 10 mg via ORAL
  Filled 2024-07-09 (×2): qty 2

## 2024-07-09 MED ORDER — ACETAMINOPHEN 325 MG PO TABS: 325.0000 mg | ORAL_TABLET | Freq: Four times a day (QID) | ORAL | Status: DC | PRN

## 2024-07-09 MED ORDER — HYDROMORPHONE HCL 1 MG/ML IJ SOLN
0.5000 mg | INTRAMUSCULAR | Status: DC | PRN
  Administered 2024-07-10: 1 mg via INTRAVENOUS
  Filled 2024-07-09: qty 1

## 2024-07-09 MED ORDER — VANCOMYCIN HCL 1000 MG IV SOLR
INTRAVENOUS | Status: DC | PRN
Start: 2024-07-09 — End: 2024-07-09
  Administered 2024-07-09: 1000 mg

## 2024-07-09 MED ORDER — MIRABEGRON ER 25 MG PO TB24
25.0000 mg | ORAL_TABLET | Freq: Every day | ORAL | Status: DC
  Administered 2024-07-10: 25 mg via ORAL
  Filled 2024-07-09: qty 1

## 2024-07-09 MED ORDER — DONEPEZIL HCL 5 MG PO TABS
10.0000 mg | ORAL_TABLET | Freq: Every day | ORAL | Status: DC
  Administered 2024-07-09: 10 mg via ORAL
  Filled 2024-07-09: qty 2

## 2024-07-09 MED ORDER — CEFAZOLIN SODIUM-DEXTROSE 2-4 GM/100ML-% IV SOLN
2.0000 g | Freq: Three times a day (TID) | INTRAVENOUS | Status: DC
  Administered 2024-07-09 (×2): 2 g via INTRAVENOUS
  Filled 2024-07-09: qty 100

## 2024-07-09 MED ORDER — PROPOFOL 1000 MG/100ML IV EMUL
INTRAVENOUS | Status: AC
  Filled 2024-07-09: qty 100

## 2024-07-09 SURGICAL SUPPLY — 77 items
BIT DRILL QUICK REL 1/8 2PK SL (BIT) ×1 IMPLANT
BLADE SAGITTAL 25.0X1.19X90 (BLADE) IMPLANT
BLADE SAW 70X12.5 (BLADE) IMPLANT
BLADE SAW 90X13X1.19 OSCILLAT (BLADE) IMPLANT
BLADE SAW 90X25X1.19 OSCILLAT (BLADE) IMPLANT
BLADE SAW SGTL 13X75X1.27 (BLADE) IMPLANT
BLADE SW THK.38XMED LNG THN (BLADE) IMPLANT
BRUSH SCRUB EZ PLAIN DRY (MISCELLANEOUS) ×1 IMPLANT
CANISTER WOUND CARE 500ML ATS (WOUND CARE) IMPLANT
CNTNR URN SCR LID CUP LEK RST (MISCELLANEOUS) IMPLANT
COOLER ICEMAN CLASSIC (MISCELLANEOUS) ×1 IMPLANT
CRADLE LAMINECT ARM (MISCELLANEOUS) ×1 IMPLANT
CUFF TRNQT CYL 24X4X16.5-23 (TOURNIQUET CUFF) IMPLANT
CUFF TRNQT CYL 30X4X21-28X (TOURNIQUET CUFF) IMPLANT
DRAPE SHEET LG 3/4 BI-LAMINATE (DRAPES) ×2 IMPLANT
DRAPE TABLE BACK 80X90 (DRAPES) ×1 IMPLANT
DRESSING PEEL AND PLAC PRVNA20 (GAUZE/BANDAGES/DRESSINGS) IMPLANT
DRSG AQUACEL AG ADV 3.5X14 (GAUZE/BANDAGES/DRESSINGS) IMPLANT
DRSG MEPILEX SACRM 8.7X9.8 (GAUZE/BANDAGES/DRESSINGS) ×1 IMPLANT
DRSG TEGADERM 4X4.75 (GAUZE/BANDAGES/DRESSINGS) IMPLANT
DURAPREP 26ML APPLICATOR (WOUND CARE) ×2 IMPLANT
ELECT CAUTERY BLADE 6.4 (BLADE) ×1 IMPLANT
ELECTRODE REM PT RTRN 9FT ADLT (ELECTROSURGICAL) ×1 IMPLANT
EVACUATOR 1/8 PVC DRAIN (DRAIN) IMPLANT
GAUZE SPONGE 4X4 12PLY STRL (GAUZE/BANDAGES/DRESSINGS) IMPLANT
GAUZE XEROFORM 1X8 LF (GAUZE/BANDAGES/DRESSINGS) IMPLANT
GLOVE BIO SURGEON STRL SZ7.5 (GLOVE) ×4 IMPLANT
GLOVE BIOGEL PI IND STRL 7.0 (GLOVE) ×3 IMPLANT
GLOVE SURG SYN 7.0 PF PI (GLOVE) ×1 IMPLANT
GOWN STRL REUS W/ TWL LRG LVL3 (GOWN DISPOSABLE) ×4 IMPLANT
GOWN TOGA ZIPPER T7+ PEEL AWAY (MISCELLANEOUS) ×1 IMPLANT
HANDPIECE VERSAJET DEBRIDEMENT (MISCELLANEOUS) IMPLANT
HOLDER FOLEY CATH W/STRAP (MISCELLANEOUS) ×1 IMPLANT
HOOD PEEL AWAY T7 (MISCELLANEOUS) ×1 IMPLANT
KIT PREVENA INCISION MGT 13 (CANNISTER) IMPLANT
KIT TURNOVER KIT A (KITS) ×1 IMPLANT
KNIFE SCULPS 14X20 (INSTRUMENTS) IMPLANT
LABEL OR SOLS (LABEL) ×1 IMPLANT
MANIFOLD NEPTUNE II (INSTRUMENTS) ×2 IMPLANT
MAT ABSORB FLUID 56X50 GRAY (MISCELLANEOUS) ×1 IMPLANT
NDL SAFETY ECLIP 18X1.5 (MISCELLANEOUS) ×2 IMPLANT
NDL SPNL 20GX3.5 QUINCKE YW (NEEDLE) IMPLANT
PACK COLON CLEAN CLOSURE (MISCELLANEOUS) IMPLANT
PACK TOTAL KNEE (MISCELLANEOUS) ×1 IMPLANT
PAD ABD DERMACEA PRESS 5X9 (GAUZE/BANDAGES/DRESSINGS) IMPLANT
PAD ARMBOARD POSITIONER FOAM (MISCELLANEOUS) ×3 IMPLANT
PAD COLD UNI WRAP-ON (PAD) ×1 IMPLANT
PENCIL SMOKE EVACUATOR COATED (MISCELLANEOUS) ×1 IMPLANT
SOL .9 NS 3000ML IRR UROMATIC (IV SOLUTION) ×4 IMPLANT
SOLN 0.9% NACL POUR BTL 1000ML (IV SOLUTION) ×1 IMPLANT
SOLN STERILE WATER BTL 1000 ML (IV SOLUTION) ×1 IMPLANT
SOLUTION IRRIG SURGIPHOR (IV SOLUTION) ×1 IMPLANT
SOLUTION PREP PVP 2OZ (MISCELLANEOUS) ×1 IMPLANT
SPONGE DRAIN TRACH 4X4 STRL 2S (GAUZE/BANDAGES/DRESSINGS) IMPLANT
SPONGE T-LAP 18X18 ~~LOC~~+RFID (SPONGE) IMPLANT
STAPLER SKIN PROX 35W (STAPLE) IMPLANT
STOCKINETTE BIAS CUT 4 980044 (GAUZE/BANDAGES/DRESSINGS) IMPLANT
STOCKINETTE IMPERV 14X48 (MISCELLANEOUS) ×1 IMPLANT
STRAP TIBIA SHORT (MISCELLANEOUS) ×1 IMPLANT
SUCTION TUBE FRAZIER 10FR DISP (SUCTIONS) ×1 IMPLANT
SUT MON AB 2-0 CT1 36 (SUTURE) IMPLANT
SUT MON AB-0 CT1 36 (SUTURE) IMPLANT
SUT PDS AB 1 CT 36 (SUTURE) IMPLANT
SUT VIC AB 0 CT1 36 (SUTURE) IMPLANT
SUT VIC AB 1 CT1 36 (SUTURE) IMPLANT
SUT VIC AB 2-0 CT1 (SUTURE) IMPLANT
SWAB CULTURE AMIES ANAERIB BLU (MISCELLANEOUS) IMPLANT
SYR 20ML LL LF (SYRINGE) ×2 IMPLANT
SYR 30ML LL (SYRINGE) IMPLANT
SYR BULB IRRIG 60ML STRL (SYRINGE) IMPLANT
TIP BRUSH PULSAVAC PLUS 24.33 (MISCELLANEOUS) IMPLANT
TIP FAN IRRIG PULSAVAC PLUS (DISPOSABLE) ×1 IMPLANT
TOWEL OR 17X26 4PK STRL BLUE (TOWEL DISPOSABLE) IMPLANT
TOWER CARTRIDGE SMART MIX (DISPOSABLE) IMPLANT
TRAP FLUID SMOKE EVACUATOR (MISCELLANEOUS) ×1 IMPLANT
TRAY FOLEY MTR SLVR 16FR STAT (SET/KITS/TRAYS/PACK) ×1 IMPLANT
TUBING CONNECTING 10 (TUBING) ×2 IMPLANT

## 2024-07-09 NOTE — Op Note (Signed)
 OPERATIVE NOTE  DATE OF SURGERY:  07/09/2024  PATIENT NAME:  [name] [name]   DOB: [birth year]  MRN: [id number]   PRE-OPERATIVE DIAGNOSIS: Traumatic dehiscence of the right knee wound, status post right total knee arthroplasty  POST-OPERATIVE DIAGNOSIS:  Same  PROCEDURE: Irrigation, debridement, and closure of the traumatic dehiscence of the right knee incision  SURGEON:  Lynwood SHAUNNA Mardee Mickey., M.D.   ASSISTANT: Irrigation, debridement, and closure of the traumatic dehiscence of the right knee wound   ANESTHESIA: general  ESTIMATED BLOOD LOSS: 10 mL  FLUIDS REPLACED: 900 mL of crystalloid  TOURNIQUET TIME: 28   DRAINS: Wound VAC  INDICATIONS FOR SURGERY: [name] [name] is a 68 y.o. year old female who is approximately 1 month status post right total knee arthroplasty.  She slipped on some leaves in her carport and fell, landing on the right knee.  She sustained a traumatic dehiscence of the mid portion of the surgical incision.. After discussion of the risks and benefits of surgical intervention, the patient expressed understanding of the risks benefits and agree with plans for irrigation, debridement, and closure of the right knee wound dehiscence.   PROCEDURE IN DETAIL: The patient was brought into the operating room and, after adequate general anesthesia was achieved, tourniquet was placed on the patient's upper right thigh.  Patient's right knee and leg were cleaned and prepped with Betadine and draped usual sterile fashion.  A timeout was performed as per usual protocol.  The right upper extremity was elevated and the tourniquet was inflated to 300 mmHg.  Inspection of the knee demonstrated dehiscence of the distal half of the anterior surgical incision.  The wound was explored and noted to be superficial in nature.  There did not appear to be violation of the joint capsule.  The wound was debrided using a Versajet.  Some sharp debridement of tissue was performed using Metzenbaum  scissors.  The wound was then irrigated with 3000 L of normal saline using pulsatile lavage.  Next, the wound was soaked with dilute hydrogen peroxide  and allowed to soak for at least 2 minutes.  This was followed by irrigation with an additional 3 L of normal saline using pulsatile lavage.  The wound was then irrigated and soaked using 450 mL of surgery for.  A final 3000 L normal saline was used to irrigate the wound using pulsatile lavage.  The tourniquet was deflated after total tourniquet time of 28 minutes.  Good hemostasis was appreciated.  1 g of vancomycin  powder was then dispersed in the wound bed.  The wound was then approximated in layers using first #0 Monocryl followed by #2-0 Monocryl.  The skin was closed with skin staples.  A 13 cm Prevena wound VAC was applied in place to suction.  A sterile dressing was then applied.  Patient tolerated procedure well.  She was transported to the recovery room in stable condition.  Artisha Capri P. Shane Badeaux, Jr. M.D.

## 2024-07-09 NOTE — ED Provider Notes (Signed)
 [facility] Provider Note    Event Date/Time   First MD Initiated Contact with Patient 07/09/24 1234     (approximate)   History   Fall   HPI  [name] [name] is a 68 y.o. female status post total knee replacement on 10/23 by Dr. Mardee who presents today for evaluation of knee injury.  Patient reports that she had a trip and fall at 1130 this morning and landed directly on her affected knee.  This caused her surgical incision to open up.  She denies pain and is able to range her knee.  No head strike or LOC.  No other injury sustained.  Patient Active Problem List   Diagnosis Date Noted   History of total knee arthroplasty, right 06/03/2024   Hepatitis C 08/19/2016   Depression 08/19/2016   Insomnia 08/19/2016   Hypothyroidism 08/19/2016          Physical Exam   Triage Vital Signs: ED Triage Vitals  Encounter Vitals Group     BP 07/09/24 1204 120/71     Girls Systolic BP Percentile --      Girls Diastolic BP Percentile --      Boys Systolic BP Percentile --      Boys Diastolic BP Percentile --      Pulse Rate 07/09/24 1203 89     Resp 07/09/24 1203 16     Temp 07/09/24 1203 98.6 F (37 C)     Temp src --      SpO2 07/09/24 1203 100 %     Weight 07/09/24 1201 173 lb 15.1 oz (78.9 kg)     Height 07/09/24 1223 5' 5 (1.651 m)     Head Circumference --      Peak Flow --      Pain Score 07/09/24 1201 0     Pain Loc --      Pain Education --      Exclude from Growth Chart --     Most recent vital signs: Vitals:   07/09/24 1204 07/09/24 1446  BP: 120/71 (!) 112/98  Pulse:  82  Resp:  15  Temp:  (!) 97.4 F (36.3 C)  SpO2:  99%    Physical Exam Vitals and nursing note reviewed.  Constitutional:      General: Awake and alert. No acute distress.    Appearance: Normal appearance. The patient is normal weight.  HENT:     Head: Normocephalic and atraumatic.     Mouth: Mucous membranes are moist.  Eyes:     General: PERRL. Normal  EOMs        Right eye: No discharge.        Left eye: No discharge.     Conjunctiva/sclera: Conjunctivae normal.  Cardiovascular:     Rate and Rhythm: Normal rate and regular rhythm.     Pulses: Normal pulses.  Pulmonary:     Effort: Pulmonary effort is normal. No respiratory distress.     Breath sounds: Normal breath sounds.  Abdominal:     Abdomen is soft. There is no abdominal tenderness. No rebound or guarding. No distention. Musculoskeletal:        General: No swelling. Normal range of motion.     Cervical back: Normal range of motion and neck supple.  Right lower extremity: As pictured below.  Patient has very large gaping wound to her right knee along the surgical incision.  Able to range her leg. Skin:    General:  Skin is warm and dry.     Capillary Refill: Capillary refill takes less than 2 seconds.     Findings: No rash.  Neurological:     Mental Status: The patient is awake and alert.         ED Results / Procedures / Treatments   Labs (all labs ordered are listed, but only abnormal results are displayed) Labs Reviewed  BASIC METABOLIC PANEL WITH GFR - Abnormal; Notable for the following components:      Result Value   Calcium 8.6 (*)    All other components within normal limits  CBC WITH DIFFERENTIAL/PLATELET - Abnormal; Notable for the following components:   RBC 3.68 (*)    Hemoglobin 11.2 (*)    HCT 35.1 (*)    All other components within normal limits     EKG     RADIOLOGY I independently reviewed and interpreted imaging and agree with radiologists findings.     PROCEDURES:  Critical Care performed:   Procedures   MEDICATIONS ORDERED IN ED: Medications  ceFAZolin  (ANCEF ) IVPB 2g/100 mL premix ( Intravenous Automatically Held 07/17/24 2200)  lactated ringers  infusion ( Intravenous New Bag/Given 07/09/24 1509)  sodium chloride  0.9 % bolus 1,000 mL (has no administration in time range)  Tdap (ADACEL ) injection 0.5 mL (0.5 mLs  Intramuscular Given 07/09/24 1358)  chlorhexidine  (PERIDEX ) 0.12 % solution 15 mL (15 mLs Mouth/Throat Given 07/09/24 1508)    Or  Oral care mouth rinse ( Mouth Rinse See Alternative 07/09/24 1508)     IMPRESSION / MDM / ASSESSMENT AND PLAN / ED COURSE  I reviewed the triage vital signs and the nursing notes.   Differential diagnosis includes, but is not limited to, traumatic arthrotomy, laceration, contusion, osseous injury.  Patient is awake and alert hemodynamically stable and afebrile.  She is able to range her knee.  She has a normal straight leg raise.  She has a very large dehisced surgical incision.  I consulted orthopedics.  Patient was seen by Dr. Mardee who has recommended irrigation, debridement, and closure of the knee wound.  Patient is in agreement with this.  No other injury sustained.  No head strike or LOC.  She denies pain elsewhere.  She is not anticoagulated.  She is in agreement with plan.  Patient was admitted by the orthopedic service.   Patient's presentation is most consistent with acute presentation with potential threat to life or bodily function.   Clinical Course as of 07/09/24 1528  Tue Jul 09, 2024  1243 Orthopedics paged [JP]  1254 Discussed with Dr. Lorelle, he originally requested admission to hospitalist, though I discussed with the hospitalist and they subsequently discussed with orthopedics directly and orthopedics will admit primarily [JP]    Clinical Course User Index [JP] Wilbon Obenchain E, PA-C     FINAL CLINICAL IMPRESSION(S) / ED DIAGNOSES   Final diagnoses:  Knee laceration, right, initial encounter  Surgical wound dehiscence, initial encounter     Rx / DC Orders   ED Discharge Orders     None        Note:  This document was prepared using Dragon voice recognition software and may include unintentional dictation errors.   Chantry Headen E, PA-C 07/09/24 1528    Jacolyn Pae, MD 07/09/24 1538
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
## 2024-07-09 NOTE — ED Triage Notes (Signed)
 First nurse note: Pt to ED via [facility] from home. Pt had fall onto brick opening up post up incision with dehiscence. Pt had knee replacement on 06/03/24.

## 2024-07-09 NOTE — Anesthesia Preprocedure Evaluation (Signed)
 Anesthesia Evaluation  Patient identified by MRN, date of birth, ID band Patient awake    Reviewed: Allergy & Precautions, H&P , NPO status , Patient's Chart, lab work & pertinent test results, reviewed documented beta blocker date and time   History of Anesthesia Complications (+) PONV and history of anesthetic complications  Airway Mallampati: III  TM Distance: >3 FB Neck ROM: full    Dental  (+) Dental Advidsory Given, Upper Dentures, Lower Dentures   Pulmonary neg shortness of breath, neg sleep apnea, neg COPD, neg recent URI, former smoker   Pulmonary exam normal breath sounds clear to auscultation       Cardiovascular Exercise Tolerance: Good (-) hypertension(-) angina (-) Past MI, (-) Cardiac Stents and (-) CABG Normal cardiovascular exam+ dysrhythmias (RBBB) (-) Valvular Problems/Murmurs Rhythm:regular Rate:Normal     Neuro/Psych  PSYCHIATRIC DISORDERS  Depression    negative neurological ROS     GI/Hepatic ,GERD  ,,(+) Hepatitis -, C  Endo/Other  neg diabetesHypothyroidism    Renal/GU negative Renal ROS  negative genitourinary   Musculoskeletal   Abdominal   Peds  Hematology negative hematology ROS (+)   Anesthesia Other Findings Fell yesterday with large bruise around right eye.  No evidence of intracranial hemorrhage.  Denies headaches, nausea, changes in vision, new weakness, or new numbness.  Past Medical History: No date: Actinic keratosis No date: Arthritis     Comment:  fingers 12/13/2022: Basal cell carcinoma     Comment:  left upper abdomen, EDC No date: Carpal tunnel syndrome of left wrist No date: Cigarette smoker No date: Depression No date: Dyspnea No date: GERD (gastroesophageal reflux disease) No date: Grade I diastolic dysfunction 1985: Hepatitis C     Comment:  took treatment No date: History of methicillin resistant staphylococcus aureus (MRSA) No date: Hypothyroidism No date:  Insomnia No date: Memory loss No date: Osteoarthritis of right knee No date: Osteoporosis No date: PONV (postoperative nausea and vomiting)     Comment:  n/v with tonsillectomy No date: RBBB (right bundle branch block) 03/06/2023: SCC (squamous cell carcinoma)     Comment:  left lower lateral leg, clear with bx on 06/20/23, Mohs               cancelled 12/27/2021: Squamous cell carcinoma of skin     Comment:  Left pretibial - EDC 04/03/2024: Squamous cell carcinoma of skin     Comment:  L upper knee, EDC No date: Tremor No date: Wears dentures     Comment:  full upper, partial lower   Reproductive/Obstetrics negative OB ROS                              Anesthesia Physical Anesthesia Plan  ASA: 2  Anesthesia Plan: General   Post-op Pain Management:    Induction: Intravenous  PONV Risk Score and Plan: 3 and Propofol  infusion and TIVA  Airway Management Planned: LMA  Additional Equipment:   Intra-op Plan:   Post-operative Plan: Extubation in OR  Informed Consent: I have reviewed the patients History and Physical, chart, labs and discussed the procedure including the risks, benefits and alternatives for the proposed anesthesia with the patient or authorized representative who has indicated his/her understanding and acceptance.     Dental Advisory Given  Plan Discussed with: Anesthesiologist, CRNA and Surgeon  Anesthesia Plan Comments:          Anesthesia Quick Evaluation

## 2024-07-09 NOTE — ED Notes (Signed)
 See triage note  Presents s/p fall   Had recent knee replacement .SABRALanded on right knee   Incision site open

## 2024-07-09 NOTE — Transfer of Care (Signed)
 Immediate Anesthesia Transfer of Care Note  Patient: [name] [name]  Procedure(s) Performed: DEBRIDEMENT, WOUND, WITH CLOSURE (Right)  Patient Location: PACU  Anesthesia Type:General  Level of Consciousness: drowsy  Airway & Oxygen Therapy: Patient Spontanous Breathing and Patient connected to face mask oxygen  Post-op Assessment: Report given to RN and Post -op Vital signs reviewed and stable  Post vital signs: Reviewed and stable  Last Vitals:  Vitals Value Taken Time  BP 136/83 07/09/24 18:20  Temp    Pulse 66 07/09/24 18:21  Resp 13 07/09/24 18:21  SpO2 100 % 07/09/24 18:21  Vitals shown include unfiled device data.  Last Pain:  Vitals:   07/09/24 1446  PainSc: 0-No pain         Complications: No notable events documented.

## 2024-07-10 ENCOUNTER — Encounter: Payer: Self-pay | Admitting: Orthopedic Surgery

## 2024-07-10 MED ORDER — BUPROPION HCL ER (SR) 150 MG PO TB12
150.0000 mg | ORAL_TABLET | Freq: Every day | ORAL | Status: DC
  Filled 2024-07-10: qty 1

## 2024-07-10 MED ORDER — BUPROPION HCL ER (SR) 150 MG PO TB12
300.0000 mg | ORAL_TABLET | Freq: Every day | ORAL | Status: DC
  Administered 2024-07-10: 300 mg via ORAL
  Filled 2024-07-10: qty 2

## 2024-07-10 MED ORDER — OXYCODONE HCL 5 MG PO TABS: 5.0000 mg | ORAL_TABLET | ORAL | 0 refills | Status: AC | PRN

## 2024-07-10 MED ORDER — TRAMADOL HCL 50 MG PO TABS: 50.0000 mg | ORAL_TABLET | Freq: Four times a day (QID) | ORAL | 0 refills | Status: AC | PRN

## 2024-07-10 NOTE — Discharge Instructions (Signed)
 INSTRUCTIONS AFTER Surgery  Remove items at home which could result in a fall. This includes throw rugs or furniture in walking pathways ICE to the affected joint every three hours while awake for 30 minutes at a time, for at least the first 3-5 days, and then as needed for pain and swelling.  Continue to use ice for pain and swelling. You may notice swelling that will progress down to the foot and ankle.  This is normal after surgery.  Elevate your leg when you are not up walking on it.   Continue to use the breathing machine you got in the hospital (incentive spirometer) which will help keep your temperature down.  It is common for your temperature to cycle up and down following surgery, especially at night when you are not up moving around and exerting yourself.  The breathing machine keeps your lungs expanded and your temperature down.   DIET:  As you were doing prior to hospitalization, we recommend a well-balanced diet.  DRESSING / WOUND CARE / SHOWERING  Keep the wound VAC in place until follow-up next week at [facility] clinic orthopedics.  No showering.  ACTIVITY  Increase activity slowly as tolerated, but follow the weight bearing instructions below.   No driving for 6 weeks or until further direction given by your physician.  You cannot drive while taking narcotics.  No lifting or carrying greater than 10 lbs. until further directed by your surgeon. Avoid periods of inactivity such as sitting longer than an hour when not asleep. This helps prevent blood clots.  You may return to work once you are authorized by your doctor.     WEIGHT BEARING  Weightbearing as tolerated on the right   EXERCISES Continued physical therapy with range of motion and strengthening as well as ambulation training.  CONSTIPATION  Constipation is defined medically as fewer than three stools per week and severe constipation as less than one stool per week.  Even if you have a regular bowel pattern at  home, your normal regimen is likely to be disrupted due to multiple reasons following surgery.  Combination of anesthesia, postoperative narcotics, change in appetite and fluid intake all can affect your bowels.   YOU MUST use at least one of the following options; they are listed in order of increasing strength to get the job done.  They are all available over the counter, and you may need to use some, POSSIBLY even all of these options:    Drink plenty of fluids (prune juice may be helpful) and high fiber foods Colace 100 mg by mouth twice a day  Senokot for constipation as directed and as needed Dulcolax (bisacodyl ), take with full glass of water  Miralax (polyethylene glycol) once or twice a day as needed.  If you have tried all these things and are unable to have a bowel movement in the first 3-4 days after surgery call either your surgeon or your primary doctor.    If you experience loose stools or diarrhea, hold the medications until you stool forms back up.  If your symptoms do not get better within 1 week or if they get worse, check with your doctor.  If you experience the worst abdominal pain ever or develop nausea or vomiting, please contact the office immediately for further recommendations for treatment.   ITCHING:  If you experience itching with your medications, try taking only a single pain pill, or even half a pain pill at a time.  You can also  use Benadryl  over the counter for itching or also to help with sleep.   TED HOSE STOCKINGS:  Use stockings on both legs until for at least 2 weeks or as directed by physician office. They may be removed at night for sleeping.  MEDICATIONS:  See your medication summary on the "After Visit Summary" that nursing will review with you.  You may have some home medications which will be placed on hold until you complete the course of blood thinner medication.  It is important for you to complete the blood thinner medication as  prescribed.  PRECAUTIONS:  If you experience chest pain or shortness of breath - call 911 immediately for transfer to the hospital emergency department.   If you develop a fever greater that 101 F, purulent drainage from wound, increased redness or drainage from wound, foul odor from the wound/dressing, or calf pain - CONTACT YOUR SURGEON.                                                   FOLLOW-UP APPOINTMENTS:  If you do not already have a post-op appointment, please call the office for an appointment to be seen by your surgeon.  Guidelines for how soon to be seen are listed in your "After Visit Summary", but are typically between 1-4 weeks after surgery.  OTHER INSTRUCTIONS:   Leave the wound VAC in place until removal at [facility] clinic orthopedics.  MAKE SURE YOU:  Understand these instructions.  Get help right away if you are not doing well or get worse.    Thank you for letting us  be a part of your medical care team.  It is a privilege we respect greatly.  We hope these instructions will help you stay on track for a fast and full recovery!

## 2024-07-10 NOTE — Care Management Obs Status (Signed)
 MEDICARE OBSERVATION STATUS NOTIFICATION   Patient Details  Name: [name] [name] MRN: [id number] Date of Birth: 03-Jun-1956   Medicare Observation Status Notification Given:  Yes    Rojelio SHAUNNA Rattler 07/10/2024, 12:57 PM

## 2024-07-10 NOTE — Progress Notes (Signed)
  Subjective: 1 Day Post-Op Procedure(s) (LRB): DEBRIDEMENT, WOUND, WITH CLOSURE (Right) Patient reports pain as mild.   Patient is well, and has had no acute complaints or problems Plan is to go Home after hospital stay. Negative for chest pain and shortness of breath Fever: no Gastrointestinal: Negative for nausea and vomiting  Objective: Vital signs in last 24 hours: Temp:  [96.6 F (35.9 C)-98.6 F (37 C)] 98 F (36.7 C) (11/26 0500) Pulse Rate:  [66-91] 76 (11/26 0500) Resp:  [11-23] 17 (11/26 0500) BP: (108-165)/(64-100) 108/65 (11/26 0500) SpO2:  [90 %-100 %] 94 % (11/26 0500) Weight:  [78.9 kg] 78.9 kg (11/25 1446)  Intake/Output from previous day:  Intake/Output Summary (Last 24 hours) at 07/10/2024 0716 Last data filed at 07/10/2024 9376 Gross per 24 hour  Intake 2237.77 ml  Output 20 ml  Net 2217.77 ml    Intake/Output this shift: No intake/output data recorded.  Labs: Recent Labs    07/09/24 1310  HGB 11.2*   Recent Labs    07/09/24 1310  WBC 5.6  RBC 3.68*  HCT 35.1*  PLT 250   Recent Labs    07/09/24 1310  NA 141  K 4.0  CL 106  CO2 27  BUN 11  CREATININE 0.83  GLUCOSE 89  CALCIUM 8.6*   No results for input(s): LABPT, INR in the last 72 hours.   EXAM General - Patient is Alert and Oriented Extremity - Neurovascular intact Sensation intact distally Intact pulses distally Compartment soft Dressing/Incision - clean, dry, with a Prevena wound VAC intact. Motor Function - intact, moving foot and toes well on exam.  Straight leg raise independently  Past Medical History:  Diagnosis Date   Actinic keratosis    Arthritis    fingers   Basal cell carcinoma 12/13/2022   left upper abdomen, EDC   Carpal tunnel syndrome of left wrist    Cigarette smoker    Depression    Dyspnea    GERD (gastroesophageal reflux disease)    Grade I diastolic dysfunction    Hepatitis C 1985   took treatment   History of methicillin resistant  staphylococcus aureus (MRSA)    Hypothyroidism    Insomnia    Memory loss    Osteoarthritis of right knee    Osteoporosis    PONV (postoperative nausea and vomiting)    n/v with tonsillectomy   RBBB (right bundle branch block)    SCC (squamous cell carcinoma) 03/06/2023   left lower lateral leg, clear with bx on 06/20/23, Mohs cancelled   Squamous cell carcinoma of skin 12/27/2021   Left pretibial - EDC   Squamous cell carcinoma of skin 04/03/2024   L upper knee, EDC   Tremor    Wears dentures    full upper, partial lower    Assessment/Plan: 1 Day Post-Op Procedure(s) (LRB): DEBRIDEMENT, WOUND, WITH CLOSURE (Right) Principal Problem:   Wound dehiscence, traumatic injury repair  Estimated body mass index is 28.95 kg/m as calculated from the following:   Height as of this encounter: 5' 5 (1.651 m).   Weight as of this encounter: 78.9 kg. Advance diet Up with therapy D/C IV fluids  Discharge planning: Discharge with wound VAC in place.  Follow-up at [facility] clinic in 1 week.  Surgical bandage will be removed before discharge.  DVT Prophylaxis - Aspirin  and TED hose Weight-Bearing as tolerated to right leg  Krystal Doyne, PA-C Orthopaedic Surgery 07/10/2024, 7:16 AM

## 2024-07-10 NOTE — Evaluation (Signed)
 Occupational Therapy Evaluation Patient Details Name: [name] [name] MRN: [id number] DOB: 05-29-1956 Today's Date: 07/10/2024   History of Present Illness   68 y.o. 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 Patient reports that she tripped and fell, landing on her right knee which caused her surgical incision to open up. Now s/p irrigation, debridement, and closure of the knee wound on 11/25.     Clinical Impressions Pt was seen for OT evaluation this date. Prior to hospital admission, pt was back to ambulating without AD after initial TKA, indep with ADL. Pt lives alone in an apartment with PRN assist from friends available. She reports having slipped on leaves outside and falling on her R knee. Pt presents with deficits in RLE strength, pain, and ROM, mild balance deficits, affecting safe and optimal ADL completion. Pt currently requires increased time/effort to complete bed mobility, supv for STS with RW, and for short ambulation in room. A bit impulsive with standing but no overt LOB noted. Pt educated in falls prevention and home/routines modifications. Pt verbalized understanding. Do not anticipate the need for follow up OT services upon acute hospital DC.    If plan is discharge home, recommend the following:   A little help with bathing/dressing/bathroom;Assistance with cooking/housework;Assist for transportation;Help with stairs or ramp for entrance     Functional Status Assessment   Patient has had a recent decline in their functional status and demonstrates the ability to make significant improvements in function in a reasonable and predictable amount of time.     Equipment Recommendations   None recommended by OT     Recommendations for Other Services         Precautions/Restrictions   Precautions Precautions: Fall;Knee Precaution Booklet Issued: No Recall of Precautions/Restrictions:  Intact Restrictions Weight Bearing Restrictions Per Provider Order: Yes RLE Weight Bearing Per Provider Order: Weight bearing as tolerated     Mobility Bed Mobility Overal bed mobility: Modified Independent                  Transfers Overall transfer level: Needs assistance Equipment used: Rolling walker (2 wheels) Transfers: Sit to/from Stand Sit to Stand: Supervision           General transfer comment: gets up a bit impulsively but steady      Balance Overall balance assessment: Needs assistance Sitting-balance support: No upper extremity supported, Feet supported Sitting balance-Leahy Scale: Good     Standing balance support: Bilateral upper extremity supported, During functional activity Standing balance-Leahy Scale: Good                             ADL either performed or assessed with clinical judgement   ADL Overall ADL's : Modified independent                                       General ADL Comments: Pt denies difficulty with ADL, grossly supv for LB ADL for evaluation. Encouraged reacher use at home to support dressing. Difficulty wiht RLE 2/2 bandaging limiting ROM     Vision         Perception         Praxis         Pertinent Vitals/Pain Pain Assessment Pain Assessment: 0-10 Pain Score: 7  Pain Location:  R knee Pain Descriptors / Indicators: Aching Pain Intervention(s): Monitored during session, Limited activity within patient's tolerance, Repositioned, Other (comment) (RN aware, pt requesting to wait for pain meds until after breakfast)     Extremity/Trunk Assessment Upper Extremity Assessment Upper Extremity Assessment: Overall WFL for tasks assessed   Lower Extremity Assessment Lower Extremity Assessment: RLE deficits/detail RLE Deficits / Details: s/p TKA Oct, now s/p I&D and wound closure       Communication Communication Communication: No apparent difficulties   Cognition Arousal:  Alert Behavior During Therapy: WFL for tasks assessed/performed, Impulsive Cognition: No apparent impairments             OT - Cognition Comments: mildly impulsive                 Following commands: Intact       Cueing  General Comments   Cueing Techniques: Verbal cues  wound vac in place   Exercises Other Exercises Other Exercises: Pt edu in falls prevention, home/routines modifications   Shoulder Instructions      Home Living Family/patient expects to be discharged to:: Private residence Living Arrangements: Alone Available Help at Discharge: Friend(s);Available PRN/intermittently Type of Home: Apartment Home Access: Stairs to enter Entrance Stairs-Number of Steps: 4 Entrance Stairs-Rails: Right;Left Home Layout: One level     Bathroom Shower/Tub: Chief Strategy Officer: Standard     Home Equipment: Agricultural Consultant (2 wheels)          Prior Functioning/Environment Prior Level of Function : Independent/Modified Independent;Driving;History of Falls (last six months)             Mobility Comments: has been ambulating at home without RW more recently following R TKA, endorses fall slipping on leaves outside, 1 other fall prior to initial TKA on Oct ADLs Comments: indep, has a cat    OT Problem List: Decreased strength;Decreased range of motion;Pain;Impaired balance (sitting and/or standing)   OT Treatment/Interventions:        OT Goals(Current goals can be found in the care plan section)   Acute Rehab OT Goals Patient Stated Goal: go home OT Goal Formulation: All assessment and education complete, DC therapy   OT Frequency:       Co-evaluation              AM-PAC OT 6 Clicks Daily Activity     Outcome Measure Help from another person eating meals?: None Help from another person taking care of personal grooming?: None Help from another person toileting, which includes using toliet, bedpan, or urinal?: None Help  from another person bathing (including washing, rinsing, drying)?: A Little Help from another person to put on and taking off regular upper body clothing?: None Help from another person to put on and taking off regular lower body clothing?: A Little 6 Click Score: 22   End of Session Equipment Utilized During Treatment: Rolling walker (2 wheels)  Activity Tolerance: Patient tolerated treatment well Patient left: in chair;with call bell/phone within reach;with chair alarm set  OT Visit Diagnosis: Other abnormalities of gait and mobility (R26.89);Pain Pain - Right/Left: Right Pain - part of body: Knee                Time: 9170-9158 OT Time Calculation (min): 12 min Charges:  OT General Charges $OT Visit: 1 Visit OT Evaluation $OT Eval Low Complexity: 1 Low  Warren SAUNDERS., MPH, MS, OTR/L ascom [phone] 07/10/24, 8:59 AM

## 2024-07-10 NOTE — Evaluation (Signed)
 Physical Therapy Evaluation Patient Details Name: [name] [name] MRN: [id number] DOB: 08-12-1956 Today's Date: 07/10/2024  History of Present Illness  [name] [name] is a 68yoF who [facility] after a fall onto Right post surgical knee, TKA 06/03/24, incision dehiscence. Pt taken to OR with Dr. Hooten same day for I&D , wound closure. Pt is WBAT post procedure.  Clinical Impression  [name] [name] up in chair on arrival, pt says still waiting for pain meds, currently at 9/10, RN aware. Pt agreeable to PT assessment. Transfers well, moderate effort, no LOB, no grimace, able to achieve balance and weightbearing without obvious difficulty. AMB attempt remarkably slow, pain limited, not safe or functional for  basic household distances upon DC. OT reports AMB better hours earlier when pain was rated 6-7/10. Session ended, will return later in day once pain control is at goal, attempt mobility again.       If plan is discharge home, recommend the following: Assist for transportation;Assistance with cooking/housework;Help with stairs or ramp for entrance   Can travel by private vehicle        Equipment Recommendations None recommended by PT  Recommendations for Other Services       Functional Status Assessment Patient has had a recent decline in their functional status and demonstrates the ability to make significant improvements in function in a reasonable and predictable amount of time.     Precautions / Restrictions Precautions Precautions: Fall;Knee Precaution Booklet Issued: No Recall of Precautions/Restrictions: Intact Restrictions RLE Weight Bearing Per Provider Order: Weight bearing as tolerated      Mobility  Bed Mobility                    Transfers Overall transfer level: Needs assistance Equipment used: Rolling walker (2 wheels) Transfers: Sit to/from Stand Sit to Stand: Supervision, Contact guard assist           General transfer comment: no dizziyness,  lob    Ambulation/Gait   Gait Distance (Feet): 8 Feet Assistive device: Rolling walker (2 wheels) Gait Pattern/deviations: Step-to pattern       General Gait Details: steo length ~2, covers 4 ft in ~2.5 minutes, not functionally useful for safe household distance tasks; pt waiting on pain meds.  Stairs            Wheelchair Mobility     Tilt Bed    Modified Rankin (Stroke Patients Only)       Balance Overall balance assessment: Needs assistance Sitting-balance support: No upper extremity supported, Feet supported Sitting balance-Leahy Scale: Good     Standing balance support: Bilateral upper extremity supported, During functional activity Standing balance-Leahy Scale: Good                               Pertinent Vitals/Pain Pain Assessment Pain Assessment: 0-10 Pain Score: 9  Pain Location: R knee Pain Descriptors / Indicators: Aching    Home Living Family/patient expects to be discharged to:: Private residence Living Arrangements: Alone Available Help at Discharge: Friend(s);Available PRN/intermittently Type of Home: Apartment Home Access: Stairs to enter Entrance Stairs-Rails: Doctor, General Practice of Steps: 4   Home Layout: One level Home Equipment: Agricultural Consultant (2 wheels)      Prior Function Prior Level of Function : Independent/Modified Independent;Driving;History of Falls (last six months)             Mobility Comments: has been ambulating at home without RW  more recently following R TKA, endorses fall slipping on leaves outside, 1 other fall prior to initial TKA on Oct ADLs Comments: indep, has a Presenter, Broadcasting Comments      Exercises     Assessment/Plan    PT Assessment Patient needs continued PT services  PT Problem List Decreased  strength;Decreased range of motion;Decreased activity tolerance;Decreased balance;Decreased mobility       PT Treatment Interventions DME instruction;Stair training;Gait training;Functional mobility training;Therapeutic activities;Therapeutic exercise;Balance training;Patient/family education    PT Goals (Current goals can be found in the Care Plan section)  Acute Rehab PT Goals Patient Stated Goal: desires to DC today,not stay overnigth again PT Goal Formulation: With patient Time For Goal Achievement: 07/24/24 Potential to Achieve Goals: Good    Frequency Min 3X/week     Co-evaluation               AM-PAC PT 6 Clicks Mobility  Outcome Measure Help needed turning from your back to your side while in a flat bed without using bedrails?: A Little Help needed moving from lying on your back to sitting on the side of a flat bed without using bedrails?: A Little Help needed moving to and from a bed to a chair (including a wheelchair)?: A Little Help needed standing up from a chair using your arms (e.g., wheelchair or bedside chair)?: A Little Help needed to walk in hospital room?: A Little Help needed climbing 3-5 steps with a railing? : A Little 6 Click Score: 18    End of Session Equipment Utilized During Treatment: Gait belt Activity Tolerance: Patient tolerated treatment well;No increased pain;Patient limited by fatigue Patient left: in chair;with call bell/phone within reach;with chair alarm set Nurse Communication: Mobility status PT Visit Diagnosis: Difficulty in walking, not elsewhere classified (R26.2);History of falling (Z91.81)    Time: 8986-8975 PT Time Calculation (min) (ACUTE ONLY): 11 min   Charges:   PT Evaluation $PT Eval Moderate Complexity: 1 Mod   PT General Charges $$ ACUTE PT VISIT: 1 Visit       1:46 PM, 07/10/24 [name] [name], PT, DPT Physical Therapist - [facility]  [phone] (ASCOM)     [name] [name] 07/10/2024, 1:44 PM

## 2024-07-10 NOTE — Plan of Care (Signed)

## 2024-07-10 NOTE — Progress Notes (Signed)
 Physical Therapy Treatment Patient Details Name: [name] [name] MRN: [id number] DOB: [birth year] Today's Date: 07/10/2024   History of Present Illness [name] [name] is a 68yoF who comes to [facility] on 07/09/24 after a fall onto Right post surgical knee, TKA 06/03/24, incision dehiscence. Pt taken to OR with Dr. Hooten same [name] for I&D , wound closure. Pt is WBAT post procedure.    PT Comments  Author returns to room after pain meds in place, pt reports to feel 'groggy' from meds, asks for more time to feel more alert. 1 hour later, pt reports feeling optimal for PT attempt, pain in 0/10. Meal eaten. Pt able to AMB 179ft with SPC, less than household distance pacing, but pacing faster than typical performance on POD1 TKA. Pt able to demonstrate 8 stairs, 85% correct step sequencing, limited awareness of coming down 3rd step with eccentric surgical limb, then concentric reversal as to perform this step again with the intended leg instead- pain meds clearly working. Pt denies any dizziness or imbalance with AMB, reports readiness for DC to home, confidence in safe home navigation at DC. Will continue to follow.    If plan is discharge home, recommend the following: Assist for transportation;Assistance with cooking/housework;Help with stairs or ramp for entrance   Can travel by private vehicle        Equipment Recommendations  None recommended by PT    Recommendations for Other Services       Precautions / Restrictions Precautions Precautions: Fall;Knee Precaution Booklet Issued: No Recall of Precautions/Restrictions: Intact Restrictions RLE Weight Bearing Per Provider Order: Weight bearing as tolerated     Mobility  Bed Mobility                    Transfers Overall transfer level: Needs assistance Equipment used: None (standing before walker is provided) Transfers: Sit to/from Stand Sit to Stand: Supervision           General transfer comment: no dizzyness, no lob     Ambulation/Gait Ambulation/Gait assistance: Contact guard assist Gait Distance (Feet): 120 Feet Assistive device: Rolling walker (2 wheels) Gait Pattern/deviations: Step-to pattern       General Gait Details: 0.45m/s   Stairs Stairs: Yes Stairs assistance: Contact guard assist Stair Management: Two rails, Step to pattern Number of Stairs: 8 General stair comments: able to cite recommended pattern without cues, but does have pattern error on descending warrants reminder from author.   Wheelchair Mobility     Tilt Bed    Modified Rankin (Stroke Patients Only)       Balance Overall balance assessment: Needs assistance Sitting-balance support: No upper extremity supported, Feet supported Sitting balance-Leahy Scale: Good     Standing balance support: Bilateral upper extremity supported, During functional activity Standing balance-Leahy Scale: Good                              Communication    Cognition                                        Cueing    Exercises      General Comments        Pertinent Vitals/Pain Pain Assessment Pain Assessment: 0-10 Pain Score: 9  Pain Location: R knee Pain Descriptors / Indicators: Aching    Home Living Family/patient expects to be discharged  to:: Private residence Living Arrangements: Alone Available Help at Discharge: Friend(s);Available PRN/intermittently Type of Home: Apartment Home Access: Stairs to enter Entrance Stairs-Rails: Doctor, General Practice of Steps: 4   Home Layout: One level Home Equipment: Agricultural Consultant (2 wheels)      Prior Function            PT Goals (current goals can now be found in the care plan section) Acute Rehab PT Goals Patient Stated Goal: desires to DC today,not stay overnigth again PT Goal Formulation: With patient Time For Goal Achievement: 07/24/24 Potential to Achieve Goals: Good    Frequency    Min 3X/week      PT Plan       Co-evaluation              AM-PAC PT 6 Clicks Mobility   Outcome Measure  Help needed turning from your back to your side while in a flat bed without using bedrails?: A Little Help needed moving from lying on your back to sitting on the side of a flat bed without using bedrails?: A Little Help needed moving to and from a bed to a chair (including a wheelchair)?: A Little Help needed standing up from a chair using your arms (e.g., wheelchair or bedside chair)?: A Little Help needed to walk in hospital room?: A Little Help needed climbing 3-5 steps with a railing? : A Little 6 Click Score: 18    End of Session Equipment Utilized During Treatment: Gait belt Activity Tolerance: Patient tolerated treatment well;No increased pain;Patient limited by fatigue Patient left: in chair;with call bell/phone within reach;with chair alarm set Nurse Communication: Mobility status PT Visit Diagnosis: Difficulty in walking, not elsewhere classified (R26.2);History of falling (Z91.81)     Time: 8697-8674 PT Time Calculation (min) (ACUTE ONLY): 23 min  Charges:    $Gait Training: 23-37 mins PT General Charges $$ ACUTE PT VISIT: 1 Visit                    1:54 PM, 07/10/24 [name], PT, DPT Physical Therapist - [facility]  [phone] (ASCOM)    [name] [name] 07/10/2024, 1:50 PM

## 2024-07-10 NOTE — Discharge Summary (Signed)
 Physician Discharge Summary  Subjective: 1 Day Post-Op Procedure(s) (LRB): DEBRIDEMENT, WOUND, WITH CLOSURE (Right) Patient reports pain as mild.   Patient seen in rounds with Dr. Mardee. Patient is well, and has had no acute complaints or problems Patient is ready to go home after physical therapy  Physician Discharge Summary  Patient ID: [name] [name] MRN: [id number] DOB/AGE: [birth year] 68 y.o.  Admit date: 07/09/2024 Discharge date: 07/10/2024  Admission Diagnoses:  Discharge Diagnoses:  Principal Problem:   Wound dehiscence, traumatic injury repair   Discharged Condition: fair  Hospital Course: The patient is postop day 1 from a debridement and wound closure done on the right total knee incision by Dr. Mardee.  She is doing well this morning.  She has a wound VAC in place.  She is able to straight leg raise independently.  Her vitals are stable.  She has stable labs.  She will do physical therapy this morning before being discharged home with the wound VAC in place.  She will need to complete her antibiotic therapy.  Treatments: surgery:  Irrigation, debridement, and closure of the traumatic dehiscence of the right knee incision   SURGEON:  Lynwood SHAUNNA Mardee Mickey., M.D.           ASSISTANT: Irrigation, debridement, and closure of the traumatic dehiscence of the right knee wound    ANESTHESIA: general   ESTIMATED BLOOD LOSS: 10 mL   FLUIDS REPLACED: 900 mL of crystalloid   TOURNIQUET TIME: 28    DRAINS: Wound VAC  Discharge Exam: Blood pressure 108/65, pulse 76, temperature 98 F (36.7 C), temperature source Oral, resp. rate 17, height 5' 5 (1.651 m), weight 78.9 kg, SpO2 94%.   Disposition: Discharge disposition: 01-Home or Self Care        Allergies as of 07/10/2024       Reactions   Sonata [zaleplon]    Took with another medication with this and it caused hallucinations.   Carbidopa-levodopa Nausea Only        Medication List     TAKE these  medications    acetaminophen  650 MG CR tablet Commonly known as: TYLENOL  Take 1,300 mg by mouth every 8 (eight) hours as needed for pain.   Tylenol  325 MG tablet Generic drug: acetaminophen  Take 650 mg by mouth 4 (four) times daily.   amitriptyline  50 MG tablet Commonly known as: ELAVIL  Take 150 mg by mouth at bedtime.   ARIPiprazole  5 MG tablet Commonly known as: ABILIFY  Take 5 mg by mouth See admin instructions. Take with 10 mg for a total of 15 mg daily   ARIPiprazole  10 MG tablet Commonly known as: ABILIFY  Take 10 mg by mouth See admin instructions. Take with 5 mg for a total of 15 mg daily-AM   aspirin  81 MG chewable tablet Chew 1 tablet (81 mg total) by mouth 2 (two) times daily.   buPROPion  150 MG 12 hr tablet Commonly known as: WELLBUTRIN  SR Take 150-300 mg by mouth See admin instructions. Take 300 mg in the morning and 150 mg at bedtime   busPIRone  10 MG tablet Commonly known as: BUSPAR  Take 10 mg by mouth 3 (three) times daily.   CALCIUM CARBONATE PO Take 500 mg by mouth daily as needed (Heartburn/Acid reflux).   celecoxib  200 MG capsule Commonly known as: CELEBREX  Take 1 capsule (200 mg total) by mouth 2 (two) times daily.   donepezil  10 MG tablet Commonly known as: ARICEPT  Take 10 mg by mouth at bedtime.   Dry  Eye Relief Drops 0.2-0.2-1 % Soln Generic drug: Glycerin-Hypromellose-PEG 400 Place 1 drop into both eyes daily as needed (Dry eye). bausch and lomb   esomeprazole  20 MG capsule Commonly known as: NEXIUM  Take 20 mg by mouth daily as needed (Acid reflux).   Forteo 560 MCG/2.24ML Sopn Generic drug: Teriparatide Inject 20 mcg into the skin daily.   hydrOXYzine 25 MG tablet Commonly known as: ATARAX Take 25 mg by mouth daily as needed.   K[employer] Pen Needles 31G X 6 MM Misc Generic drug: Insulin Pen Needle as directed.   levothyroxine  112 MCG tablet Commonly known as: SYNTHROID  Take 112 mcg by mouth daily before breakfast.   memantine  10  MG tablet Commonly known as: NAMENDA  Take 10 mg by mouth See admin instructions. Take with 5 mg for a total of 15 mg twice a day   memantine  5 MG tablet Commonly known as: NAMENDA  Take 5 mg by mouth See admin instructions. Take with 10 mg for a total of 15 mg twice a day   methocarbamol 500 MG tablet Commonly known as: ROBAXIN Take 500 mg by mouth daily as needed.   Multivitamin Adult (Minerals) Tabs Take 1 tablet by mouth daily.   Myrbetriq  25 MG Tb24 tablet Generic drug: mirabegron  ER Take 25 mg by mouth daily after lunch.   nystatin  cream Commonly known as: MYCOSTATIN  Apply to affected areas rash in skin folds What changed:  how much to take how to take this when to take this reasons to take this additional instructions   oxyCODONE  5 MG immediate release tablet Commonly known as: Oxy IR/ROXICODONE  Take 1 tablet (5 mg total) by mouth every 4 (four) hours as needed for moderate pain (pain score 4-6) (pain score 4-6).   traMADol  50 MG tablet Commonly known as: ULTRAM  Take 1-2 tablets (50-100 mg total) by mouth every 6 (six) hours as needed for moderate pain (pain score 4-6). What changed:  how much to take reasons to take this   venlafaxine  XR 150 MG 24 hr capsule Commonly known as: EFFEXOR -XR Take 300 mg by mouth daily with breakfast.        Follow-up Information     Costa, Juliana V, PA Follow up in 1 week(s).   Specialty: Orthopedic Surgery Why: For wound check and wound VAC removal Contact information: [address] [place] [address] [phone]                 Signed: VERLINDA, Brinley Rosete 07/10/2024, 7:23 AM   Objective: Vital signs in last 24 hours: Temp:  [96.6 F (35.9 C)-98.6 F (37 C)] 98 F (36.7 C) (11/26 0500) Pulse Rate:  [66-91] 76 (11/26 0500) Resp:  [11-23] 17 (11/26 0500) BP: (108-165)/(64-100) 108/65 (11/26 0500) SpO2:  [90 %-100 %] 94 % (11/26 0500) Weight:  [78.9 kg] 78.9 kg (11/25 1446)  Intake/Output from  previous day:  Intake/Output Summary (Last 24 hours) at 07/10/2024 0723 Last data filed at 07/10/2024 9376 Gross per 24 hour  Intake 2237.77 ml  Output 20 ml  Net 2217.77 ml    Intake/Output this shift: No intake/output data recorded.  Labs: Recent Labs    07/09/24 1310  HGB 11.2*   Recent Labs    07/09/24 1310  WBC 5.6  RBC 3.68*  HCT 35.1*  PLT 250   Recent Labs    07/09/24 1310  NA 141  K 4.0  CL 106  CO2 27  BUN 11  CREATININE 0.83  GLUCOSE 89  CALCIUM 8.6*  No results for input(s): LABPT, INR in the last 72 hours.  EXAM: General - Patient is Alert and Oriented Extremity - Neurovascular intact Sensation intact distally Dorsiflexion/Plantar flexion intact Compartment soft Incision - clean, dry, with the wound VAC and surgical bandage in place Motor Function -plantarflexion and dorsiflexion intact.  Able to straight leg raise independently.  Assessment/Plan: 1 Day Post-Op Procedure(s) (LRB): DEBRIDEMENT, WOUND, WITH CLOSURE (Right) Procedure(s) (LRB): DEBRIDEMENT, WOUND, WITH CLOSURE (Right) Past Medical History:  Diagnosis Date   Actinic keratosis    Arthritis    fingers   Basal cell carcinoma 12/13/2022   left upper abdomen, EDC   Carpal tunnel syndrome of left wrist    Cigarette smoker    Depression    Dyspnea    GERD (gastroesophageal reflux disease)    Grade I diastolic dysfunction    Hepatitis C 1985   took treatment   History of methicillin resistant staphylococcus aureus (MRSA)    Hypothyroidism    Insomnia    Memory loss    Osteoarthritis of right knee    Osteoporosis    PONV (postoperative nausea and vomiting)    n/v with tonsillectomy   RBBB (right bundle branch block)    SCC (squamous cell carcinoma) 03/06/2023   left lower lateral leg, clear with bx on 06/20/23, Mohs cancelled   Squamous cell carcinoma of skin 12/27/2021   Left pretibial - EDC   Squamous cell carcinoma of skin 04/03/2024   L upper knee, EDC    Tremor    Wears dentures    full upper, partial lower   Principal Problem:   Wound dehiscence, traumatic injury repair  Estimated body mass index is 28.95 kg/m as calculated from the following:   Height as of this encounter: 5' 5 (1.651 m).   Weight as of this encounter: 78.9 kg. Advance diet Up with therapy D/C IV fluids after completing antibiotic therapy Diet - Regular diet Follow up - in 1 week Activity - WBAT Disposition - Home Condition Upon Discharge - Stable DVT Prophylaxis - Aspirin   Krystal Doyne, PA-C Orthopaedic Surgery 07/10/2024, 7:23 AM

## 2024-07-18 NOTE — Anesthesia Postprocedure Evaluation (Signed)
 Anesthesia Post Note  Patient: [name] [name]  Procedure(s) Performed: DEBRIDEMENT, WOUND, WITH CLOSURE (Right)  Patient location during evaluation: PACU Anesthesia Type: General Level of consciousness: awake and alert Pain management: pain level controlled Vital Signs Assessment: post-procedure vital signs reviewed and stable Respiratory status: spontaneous breathing, nonlabored ventilation, respiratory function stable and patient connected to nasal cannula oxygen Cardiovascular status: blood pressure returned to baseline and stable Postop Assessment: no apparent nausea or vomiting Anesthetic complications: no   No notable events documented.   Last Vitals:  Vitals:   07/10/24 1121 07/10/24 1423  BP: 113/74 102/71  Pulse: 78 85  Resp: 18 17  Temp: 36.5 C 36.4 C  SpO2: 94% 98%    Last Pain:  Vitals:   07/10/24 1423  TempSrc: Oral  PainSc:                  Prentice Murphy

## 2024-08-27 ENCOUNTER — Ambulatory Visit: Admitting: Dermatology

## 2024-08-27 DIAGNOSIS — Z7189 Other specified counseling: Secondary | ICD-10-CM

## 2024-08-27 DIAGNOSIS — L609 Nail disorder, unspecified: Secondary | ICD-10-CM

## 2024-08-27 DIAGNOSIS — L739 Follicular disorder, unspecified: Secondary | ICD-10-CM

## 2024-08-27 DIAGNOSIS — B351 Tinea unguium: Secondary | ICD-10-CM

## 2024-08-27 MED ORDER — MUPIROCIN 2 % EX OINT: TOPICAL_OINTMENT | CUTANEOUS | 0 refills | Status: AC

## 2024-08-27 MED ORDER — TERBINAFINE HCL 250 MG PO TABS: ORAL_TABLET | ORAL | 0 refills | Status: AC

## 2024-08-27 NOTE — Patient Instructions (Addendum)
 Terbinafine  Counseling  Terbinafine  is an anti-fungal medicine that can be applied to the skin (over the counter) or taken by mouth (prescription) to treat fungal infections. The pill version is often used to treat fungal infections of the nails or scalp. While most people do not have any side effects from taking terbinafine  pills, some possible side effects of the medicine can include taste changes, headache, loss of smell, vision changes, nausea, vomiting, or diarrhea.   Rare side effects can include irritation of the liver, allergic reaction, or decrease in blood counts (which may show up as not feeling well or developing an infection). If you are concerned about any of these side effects, please stop the medicine and call your doctor, or in the case of an emergency such as feeling very unwell, seek immediate medical care.   Start terbinafine  250 mg - take 1 tablet daily with food. Have labs done in 1 month for 2 additional refills of terbinafine .   Due to recent changes in healthcare laws, you may see results of your pathology and/or laboratory studies on MyChart before the doctors have had a chance to review them. We understand that in some cases there may be results that are confusing or concerning to you. Please understand that not all results are received at the same time and often the doctors may need to interpret multiple results in order to provide you with the best plan of care or course of treatment. Therefore, we ask that you please give us  2 business days to thoroughly review all your results before contacting the office for clarification. Should we see a critical lab result, you will be contacted sooner.   If You Need Anything After Your Visit  If you have any questions or concerns for your doctor, please call our main line at [phone] and press option 4 to reach your doctor's medical assistant. If no one answers, please leave a voicemail as directed and we will return your call as  soon as possible. Messages left after 4 pm will be answered the following business day.   You may also send us  a message via MyChart. We typically respond to MyChart messages within 1-2 business days.  For prescription refills, please ask your pharmacy to contact our office. Our fax number is [phone].  If you have an urgent issue when the clinic is closed that cannot wait until the next business day, you can page your doctor at the number below.    Please note that while we do our best to be available for urgent issues outside of office hours, we are not available 24/7.   If you have an urgent issue and are unable to reach us , you may choose to seek medical care at your doctor's office, retail clinic, urgent care center, or emergency room.  If you have a medical emergency, please immediately call 911 or go to the emergency department.  Pager Numbers  - Dr. Hester: [phone]  - Dr. Jackquline: [phone]  - Dr. Claudene: [phone]   - Dr. Raymund: [phone]  In the event of inclement weather, please call our main line at [phone] for an update on the status of any delays or closures.  Dermatology Medication Tips: Please keep the boxes that topical medications come in in order to help keep track of the instructions about where and how to use these. Pharmacies typically print the medication instructions only on the boxes and not directly on the medication tubes.   If your medication is too  expensive, please contact our office at [phone] option 4 or send us  a message through MyChart.   We are unable to tell what your co-pay for medications will be in advance as this is different depending on your insurance coverage. However, we may be able to find a substitute medication at lower cost or fill out paperwork to get insurance to cover a needed medication.   If a prior authorization is required to get your medication covered by your insurance company, please allow us  1-2  business days to complete this process.  Drug prices often vary depending on where the prescription is filled and some pharmacies may offer cheaper prices.  The website www.goodrx.com contains coupons for medications through different pharmacies. The prices here do not account for what the cost may be with help from insurance (it may be cheaper with your insurance), but the website can give you the price if you did not use any insurance.  - You can print the associated coupon and take it with your prescription to the pharmacy.  - You may also stop by our office during regular business hours and pick up a GoodRx coupon card.  - If you need your prescription sent electronically to a different pharmacy, notify our office through [facility] or by phone at [phone] option 4.     Si Usted Necesita Algo Despus de Su Visita  Tambin puede enviarnos un mensaje a travs de Clinical Cytogeneticist. Por lo general respondemos a los mensajes de MyChart en el transcurso de 1 a 2 das hbiles.  Para renovar recetas, por favor pida a su farmacia que se ponga en contacto con nuestra oficina. Randi lakes de fax es [place] [phone].  Si tiene un asunto urgente cuando la clnica est cerrada y que no puede esperar hasta el siguiente da hbil, puede llamar/localizar a su doctor(a) al nmero que aparece a continuacin.   Por favor, tenga en cuenta que aunque hacemos todo lo posible para estar disponibles para asuntos urgentes fuera del horario de [place], no estamos disponibles las 24 horas del da, los 7 [address]  [address] de la [place].   Si tiene un problema urgente y no puede comunicarse con nosotros, puede optar por buscar atencin mdica  en el consultorio de su doctor(a), en una clnica privada, en un centro de atencin urgente o en una sala de emergencias.  Si tiene engineer, drilling, por favor llame inmediatamente al 911 o vaya a la sala de emergencias.  Nmeros de bper  - Dr. Hester: [phone]  - Dra.  Jackquline: [phone]  - Dr. Claudene: [phone]  - Dra. Kitts: [phone]  En caso de inclemencias del [place], por favor llame a nuestra lnea principal al [phone] para una actualizacin sobre el estado de cualquier retraso o cierre.  Consejos para la medicacin en dermatologa: Por favor, guarde las cajas en las que vienen los medicamentos de uso tpico para ayudarle a seguir las instrucciones sobre dnde y cmo usarlos. Las farmacias generalmente imprimen las instrucciones del medicamento slo en las cajas y no directamente en los tubos del [place].   Si su medicamento es muy caro, por favor, pngase en contacto con landry rieger llamando al [phone] y presione la opcin 4 o envenos un mensaje a travs de Clinical Cytogeneticist.   No podemos decirle cul ser su copago por los medicamentos por adelantado ya que esto es diferente dependiendo de la cobertura de su seguro. Sin embargo, es posible que podamos encontrar un medicamento sustituto a therapist, occupational  formulario para que el seguro cubra el medicamento que se considera necesario.   Si se requiere una autorizacin previa para que su compaa de seguros cubra su medicamento, por favor permtanos de 1 a 2 das hbiles para completar este proceso.  Los precios de los medicamentos varan con frecuencia dependiendo del environmental consultant de dnde se surte la receta y alguna farmacias pueden ofrecer precios ms baratos.  El sitio web www.goodrx.com tiene cupones para medicamentos de health and safety inspector. Los precios aqu no tienen en cuenta lo que podra costar con la ayuda del seguro (puede ser ms barato con su seguro), pero el sitio web puede darle el precio si no utiliz tourist information centre manager.  - Puede imprimir el cupn correspondiente y llevarlo con su receta a la farmacia.  - Tambin puede pasar por nuestra oficina durante el horario de atencin regular y education officer, museum una tarjeta de cupones de GoodRx.  - Si necesita que su receta se enve  electrnicamente a una farmacia diferente, informe a nuestra oficina a travs de MyChart de [place] o por telfono llamando al [phone] y presione la opcin 4.

## 2024-08-27 NOTE — Progress Notes (Signed)
" ° °  Follow-Up Visit   Subjective  [name] [name] is a 69 y.o. female who presents for the following: f/u thickened toenails, PCR test negative for fungus. Not currently using any meds or topicals to nails. Toenails with polish today. Patient states she was on an oral medicine from her PCP years ago that almost cleared nails. She has taken fluconazole  in the past with no improvement.  Patient with a pink spot to check on the left lower leg, noticed a few days ago. No itch or pain.   The patient has spots, moles and lesions to be evaluated, some may be new or changing.   The following portions of the chart were reviewed this encounter and updated as appropriate: medications, allergies, medical history  Review of Systems:  No other skin or systemic complaints except as noted in HPI or Assessment and Plan.  Objective  Well appearing patient in no apparent distress; mood and affect are within normal limits.  A focused examination was performed of the following areas: Toenails, left leg  Relevant physical exam findings are noted in the Assessment and Plan.    Assessment & Plan  FOLLICULITIS Exam: crusted pink papule with surrounding erythema at the left pretibia.  Folliculitis occurs due to inflammation of the superficial hair follicle (pore), resulting in acne-like lesions (pus bumps). It can be infectious (bacterial, fungal) or noninfectious (shaving, tight clothing, heat/sweat, medications).  Folliculitis can be acute or chronic and recommended treatment depends on the underlying cause of folliculitis.  Treatment Plan: Start mupirocin  2% ointment twice daily until healed.  NAIL PROBLEM Fungal ID nail testing negative for fungus.  Exam: Thickening of the toenails, nail polish on, will use prior photos for baseline  Chronic and persistent condition with duration or expected duration over one year. Condition is symptomatic/ bothersome to patient. Not currently at goal.   Treatment  Plan: Pt wants to do trial of oral terbinafine , since she thinks she did well with that one in the past CMP reviewed from 04/01/2024, wnl. Start terbinafine  250 mg take 1 po every day with food dsp #30 0Rf. Labs slip given to patient to have done in 1 month.   Terbinafine  Counseling  Terbinafine  is an anti-fungal medicine that can be applied to the skin (over the counter) or taken by mouth (prescription) to treat fungal infections. The pill version is often used to treat fungal infections of the nails or scalp. While most people do not have any side effects from taking terbinafine  pills, some possible side effects of the medicine can include taste changes, headache, loss of smell, vision changes, nausea, vomiting, or diarrhea.   Rare side effects can include irritation of the liver, allergic reaction, or decrease in blood counts (which may show up as not feeling well or developing an infection). If you are concerned about any of these side effects, please stop the medicine and call your doctor, or in the case of an emergency such as feeling very unwell, seek immediate medical care.    TINEA UNGUIUM   This Visit - Hepatic Function Panel   Return in about 3 months (around 11/25/2024) for recheck toenails.  IAndrea Kerns, CMA, am acting as scribe for Rexene Rattler, MD .   Documentation: I have reviewed the above documentation for accuracy and completeness, and I agree with the above.  Rexene Rattler, MD    "

## 2024-11-26 ENCOUNTER — Ambulatory Visit: Admitting: Dermatology
